# Patient Record
Sex: Female | Born: 1940 | Race: Black or African American | Hispanic: No | Smoking: Never smoker
Health system: Southern US, Community
[De-identification: ages and names within clinical notes are randomized; demographics above are authoritative.]

## PROBLEM LIST (undated history)

## (undated) ENCOUNTER — Ambulatory Visit (HOSPITAL_COMMUNITY): Admission: EM | Payer: Medicare HMO

## (undated) DIAGNOSIS — I4891 Unspecified atrial fibrillation: Secondary | ICD-10-CM

## (undated) DIAGNOSIS — R413 Other amnesia: Secondary | ICD-10-CM

## (undated) DIAGNOSIS — N281 Cyst of kidney, acquired: Secondary | ICD-10-CM

## (undated) DIAGNOSIS — F419 Anxiety disorder, unspecified: Secondary | ICD-10-CM

## (undated) DIAGNOSIS — I10 Essential (primary) hypertension: Secondary | ICD-10-CM

## (undated) DIAGNOSIS — Z0000 Encounter for general adult medical examination without abnormal findings: Secondary | ICD-10-CM

## (undated) DIAGNOSIS — F39 Unspecified mood [affective] disorder: Secondary | ICD-10-CM

## (undated) DIAGNOSIS — E785 Hyperlipidemia, unspecified: Secondary | ICD-10-CM

## (undated) HISTORY — DX: Encounter for general adult medical examination without abnormal findings: Z00.00

## (undated) HISTORY — DX: Essential (primary) hypertension: I10

## (undated) HISTORY — DX: Hyperlipidemia, unspecified: E78.5

## (undated) HISTORY — PX: ABDOMINAL HYSTERECTOMY: SHX81

## (undated) HISTORY — DX: Unspecified atrial fibrillation: I48.91

## (undated) HISTORY — DX: Anxiety disorder, unspecified: F41.9

## (undated) HISTORY — DX: Cyst of kidney, acquired: N28.1

## (undated) HISTORY — DX: Other amnesia: R41.3

## (undated) HISTORY — DX: Unspecified mood (affective) disorder: F39

---

## 1998-09-07 ENCOUNTER — Inpatient Hospital Stay (HOSPITAL_COMMUNITY): Admission: AD | Admit: 1998-09-07 | Discharge: 1998-09-07 | Payer: Self-pay | Admitting: Internal Medicine

## 1998-10-24 ENCOUNTER — Ambulatory Visit (HOSPITAL_COMMUNITY): Admission: RE | Admit: 1998-10-24 | Discharge: 1998-10-24 | Payer: Self-pay | Admitting: Obstetrics and Gynecology

## 1998-10-24 ENCOUNTER — Encounter: Payer: Self-pay | Admitting: Obstetrics and Gynecology

## 1999-07-25 ENCOUNTER — Ambulatory Visit (HOSPITAL_COMMUNITY): Admission: RE | Admit: 1999-07-25 | Discharge: 1999-07-25 | Payer: Self-pay | Admitting: Cardiology

## 1999-07-31 ENCOUNTER — Encounter: Payer: Self-pay | Admitting: Cardiology

## 1999-07-31 ENCOUNTER — Ambulatory Visit (HOSPITAL_COMMUNITY): Admission: RE | Admit: 1999-07-31 | Discharge: 1999-07-31 | Payer: Self-pay | Admitting: Cardiology

## 2000-03-14 ENCOUNTER — Ambulatory Visit (HOSPITAL_COMMUNITY): Admission: RE | Admit: 2000-03-14 | Discharge: 2000-03-14 | Payer: Self-pay | Admitting: Cardiology

## 2000-07-31 ENCOUNTER — Other Ambulatory Visit: Admission: RE | Admit: 2000-07-31 | Discharge: 2000-07-31 | Payer: Self-pay | Admitting: Obstetrics and Gynecology

## 2000-08-06 ENCOUNTER — Encounter: Payer: Self-pay | Admitting: Internal Medicine

## 2000-08-06 ENCOUNTER — Encounter: Admission: RE | Admit: 2000-08-06 | Discharge: 2000-08-06 | Payer: Self-pay | Admitting: Internal Medicine

## 2001-01-28 ENCOUNTER — Encounter: Payer: Self-pay | Admitting: Internal Medicine

## 2001-01-28 ENCOUNTER — Encounter: Admission: RE | Admit: 2001-01-28 | Discharge: 2001-01-28 | Payer: Self-pay | Admitting: Internal Medicine

## 2001-02-28 ENCOUNTER — Inpatient Hospital Stay (HOSPITAL_COMMUNITY): Admission: AD | Admit: 2001-02-28 | Discharge: 2001-03-05 | Payer: Self-pay | Admitting: Internal Medicine

## 2001-03-10 ENCOUNTER — Encounter: Admission: RE | Admit: 2001-03-10 | Discharge: 2001-06-08 | Payer: Self-pay | Admitting: Internal Medicine

## 2001-04-30 ENCOUNTER — Ambulatory Visit (HOSPITAL_COMMUNITY): Admission: RE | Admit: 2001-04-30 | Discharge: 2001-04-30 | Payer: Self-pay | Admitting: Internal Medicine

## 2001-04-30 ENCOUNTER — Encounter: Payer: Self-pay | Admitting: Internal Medicine

## 2001-05-13 ENCOUNTER — Ambulatory Visit (HOSPITAL_COMMUNITY): Admission: RE | Admit: 2001-05-13 | Discharge: 2001-05-13 | Payer: Self-pay | Admitting: Internal Medicine

## 2001-05-13 ENCOUNTER — Encounter: Payer: Self-pay | Admitting: Internal Medicine

## 2001-07-28 ENCOUNTER — Encounter: Admission: RE | Admit: 2001-07-28 | Discharge: 2001-10-26 | Payer: Self-pay | Admitting: Internal Medicine

## 2001-10-09 ENCOUNTER — Other Ambulatory Visit: Admission: RE | Admit: 2001-10-09 | Discharge: 2001-10-09 | Payer: Self-pay | Admitting: Obstetrics and Gynecology

## 2001-12-29 ENCOUNTER — Encounter: Admission: RE | Admit: 2001-12-29 | Discharge: 2002-03-29 | Payer: Self-pay | Admitting: Internal Medicine

## 2002-02-13 ENCOUNTER — Encounter: Payer: Self-pay | Admitting: Emergency Medicine

## 2002-02-13 ENCOUNTER — Emergency Department (HOSPITAL_COMMUNITY): Admission: EM | Admit: 2002-02-13 | Discharge: 2002-02-13 | Payer: Self-pay | Admitting: Emergency Medicine

## 2002-04-20 ENCOUNTER — Encounter: Admission: RE | Admit: 2002-04-20 | Discharge: 2002-07-19 | Payer: Self-pay | Admitting: Internal Medicine

## 2003-01-06 ENCOUNTER — Encounter: Payer: Self-pay | Admitting: Internal Medicine

## 2003-01-06 ENCOUNTER — Encounter: Admission: RE | Admit: 2003-01-06 | Discharge: 2003-01-06 | Payer: Self-pay | Admitting: Internal Medicine

## 2003-03-02 ENCOUNTER — Other Ambulatory Visit: Admission: RE | Admit: 2003-03-02 | Discharge: 2003-03-02 | Payer: Self-pay | Admitting: Obstetrics and Gynecology

## 2003-04-22 ENCOUNTER — Encounter: Admission: RE | Admit: 2003-04-22 | Discharge: 2003-04-22 | Payer: Self-pay | Admitting: Internal Medicine

## 2003-04-22 ENCOUNTER — Encounter: Payer: Self-pay | Admitting: Internal Medicine

## 2003-05-19 ENCOUNTER — Emergency Department (HOSPITAL_COMMUNITY): Admission: EM | Admit: 2003-05-19 | Discharge: 2003-05-19 | Payer: Self-pay | Admitting: Emergency Medicine

## 2003-05-20 ENCOUNTER — Inpatient Hospital Stay (HOSPITAL_COMMUNITY): Admission: AD | Admit: 2003-05-20 | Discharge: 2003-05-25 | Payer: Self-pay | Admitting: Internal Medicine

## 2003-05-20 ENCOUNTER — Encounter: Payer: Self-pay | Admitting: Internal Medicine

## 2003-05-22 ENCOUNTER — Encounter: Payer: Self-pay | Admitting: Internal Medicine

## 2003-05-22 ENCOUNTER — Encounter: Payer: Self-pay | Admitting: Gastroenterology

## 2003-05-23 ENCOUNTER — Encounter: Payer: Self-pay | Admitting: Internal Medicine

## 2003-07-22 ENCOUNTER — Ambulatory Visit (HOSPITAL_COMMUNITY): Admission: RE | Admit: 2003-07-22 | Discharge: 2003-07-22 | Payer: Self-pay | Admitting: Gastroenterology

## 2004-03-07 ENCOUNTER — Encounter: Admission: RE | Admit: 2004-03-07 | Discharge: 2004-03-07 | Payer: Self-pay | Admitting: Internal Medicine

## 2004-03-23 ENCOUNTER — Other Ambulatory Visit: Admission: RE | Admit: 2004-03-23 | Discharge: 2004-03-23 | Payer: Self-pay | Admitting: Obstetrics and Gynecology

## 2005-04-18 ENCOUNTER — Ambulatory Visit (HOSPITAL_COMMUNITY): Admission: RE | Admit: 2005-04-18 | Discharge: 2005-04-18 | Payer: Self-pay | Admitting: Obstetrics and Gynecology

## 2005-06-21 ENCOUNTER — Other Ambulatory Visit: Admission: RE | Admit: 2005-06-21 | Discharge: 2005-06-21 | Payer: Self-pay | Admitting: Obstetrics and Gynecology

## 2005-06-25 ENCOUNTER — Emergency Department (HOSPITAL_COMMUNITY): Admission: EM | Admit: 2005-06-25 | Discharge: 2005-06-25 | Payer: Self-pay | Admitting: Emergency Medicine

## 2005-08-28 ENCOUNTER — Emergency Department (HOSPITAL_COMMUNITY): Admission: EM | Admit: 2005-08-28 | Discharge: 2005-08-28 | Payer: Self-pay | Admitting: Emergency Medicine

## 2005-10-10 ENCOUNTER — Encounter: Admission: RE | Admit: 2005-10-10 | Discharge: 2006-01-08 | Payer: Self-pay | Admitting: Sports Medicine

## 2005-10-13 ENCOUNTER — Ambulatory Visit (HOSPITAL_COMMUNITY): Admission: RE | Admit: 2005-10-13 | Discharge: 2005-10-13 | Payer: Self-pay | Admitting: Sports Medicine

## 2006-02-04 ENCOUNTER — Encounter: Admission: RE | Admit: 2006-02-04 | Discharge: 2006-02-04 | Payer: Self-pay | Admitting: Sports Medicine

## 2006-02-10 ENCOUNTER — Emergency Department (HOSPITAL_COMMUNITY): Admission: EM | Admit: 2006-02-10 | Discharge: 2006-02-10 | Payer: Self-pay | Admitting: Emergency Medicine

## 2006-07-25 ENCOUNTER — Emergency Department (HOSPITAL_COMMUNITY): Admission: EM | Admit: 2006-07-25 | Discharge: 2006-07-25 | Payer: Self-pay | Admitting: Family Medicine

## 2006-08-04 ENCOUNTER — Emergency Department (HOSPITAL_COMMUNITY): Admission: EM | Admit: 2006-08-04 | Discharge: 2006-08-04 | Payer: Self-pay | Admitting: Emergency Medicine

## 2006-10-22 DIAGNOSIS — Z0000 Encounter for general adult medical examination without abnormal findings: Secondary | ICD-10-CM

## 2006-10-22 HISTORY — DX: Encounter for general adult medical examination without abnormal findings: Z00.00

## 2006-11-10 ENCOUNTER — Emergency Department (HOSPITAL_COMMUNITY): Admission: EM | Admit: 2006-11-10 | Discharge: 2006-11-10 | Payer: Self-pay | Admitting: Emergency Medicine

## 2007-02-08 ENCOUNTER — Emergency Department (HOSPITAL_COMMUNITY): Admission: EM | Admit: 2007-02-08 | Discharge: 2007-02-08 | Payer: Self-pay | Admitting: *Deleted

## 2007-04-28 ENCOUNTER — Emergency Department (HOSPITAL_COMMUNITY): Admission: EM | Admit: 2007-04-28 | Discharge: 2007-04-28 | Payer: Self-pay | Admitting: Family Medicine

## 2007-05-01 ENCOUNTER — Emergency Department (HOSPITAL_COMMUNITY): Admission: EM | Admit: 2007-05-01 | Discharge: 2007-05-01 | Payer: Self-pay | Admitting: Emergency Medicine

## 2007-06-17 ENCOUNTER — Emergency Department (HOSPITAL_COMMUNITY): Admission: EM | Admit: 2007-06-17 | Discharge: 2007-06-17 | Payer: Self-pay | Admitting: Family Medicine

## 2007-07-09 ENCOUNTER — Emergency Department (HOSPITAL_COMMUNITY): Admission: EM | Admit: 2007-07-09 | Discharge: 2007-07-09 | Payer: Self-pay | Admitting: Internal Medicine

## 2007-07-30 ENCOUNTER — Emergency Department (HOSPITAL_COMMUNITY): Admission: EM | Admit: 2007-07-30 | Discharge: 2007-07-30 | Payer: Self-pay | Admitting: Family Medicine

## 2007-10-16 ENCOUNTER — Inpatient Hospital Stay (HOSPITAL_BASED_OUTPATIENT_CLINIC_OR_DEPARTMENT_OTHER): Admission: RE | Admit: 2007-10-16 | Discharge: 2007-10-16 | Payer: Self-pay | Admitting: Cardiology

## 2007-11-10 LAB — HM MAMMOGRAPHY

## 2008-01-02 ENCOUNTER — Encounter: Admission: RE | Admit: 2008-01-02 | Discharge: 2008-01-02 | Payer: Self-pay | Admitting: Otolaryngology

## 2008-03-06 ENCOUNTER — Emergency Department (HOSPITAL_COMMUNITY): Admission: EM | Admit: 2008-03-06 | Discharge: 2008-03-06 | Payer: Self-pay | Admitting: Family Medicine

## 2008-03-23 ENCOUNTER — Encounter: Admission: RE | Admit: 2008-03-23 | Discharge: 2008-03-23 | Payer: Self-pay | Admitting: Internal Medicine

## 2008-06-06 ENCOUNTER — Emergency Department (HOSPITAL_COMMUNITY): Admission: EM | Admit: 2008-06-06 | Discharge: 2008-06-06 | Payer: Self-pay | Admitting: Emergency Medicine

## 2008-07-23 ENCOUNTER — Emergency Department (HOSPITAL_COMMUNITY): Admission: EM | Admit: 2008-07-23 | Discharge: 2008-07-23 | Payer: Self-pay | Admitting: Emergency Medicine

## 2008-08-13 ENCOUNTER — Ambulatory Visit (HOSPITAL_COMMUNITY): Admission: RE | Admit: 2008-08-13 | Discharge: 2008-08-13 | Payer: Self-pay | Admitting: Cardiology

## 2008-11-09 ENCOUNTER — Emergency Department (HOSPITAL_COMMUNITY): Admission: EM | Admit: 2008-11-09 | Discharge: 2008-11-09 | Payer: Self-pay | Admitting: Emergency Medicine

## 2009-03-13 ENCOUNTER — Emergency Department (HOSPITAL_COMMUNITY): Admission: EM | Admit: 2009-03-13 | Discharge: 2009-03-13 | Payer: Self-pay | Admitting: Emergency Medicine

## 2010-01-05 ENCOUNTER — Emergency Department (HOSPITAL_COMMUNITY): Admission: EM | Admit: 2010-01-05 | Discharge: 2010-01-05 | Payer: Self-pay | Admitting: Family Medicine

## 2010-01-05 ENCOUNTER — Emergency Department (HOSPITAL_COMMUNITY): Admission: EM | Admit: 2010-01-05 | Discharge: 2010-01-05 | Payer: Self-pay | Admitting: Emergency Medicine

## 2010-02-20 ENCOUNTER — Observation Stay (HOSPITAL_COMMUNITY): Admission: EM | Admit: 2010-02-20 | Discharge: 2010-02-20 | Payer: Self-pay | Admitting: Emergency Medicine

## 2010-05-20 ENCOUNTER — Emergency Department (HOSPITAL_COMMUNITY): Admission: EM | Admit: 2010-05-20 | Discharge: 2010-05-20 | Payer: Self-pay | Admitting: Emergency Medicine

## 2010-07-22 ENCOUNTER — Emergency Department (HOSPITAL_COMMUNITY): Admission: EM | Admit: 2010-07-22 | Discharge: 2010-07-22 | Payer: Self-pay | Admitting: Emergency Medicine

## 2010-11-25 ENCOUNTER — Emergency Department (HOSPITAL_COMMUNITY)
Admission: EM | Admit: 2010-11-25 | Discharge: 2010-11-25 | Payer: Self-pay | Source: Home / Self Care | Admitting: Emergency Medicine

## 2011-02-01 ENCOUNTER — Emergency Department (HOSPITAL_COMMUNITY): Payer: Medicare Other

## 2011-02-01 ENCOUNTER — Emergency Department (HOSPITAL_COMMUNITY)
Admission: EM | Admit: 2011-02-01 | Discharge: 2011-02-01 | Disposition: A | Discharge status: Home / Self Care | Payer: Medicare Other | Attending: Emergency Medicine | Admitting: Emergency Medicine

## 2011-02-01 DIAGNOSIS — M67919 Unspecified disorder of synovium and tendon, unspecified shoulder: Secondary | ICD-10-CM | POA: Insufficient documentation

## 2011-02-01 DIAGNOSIS — M25519 Pain in unspecified shoulder: Secondary | ICD-10-CM | POA: Insufficient documentation

## 2011-02-01 DIAGNOSIS — E119 Type 2 diabetes mellitus without complications: Secondary | ICD-10-CM | POA: Insufficient documentation

## 2011-02-01 DIAGNOSIS — F3289 Other specified depressive episodes: Secondary | ICD-10-CM | POA: Insufficient documentation

## 2011-02-01 DIAGNOSIS — F329 Major depressive disorder, single episode, unspecified: Secondary | ICD-10-CM | POA: Insufficient documentation

## 2011-02-01 DIAGNOSIS — I10 Essential (primary) hypertension: Secondary | ICD-10-CM | POA: Insufficient documentation

## 2011-02-01 DIAGNOSIS — M719 Bursopathy, unspecified: Secondary | ICD-10-CM | POA: Insufficient documentation

## 2011-02-01 DIAGNOSIS — E785 Hyperlipidemia, unspecified: Secondary | ICD-10-CM | POA: Insufficient documentation

## 2011-02-26 LAB — RAPID STREP SCREEN (MED CTR MEBANE ONLY): Streptococcus, Group A Screen (Direct): NEGATIVE

## 2011-02-26 LAB — GLUCOSE, CAPILLARY: Glucose-Capillary: 121 mg/dL — ABNORMAL HIGH (ref 70–99)

## 2011-03-02 LAB — POCT URINALYSIS DIPSTICK
Bilirubin Urine: NEGATIVE
Glucose, UA: NEGATIVE mg/dL
Hgb urine dipstick: NEGATIVE
Nitrite: NEGATIVE
Urobilinogen, UA: 0.2 mg/dL (ref 0.0–1.0)

## 2011-03-02 LAB — URINE CULTURE: Culture  Setup Time: 201108062024

## 2011-03-05 LAB — DIFFERENTIAL
Basophils Relative: 0 % (ref 0–1)
Eosinophils Absolute: 0 10*3/uL (ref 0.0–0.7)
Eosinophils Relative: 0 % (ref 0–5)
Lymphs Abs: 2 10*3/uL (ref 0.7–4.0)
Monocytes Relative: 12 % (ref 3–12)

## 2011-03-05 LAB — BASIC METABOLIC PANEL
BUN: 7 mg/dL (ref 6–23)
CO2: 25 mEq/L (ref 19–32)
Chloride: 105 mEq/L (ref 96–112)
Potassium: 3.6 mEq/L (ref 3.5–5.1)

## 2011-03-05 LAB — GLUCOSE, CAPILLARY: Glucose-Capillary: 100 mg/dL — ABNORMAL HIGH (ref 70–99)

## 2011-03-05 LAB — CBC
HCT: 39.6 % (ref 36.0–46.0)
MCHC: 33.4 g/dL (ref 30.0–36.0)
MCV: 97.4 fL (ref 78.0–100.0)
Platelets: 299 10*3/uL (ref 150–400)
RBC: 4.06 MIL/uL (ref 3.87–5.11)
WBC: 5.6 10*3/uL (ref 4.0–10.5)

## 2011-03-05 LAB — POCT CARDIAC MARKERS: Troponin i, poc: <0.05 ng/mL (ref 0.00–0.09)

## 2011-03-06 ENCOUNTER — Encounter: Payer: Self-pay | Admitting: Internal Medicine

## 2011-03-12 LAB — COMPREHENSIVE METABOLIC PANEL
ALT: 34 U/L (ref 0–35)
AST: 26 U/L (ref 0–37)
CO2: 23 mEq/L (ref 19–32)
Calcium: 9.7 mg/dL (ref 8.4–10.5)
Chloride: 105 mEq/L (ref 96–112)
Creatinine, Ser: 0.81 mg/dL (ref 0.4–1.2)
GFR calc Af Amer: >60 mL/min (ref >60)
GFR calc non Af Amer: >60 mL/min (ref >60)
Glucose, Bld: 113 mg/dL — ABNORMAL HIGH (ref 70–99)
Sodium: 138 mEq/L (ref 135–145)
Total Bilirubin: 0.7 mg/dL (ref 0.3–1.2)

## 2011-03-12 LAB — DIFFERENTIAL
Basophils Absolute: 0 10*3/uL (ref 0.0–0.1)
Eosinophils Absolute: 0 10*3/uL (ref 0.0–0.7)
Eosinophils Relative: 0 % (ref 0–5)
Lymphs Abs: 2 10*3/uL (ref 0.7–4.0)
Neutrophils Relative %: 56 % (ref 43–77)

## 2011-03-12 LAB — CBC
Hemoglobin: 15.3 g/dL — ABNORMAL HIGH (ref 12.0–15.0)
MCHC: 33.8 g/dL (ref 30.0–36.0)
MCV: 97.5 fL (ref 78.0–100.0)
RBC: 4.66 MIL/uL (ref 3.87–5.11)
WBC: 6.3 10*3/uL (ref 4.0–10.5)

## 2011-03-12 LAB — URINALYSIS, ROUTINE W REFLEX MICROSCOPIC
Protein, ur: 30 mg/dL — AB
Urobilinogen, UA: 0.2 mg/dL (ref 0.0–1.0)

## 2011-03-12 LAB — URINE MICROSCOPIC-ADD ON

## 2011-03-12 LAB — URINE CULTURE

## 2011-03-12 LAB — BASIC METABOLIC PANEL
BUN: 9 mg/dL (ref 6–23)
Calcium: 8.2 mg/dL — ABNORMAL LOW (ref 8.4–10.5)
Creatinine, Ser: 0.71 mg/dL (ref 0.4–1.2)
GFR calc Af Amer: >60 mL/min (ref >60)
GFR calc non Af Amer: >60 mL/min (ref >60)

## 2011-03-12 LAB — LIPASE, BLOOD: Lipase: 23 U/L (ref 11–59)

## 2011-03-13 ENCOUNTER — Encounter: Payer: Self-pay | Admitting: Internal Medicine

## 2011-03-28 ENCOUNTER — Encounter: Payer: Self-pay | Admitting: Internal Medicine

## 2011-03-28 DIAGNOSIS — E119 Type 2 diabetes mellitus without complications: Secondary | ICD-10-CM | POA: Insufficient documentation

## 2011-03-28 DIAGNOSIS — I10 Essential (primary) hypertension: Secondary | ICD-10-CM | POA: Insufficient documentation

## 2011-03-28 DIAGNOSIS — F419 Anxiety disorder, unspecified: Secondary | ICD-10-CM | POA: Insufficient documentation

## 2011-03-29 LAB — GLUCOSE, CAPILLARY: Glucose-Capillary: 143 mg/dL — ABNORMAL HIGH (ref 70–99)

## 2011-04-12 ENCOUNTER — Ambulatory Visit (HOSPITAL_COMMUNITY): Payer: Medicare Other

## 2011-05-01 NOTE — Cardiovascular Report (Signed)
NAME:  Jacqueline Ball, Jacqueline Ball NO.:  [id number]   MEDICAL RECORD NO.:  [id number]          PATIENT TYPE:  OIB   LOCATION:  1961                         FACILITY:  [facility]   PHYSICIAN:  Mohan N. Sharyn Lull, M.D. DATE OF BIRTH:  1941-07-04   DATE OF PROCEDURE:  DATE OF DISCHARGE:                            CARDIAC CATHETERIZATION   PROCEDURE:  Left cardiac cath with selective left and right coronary  angiography, LV graft revealed right groin using Judkins technique.   INDICATIONS FOR PROCEDURE:  [name] is a 70 year old black female with  past medical history significant for hypertension, non-insulin-dependent  diabetes mellitus, hypercholesteremia, complains of retrosternal chest  pressure, tight, localized associated with feeling weak and tired.  Denies any nausea, vomiting, diaphoresis.  Denies palpitation,  lightheadedness or syncope.  Denies exertional chest pain, but complains  of exertional dyspnea.  EKG done in the office showed normal sinus  rhythm with T-wave inversion in anterolateral leads which was new as  compared to prior EKG.  The patient denies any relation of chest pain to  food, breathing or movement.   PAST MEDICAL HISTORY:  As above.   PAST SURGICAL HISTORY:  She had hysterectomy in the past.   ALLERGIES:  NO KNOWN DRUG ALLERGIES.   MEDICATIONS:  At home she is on:  1. Lisinopril 10 mg p.o. daily.  2. Simvastatin 80 mg p.o. daily.  3. Avandamet 03/999 p.o. daily.  4. Nexium 40 mg p.o. daily.  5. Lorazepam 1 mg t.i.d.  6. Lexapro 20 mg p.o. daily.   SOCIAL HISTORY:  She is married and has 2 children.  No history of  smoking or alcohol abuse.   FAMILY HISTORY:  Father died of kidney failure.  Mother is alive.  She  is 78 and in good health.  Two brothers and one sister in good health.   PHYSICAL EXAMINATION:  GENERAL:  She is alert and oriented x 3 in no  acute distress.  VITAL SIGNS:  Blood pressure was 130/70, pulse was 66 and regular.  HEENT:  Conjunctivae pink.  NECK:  Supple.  No JVD, no bruit.  LUNGS:  Clear to auscultation without rhonchi or rales.  CARDIOVASCULAR:  S1-S2 was normal.  There is a soft systolic murmur.  There was no S3 or S4 gallop.  ABDOMEN:  Soft.  Bowel sounds were present, nontender.  EXTREMITIES:  There is no clubbing, cyanosis or edema.   IMPRESSION:  Chest pain, abnormal electrocardiogram, rule out coronary  insufficiency, hypertension, diabetes mellitus, hypercholesteremia.  Discussed with the patient regarding noninvasive stress testing versus  left cath, its risks and benefits, i.e. death, MI, stroke, need for  emergency CABG, local vascular complications, etc. and consented for the  procedure.   PROCEDURE:  After obtaining the informed consent, the patient was  brought to the cath lab and was placed on fluoroscopy table.  Right  groin was prepped and draped in usual fashion.  Xylocaine 2% was used  for local anesthesia in the right groin.  With the help of thin-wall  needle, a 4-French arterial sheath was placed.  The sheath  was aspirated  and flushed.  Next, 4-French left Judkins catheter was advanced over the  wire under fluoroscopic guidance up to the ascending aorta.  Wire was  pulled out.  The catheter was aspirated and connected to the manifold.  Catheter was further advanced and engaged into left coronary ostium.  Multiple views of the left system were taken.  Next, the catheter was  disengaged and was pulled out over the wire and was replaced with 4-  [place] 3-D diagnostic right catheter which was advanced over the wire  under fluoroscopic guidance up to the ascending aorta.  Wire was pulled  out.  The catheter was aspirated and connected to the manifold.  Catheter was further advanced and engaged into right coronary ostium.  Multiple views of the right system were taken.  Next, the catheter was  disengaged and was pulled out over the wire and was replaced with 4-  [place]  pigtail catheter which was advanced over the wire under  fluoroscopic guidance up to the ascending aorta.  Wire was pulled out.  The catheter was aspirated and connected to the manifold.  Catheter was  further advanced across the aortic valve into the LV.  LV pressures were  recorded.  Next, LV graft was done in 30 degree RAO position.  Post-  angiographic pressures were recorded from LV and then pullback pressures  were recorded from the aorta.  There was no gradient across aortic  valve.  Next, pigtail catheter was pulled out over the wire.  Sheaths  were aspirated and flushed.   FINDINGS:  LV showed good LV systolic function, LVH EF of 55-60%, left  main was patent.  LAD has 5-10% proximal stenosis and then it diffusely  tapers down at the apex.  Diagonal I and II were very small.  Left  circumflex has 5-10% proximal stenosis.  OM1 was very, very small.  OM2  concerning.  OM1 was small which was patent.  OM2 was very small OM III  was small which was patent.  RCA was patent.  PDA and PLV branches were  also patent.  The patient tolerated the procedure well.  There are no  complications.  The patient was transferred to recovery room in stable  condition.      Eduardo Osier. Sharyn Lull, M.D.  Electronically Signed     MNH/MEDQ  D:  10/16/2007  T:  10/16/2007  Job:  [id number]   cc:   Eduardo Osier. Sharyn Lull, M.D.

## 2011-05-04 NOTE — Discharge Summary (Signed)
NAME:  Jacqueline Ball, Jacqueline Ball NO.:  [id number]   MEDICAL RECORD NO.:  [id number]                   PATIENT TYPE:  INP   LOCATION:  0350                                 FACILITY:  [facility]   PHYSICIAN:  Eric L. August Saucer, M.D.                  DATE OF BIRTH:  Apr 23, 1941   DATE OF ADMISSION:  05/20/2003  DATE OF DISCHARGE:  05/25/2003                                 DISCHARGE SUMMARY   FINAL DIAGNOSES:  1. Abdominal pain, left upper quadrant (789.02).  2. Diabetes mellitus type 2 (250.00).  3. Depressive disorder (311).  4. Diverticulosis of the colon without hemorrhage (562.10).  5. Liver disorder, nonspecific (573.8).  6. Cyst of the kidney, acquired (593.2).  7. Nausea with vomiting (787.01).  8. Neuralgia (729.2).  9. Other malaise and fatigue (780.9).  10.      Lumbosacral spondylosis (721.3).  11.      Status post hysterectomy (B45.77).   OPERATIONS AND PROCEDURES:  None.   HISTORY OF PRESENT ILLNESS:  First recent [facility] admission for  this 70 year old married black female with a history of diabetes mellitus  and depression.  Over the past few months she has had intermittent vague  abdominal symptoms.  They were not associated with eating or activity.  There has been no significant change in her bowel habits.  Over the past  week, however, she noted increasing lower abdominal pain.  She associated  this with a fullness.  She denies difficulty voiding at the time.  For the  several days prior to admission she had noted intermittent gastric to left  upper quadrant pain.  One day prior to admission she developed episodic  nausea and vomiting.  The patient felt extremely weak following the events  and was unable to ambulate.  The patient subsequently went to [facility]  Emergency Room one day prior to admission.  Examination by EDP was  unremarkable.  No abdominal films were obtained.  No amylase was obtained as  well.  This patient was  discharged home feeling better after being given  metoclopramide for nausea.  On the day of admission she noted recurring  agastric, epigastric discomfort.  This occurred after eating baked fish.  Because of progression of symptoms she is subsequently admitted for further  evaluation.   PAST MEDICAL HISTORY:  Per admission H&P.   PHYSICAL EXAMINATION:  Per admission H&P.   HOSPITAL COURSE:  The patient was admitted for further evaluation of  intermittent abdominal pain which had been poorly described, but appeared  more localized in the left upper quadrant.  Thus concern for possible  pancreatitis versus other.  She had history of diabetes mellitus with  __________ control as well.  The patient was admitted for further evaluation  of abdominal pain.  She was initially placed at bowel rest with sips and  clear liquids only.  She was given IV fluids for hydration as well.  A CT  scan of the abdomen was obtained which showed no evidence for discreet  lesions.  The patient was seen in consultation by Fayrene Fearing L. Randa Evens, M.D. of  gastroenterology.  The patient was noted to be an extremely poor historian  with fluctuating histories.  A gallbladder thereafter was ordered which  showed no evidence of stones, normal gallbladder caliber.  X-rays of the  area did demonstrate persistent degenerative joint disease more at L5-S1.  It is felt that this may also be contributing to patient's abdominal pain.  Further evaluation of this was pursued with an MRI scan which showed  degenerative disk disease.  No HNP or spinal stenosis.   Abdominal ultrasound was noted was unremarkable.  Plain films of abdomen  were unremarkable as well.  It was recommended after evaluation by GI the  patient began repeating on a gradual basis.  The patient tolerated her diet  well.  During the course of the stay it was clear that patient has  experienced significant stress as well.  It was GI's opinion that pain may  well be  functional.  Her medications were maximized during hospital stay.  She tolerated her regimen well.  Arrangements were made thereafter to follow  up on as an outpatient.  She will still need a colonoscopy at a later date.   DISCHARGE MEDICATIONS:  1. Lexapro 20 mg p.o. daily.  2. Lorazepam 0.5 mg p.o. q.i.d. p.r.n. anxiety.  3. B complex vitamins daily.  4. Zocor 40 mg daily.  5. Avandamet 2/500 mg one b.i.d.  6. Nexium 40 mg daily.   DISCHARGE INSTRUCTIONS:  She will be maintained on a low fat, carbohydrate  modified diet.  She will follow up in the office in two weeks' time.  Consider followup with Dr. Moses Manners for outpatient colonoscopy as well.                                               Eric L. August Saucer, M.D.    ELD/MEDQ  D:  06/23/2003  T:  06/24/2003  Job:  [id number]
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
## 2011-05-04 NOTE — Cardiovascular Report (Signed)
[facility]  Patient:    Jacqueline Ball, Jacqueline Ball                        MRN: [id number] Proc. Date: 03/14/00 Adm. Date:  [id number] Disc. Date: [id number] Attending:  Robynn Pane CC:         Lind Guest August Saucer, M.D.             Cardiac Catheterization Lab                        Cardiac Catheterization  PROCEDURE:  Left cardiac catheterization, selective left and right coronary angiography, left ventriculography via right groin using Judkins technique.  INDICATIONS FOR PROCEDURE:  Jacqueline Ball is a 70 year old black female with past medical history significant for bronchitis, hypercholesterolemia, complaints of recurrent events of retrosternal chest pressure associated with feeling weak and tired, retrosternal chest pressure while walking in flea market relieved with rest in a few minutes.  Denies any nausea, vomiting, or diaphoresis.  Denies palpation, lightheadedness, or syncope.  EKG showed T wave inversion in the anterolateral leads.  The patient underwent stress Cardiolite in August 2000.  She exercised for 7.21 minutes on Bruce protocol and complained of retrosternal chest pressure at peak of exercise.  Peak heart rate achieved was 158, and peak blood pressure was 180/100.  EKG showed 1 mm ST depression downsloping and horizontal, especially in inferolateral leads in recovery phase, but Cardiolite scan showed no evidence of ischemia.  The patient continued to have recurrent chest pain with minimal exertion.  Due to recurrent chest pain with minimal exertion and positive stress test by EKG criteria and chest pain, the patient was advised for left catheterization and possible angioplasty.  DESCRIPTION OF PROCEDURE:  After obtaining the informed consent, the patient was brought to the catheterization lab and was placed on fluoroscopy table. The right groin was prepped and draped in the usual fashion.  Xylocaine, 2%, was used for local anesthesia in the  right groin.  With the help of a thin-walled needle, a 6-French arterial sheath was placed.  The sheath was aspirated and flushed.  Next, a 6-French left Judkins catheter was advanced over the wire under fluoroscopic guidance up to the ascending aorta.  The wire was pulled out.  The catheter was aspirated and connected to the manifold. The catheter was further advanced and engaged into the left coronary ostium. Multiple views of the left system were taken.  Next, the catheter was disengaged and was pulled out over the wire and was replaced with a 6-French right Judkins catheter which was advanced over the wire under fluoroscopic guidance up to the ascending aorta.  The wire was pulled out.  The catheter was aspirated and connected to the manifold.  The catheter was further advanced and engaged into the right coronary ostium.  Multiple views of the right system were taken.  Next, the catheter was disengaged and was pulled out over the wire and was replaced with a 6-French pigtail catheter which was advanced over the wire under fluoroscopic guidance up to the ascending aorta. The catheter was further advanced across the aortic valve into the LV.  LV pressures were recorded.  Next, a left ventriculography was done in 30-degree RAO position.  Post angiography pressures were recorded from LV, and then pullback pressures were recorded from the aorta.  There was no gradient across the aortic valve.  Next, the pigtail catheter was  pulled out over the wire. The sheaths were aspirated and flushed.  Arteriotomy was closed at the end of the procedure using Perclose without any complications.  The patient tolerated the procedure well.  There were no complications.  FINDINGS: 1. The patient has good LV systolic function. 2. The left main was patent. 3. The LAD has minimal plaquing.  Diagonal #1 is small which is patent. 4. The left circumflex is large and is tortuous proximally but is patent.    OM-1  is small but patent. 5. The right coronary artery is patent.  The patient tolerated the procedure well.  PLAN:  Continue present management.  Patient will be discharged home this afternoon if hemodynamically stable. DD:  03/14/00 TD:  03/15/00 Job: [id number]

## 2011-05-04 NOTE — Discharge Summary (Signed)
[place]  Patient:    Jacqueline Ball, Jacqueline Ball                        MRN: [id number] Adm. Date:  [id number] Disc. Date: [id number] Attending:  Gwenyth Bender                           Discharge Summary  FINAL DIAGNOSES: 1. Diabetes mellitus, new onset (250.92). 2. Hypovolemia (276.5). 3. 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 4. Lumbosacral spondylosis (71.3). 5. Chronic sinusitis (473.9).  OPERATIONS/PROCEDURES:  None.  HISTORY OF PRESENT ILLNESS:  This is the first recent [facility] admission for this 70 year old married black female who presented to the office as a walk-in complaining of increasing weakness and dizziness.  The patient had not been feeling well over the past two weeks.  She had noted increasing fatigue with increasing thirst as well.  Her appetite had increased considerably as well.  She noted that she was eating more fried foods and sweets.  One month prior to this admission, she was noted to have early diabetes and was started on Glucophage 500 mg q.d.  She did not return for further education and had not been checking blood sugars as well.  She notably had not changed her dietary habits.  When seen in the office on the day of admission, blood sugar was 359.  The patient was subsequently admitted for further evaluation, therapy, and education.  PAST MEDICAL HISTORY:  As per Admission H&P.  PHYSICAL EXAMINATION:  As per Admission H&P.  HOSPITAL COURSE:  The patient was admitted for further treatment of uncontrolled diabetes mellitus of relatively new onset.  She was started on IV fluids for rehydration.  She was placed on a sliding scale insulin regimen as well.  The patient was started on diabetic diet with intensive diabetic education from a nursing standpoint as well as a diabetic educator.  As her sugars slowly improved, she was maintained on a regimen of Glucophage and Avandia.  The patient was slow to learn to use a glucometer,  but did gradually improve as the days progressed.  Notably, as her blood sugars stabilized, the patient began to feel better.  Her energy improved, and her mental status improved as well.  It was acknowledged that she had been under a great deal of stress associated with depression from multiple issues.  She was placed on Paxil with the dose being increased to 20 mg p.o. q.d.  Notably at the time of admission, she also was found to have a potassium of 3.4.  This was replaced. her hemoglobin A1C at the time of presentation was 10.6 as well.  With stabilization of her diabetes and institution of Paxil, the patient did do considerably better.  She was gradually made more ambulatory despite her complaints of lower back pain on a chronic basis.  She had some transient abdominal bloating.  This, however, did resolve as well.  By March 20, she was feeling considerably better and was felt to be stable for discharge.  It was recognized that she would need further therapy as an outpatient.  Her only other problem at the time of presentation, was that of a sinus infection.  She was placed on Tequin which she tolerated well.  This was much improved at the time of discharge.  MEDICATIONS AT TIME OF DISCHARGE: 1. Tequin 400 mg p.o. q.d. for an additional five days. 2.  Glucophage XR 1000 mg b.i.d. 3. Avandia 4 mg b.i.d. 4. Zocor 20 mg q.d. 5. Paxil 20 mg q.d. 6. Claritin 10 mg q.d. 7. Premarin 0.625 mg q.d. 8. Xanax 0.25 mg t.i.d. p.r.n. 9. The patient will be maintained on a sliding scale with Humulin R for CBGs    greater than 300 12 units, 251-300 9 units, 201-250 6 units, 151-200 3    units.  DISCHARGE INSTRUCTIONS:  She will be maintained on a 4-gram sodium, 1500-calorie ADA diet.  DISCHARGE FOLLOWUP:  She is to return to the office in two weeks time for follow-up. DD:  04/09/01 TD:  04/11/01 Job: [id number]

## 2011-05-04 NOTE — Consult Note (Signed)
NAME:  Jacqueline Ball, Jacqueline Ball NO.:  [id number]   MEDICAL RECORD NO.:  [id number]                   PATIENT TYPE:  INP   LOCATION:  0350                                 FACILITY:  [facility]   PHYSICIAN:  James L. Malon Kindle., M.D.          DATE OF BIRTH:  01-25-1941   DATE OF CONSULTATION:  05/21/2003  DATE OF DISCHARGE:                                   CONSULTATION   REASON FOR CONSULTATION:  Abdominal pain.   HISTORY:  The patient is a 70 year old, married, black female who does have  a history of diabetes and depression.  She notes that for approximately two  months she has been increasingly fatigued and has had bloating.  She points  to her stomach and shows how large her stomach is and that this is not  really her.  That this is due to some sort of a process.  During this time  she has had diarrhea.  She notes that she previously was constipated over  the past year or so.  She has now developed loose stools, has two loose  bowel movements a day which do not seem to effect her discomfort or pain nor  does it seem to effect the size of her abdomen.  She feels full and bloated.  The pain is either in the right upper quadrant, epigastrium or in the left  lower quadrant.  It seems to move to different places.  It is poorly  localized.  Eating sometimes makes it worse but in spite of that she has  actually gained weight over the past two months not lost weight.  She notes  that she does occasionally have loose stools, occasionally has nocturnal  stools.  Generally she has 2-6 bowel movements a day that are semi-soft.  She stated this pain is so severe it doubles her over.  She has had episodic  nausea and vomiting but her main complaints seem to be her profound  weakness, the pain and discomfort in her abdomen radiating to her right hip  and right leg.  She also has nausea but does not usually vomit.   MEDICATIONS ON ADMISSION:  1. Lorazepam 0.5 mg t.i.d.  p.r.n.  2. Lexapro 30 mg daily.  3. Vitamin B complex.  4. Zocor one daily.  5. Avandimet 2/500 b.i.d.  6. Nexium 40 mg daily.  7. Estrogen patch.   Patient denies any drug allergies.   PAST MEDICAL HISTORY:  1. Abdominal pain worked up two years ago with negative ultrasound and CT.     She is known to have hepatic and renal cyst.  2. Non-insulin-dependent diabetes.  3. History of depression, currently being treated with Lexapro, lorazepam.  4. History of hysterectomy.  Her ovaries were left.  5. No chronic medical problems.  6. She did see a doctor in [facility], apparently had an endoscopy and     possibly a  colonoscopy or sigmoid she is not sure about six years ago.     She is not sure of the results of either of these.   SOCIAL HISTORY:  She is married, has been married for 41 years.  She does  not smoke or drink.   REVIEW OF SYSTEMS:  Remarkable for back pain radiating down the right leg  with a bloated abdomen causing increasing pain in her hip and right leg when  she moves.   PHYSICAL EXAMINATION:  VITAL SIGNS:  Patient is afebrile.  Blood pressure  122/74, pulse 72.  GENERAL:  Obese black female in no acute distress.  She appears well  nourished.  EYES:  Sclerae nonicteric.  Extraocular movements intact.  NECK:  Supple.  No lymphadenopathy.  LUNGS:  Clear.  HEART:  Regular rate and rhythm without murmurs or gallops.  ABDOMEN:  Minimally if any distended.  She indicates it is distended but it  does not appear distended to me.  It is soft and doughy with good bowel  sounds.  I do not appreciate any masses.  EXTREMITIES:  She has some negative straight leg raise particularly on the  right which is where she is complaining of pain.  PSYCHIATRIC:  The patient is oriented to time, person and place.  She has  difficulty focusing and describing her symptoms adequately and really tends  to ramble.   PERTINENT DATA:  Labs were slightly increased amylase at 183.  Normal  liver  tests.  Normal lipase.  Normal CBC.  Urinalysis revealed trace blood and  trace leukocyte esterase, 2-6 white cells.   CT of the abdomen showed sigmoid diverticulosis, no acute inflammatory  process.  She  had hepatic and renal cystic disease which was known from  previous scans.  These were all simple appearing cysts.   ASSESSMENT:  Vague abdominal symptoms that appear to be intermittent.  I am  having a great deal of difficulty pinning down a history on this lady.  She  rambles so much with all of her symptoms that it is really difficult to  obtain any meaningful history.  My suspicion is that this is probably  functional based on lack of specific symptoms and extensive tests including  CT and lab work all negative.   RECOMMENDATIONS:  We will go ahead with another gallbladder ultrasound rule  out gallstones since this could account for intermittent GI symptoms.  I  expect this to be negative.  At this point I will feed her.  We may need to  do a barium enema or colonoscopy at some point in the future.                                                James L. Malon Kindle., M.D.    Waldron Session  D:  05/21/2003  T:  05/21/2003  Job:  [id number]   cc:   Minerva Areola L. August Saucer, M.D.  [address] [address]  [place]  [place] [address]  Fax: [phone]

## 2011-05-04 NOTE — Op Note (Signed)
   NAME:  Jacqueline Ball, Jacqueline Ball NO.:  [id number]   MEDICAL RECORD NO.:  [id number]                   PATIENT TYPE:  AMB   LOCATION:  ENDO                                 FACILITY:  [facility]   PHYSICIAN:  James L. Malon Kindle., M.D.          DATE OF BIRTH:  02/27/1941   DATE OF PROCEDURE:  07/22/2003  DATE OF DISCHARGE:                                 OPERATIVE REPORT   PROCEDURE:  Esophagogastroduodenoscopy.   MEDICATIONS GIVEN:  Cetacaine spray, Versed 6 mg IV.   INDICATIONS FOR PROCEDURE:  Vague epigastric pain, upper GI symptoms, and  bloating.   DESCRIPTION OF PROCEDURE:  The procedure was explained to the patient and  consent obtained.  With the patient in the left lateral decubitus position,  the Olympus scope was inserted and advanced.  The stomach was entered, the  duodenum was seen well and was normal.  There was no ulceration or  inflammation.  No abnormalities of the pyloric channel, it was widely  patent.  The antrum and body were normal.  The fundus and cardia were seen  on the retroflex view and were normal.  There was a 2-3 cm hiatal hernia.  The distal and proximal esophagus were endoscopically normal.   ASSESSMENT:  Epigastric pain and bloating, possibly due to diabetic  gastroparesis, possible gastroesophageal reflux disease.   PLAN:  Will continue on the same medicines for now, continue with  colonoscopy as planned.                                               James L. Malon Kindle., M.D.    Waldron Session  D:  07/22/2003  T:  07/22/2003  Job:  [id number]   cc:   Minerva Areola L. August Saucer, M.D.  [address] [address]  [place]  [place] [address]  Fax: [phone]

## 2011-05-04 NOTE — Op Note (Signed)
   NAME:  Jacqueline Ball, Jacqueline Ball NO.:  [id number]   MEDICAL RECORD NO.:  [id number]                   PATIENT TYPE:  AMB   LOCATION:  ENDO                                 FACILITY:  [facility]   PHYSICIAN:  James L. Malon Kindle., M.D.          DATE OF BIRTH:  Oct 27, 1941   DATE OF PROCEDURE:  07/22/2003  DATE OF DISCHARGE:                                 OPERATIVE REPORT   PROCEDURE:  Colonoscopy.   MEDICATIONS GIVEN:  Fentanyl 60 mcg, Versed 6 mg IV for both procedures.   INDICATIONS FOR PROCEDURE:  Bloating, abdominal pain, no previous history of  colon cancer screening.   DESCRIPTION OF PROCEDURE:  The procedure was explained and patient consent  obtained.  With the patient in the left lateral decubitus position, the  Olympus scope was inserted and advanced.  The prep was excellent.  Using  abdominal pressure and position change, we were able to advance to just  above the ileocecal valve.  The scope was withdrawn in the cecum seen in the  distance, ascending colon, hepatic flexure, transverse colon, splenic  flexure, descending and sigmoid colon were seen well.  A few diverticula  were seen in the sigmoid colon, no other lesions were seen.  The scope was  withdrawn in the rectum and the rectum was free of any polyps or other  abnormalities.  The patient tolerated the procedure well.   ASSESSMENT:  1. No evidence of polyps.  2. Scattered diverticulosis.   PLAN:  Will see the patient back in the office in 4-6 weeks, may consider a  trial of Zelnorm.                                               James L. Malon Kindle., M.D.    Waldron Session  D:  07/22/2003  T:  07/22/2003  Job:  [id number]   cc:   Minerva Areola L. August Saucer, M.D.  [address] [address]  [place]  [place] [address]  Fax: [phone]

## 2011-05-04 NOTE — H&P (Signed)
[place]  Patient:    Jacqueline Ball, Jacqueline Ball                       MRN: [id number] Adm. Date:  02/28/01 Attending:  Minerva Areola L. August Saucer, M.D.                         History and Physical  CHIEF COMPLAINT: Increased blood sugars, progressive weakness.  HISTORY OF PRESENT ILLNESS: This is the first recent [place] admission for this 70 year old married black female, who presented to the office as a walk-in patient complaining of increasing weakness and dizziness.  She states she had not been feeling well over the past two weeks. She had noted increasing fatigue with increasing thirst as well.  Her appetite had increased considerably.  She had noted she was eating more fried foods and sweets.  Approximately one month ago the patient was noted to have early diabetes mellitus and was started on Glucophage 500 mg q.d.  The patient did not return for further education and she had not been checking blood sugars as well.  She notably had not changed her dietary habits.  Today in the office she was noted to have a blood sugar of 359.  The patient was subsequently admitted for further evaluation, therapy, and education.  PAST MEDICAL/SURGICAL HISTORY:  1. Recent anxiety disorder with associated depression.  2. She notably had had problems with low back pain which has been recurrent     over the past year as well.  3. She notably had been under increased stress associated with change in job     situations.  4. Hysterectomy in the past.  5. She has undergone cardiac catheterization, which showed no significant     coronary artery disease.  6. She has had problems with sinus infections in the past, presently not     active.  7. Esophageal dilatation in the past.  REVIEW OF SYSTEMS: As noted above.  She has had intermittent blurred vision and dryness of the mouth.  Transient problems with urinary frequency.  She notably has had recurrent vaginal  infections treated by a gynecologist, type unknown, occurring twice over the past two months.  SOCIAL HISTORY: She denies smoking.  Occasional alcohol intake.  ALLERGIES: No known drug allergies.  CURRENT MEDICATIONS:  1. Zocor 20 mg q.d.  2. Paxil 10 mg q.d.  3. Xanax 0.25 mg t.i.d. p.r.n.  4. Ortho-Est 0.625 mg q.d.  5. Tylenol Extra Strength p.r.n. pain.  PHYSICAL EXAMINATION:  GENERAL: She is a well-developed, well-nourished black female appearing weak, though in no acute distress.  VITAL SIGNS: Height 5 feet 5 inches.  Weight 71.3 kg.  Blood pressure 131/64, pulse 72, respiratory rate 20, temperature 97.7 degrees.  HEENT: Head normocephalic, atraumatic, without bruits.  EOMI.  Fundi grade 1. Mild left maxillary sinus tenderness.  Mild turbinate edema bilaterally.  TMs clear.  Throat shows good dental repair.  Posterior pharynx clear.  NECK: Supple, without enlarged thyroid.  No posterior cervical nodes.  LUNGS: Clear, without wheezes, rales or rhonchi.  CARDIOVASCULAR: Normal S1 and S2.  No S3, no S4.  No murmurs or rubs.  ABDOMEN: Bowel sounds present.  No enlarged liver or spleen.  No masses or tenderness.  MUSCULOSKELETAL: Full range of motion of upper extremities.  Minimal tenderness of lumbosacral spine.  Mild left flank tenderness as well. Crepitus of knees.  Negative Homans.  No edema.  SKIN: Without active lesions.  NEUROLOGIC: Alert and oriented x 3.  Cranial nerves 2-12 intact.  Psychiatric testing shows she is anxious with mild depression.  LABORATORY DATA: Laboratory data is pending.  CBG was 306.  IMPRESSION:  1. Diabetes mellitus, uncontrolled.  2. Mild volume depletion.  3. Rule out occult infection.  4. History of depression with associated anxiety.  5. 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  PLAN: The patient is admitted for further control of diabetes.  She will be placed on IV fluids for mild dehydration and comprehensive  laboratory work will be ordered.  She will be placed on sliding-scale insulin regimen as well. We will increase Glucophage XR and start her on Avandia as well.  We will monitor response closely, assessing need for insulin therapy.  Patient education will be given as well as obtaining a glucometer with instructions. Further therapy pending response to above.  Will also evaluation for possible sinus infection as well. DD:  02/28/01 TD:  03/01/01 Job: [id number]

## 2011-05-14 ENCOUNTER — Emergency Department (HOSPITAL_COMMUNITY)
Admission: EM | Admit: 2011-05-14 | Discharge: 2011-05-14 | Disposition: A | Discharge status: Home / Self Care | Payer: Medicare Other | Attending: Emergency Medicine | Admitting: Emergency Medicine

## 2011-05-14 DIAGNOSIS — R630 Anorexia: Secondary | ICD-10-CM | POA: Insufficient documentation

## 2011-05-14 DIAGNOSIS — R5381 Other malaise: Secondary | ICD-10-CM | POA: Insufficient documentation

## 2011-05-14 DIAGNOSIS — Z79899 Other long term (current) drug therapy: Secondary | ICD-10-CM | POA: Insufficient documentation

## 2011-05-14 DIAGNOSIS — E785 Hyperlipidemia, unspecified: Secondary | ICD-10-CM | POA: Insufficient documentation

## 2011-05-14 DIAGNOSIS — F329 Major depressive disorder, single episode, unspecified: Secondary | ICD-10-CM | POA: Insufficient documentation

## 2011-05-14 DIAGNOSIS — F3289 Other specified depressive episodes: Secondary | ICD-10-CM | POA: Insufficient documentation

## 2011-05-14 DIAGNOSIS — E119 Type 2 diabetes mellitus without complications: Secondary | ICD-10-CM | POA: Insufficient documentation

## 2011-05-14 DIAGNOSIS — I10 Essential (primary) hypertension: Secondary | ICD-10-CM | POA: Insufficient documentation

## 2011-05-14 LAB — CBC
Hemoglobin: 12.4 g/dL (ref 12.0–15.0)
MCH: 32 pg (ref 26.0–34.0)
MCHC: 34.8 g/dL (ref 30.0–36.0)
RDW: 12.7 % (ref 11.5–15.5)

## 2011-05-14 LAB — LIPASE, BLOOD: Lipase: 32 U/L (ref 11–59)

## 2011-05-14 LAB — COMPREHENSIVE METABOLIC PANEL
AST: 19 U/L (ref 0–37)
CO2: 25 mEq/L (ref 19–32)
Calcium: 8.9 mg/dL (ref 8.4–10.5)
Creatinine, Ser: 0.6 mg/dL (ref 0.4–1.2)
GFR calc Af Amer: >60 mL/min (ref >60)
GFR calc non Af Amer: >60 mL/min (ref >60)
Glucose, Bld: 109 mg/dL — ABNORMAL HIGH (ref 70–99)
Total Protein: 7 g/dL (ref 6.0–8.3)

## 2011-05-14 LAB — URINALYSIS, ROUTINE W REFLEX MICROSCOPIC
Bilirubin Urine: NEGATIVE
Hgb urine dipstick: NEGATIVE
Ketones, ur: 15 mg/dL — AB
Nitrite: NEGATIVE
Protein, ur: NEGATIVE mg/dL
Specific Gravity, Urine: 1.026 (ref 1.005–1.030)
Urobilinogen, UA: 0.2 mg/dL (ref 0.0–1.0)

## 2011-05-14 LAB — DIFFERENTIAL
Basophils Relative: 0 % (ref 0–1)
Eosinophils Relative: 1 % (ref 0–5)
Monocytes Absolute: 0.5 10*3/uL (ref 0.1–1.0)
Monocytes Relative: 8 % (ref 3–12)
Neutro Abs: 3.8 10*3/uL (ref 1.7–7.7)

## 2011-05-16 ENCOUNTER — Emergency Department (HOSPITAL_COMMUNITY)
Admission: EM | Admit: 2011-05-16 | Discharge: 2011-05-16 | Disposition: A | Discharge status: Home / Self Care | Payer: Medicare Other | Attending: Emergency Medicine | Admitting: Emergency Medicine

## 2011-05-16 ENCOUNTER — Emergency Department (HOSPITAL_COMMUNITY): Payer: Medicare Other

## 2011-05-16 DIAGNOSIS — F3289 Other specified depressive episodes: Secondary | ICD-10-CM | POA: Insufficient documentation

## 2011-05-16 DIAGNOSIS — E785 Hyperlipidemia, unspecified: Secondary | ICD-10-CM | POA: Insufficient documentation

## 2011-05-16 DIAGNOSIS — I440 Atrioventricular block, first degree: Secondary | ICD-10-CM | POA: Insufficient documentation

## 2011-05-16 DIAGNOSIS — E119 Type 2 diabetes mellitus without complications: Secondary | ICD-10-CM | POA: Insufficient documentation

## 2011-05-16 DIAGNOSIS — F329 Major depressive disorder, single episode, unspecified: Secondary | ICD-10-CM | POA: Insufficient documentation

## 2011-05-16 DIAGNOSIS — N390 Urinary tract infection, site not specified: Secondary | ICD-10-CM | POA: Insufficient documentation

## 2011-05-16 DIAGNOSIS — R109 Unspecified abdominal pain: Secondary | ICD-10-CM | POA: Insufficient documentation

## 2011-05-16 DIAGNOSIS — R5381 Other malaise: Secondary | ICD-10-CM | POA: Insufficient documentation

## 2011-05-16 DIAGNOSIS — I10 Essential (primary) hypertension: Secondary | ICD-10-CM | POA: Insufficient documentation

## 2011-05-16 LAB — COMPREHENSIVE METABOLIC PANEL
AST: 25 U/L (ref 0–37)
Albumin: 3.9 g/dL (ref 3.5–5.2)
BUN: 8 mg/dL (ref 6–23)
Calcium: 9.1 mg/dL (ref 8.4–10.5)
Creatinine, Ser: 0.62 mg/dL (ref 0.4–1.2)
GFR calc Af Amer: >60 mL/min (ref >60)

## 2011-05-16 LAB — URINALYSIS, ROUTINE W REFLEX MICROSCOPIC
Bilirubin Urine: NEGATIVE
Nitrite: NEGATIVE
Specific Gravity, Urine: 1.018 (ref 1.005–1.030)
pH: 6 (ref 5.0–8.0)

## 2011-05-16 LAB — CBC
MCH: 31.2 pg (ref 26.0–34.0)
MCHC: 33.5 g/dL (ref 30.0–36.0)
MCV: 93 fL (ref 78.0–100.0)
Platelets: 338 10*3/uL (ref 150–400)
RDW: 12.8 % (ref 11.5–15.5)

## 2011-05-16 LAB — DIFFERENTIAL
Basophils Relative: 0 % (ref 0–1)
Eosinophils Absolute: 0 10*3/uL (ref 0.0–0.7)
Eosinophils Relative: 0 % (ref 0–5)
Monocytes Absolute: 0.6 10*3/uL (ref 0.1–1.0)
Monocytes Relative: 11 % (ref 3–12)
Neutrophils Relative %: 52 % (ref 43–77)

## 2011-05-16 LAB — URINE MICROSCOPIC-ADD ON

## 2011-05-17 LAB — URINE CULTURE
Culture  Setup Time: 201205300721
Culture: NO GROWTH

## 2011-09-14 LAB — DIFFERENTIAL
Basophils Absolute: 0
Basophils Relative: 0
Lymphocytes Relative: 26
Neutro Abs: 3.9
Neutrophils Relative %: 64

## 2011-09-14 LAB — URINALYSIS, ROUTINE W REFLEX MICROSCOPIC
Glucose, UA: NEGATIVE
Ketones, ur: NEGATIVE
Nitrite: NEGATIVE
Specific Gravity, Urine: 1.022
pH: 5.5

## 2011-09-14 LAB — CBC
HCT: 38.5
Hemoglobin: 12.9
MCV: 96.3
Platelets: 309
RDW: 13.9
WBC: 6.1

## 2011-09-14 LAB — COMPREHENSIVE METABOLIC PANEL
Albumin: 3.9
Alkaline Phosphatase: 72
BUN: 8
Chloride: 105
Creatinine, Ser: 0.79
GFR calc non Af Amer: >60
Glucose, Bld: 144 — ABNORMAL HIGH
Potassium: 3.6
Total Bilirubin: 0.7

## 2011-09-14 LAB — BASIC METABOLIC PANEL
BUN: 8
Chloride: 105
GFR calc Af Amer: >60
GFR calc non Af Amer: >60
Potassium: 3.4 — ABNORMAL LOW
Sodium: 140

## 2011-09-14 LAB — URINE MICROSCOPIC-ADD ON

## 2011-09-14 LAB — GLUCOSE, CAPILLARY

## 2011-09-18 LAB — GLUCOSE, CAPILLARY

## 2011-10-02 LAB — DIFFERENTIAL
Basophils Absolute: 0
Basophils Relative: 0
Eosinophils Absolute: 0
Eosinophils Relative: 1
Lymphocytes Relative: 32
Lymphs Abs: 1.7
Monocytes Absolute: 0.6
Monocytes Relative: 11
Neutro Abs: 2.9
Neutrophils Relative %: 56

## 2011-10-02 LAB — CBC
HCT: 38
Hemoglobin: 12.7
MCHC: 33.5
MCV: 93.8
Platelets: 388
RBC: 4.05
RDW: 14.6 — ABNORMAL HIGH
WBC: 5.2

## 2011-10-02 LAB — POCT URINALYSIS DIP (DEVICE)
Bilirubin Urine: NEGATIVE
Glucose, UA: NEGATIVE
Nitrite: NEGATIVE

## 2011-10-02 LAB — I-STAT 8, (EC8 V) (CONVERTED LAB)
BUN: 9
Chloride: 107
Hemoglobin: 15
Operator id: 116391
Sodium: 142

## 2011-10-06 ENCOUNTER — Emergency Department (HOSPITAL_COMMUNITY)
Admission: EM | Admit: 2011-10-06 | Discharge: 2011-10-06 | Disposition: A | Discharge status: Home / Self Care | Payer: Medicare Other | Attending: Emergency Medicine | Admitting: Emergency Medicine

## 2011-10-06 ENCOUNTER — Emergency Department (HOSPITAL_COMMUNITY): Payer: Medicare Other

## 2011-10-06 DIAGNOSIS — E119 Type 2 diabetes mellitus without complications: Secondary | ICD-10-CM | POA: Insufficient documentation

## 2011-10-06 DIAGNOSIS — IMO0002 Reserved for concepts with insufficient information to code with codable children: Secondary | ICD-10-CM | POA: Insufficient documentation

## 2011-10-06 DIAGNOSIS — W010XXA Fall on same level from slipping, tripping and stumbling without subsequent striking against object, initial encounter: Secondary | ICD-10-CM | POA: Insufficient documentation

## 2011-10-06 DIAGNOSIS — I10 Essential (primary) hypertension: Secondary | ICD-10-CM | POA: Insufficient documentation

## 2011-10-06 DIAGNOSIS — Y92009 Unspecified place in unspecified non-institutional (private) residence as the place of occurrence of the external cause: Secondary | ICD-10-CM | POA: Insufficient documentation

## 2011-10-06 LAB — GLUCOSE, CAPILLARY: Glucose-Capillary: 94 mg/dL (ref 70–99)

## 2011-11-07 ENCOUNTER — Ambulatory Visit: Payer: Medicare Other | Attending: Family Medicine | Admitting: Physical Therapy

## 2011-11-07 DIAGNOSIS — M25619 Stiffness of unspecified shoulder, not elsewhere classified: Secondary | ICD-10-CM | POA: Insufficient documentation

## 2011-11-07 DIAGNOSIS — M25519 Pain in unspecified shoulder: Secondary | ICD-10-CM | POA: Insufficient documentation

## 2011-11-07 DIAGNOSIS — IMO0001 Reserved for inherently not codable concepts without codable children: Secondary | ICD-10-CM | POA: Insufficient documentation

## 2011-11-14 ENCOUNTER — Ambulatory Visit: Payer: Medicare Other | Admitting: Physical Therapy

## 2011-11-19 ENCOUNTER — Ambulatory Visit: Payer: Medicare Other | Attending: Family Medicine | Admitting: Physical Therapy

## 2011-11-19 DIAGNOSIS — M25519 Pain in unspecified shoulder: Secondary | ICD-10-CM | POA: Insufficient documentation

## 2011-11-19 DIAGNOSIS — M25619 Stiffness of unspecified shoulder, not elsewhere classified: Secondary | ICD-10-CM | POA: Insufficient documentation

## 2011-11-19 DIAGNOSIS — IMO0001 Reserved for inherently not codable concepts without codable children: Secondary | ICD-10-CM | POA: Insufficient documentation

## 2011-11-21 ENCOUNTER — Ambulatory Visit: Payer: Medicare Other | Admitting: Physical Therapy

## 2011-11-22 ENCOUNTER — Ambulatory Visit: Payer: Medicare Other | Admitting: Physical Therapy

## 2011-11-26 ENCOUNTER — Ambulatory Visit: Payer: Medicare Other | Admitting: Physical Therapy

## 2011-11-29 ENCOUNTER — Ambulatory Visit: Payer: Medicare Other | Admitting: Physical Therapy

## 2011-12-03 ENCOUNTER — Ambulatory Visit: Payer: Medicare Other | Admitting: Physical Therapy

## 2011-12-05 ENCOUNTER — Ambulatory Visit: Payer: Medicare Other | Admitting: Physical Therapy

## 2011-12-12 ENCOUNTER — Ambulatory Visit: Payer: Medicare Other | Admitting: Physical Therapy

## 2011-12-24 ENCOUNTER — Ambulatory Visit: Payer: Medicare Other | Attending: Family Medicine | Admitting: Physical Therapy

## 2011-12-24 DIAGNOSIS — IMO0001 Reserved for inherently not codable concepts without codable children: Secondary | ICD-10-CM | POA: Insufficient documentation

## 2011-12-24 DIAGNOSIS — M25519 Pain in unspecified shoulder: Secondary | ICD-10-CM | POA: Insufficient documentation

## 2011-12-24 DIAGNOSIS — M25619 Stiffness of unspecified shoulder, not elsewhere classified: Secondary | ICD-10-CM | POA: Insufficient documentation

## 2011-12-26 ENCOUNTER — Ambulatory Visit: Payer: Medicare Other | Admitting: Physical Therapy

## 2011-12-31 ENCOUNTER — Ambulatory Visit: Payer: Medicare Other | Admitting: Physical Therapy

## 2012-01-02 ENCOUNTER — Ambulatory Visit: Payer: Medicare Other | Admitting: Physical Therapy

## 2012-01-02 ENCOUNTER — Ambulatory Visit
Admission: RE | Admit: 2012-01-02 | Discharge: 2012-01-02 | Disposition: A | Discharge status: Home / Self Care | Payer: Medicare Other | Source: Ambulatory Visit | Attending: Internal Medicine | Admitting: Internal Medicine

## 2012-01-02 ENCOUNTER — Other Ambulatory Visit: Payer: Self-pay | Admitting: Internal Medicine

## 2012-01-02 DIAGNOSIS — M545 Low back pain: Secondary | ICD-10-CM

## 2012-01-07 ENCOUNTER — Ambulatory Visit: Payer: Medicare Other | Admitting: Physical Therapy

## 2012-01-09 ENCOUNTER — Ambulatory Visit: Payer: Medicare Other | Admitting: Physical Therapy

## 2012-01-17 ENCOUNTER — Other Ambulatory Visit: Payer: Self-pay | Admitting: Internal Medicine

## 2012-01-17 DIAGNOSIS — R911 Solitary pulmonary nodule: Secondary | ICD-10-CM

## 2012-01-18 ENCOUNTER — Other Ambulatory Visit: Payer: Medicare Other

## 2012-01-21 ENCOUNTER — Other Ambulatory Visit: Payer: Medicare Other

## 2012-01-22 ENCOUNTER — Other Ambulatory Visit: Payer: Medicare Other

## 2012-01-23 ENCOUNTER — Ambulatory Visit
Admission: RE | Admit: 2012-01-23 | Discharge: 2012-01-23 | Disposition: A | Discharge status: Home / Self Care | Payer: Medicare Other | Source: Ambulatory Visit | Attending: Internal Medicine | Admitting: Internal Medicine

## 2012-01-23 ENCOUNTER — Other Ambulatory Visit: Payer: Medicare Other

## 2012-01-23 DIAGNOSIS — R911 Solitary pulmonary nodule: Secondary | ICD-10-CM

## 2012-01-24 ENCOUNTER — Ambulatory Visit: Payer: Medicare Other | Attending: Family Medicine | Admitting: Physical Therapy

## 2012-01-24 DIAGNOSIS — IMO0001 Reserved for inherently not codable concepts without codable children: Secondary | ICD-10-CM | POA: Insufficient documentation

## 2012-01-24 DIAGNOSIS — M25519 Pain in unspecified shoulder: Secondary | ICD-10-CM | POA: Insufficient documentation

## 2012-01-24 DIAGNOSIS — M25619 Stiffness of unspecified shoulder, not elsewhere classified: Secondary | ICD-10-CM | POA: Insufficient documentation

## 2012-01-25 ENCOUNTER — Ambulatory Visit: Payer: Medicare Other | Admitting: Physical Therapy

## 2012-02-01 ENCOUNTER — Other Ambulatory Visit: Payer: Self-pay | Admitting: Internal Medicine

## 2012-02-09 ENCOUNTER — Emergency Department (HOSPITAL_COMMUNITY)
Admission: EM | Admit: 2012-02-09 | Discharge: 2012-02-09 | Disposition: A | Discharge status: Home / Self Care | Payer: Medicare Other | Attending: Emergency Medicine | Admitting: Emergency Medicine

## 2012-02-09 ENCOUNTER — Encounter (HOSPITAL_COMMUNITY): Payer: Self-pay | Admitting: Adult Health

## 2012-02-09 DIAGNOSIS — H9209 Otalgia, unspecified ear: Secondary | ICD-10-CM | POA: Insufficient documentation

## 2012-02-09 DIAGNOSIS — R599 Enlarged lymph nodes, unspecified: Secondary | ICD-10-CM | POA: Insufficient documentation

## 2012-02-09 DIAGNOSIS — R590 Localized enlarged lymph nodes: Secondary | ICD-10-CM

## 2012-02-09 DIAGNOSIS — K089 Disorder of teeth and supporting structures, unspecified: Secondary | ICD-10-CM | POA: Insufficient documentation

## 2012-02-09 DIAGNOSIS — K0889 Other specified disorders of teeth and supporting structures: Secondary | ICD-10-CM

## 2012-02-09 DIAGNOSIS — K029 Dental caries, unspecified: Secondary | ICD-10-CM | POA: Insufficient documentation

## 2012-02-09 DIAGNOSIS — M79609 Pain in unspecified limb: Secondary | ICD-10-CM | POA: Insufficient documentation

## 2012-02-09 MED ORDER — PENICILLIN V POTASSIUM 500 MG PO TABS: 500.0000 mg | ORAL_TABLET | Freq: Three times a day (TID) | ORAL | Status: AC

## 2012-02-09 MED ORDER — DICLOFENAC SODIUM 75 MG PO TBEC: 75.0000 mg | DELAYED_RELEASE_TABLET | Freq: Two times a day (BID) | ORAL | Status: DC

## 2012-02-09 NOTE — ED Notes (Signed)
Pt states, "my tooth busted last week and it is now infected and it hurts like a deep nerve pain. My right arm is also infected its infected all the way down to the thumb and it hurts. I got kids at home and they are getting on my nerves because of this pain. My throat is also infected, I think I have some kind of virus"

## 2012-02-09 NOTE — ED Provider Notes (Signed)
History     CSN: [id number]  Arrival date & time 02/09/12  1255   First MD Initiated Contact with Patient 02/09/12 1444      3:10 PM HPI Patient reports she is here for dental pain. States she saw a Education officer, community in [place]. States he "grinded" her teeth down and now she is having severe pain in her right upper canine. Denies abscess, fever, or difficulty breathing. Reports she thinks it is infected. Also reports right upper arm pain that radiates to her hand. Denies known injury. States she has a skin infection in her upper arm that she is being treated for with doxycycline. Denies mass, erythema or fevers.   Patient is a 71 y.o. female presenting with tooth pain and arm pain. The history is provided by the patient.  Dental PainPrimary symptoms do not include headaches, fever, sore throat, angioedema or cough. The symptoms began more than 1 week ago. The symptoms are worsening. The symptoms are chronic. The symptoms occur constantly.  Additional symptoms include: dental sensitivity to temperature, jaw pain and ear pain. Additional symptoms do not include: gum swelling, gum tenderness, purulent gums, trismus, facial swelling and trouble swallowing.   Arm Pain This is a new problem. Episode onset: 2 weeks ago. The problem occurs constantly. The problem has been gradually worsening. Pertinent negatives include no chills, coughing, fever, headaches, neck pain, numbness, sore throat or weakness. Treatments tried: doxycycline. The treatment provided no relief.    Past Medical History  Diagnosis Date  . Hypertension   . Diabetes mellitus   . Physical exam, annual 10/22/06    Past Surgical History  Procedure Date  . Abdominal hysterectomy     History reviewed. No pertinent family history.  History  Substance Use Topics  . Smoking status: Not on file  . Smokeless tobacco: Not on file  . Alcohol Use:     OB History    Grav Para Term Preterm Abortions TAB SAB Ect Mult Living         Review of Systems  Constitutional: Negative for fever and chills.  HENT: Positive for ear pain. Negative for sore throat, facial swelling, trouble swallowing and neck pain.        Positive for toothache  Respiratory: Negative for cough.   Musculoskeletal:       Arm pain  Neurological: Negative for weakness, numbness and headaches.  All other systems reviewed and are negative.    Allergies  Sulphur  Home Medications   Current Outpatient Rx  Name Route Sig Dispense Refill  . ALPRAZOLAM 0.5 MG PO TABS Oral Take 0.5 mg by mouth at bedtime as needed. For anxiety    . AMLODIPINE BESYLATE 5 MG PO TABS Oral Take 5 mg by mouth daily.      [name] DOXYCYCLINE HYCLATE 100 MG PO CAPS Oral Take 100 mg by mouth 2 (two) times daily. Take for 10 days    . ESCITALOPRAM OXALATE 10 MG PO TABS Oral Take 10 mg by mouth daily.    [name] ESTRADIOL 0.025 MG/24HR TD PTWK Transdermal Place 1 patch onto the skin once a week.    [name] GLIMEPIRIDE 4 MG PO TABS Oral Take 4 mg by mouth daily before breakfast.    . METFORMIN HCL ER (OSM) 1000 MG PO TB24 Oral Take 1,000 mg by mouth daily with breakfast.    . PREDNISOLONE 5 MG PO TABS Oral Take 5 mg by mouth 2 (two) times daily.      BP 101/55  Pulse 86  Temp 98.1 F (36.7 C)  Resp 16  SpO2 99%  Physical Exam  Vitals reviewed. Constitutional: She is oriented to person, place, and time. Vital signs are normal. She appears well-developed and well-nourished. No distress.  HENT:  Head: Normocephalic and atraumatic. No trismus in the jaw.  Right Ear: Tympanic membrane, external ear and ear canal normal.  Left Ear: Tympanic membrane, external ear and ear canal normal.  Nose: Nose normal.  Mouth/Throat: Oropharynx is clear and moist and mucous membranes are normal. She does not have dentures. No oral lesions. Dental caries present. No dental abscesses, uvula swelling or lacerations.  Eyes: Pupils are equal, round, and reactive to light.  Neck: Neck supple.    Pulmonary/Chest: Effort normal.  Musculoskeletal:       Cervical back: She exhibits normal range of motion, no tenderness, no bony tenderness, no swelling, no laceration, no pain and no spasm.       Radicular pain with percussion of right cervical paraspinal region. Negative tinel's and phalen's. Small nontender dry scab on right anterior upper arm approximately 1 cm in diameter. No mass, abscess or cellulitis. Patient states this is where pain began.   Lymphadenopathy:       Head (right side): Tonsillar adenopathy present.       Head (left side): Tonsillar adenopathy present.  Neurological: She is alert and oriented to person, place, and time. No cranial nerve deficit. Coordination and gait normal.  Skin: Skin is warm and dry. No rash noted. No erythema. No pallor.  Psychiatric: She has a normal mood and affect. Her behavior is normal.    ED Course  Procedures   MDM  Pt has multiple dental caries and erosion of lower teeth. No pain caused with pecussion of teeth. No abscess palpated. Will tx with penicillin and diclofenac. Referred to Dr. Lucky Cowboy, DDS.   Pt's arm seems to be radicular pain. No signs of infection. However advised her to continue doxy. Pt agrees with plan and is ready for d/c        Thomasene Lot, PA-C 02/09/12 1620

## 2012-02-09 NOTE — ED Notes (Signed)
Patient has multiple c/o beginning at the top of her head all the way down to her feet.  Patient's main concern is her tooth and her throat.  Denies fever.  Seen at the dental school in [place] last week and she says they did not fix her tooth.  She states she is in need of a dentist and did not realize the ER did not employ dentists to work on her teeth on a Saturday.  States she has not taken anything for pain or discomfort prior to coming to the ER today.

## 2012-02-09 NOTE — Discharge Instructions (Signed)

## 2012-02-10 NOTE — ED Provider Notes (Signed)
Medical screening examination/treatment/procedure(s) were performed by non-physician practitioner and as supervising physician I was immediately available for consultation/collaboration.  Ethelda Chick, MD 02/10/12 [phone]

## 2012-02-15 ENCOUNTER — Emergency Department (HOSPITAL_COMMUNITY)
Admission: EM | Admit: 2012-02-15 | Discharge: 2012-02-15 | Disposition: A | Discharge status: Home / Self Care | Payer: Medicare Other | Attending: Emergency Medicine | Admitting: Emergency Medicine

## 2012-02-15 ENCOUNTER — Other Ambulatory Visit: Payer: Self-pay

## 2012-02-15 ENCOUNTER — Encounter (HOSPITAL_COMMUNITY): Payer: Self-pay | Admitting: Emergency Medicine

## 2012-02-15 DIAGNOSIS — R1310 Dysphagia, unspecified: Secondary | ICD-10-CM | POA: Insufficient documentation

## 2012-02-15 DIAGNOSIS — IMO0001 Reserved for inherently not codable concepts without codable children: Secondary | ICD-10-CM | POA: Insufficient documentation

## 2012-02-15 DIAGNOSIS — K0889 Other specified disorders of teeth and supporting structures: Secondary | ICD-10-CM

## 2012-02-15 DIAGNOSIS — R079 Chest pain, unspecified: Secondary | ICD-10-CM | POA: Insufficient documentation

## 2012-02-15 DIAGNOSIS — K089 Disorder of teeth and supporting structures, unspecified: Secondary | ICD-10-CM | POA: Insufficient documentation

## 2012-02-15 DIAGNOSIS — M79609 Pain in unspecified limb: Secondary | ICD-10-CM | POA: Insufficient documentation

## 2012-02-15 DIAGNOSIS — I10 Essential (primary) hypertension: Secondary | ICD-10-CM | POA: Insufficient documentation

## 2012-02-15 DIAGNOSIS — Z79899 Other long term (current) drug therapy: Secondary | ICD-10-CM | POA: Insufficient documentation

## 2012-02-15 DIAGNOSIS — R070 Pain in throat: Secondary | ICD-10-CM | POA: Insufficient documentation

## 2012-02-15 DIAGNOSIS — M79603 Pain in arm, unspecified: Secondary | ICD-10-CM

## 2012-02-15 DIAGNOSIS — E119 Type 2 diabetes mellitus without complications: Secondary | ICD-10-CM | POA: Insufficient documentation

## 2012-02-15 LAB — BASIC METABOLIC PANEL
BUN: 11 mg/dL (ref 6–23)
Calcium: 9.4 mg/dL (ref 8.4–10.5)
Chloride: 105 mEq/L (ref 96–112)
Creatinine, Ser: 0.65 mg/dL (ref 0.50–1.10)
GFR calc Af Amer: >90 mL/min (ref >90)

## 2012-02-15 LAB — CBC
HCT: 39.7 % (ref 36.0–46.0)
MCHC: 33.2 g/dL (ref 30.0–36.0)
MCV: 93.4 fL (ref 78.0–100.0)
Platelets: 323 10*3/uL (ref 150–400)
RDW: 13.8 % (ref 11.5–15.5)
WBC: 6.8 10*3/uL (ref 4.0–10.5)

## 2012-02-15 MED ORDER — POTASSIUM CHLORIDE CRYS ER 20 MEQ PO TBCR
40.0000 meq | EXTENDED_RELEASE_TABLET | Freq: Once | ORAL | Status: AC
  Administered 2012-02-15: 40 meq via ORAL
  Filled 2012-02-15: qty 2

## 2012-02-15 MED ORDER — ESOMEPRAZOLE MAGNESIUM 40 MG PO CPDR: 40.0000 mg | DELAYED_RELEASE_CAPSULE | Freq: Every day | ORAL | Status: DC

## 2012-02-15 NOTE — ED Notes (Signed)
Pt speaking in complete sentences. Respirations even and unlabored. NAD noted at this time.

## 2012-02-15 NOTE — ED Notes (Signed)
Pt. Having difficulty swallowing, right arm pain, and dental pain on right side.  Seen in ED on Sat. For dental pain.

## 2012-02-15 NOTE — Discharge Instructions (Signed)
Read instructions below for reasons to return to the Emergency Department. It is recommended that your follow up with your Primary Care Doctor in regards to today's visit. If you do not have a doctor, use the resource guide listed below to help you find one. Begin taking over the counter Nexium as directed. Call Dr. Lacretia Leigh office for a dental apt as well.   Chest Pain (Nonspecific)  HOME CARE INSTRUCTIONS  For the next few days, avoid physical activities that bring on chest pain. Continue physical activities as directed.  Do not smoke cigarettes or drink alcohol until your symptoms are gone.  Only take over-the-counter or prescription medicine for pain, discomfort, or fever as directed by your caregiver.  Follow your caregiver's suggestions for further testing if your chest pain does not go away.  Keep any follow-up appointments you made. If you do not go to an appointment, you could develop lasting (chronic) problems with pain. If there is any problem keeping an appointment, you must call to reschedule.  SEEK MEDICAL CARE IF:  You think you are having problems from the medicine you are taking. Read your medicine instructions carefully.  Your chest pain does not go away, even after treatment.  You develop a rash with blisters on your chest.  SEEK IMMEDIATE MEDICAL CARE IF:  You have increased chest pain or pain that spreads to your arm, neck, jaw, back, or belly (abdomen).  You develop shortness of breath, an increasing cough, or you are coughing up blood.  You have severe back or abdominal pain, feel sick to your stomach (nauseous) or throw up (vomit).  You develop severe weakness, fainting, or chills.  You have an oral temperature above 102 F (38.9 C), not controlled by medicine.   THIS IS AN EMERGENCY. Do not wait to see if the pain will go away. Get medical help at once. Call your local emergency services (911 in [place] Do not drive yourself to the hospital.   RESOURCE GUIDE  Dental  Problems  Patients with Medicaid: [facility] [phone]                                           [phone] [address] [employer] Phone:  [phone]                                                  Phone:  [phone]  If unable to pay or uninsured, contact:  [facility] or [facility]. to become qualified for the adult dental clinic.  Chronic Pain Problems Contact [facility]  [phone] Patients need to be referred by their primary care doctor.  Insufficient Money for Medicine Contact [employer] Way:  call "211" or [facility] [phone].  No Primary Care Doctor Call [facility]  [phone] Other agencies that provide inexpensive medical care    [facility]  [phone]    [facility]  [phone]    [facility]  [phone]    [facility]  [phone]    [facility]  [phone]  [facility]  [phone]  Psychological Services [facility] Behavioral Health  [phone] [facility]  [phone] [facility] Mental Health   [phone] (emergency services [phone])  Substance Abuse Resources [facility]  [phone] [facility] [phone] [facility] [phone] Floydene Flock [phone] Residential & Outpatient Substance Abuse Program  [phone]  Abuse/Neglect [facility] Child Abuse Hotline ([phone] [facility] Child Abuse Hotline (475) 837-2560 (After Hours)  Emergency Shelter [facility] [phone]  Maternity Homes [facility] [place] [facility] [phone] [facility] [phone]  MRSA Hotline #:   279 686 2720    [facility] Resources  [facility]     [employer]                          [facility] Dept. [address] [place]                       [address]      [address]    Blondell Reveal Phone:  [phone]                                   Phone:  [phone]                 Phone:  [phone]  [facility] Mental Health Phone:  [phone]  [facility] Child Abuse Hotline [phone] [phone] (After Hours)

## 2012-02-15 NOTE — ED Provider Notes (Signed)
Medical screening examination/treatment/procedure(s) were performed by non-physician practitioner and as supervising physician I was immediately available for consultation/collaboration.   Nasha Diss, MD 02/15/12 2150 

## 2012-02-15 NOTE — ED Provider Notes (Signed)
History     CSN: [id number]  Arrival date & time 02/15/12  1529   First MD Initiated Contact with Patient 02/15/12 1654      Chief Complaint  Patient presents with  . Dysphagia    (Consider location/radiation/quality/duration/timing/severity/associated sxs/prior treatment) HPI Comments: Patient with a history of hypertension and diabetes presents emergency Department with chief complaint of dysphagia, chest pain, right arm pain, and right-sided dental pain.  Patient has been seen and evaluated in this emergency department last week for similar symptoms.  Patient describes chest pain as right-sided in nature and feels that it moves up and down her stomach.  She denies radiation to left side or left-sided jaw pain.  Patient did not have a cardiac history. Patient is currently being followed by Dr. August Saucer her primary care physician.  She was also evaluated by him to weeks ago for chest pain, pt has an appointment with him Monday AM, a CT Chest done on 01/23/2012 that showed no acute findings. Patient has a referral to Dr. Lucky Cowboy for her dental pain and will see him in next week.  Today patient states that she is having chest pain and right-sided arm pain.  Patient also reports that she's not been taking her Nexium for the last 2 months.  In addition patient is taking doxycycline currently for possible skin infection of right upper arm.    The history is provided by the patient.    Past Medical History  Diagnosis Date  . Hypertension   . Diabetes mellitus   . Physical exam, annual 10/22/06    Past Surgical History  Procedure Date  . Abdominal hysterectomy     No family history on file.  History  Substance Use Topics  . Smoking status: Not on file  . Smokeless tobacco: Not on file  . Alcohol Use:     OB History    Grav Para Term Preterm Abortions TAB SAB Ect Mult Living                  Review of Systems  Constitutional: Negative for fever, chills and appetite change.  HENT:  Positive for sore throat, trouble swallowing and dental problem. Negative for congestion, drooling, mouth sores, neck pain, neck stiffness, voice change and ear discharge.   Eyes: Negative for visual disturbance.  Respiratory: Negative for shortness of breath, wheezing and stridor.   Cardiovascular: Positive for chest pain. Negative for palpitations and leg swelling.  Gastrointestinal: Negative for abdominal pain.  Genitourinary: Negative for dysuria, urgency and frequency.  Musculoskeletal: Positive for myalgias. Negative for back pain, joint swelling, arthralgias and gait problem.  Skin: Negative for rash.  Neurological: Negative for dizziness, syncope, weakness, light-headedness, numbness and headaches.  Psychiatric/Behavioral: Negative for confusion.    Allergies  Sulphur  Home Medications   Current Outpatient Rx  Name Route Sig Dispense Refill  . ALPRAZOLAM 0.5 MG PO TABS Oral Take 0.5 mg by mouth at bedtime as needed. For anxiety    . AMLODIPINE BESYLATE 5 MG PO TABS Oral Take 5 mg by mouth daily.      [name] DICLOFENAC SODIUM 75 MG PO TBEC Oral Take 1 tablet (75 mg total) by mouth 2 (two) times daily. 30 tablet 0  . DOXYCYCLINE HYCLATE 100 MG PO CAPS Oral Take 100 mg by mouth 2 (two) times daily. Take for 10 days    . ESCITALOPRAM OXALATE 10 MG PO TABS Oral Take 10 mg by mouth daily.    [name] ESTRADIOL 0.025 MG/24HR  TD PTWK Transdermal Place 1 patch onto the skin once a week.    [name] GLIMEPIRIDE 4 MG PO TABS Oral Take 4 mg by mouth daily before breakfast.    . METFORMIN HCL ER (OSM) 1000 MG PO TB24 Oral Take 1,000 mg by mouth daily with breakfast.    . PENICILLIN V POTASSIUM 500 MG PO TABS Oral Take 1 tablet (500 mg total) by mouth 3 (three) times daily. 30 tablet 0  . PREDNISOLONE 5 MG PO TABS Oral Take 5 mg by mouth 2 (two) times daily.      BP 114/66  Pulse 68  Temp(Src) 98.7 F (37.1 C) (Oral)  Resp 20  Ht 5\' 4"  (1.626 m)  Wt 140 lb (63.504 kg)  BMI 24.03 kg/m2  SpO2  99%  Physical Exam  Nursing note and vitals reviewed. Constitutional: She is oriented to person, place, and time. She appears well-developed and well-nourished. No distress.  HENT:  Head: Normocephalic and atraumatic. No trismus in the jaw.  Mouth/Throat: Uvula is midline, oropharynx is clear and moist and mucous membranes are normal. Abnormal dentition. No dental abscesses or uvula swelling. No oropharyngeal exudate, posterior oropharyngeal edema, posterior oropharyngeal erythema or tonsillar abscesses.       Poor dental hygiene. Pt able to open and close mouth with out difficulty. Airway intact. Uvula midline. No gingival swelling or gum tenderness. No fluctuance or abscess. No swelling or tenderness of submental and submandibular regions.  Eyes: Conjunctivae and EOM are normal. Pupils are equal, round, and reactive to light.  Neck: Normal range of motion and full passive range of motion without pain. Neck supple. Normal carotid pulses and no JVD present. Carotid bruit is not present. No rigidity. Normal range of motion present.       No cervical lymphadenopathy  Cardiovascular: Normal rate, regular rhythm, S1 normal, S2 normal, normal heart sounds, intact distal pulses and normal pulses.  Exam reveals no gallop and no friction rub.   No murmur heard.      No pitting edema bilaterally, RRR, no aberrant sounds on auscultations, distal pulses intact, no carotid bruit or JVD.   Pulmonary/Chest: Effort normal and breath sounds normal. No accessory muscle usage or stridor. No respiratory distress. She has no wheezes. She exhibits no tenderness and no bony tenderness.  Abdominal: Bowel sounds are normal.       Soft non tender. Non pulsatile aorta.   Musculoskeletal: Normal range of motion.       Full range of motion of right extremity.  Muscular tenderness to palpation of the biceps and tricep area.  No extremity swelling.  Distal pulses palpable.  Lymphadenopathy:       Head (right side): No  submental, no submandibular, no tonsillar, no preauricular and no posterior auricular adenopathy present.       Head (left side): No submental, no submandibular, no tonsillar, no preauricular and no posterior auricular adenopathy present.    She has no cervical adenopathy.  Neurological: She is alert and oriented to person, place, and time.       Swallow intact  Skin: Skin is warm, dry and intact. No rash noted. She is not diaphoretic. No cyanosis. Nails show no clubbing.       No evidence of cellulitis or abscess of the right upper arm.  Small 0.5 cm round scab.    ED Course  Procedures (including critical care time)  Labs Reviewed - No data to display No results found.   No diagnosis found.  CT CHEST WITHOUT CONTRAST 01/23/2012 Technique: Multidetector CT imaging of the chest was performed  following the standard protocol without IV contrast.  Comparison: None  Findings: The chest wall is unremarkable. No obvious breast  masses, supraclavicular or axillary lymphadenopathy. The thyroid  gland appears normal. The bony thorax is intact.  The heart is normal in size. No pericardial effusion. No  mediastinal or hilar lymphadenopathy. A small scattered lymph  nodes are noted. The aorta is normal in caliber. The esophagus is  grossly normal.  Examination of the lung parenchyma demonstrates minimal dependent  atelectasis but no infiltrates, edema or effusions. No pulmonary  mass lesions or pulmonary nodules. The tracheobronchial tree is  grossly normal.  The upper abdomen demonstrates a splenules and multiple left renal  cysts.  IMPRESSION:  Unremarkable CT examination of the chest.  Original Report Authenticated By: P. Loralie Champagne, M.D.   MDM  Chest pain is not likely of cardiac or pulmonary etiology d/t presentation, perc negative, VSS, no tracheal deviation, no JVD or new murmur, RRR, breath sounds equal bilaterally, EKG without acute abnormalities, negative troponin. CXR not  indicated bc no SOB, cough and normal CT chest performed last week.  Pt has been advised start a PPI and return to the ED is CP becomes exertional, associated with diaphoresis or nausea, radiates to left jaw/arm, worsens or becomes concerning in any way. Pt appears reliable for follow up and is agreeable to discharge. Pt advised to keep follow up apt with dentist and PCP for next week  Case has been discussed with and seen by Dr. Brooke Dare who agrees with the above plan to discharge.          Jaci Carrel, [place] 02/15/12 1956

## 2012-02-15 NOTE — ED Notes (Signed)
Pt. With numerous physical complaints including right arm pain which she denies any trauma to, difficulty swallowing, and recent cold symptoms.

## 2012-03-07 ENCOUNTER — Encounter: Payer: Self-pay | Admitting: Family Medicine

## 2012-03-07 ENCOUNTER — Ambulatory Visit (INDEPENDENT_AMBULATORY_CARE_PROVIDER_SITE_OTHER): Payer: Medicare Other | Admitting: Family Medicine

## 2012-03-07 VITALS — BP 102/72 | HR 82 | Temp 97.7°F | Ht 65.0 in | Wt 142.0 lb

## 2012-03-07 DIAGNOSIS — M25519 Pain in unspecified shoulder: Secondary | ICD-10-CM

## 2012-03-07 DIAGNOSIS — M25512 Pain in left shoulder: Secondary | ICD-10-CM

## 2012-03-07 NOTE — Patient Instructions (Signed)
You have rotator cuff impingement Try to avoid painful activities (overhead activities, lifting with extended arm) as much as possible. Aleve and/or tylenol as needed for pain. Subacromial injection may be beneficial to help with pain and to decrease inflammation - you were given these into each shoulder. Continue physical therapy with transition to home exercise program. Do home exercise program on days you don't go to physical therapy. Follow up in 1 month. If not improving can consider further imaging, nitro patches.

## 2012-03-09 ENCOUNTER — Encounter: Payer: Self-pay | Admitting: Family Medicine

## 2012-03-09 DIAGNOSIS — M25511 Pain in right shoulder: Secondary | ICD-10-CM | POA: Insufficient documentation

## 2012-03-09 NOTE — Progress Notes (Signed)
Subjective:    Patient ID: Jacqueline Ball, female    DOB: 1941-04-26, 71 y.o.   MRN: [id number]  PCP: Dr. Willey Blade  HPI 71 yo F here for bilateral shoulder pain.  Patient known shoulder injury or trauma. States for several years she has had L > R shoulder pain. Diagnosed previously with tendinitis per her report and has done physical therapy (still doing this). + night pain. Worse with all motions of her shoulders. She denies having had shoulder injections in the past though has had these in other areas. Not currently taking any pain medication.  Past Medical History  Diagnosis Date  . Hypertension   . Diabetes mellitus   . Physical exam, annual 10/22/06  . Anxiety   . Hyperlipidemia     Current Outpatient Prescriptions on File Prior to Visit  Medication Sig Dispense Refill  . ALPRAZolam (XANAX) 0.5 MG tablet Take 0.5 mg by mouth at bedtime as needed. For anxiety      . amLODipine (NORVASC) 5 MG tablet Take 5 mg by mouth daily.        [name] escitalopram (LEXAPRO) 10 MG tablet Take 10 mg by mouth daily.      [name] esomeprazole (NEXIUM) 40 MG capsule Take 1 capsule (40 mg total) by mouth daily.  30 capsule  0  . estradiol (CLIMARA - DOSED IN MG/24 HR) 0.025 mg/24hr Place 1 patch onto the skin once a week.      [name] glimepiride (AMARYL) 4 MG tablet Take 4 mg by mouth daily before breakfast.      . metformin (FORTAMET) 1000 MG (OSM) 24 hr tablet Take 1,000 mg by mouth daily with breakfast.        Past Surgical History  Procedure Date  . Abdominal hysterectomy     Allergies  Allergen Reactions  . Sulphur (Sulfur Sublimed)     History   Social History  . Marital Status: Married    Spouse Name: EDDIE Happel    Number of Children: N/A  . Years of Education: N/A   Occupational History  . Not on file.   Social History Main Topics  . Smoking status: Never Smoker   . Smokeless tobacco: Not on file  . Alcohol Use: Not on file  . Drug Use: Not on file  . Sexually Active: Not on  file   Other Topics Concern  . Not on file   Social History Narrative  . No narrative on file    History reviewed. No pertinent family history.  BP 102/72  Pulse 82  Temp(Src) 97.7 F (36.5 C) (Oral)  Ht 5\' 5"  (1.651 m)  Wt 142 lb (64.411 kg)  BMI 23.63 kg/m2  Review of Systems See HPI above.    Objective:   Physical Exam Gen: NAD  L shoulder: No swelling, ecchymoses.  No gross deformity. Mild lateral TTP.  No AC or biceps TTP. FROM with painful arc. Positive Hawkins, Neers. Negative Speeds, Yergasons. Strength 5/5 with empty can and resisted internal/external rotation - pain with all motions. Negative apprehension. NV intact distally.  R shoulder: No swelling, ecchymoses.  No gross deformity. Mild lateral TTP.  No AC or biceps TTP. FROM with painful arc. Positive Hawkins, Neers. Negative Speeds, Yergasons. Strength 5/5 with empty can and resisted internal/external rotation - pain with all motions. Negative apprehension. NV intact distally.    Assessment & Plan:  1. Bilateral shoulder pain - 2/2 rotator cuff impingement.  Continue PT and home exercise program.  No  injury and excellent strength making rotator cuff tear, labral tear unlikely.  Given subacromial injections today.  Can take aleve and/or tylenol as needed for pain also.  If not improving after 1 month to 6 weeks, consider further imaging and/or nitro patches.   After informed written consent, patient was seated on exam table. Right shoulder was prepped with alcohol swab and utilizing posterior approach, patient's right subacromial space was injected with 3:1 marcaine: depomedrol. Patient tolerated the procedure well without immediate complications.  After informed written consent, patient was seated on exam table. Left shoulder was prepped with alcohol swab and utilizing posterior approach, patient's left subacromial space was injected with 3:1 marcaine: depomedrol. Patient tolerated the procedure well  without immediate complications.

## 2012-03-09 NOTE — Assessment & Plan Note (Signed)
2/2 rotator cuff impingement.  Continue PT and home exercise program.  No injury and excellent strength making rotator cuff tear, labral tear unlikely.  Given subacromial injections today.  Can take aleve and/or tylenol as needed for pain also.  If not improving after 1 month to 6 weeks, consider further imaging and/or nitro patches.   After informed written consent, patient was seated on exam table. Right shoulder was prepped with alcohol swab and utilizing posterior approach, patient's right subacromial space was injected with 3:1 marcaine: depomedrol. Patient tolerated the procedure well without immediate complications.  After informed written consent, patient was seated on exam table. Left shoulder was prepped with alcohol swab and utilizing posterior approach, patient's left subacromial space was injected with 3:1 marcaine: depomedrol. Patient tolerated the procedure well without immediate complications.

## 2012-03-26 ENCOUNTER — Ambulatory Visit: Payer: Medicare Other | Attending: Family Medicine | Admitting: Physical Therapy

## 2012-03-26 DIAGNOSIS — M25619 Stiffness of unspecified shoulder, not elsewhere classified: Secondary | ICD-10-CM | POA: Insufficient documentation

## 2012-03-26 DIAGNOSIS — M25519 Pain in unspecified shoulder: Secondary | ICD-10-CM | POA: Insufficient documentation

## 2012-03-26 DIAGNOSIS — IMO0001 Reserved for inherently not codable concepts without codable children: Secondary | ICD-10-CM | POA: Insufficient documentation

## 2012-03-27 ENCOUNTER — Ambulatory Visit: Payer: Medicare Other | Admitting: Physical Therapy

## 2012-04-01 ENCOUNTER — Ambulatory Visit: Payer: Medicare Other | Admitting: Physical Therapy

## 2012-04-03 ENCOUNTER — Ambulatory Visit: Payer: Medicare Other | Admitting: Physical Therapy

## 2012-04-07 ENCOUNTER — Ambulatory Visit: Payer: Medicare Other | Admitting: Family Medicine

## 2012-04-08 ENCOUNTER — Encounter: Payer: Self-pay | Admitting: Family Medicine

## 2012-04-08 ENCOUNTER — Ambulatory Visit (INDEPENDENT_AMBULATORY_CARE_PROVIDER_SITE_OTHER): Payer: Medicare Other | Admitting: Family Medicine

## 2012-04-08 VITALS — BP 111/72 | HR 80 | Temp 97.9°F | Ht 64.0 in | Wt 142.0 lb

## 2012-04-08 DIAGNOSIS — M25519 Pain in unspecified shoulder: Secondary | ICD-10-CM

## 2012-04-08 DIAGNOSIS — R252 Cramp and spasm: Secondary | ICD-10-CM

## 2012-04-08 DIAGNOSIS — M25511 Pain in right shoulder: Secondary | ICD-10-CM

## 2012-04-08 MED ORDER — NITROGLYCERIN 0.2 MG/HR TD PT24: MEDICATED_PATCH | TRANSDERMAL | Status: DC

## 2012-04-08 NOTE — Patient Instructions (Signed)
For cramps you have to ensure you're taking in plenty of fluids - 6-8 glasses of water a day. The other part of this is salt intake when you're having cramps - pickle juice, mustard, salt tablets, pretzels, crackers are all things that can help with cramps. Low electrolytes may also contribute to this but your doctor is monitoring these (potassium,calcium, magnesium). Continue with physical therapy for your shoulders. I would not repeat injections in your shoulders at this time. Nitro patches are a consideration but you should only try these on one side if you do use them - they need to be cut into 1/4th of a patch, placed over your shoulder and changed every day into a slightly different spot. Follow up with me in 2 months or as needed for your shoulders. MRIs are another consideration but only if surgery is an option down the road.

## 2012-04-09 ENCOUNTER — Encounter: Payer: Self-pay | Admitting: Family Medicine

## 2012-04-09 NOTE — Progress Notes (Signed)
Subjective:    Patient ID: Jacqueline Ball, female    DOB: 1941-08-01, 71 y.o.   MRN: [id number]  PCP: Dr. Willey Blade  HPI  70 yo F here for f/u bilateral shoulder pain.  3/22: Patient known shoulder injury or trauma. States for several years she has had L > R shoulder pain. Diagnosed previously with tendinitis per her report and has done physical therapy (still doing this). + night pain. Worse with all motions of her shoulders. She denies having had shoulder injections in the past though has had these in other areas. Not currently taking any pain medication.  4/23: Patient reported injections helped though not completely and she is now doing PT for shoulders. + night pain still and pain with all motions of shoulders though not as severe. She also had concerns about cramps in muscles throughout body (arms, hands, feet) - electrolytes have been followed by Dr. August Saucer and she actually takes potassium. Also bitten on right upper arm by a bug 1 month ago - no fever, rash, other symptoms other than local bite mark.  Past Medical History  Diagnosis Date  . Hypertension   . Diabetes mellitus   . Physical exam, annual 10/22/06  . Anxiety   . Hyperlipidemia     Current Outpatient Prescriptions on File Prior to Visit  Medication Sig Dispense Refill  . ALPRAZolam (XANAX) 0.5 MG tablet Take 0.5 mg by mouth at bedtime as needed. For anxiety      . amLODipine (NORVASC) 5 MG tablet Take 5 mg by mouth daily.        [name] atorvastatin (LIPITOR) 20 MG tablet       . escitalopram (LEXAPRO) 10 MG tablet Take 10 mg by mouth daily.      [name] esomeprazole (NEXIUM) 40 MG capsule Take 1 capsule (40 mg total) by mouth daily.  30 capsule  0  . estradiol (CLIMARA - DOSED IN MG/24 HR) 0.025 mg/24hr Place 1 patch onto the skin once a week.      [name] glimepiride (AMARYL) 4 MG tablet Take 4 mg by mouth daily before breakfast.      . KLOR-CON M20 20 MEQ tablet       . metformin (FORTAMET) 1000 MG (OSM) 24 hr tablet Take  1,000 mg by mouth daily with breakfast.      . nitroGLYCERIN (NITRODUR - DOSED IN MG/24 HR) 0.2 mg/hr Place 1/4th of a patch over affected area of shoulder - change daily  30 patch  1    Past Surgical History  Procedure Date  . Abdominal hysterectomy     Allergies  Allergen Reactions  . Sulphur (Sulfur Sublimed)     History   Social History  . Marital Status: Married    Spouse Name: EDDIE Derringer    Number of Children: N/A  . Years of Education: N/A   Occupational History  . Not on file.   Social History Main Topics  . Smoking status: Never Smoker   . Smokeless tobacco: Not on file  . Alcohol Use: Not on file  . Drug Use: Not on file  . Sexually Active: Not on file   Other Topics Concern  . Not on file   Social History Narrative  . No narrative on file    History reviewed. No pertinent family history.  BP 111/72  Pulse 80  Temp(Src) 97.9 F (36.6 C) (Oral)  Ht 5\' 4"  (1.626 m)  Wt 142 lb (64.411 kg)  BMI 24.37 kg/m2  Review of Systems  See HPI above.    Objective:   Physical Exam  Gen: NAD  L shoulder: No swelling, ecchymoses.  No gross deformity. Mild lateral TTP.  No AC or biceps TTP. FROM with painful arc. Mild positive Hawkins, Neers. Negative Speeds, Yergasons. Strength 5/5 with empty can and resisted internal/external rotation - pain with all motions. Negative apprehension. NV intact distally.  R shoulder: No swelling, ecchymoses.  No gross deformity. Mild lateral TTP.  No AC or biceps TTP. FROM with painful arc. Mild positive Hawkins, Neers. Negative Speeds, Yergasons. Strength 5/5 with empty can and resisted internal/external rotation - pain with all motions. Negative apprehension. NV intact distally.  R upper arm: Well healed area (that she designated as bite mark) without surrounding erythema, fluctuance, tenderness.    Assessment & Plan:  1. Bilateral shoulder pain - 2/2 rotator cuff impingement.  Continue PT and home exercise  program.  Add nitro patches as well - 1/4th of a patch to only one shoulder at a time - can alternate daily if she would like.  Discussed risks.  Would not repeat cortisone injections today.  Can take aleve and/or tylenol as needed for pain also.  If not improving can consider further imaging.  2. Muscle cramps - not localized.  Has electrolytes checked by Dr. August Saucer - potassium has been low before.  Otherwise we discussed proper hydration, increased salt intake when she does have cramps.

## 2012-04-14 ENCOUNTER — Ambulatory Visit: Payer: Medicare Other | Admitting: Physical Therapy

## 2012-04-17 ENCOUNTER — Ambulatory Visit: Payer: Medicare Other | Attending: Family Medicine | Admitting: Physical Therapy

## 2012-04-17 ENCOUNTER — Encounter: Payer: Self-pay | Admitting: Family Medicine

## 2012-04-17 DIAGNOSIS — R252 Cramp and spasm: Secondary | ICD-10-CM | POA: Insufficient documentation

## 2012-04-17 DIAGNOSIS — IMO0001 Reserved for inherently not codable concepts without codable children: Secondary | ICD-10-CM | POA: Insufficient documentation

## 2012-04-17 DIAGNOSIS — M25519 Pain in unspecified shoulder: Secondary | ICD-10-CM | POA: Insufficient documentation

## 2012-04-17 DIAGNOSIS — M25619 Stiffness of unspecified shoulder, not elsewhere classified: Secondary | ICD-10-CM | POA: Insufficient documentation

## 2012-04-17 NOTE — Assessment & Plan Note (Signed)
not localized.  Has electrolytes checked by Dr. August Saucer - potassium has been low before.  Otherwise we discussed proper hydration, increased salt intake when she does have cramps.

## 2012-04-17 NOTE — Assessment & Plan Note (Signed)
2/2 rotator cuff impingement.  Continue PT and home exercise program.  Add nitro patches as well - 1/4th of a patch to only one shoulder at a time - can alternate daily if she would like.  Discussed risks.  Would not repeat cortisone injections today.  Can take aleve and/or tylenol as needed for pain also.  If not improving can consider further imaging.

## 2012-04-22 ENCOUNTER — Ambulatory Visit: Payer: Medicare Other | Admitting: Physical Therapy

## 2012-04-24 ENCOUNTER — Ambulatory Visit: Payer: Medicare Other | Admitting: Physical Therapy

## 2012-04-28 ENCOUNTER — Ambulatory Visit: Payer: Medicare Other | Admitting: Physical Therapy

## 2012-04-30 ENCOUNTER — Ambulatory Visit: Payer: Medicare Other | Admitting: Rehabilitation

## 2012-05-06 ENCOUNTER — Other Ambulatory Visit: Payer: Self-pay | Admitting: Internal Medicine

## 2012-05-06 DIAGNOSIS — R634 Abnormal weight loss: Secondary | ICD-10-CM

## 2012-05-07 ENCOUNTER — Ambulatory Visit: Payer: Medicare Other | Admitting: Physical Therapy

## 2012-05-08 ENCOUNTER — Ambulatory Visit
Admission: RE | Admit: 2012-05-08 | Discharge: 2012-05-08 | Disposition: A | Discharge status: Home / Self Care | Payer: Medicare Other | Source: Ambulatory Visit | Attending: Internal Medicine | Admitting: Internal Medicine

## 2012-05-08 ENCOUNTER — Other Ambulatory Visit: Payer: Medicare Other

## 2012-05-08 DIAGNOSIS — R634 Abnormal weight loss: Secondary | ICD-10-CM

## 2012-05-08 MED ORDER — IOHEXOL 300 MG/ML  SOLN
100.0000 mL | Freq: Once | INTRAMUSCULAR | Status: AC | PRN
  Administered 2012-05-08: 100 mL via INTRAVENOUS

## 2012-05-09 ENCOUNTER — Ambulatory Visit: Payer: Medicare Other | Admitting: Physical Therapy

## 2012-05-14 ENCOUNTER — Ambulatory Visit: Payer: Medicare Other | Admitting: Physical Therapy

## 2012-05-15 ENCOUNTER — Ambulatory Visit: Payer: Medicare Other | Admitting: Physical Therapy

## 2012-05-16 ENCOUNTER — Ambulatory Visit: Payer: Medicare Other | Admitting: Physical Therapy

## 2012-05-19 ENCOUNTER — Ambulatory Visit: Payer: Medicare Other | Attending: Family Medicine | Admitting: Physical Therapy

## 2012-05-19 DIAGNOSIS — IMO0001 Reserved for inherently not codable concepts without codable children: Secondary | ICD-10-CM | POA: Insufficient documentation

## 2012-05-19 DIAGNOSIS — M25519 Pain in unspecified shoulder: Secondary | ICD-10-CM | POA: Insufficient documentation

## 2012-05-19 DIAGNOSIS — M25619 Stiffness of unspecified shoulder, not elsewhere classified: Secondary | ICD-10-CM | POA: Insufficient documentation

## 2012-05-21 ENCOUNTER — Ambulatory Visit: Payer: Medicare Other | Admitting: Physical Therapy

## 2012-05-27 ENCOUNTER — Ambulatory Visit: Payer: Medicare Other | Admitting: Physical Therapy

## 2012-05-29 ENCOUNTER — Ambulatory Visit: Payer: Medicare Other | Admitting: Physical Therapy

## 2012-06-03 ENCOUNTER — Ambulatory Visit: Payer: Medicare Other | Admitting: Physical Therapy

## 2012-06-06 ENCOUNTER — Ambulatory Visit: Payer: Medicare Other | Admitting: Physical Therapy

## 2012-06-09 ENCOUNTER — Ambulatory Visit: Payer: Medicare Other | Admitting: Physical Therapy

## 2012-06-12 ENCOUNTER — Ambulatory Visit: Payer: Medicare Other | Admitting: Physical Therapy

## 2012-07-01 MED ORDER — NITROGLYCERIN 0.2 MG/HR TD PT24: MEDICATED_PATCH | TRANSDERMAL | Status: DC

## 2012-07-01 NOTE — Addendum Note (Signed)
Addended by: Lenda Kelp on: 07/01/2012 01:21 PM   Modules accepted: Orders

## 2012-08-01 ENCOUNTER — Encounter (HOSPITAL_COMMUNITY): Payer: Self-pay | Admitting: *Deleted

## 2012-08-01 ENCOUNTER — Emergency Department (HOSPITAL_COMMUNITY)
Admission: EM | Admit: 2012-08-01 | Discharge: 2012-08-01 | Disposition: A | Discharge status: Home / Self Care | Payer: Medicare Other | Attending: Emergency Medicine | Admitting: Emergency Medicine

## 2012-08-01 DIAGNOSIS — M2550 Pain in unspecified joint: Secondary | ICD-10-CM | POA: Insufficient documentation

## 2012-08-01 DIAGNOSIS — I10 Essential (primary) hypertension: Secondary | ICD-10-CM | POA: Insufficient documentation

## 2012-08-01 DIAGNOSIS — E119 Type 2 diabetes mellitus without complications: Secondary | ICD-10-CM | POA: Insufficient documentation

## 2012-08-01 DIAGNOSIS — E785 Hyperlipidemia, unspecified: Secondary | ICD-10-CM | POA: Insufficient documentation

## 2012-08-01 DIAGNOSIS — Z79899 Other long term (current) drug therapy: Secondary | ICD-10-CM | POA: Insufficient documentation

## 2012-08-01 DIAGNOSIS — R52 Pain, unspecified: Secondary | ICD-10-CM

## 2012-08-01 DIAGNOSIS — Z7982 Long term (current) use of aspirin: Secondary | ICD-10-CM | POA: Insufficient documentation

## 2012-08-01 MED ORDER — OXYCODONE-ACETAMINOPHEN 5-325 MG PO TABS: 1.0000 | ORAL_TABLET | Freq: Four times a day (QID) | ORAL | Status: AC | PRN

## 2012-08-01 MED ORDER — OXYCODONE-ACETAMINOPHEN 5-325 MG PO TABS
1.0000 | ORAL_TABLET | Freq: Once | ORAL | Status: AC
  Administered 2012-08-01: 1 via ORAL
  Filled 2012-08-01: qty 1

## 2012-08-01 NOTE — ED Notes (Signed)
Pt reports pain in bones x 1 year. Sent to rheumatologist, has not gotten results from bloodwork back yet. Reports aching to bones all night. Took aspirin this am- reports helped pain some.

## 2012-08-01 NOTE — ED Notes (Signed)
Family at bedside. 

## 2012-08-01 NOTE — ED Provider Notes (Signed)
History     CSN: [id number]  Arrival date & time 08/01/12  1051   First MD Initiated Contact with Patient 08/01/12 1059      Chief Complaint  Patient presents with  . Joint Pain    (Consider location/radiation/quality/duration/timing/severity/associated sxs/prior treatment) The history is provided by the patient and the spouse.   patient has had joint pain throughout her body for the last year. She states her primary care Dr. Dr. August Saucer was unable to figure out what was going on and sent her to a rheumatologist. She saw the rheumatologist yesterday and states some blood was drawn. She came here today because she was unable to sleep due to pain despite taking an Ativan last night. No fevers. The joint pain will wax and wane somewhat. She states her stools are loose but improved from previous diarrhea. She has some relief of the pain with aspirin. She states she has had some weight loss over the last few months. No nausea or vomiting. No chest pain. No lightheadedness or dizziness.  Past Medical History  Diagnosis Date  . Hypertension   . Diabetes mellitus   . Physical exam, annual 10/22/06  . Anxiety   . Hyperlipidemia     Past Surgical History  Procedure Date  . Abdominal hysterectomy     No family history on file.  History  Substance Use Topics  . Smoking status: Never Smoker   . Smokeless tobacco: Not on file  . Alcohol Use: No    OB History    Grav Para Term Preterm Abortions TAB SAB Ect Mult Living                  Review of Systems  Constitutional: Positive for unexpected weight change. Negative for fever, chills and appetite change.  Respiratory: Negative for chest tightness.   Cardiovascular: Negative for chest pain.  Gastrointestinal: Negative for abdominal pain.  Genitourinary: Negative for flank pain.  Musculoskeletal: Positive for arthralgias. Negative for myalgias, back pain and joint swelling.  Skin: Negative for pallor.  Neurological: Negative for  headaches.  Hematological: Negative for adenopathy.  Psychiatric/Behavioral: Negative for confusion.    Allergies  Review of patient's allergies indicates no known allergies.  Home Medications   Current Outpatient Rx  Name Route Sig Dispense Refill  . ALPRAZOLAM 0.5 MG PO TABS Oral Take 0.5 mg by mouth at bedtime as needed. For anxiety    . ASPIRIN 325 MG PO TABS Oral Take 325 mg by mouth daily.    . ATORVASTATIN CALCIUM 20 MG PO TABS Oral Take 20 mg by mouth daily.     . CHOLESTYRAMINE 4 GM/DOSE PO POWD Oral Take 4 g by mouth 2 (two) times daily.     . ENABLEX 15 MG PO TB24 Oral Take 15 mg by mouth daily.     [name] ESTRADIOL 0.025 MG/24HR TD PTWK Transdermal Place 1 patch onto the skin once a week.    [name] GLIMEPIRIDE 4 MG PO TABS Oral Take 4 mg by mouth daily before breakfast.    . KLOR-CON M20 20 MEQ PO TBCR Oral Take 20 mEq by mouth daily.     [name] LORAZEPAM 1 MG PO TABS Oral Take 1 mg by mouth daily.     [name] METFORMIN HCL ER (OSM) 1000 MG PO TB24 Oral Take 1,000 mg by mouth daily with breakfast.    . NITROGLYCERIN 0.2 MG/HR TD PT24 Transdermal Place 0.25 patches onto the skin daily. Place 1/4th of a patch over affected  area of shoulder - change daily    . ONETOUCH ULTRA BLUE VI STRP      . ONETOUCH DELICA LANCETS MISC      . OXYCODONE-ACETAMINOPHEN 5-325 MG PO TABS Oral Take 1-2 tablets by mouth every 6 (six) hours as needed for pain. 15 tablet 0    BP 112/70  Pulse 63  Temp 97.6 F (36.4 C) (Oral)  Resp 15  SpO2 100%  Physical Exam  Nursing note and vitals reviewed. Constitutional: She is oriented to person, place, and time. She appears well-developed and well-nourished.  HENT:  Head: Normocephalic and atraumatic.  Eyes: EOM are normal. Pupils are equal, round, and reactive to light.  Neck: Normal range of motion. Neck supple.  Cardiovascular: Normal rate, regular rhythm and normal heart sounds.   No murmur heard. Pulmonary/Chest: Effort normal and breath sounds normal. No  respiratory distress. She has no wheezes. She has no rales.  Abdominal: Soft. Bowel sounds are normal. She exhibits no distension. There is no tenderness. There is no rebound and no guarding.  Musculoskeletal: Normal range of motion.  Neurological: She is alert and oriented to person, place, and time. No cranial nerve deficit.  Skin: Skin is warm and dry.  Psychiatric: She has a normal mood and affect. Her speech is normal.    ED Course  Procedures (including critical care time)  Labs Reviewed - No data to display No results found.   1. Pain       MDM   patient with pain over the last year. Has been seen by rheumatology. I discussed with Dr. Dierdre Forth. He does not believe this is a rheumatologic cause. He thinks it may be a neuropathy. Patient was started on Percocet for pain and will followup with Dr. August Saucer and Dr. Dierdre Forth.        Juliet Rude. Rubin Payor, MD 08/01/12 1235

## 2012-08-01 NOTE — ED Notes (Signed)
MD at bedside. 

## 2012-08-26 ENCOUNTER — Ambulatory Visit: Payer: Medicare Other | Attending: Family Medicine | Admitting: Physical Therapy

## 2012-08-26 DIAGNOSIS — M25619 Stiffness of unspecified shoulder, not elsewhere classified: Secondary | ICD-10-CM | POA: Insufficient documentation

## 2012-08-26 DIAGNOSIS — M25519 Pain in unspecified shoulder: Secondary | ICD-10-CM | POA: Insufficient documentation

## 2012-08-26 DIAGNOSIS — IMO0001 Reserved for inherently not codable concepts without codable children: Secondary | ICD-10-CM | POA: Insufficient documentation

## 2012-08-28 ENCOUNTER — Ambulatory Visit: Payer: Medicare Other | Admitting: Physical Therapy

## 2012-09-02 ENCOUNTER — Ambulatory Visit: Payer: Medicare Other | Admitting: Physical Therapy

## 2012-09-05 ENCOUNTER — Ambulatory Visit: Payer: Medicare Other | Admitting: Physical Therapy

## 2012-09-09 ENCOUNTER — Ambulatory Visit: Payer: Medicare Other | Admitting: Physical Therapy

## 2012-09-11 ENCOUNTER — Ambulatory Visit: Payer: Medicare Other | Admitting: Physical Therapy

## 2012-09-16 ENCOUNTER — Ambulatory Visit: Payer: Medicare Other | Attending: Family Medicine | Admitting: Physical Therapy

## 2012-09-16 DIAGNOSIS — IMO0001 Reserved for inherently not codable concepts without codable children: Secondary | ICD-10-CM | POA: Insufficient documentation

## 2012-09-16 DIAGNOSIS — M25519 Pain in unspecified shoulder: Secondary | ICD-10-CM | POA: Insufficient documentation

## 2012-09-16 DIAGNOSIS — M25619 Stiffness of unspecified shoulder, not elsewhere classified: Secondary | ICD-10-CM | POA: Insufficient documentation

## 2012-09-18 ENCOUNTER — Ambulatory Visit: Payer: Medicare Other | Admitting: Physical Therapy

## 2012-09-23 ENCOUNTER — Ambulatory Visit: Payer: Medicare Other | Admitting: Physical Therapy

## 2012-09-25 ENCOUNTER — Ambulatory Visit: Payer: Medicare Other | Admitting: Physical Therapy

## 2012-09-30 ENCOUNTER — Ambulatory Visit: Payer: Medicare Other | Admitting: Physical Therapy

## 2012-10-07 ENCOUNTER — Ambulatory Visit: Payer: Medicare Other | Admitting: Physical Therapy

## 2012-11-25 ENCOUNTER — Other Ambulatory Visit: Payer: Self-pay | Admitting: Internal Medicine

## 2012-11-25 ENCOUNTER — Ambulatory Visit
Admission: RE | Admit: 2012-11-25 | Discharge: 2012-11-25 | Disposition: A | Discharge status: Home / Self Care | Payer: Medicare Other | Source: Ambulatory Visit | Attending: Internal Medicine | Admitting: Internal Medicine

## 2012-11-25 DIAGNOSIS — W19XXXA Unspecified fall, initial encounter: Secondary | ICD-10-CM

## 2013-02-25 ENCOUNTER — Encounter (HOSPITAL_COMMUNITY): Payer: Self-pay | Admitting: *Deleted

## 2013-02-25 ENCOUNTER — Emergency Department (INDEPENDENT_AMBULATORY_CARE_PROVIDER_SITE_OTHER)
Admission: EM | Admit: 2013-02-25 | Discharge: 2013-02-25 | Disposition: A | Discharge status: Home / Self Care | Payer: Medicare Other | Source: Home / Self Care

## 2013-02-25 DIAGNOSIS — G8929 Other chronic pain: Secondary | ICD-10-CM

## 2013-02-25 NOTE — ED Provider Notes (Signed)
History     CSN: [id number]  Arrival date & time 02/25/13  1554   First MD Initiated Contact with Patient 02/25/13 1641      Chief Complaint  Patient presents with  . Abscess    (Consider location/radiation/quality/duration/timing/severity/associated sxs/prior treatment) HPI Comments: 72 year old female who presents with discomfort in the gluteal cleft for well over a year. She states that she will develop clear type lesions that burst, draining clear fluid and then he will over. This occurred 2-3 days ago. She called her physician recently and advised  that she had this lesion and was told to go to the urgent care. She has generalized discomfort in the gluteal cleft that radiates to the thighs. Denies constitutional symptoms. No other complaints.   Past Medical History  Diagnosis Date  . Hypertension   . Diabetes mellitus   . Physical exam, annual 10/22/06  . Anxiety   . Hyperlipidemia     Past Surgical History  Procedure Laterality Date  . Abdominal hysterectomy      History reviewed. No pertinent family history.  History  Substance Use Topics  . Smoking status: Never Smoker   . Smokeless tobacco: Not on file  . Alcohol Use: No    OB History   Grav Para Term Preterm Abortions TAB SAB Ect Mult Living                  Review of Systems  Constitutional: Negative.   Respiratory: Negative.   Cardiovascular: Negative.   Gastrointestinal: Negative.   Skin:       As per history of present illness    Allergies  Review of patient's allergies indicates no known allergies.  Home Medications   Current Outpatient Rx  Name  Route  Sig  Dispense  Refill  . acetaminophen (TYLENOL) 500 MG tablet   Oral   Take 500 mg by mouth every 6 (six) hours as needed for pain.         [name] ALPRAZolam (XANAX) 0.5 MG tablet   Oral   Take 0.5 mg by mouth at bedtime as needed. For anxiety         . atorvastatin (LIPITOR) 20 MG tablet   Oral   Take 20 mg by mouth daily.           . cholestyramine (QUESTRAN) 4 GM/DOSE powder   Oral   Take 4 g by mouth 2 (two) times daily.          [name] estradiol (CLIMARA - DOSED IN MG/24 HR) 0.025 mg/24hr   Transdermal   Place 1 patch onto the skin once a week.         [name] glimepiride (AMARYL) 4 MG tablet   Oral   Take 4 mg by mouth daily before breakfast.         . LORazepam (ATIVAN) 1 MG tablet   Oral   Take 1 mg by mouth daily.          . Probiotic Product (ALIGN) 4 MG CAPS   Oral   Take by mouth daily.         [name] aspirin 325 MG tablet   Oral   Take 325 mg by mouth daily.         . ENABLEX 15 MG 24 hr tablet   Oral   Take 15 mg by mouth daily.          [name] KLOR-CON M20 20 MEQ tablet   Oral   Take 20 mEq  by mouth daily.          . metformin (FORTAMET) 1000 MG (OSM) 24 hr tablet   Oral   Take 1,000 mg by mouth daily with breakfast.         . nitroGLYCERIN (NITRODUR - DOSED IN MG/24 HR) 0.2 mg/hr   Transdermal   Place 0.25 patches onto the skin daily. Place 1/4th of a patch over affected area of shoulder - change daily         . ONE TOUCH ULTRA TEST test strip               . ONETOUCH DELICA LANCETS MISC                 BP 158/70  Pulse 63  Temp(Src) 97.9 F (36.6 C) (Oral)  Resp 20  SpO2 100%  Physical Exam  Nursing note and vitals reviewed. Constitutional: She appears well-developed and well-nourished. No distress.  Neck: Normal range of motion. Neck supple.  Pulmonary/Chest: Effort normal. No respiratory distress.  Musculoskeletal: She exhibits no edema.  Neurological: She is alert. She exhibits normal muscle tone.  Skin: Skin is warm and dry.  Examination of the area of discomfort in the gluteal cleft reveals no skin changes no palpable nodules or other lesions. There are no areas of induration or discolorations. This is a normal exam.  Psychiatric: She has a normal mood and affect.    ED Course  Procedures (including critical care time)  Labs Reviewed - No  data to display No results found.   1. Chronic buttock pain       MDM  Findings on exam. The skin and gluteal cleft appear quite healthy her no skin changes or lesions observed or palpated. The patient is to call her PCP and followup with him in the office. If she needs pain medicines for this chronic condition he should be the one to prescribe them. If she needs a referral he should do that today.        Hayden Rasmussen, NP 02/25/13 1801

## 2013-02-25 NOTE — ED Notes (Signed)
C/o pain from her waist down the backs of both legs onset 1 month.  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  Has been to seen 3 doctors Dr. Willey Blade 2 weeks ago, Dr. Carman Ching 1 month ago, and Dr. at [facility] ED in Nov or Dec. He gave her an antibiotic ? Cephalexin.  It made it go away and it came back this month.  States its painful today.

## 2013-02-25 NOTE — ED Provider Notes (Signed)
Medical screening examination/treatment/procedure(s) were performed by resident physician or non-physician practitioner and as supervising physician I was immediately available for consultation/collaboration.   Gilad Dugger DOUGLAS MD.   Olivea Sonnen D Eleanna Theilen, MD 02/25/13 2043 

## 2013-03-30 ENCOUNTER — Other Ambulatory Visit: Payer: Self-pay | Admitting: Orthopaedic Surgery

## 2013-03-30 DIAGNOSIS — M25561 Pain in right knee: Secondary | ICD-10-CM

## 2013-04-04 ENCOUNTER — Other Ambulatory Visit: Payer: Self-pay

## 2013-04-04 ENCOUNTER — Ambulatory Visit
Admission: RE | Admit: 2013-04-04 | Discharge: 2013-04-04 | Disposition: A | Discharge status: Home / Self Care | Payer: Medicare Other | Source: Ambulatory Visit | Attending: Orthopaedic Surgery | Admitting: Orthopaedic Surgery

## 2013-04-04 DIAGNOSIS — M25561 Pain in right knee: Secondary | ICD-10-CM

## 2013-05-01 ENCOUNTER — Encounter: Payer: Self-pay | Admitting: Internal Medicine

## 2013-08-11 ENCOUNTER — Encounter (HOSPITAL_COMMUNITY): Payer: Self-pay | Admitting: *Deleted

## 2013-08-11 ENCOUNTER — Emergency Department (INDEPENDENT_AMBULATORY_CARE_PROVIDER_SITE_OTHER)
Admission: EM | Admit: 2013-08-11 | Discharge: 2013-08-11 | Disposition: A | Discharge status: Home / Self Care | Payer: Medicare Other | Source: Home / Self Care

## 2013-08-11 DIAGNOSIS — J029 Acute pharyngitis, unspecified: Secondary | ICD-10-CM

## 2013-08-11 LAB — POCT RAPID STREP A: Streptococcus, Group A Screen (Direct): NEGATIVE

## 2013-08-11 NOTE — ED Provider Notes (Signed)
Jacqueline Ball is a 72 y.o. female who presents to Urgent Care today for sore throat since Sunday. Patient is also notes a runny nose congestion coughing and mild fever. She denies any trouble breathing chest pains or palpitations. She notes that she recently has been treated for both otitis media and a urinary tract infection with antibiotics. She is not sure she has finished her antibiotics for the UTI yet. She's not taking any medications for symptomatic relief of sore throat. She feels well otherwise. No nausea vomiting or diarrhea   PMH reviewed. Diabetes History  Substance Use Topics  . Smoking status: Never Smoker   . Smokeless tobacco: Not on file  . Alcohol Use: No   ROS as above Medications reviewed. No current facility-administered medications for this encounter.   Current Outpatient Prescriptions  Medication Sig Dispense Refill  . acetaminophen (TYLENOL) 500 MG tablet Take 500 mg by mouth every 6 (six) hours as needed for pain.      [name] ALPRAZolam (XANAX) 0.5 MG tablet Take 0.5 mg by mouth at bedtime as needed. For anxiety      . aspirin 325 MG tablet Take 325 mg by mouth daily.      [name] atorvastatin (LIPITOR) 20 MG tablet Take 20 mg by mouth daily.       . cholestyramine (QUESTRAN) 4 GM/DOSE powder Take 4 g by mouth 2 (two) times daily.       . ENABLEX 15 MG 24 hr tablet Take 15 mg by mouth daily.       [name] estradiol (CLIMARA - DOSED IN MG/24 HR) 0.025 mg/24hr Place 1 patch onto the skin once a week.      [name] glimepiride (AMARYL) 4 MG tablet Take 4 mg by mouth daily before breakfast.      . KLOR-CON M20 20 MEQ tablet Take 20 mEq by mouth daily.       [name] LORazepam (ATIVAN) 1 MG tablet Take 1 mg by mouth daily.       . metformin (FORTAMET) 1000 MG (OSM) 24 hr tablet Take 1,000 mg by mouth daily with breakfast.      . nitroGLYCERIN (NITRODUR - DOSED IN MG/24 HR) 0.2 mg/hr Place 0.25 patches onto the skin daily. Place 1/4th of a patch over affected area of shoulder - change daily      . ONE  TOUCH ULTRA TEST test strip       . ONETOUCH DELICA LANCETS MISC       . Probiotic Product (ALIGN) 4 MG CAPS Take by mouth daily.        Exam:  BP 122/59  Pulse 81  Temp(Src) 98.3 F (36.8 C) (Oral)  Resp 20  SpO2 100% Gen: Well NAD HEENT: EOMI,  MMM, cobblestoning of posterior pharynx. Mild erythema. Tympanic membranes are normal appearing bilaterally Lungs: CTABL Nl WOB Heart: RRR no MRG Abd: NABS, NT, ND Exts: Non edematous BL  LE, warm and well perfused.   Results for orders placed during the hospital encounter of 08/11/13 (from the past 24 hour(s))  POCT RAPID STREP A ([facility] ONLY)     Status: None   Collection Time    08/11/13 12:43 PM      Result Value Range   Streptococcus, Group A Screen (Direct) NEGATIVE  NEGATIVE   No results found.  Assessment and Plan: 72 y.o. female with viral pharyngitis.  Alternate diagnoses include esophageal candidiasis as patient has had recent antibiotic exposure as however I see no evidence of  that on exam today.  Plan to treat symptomatically with Tylenol or ibuprofen.  Followup with primary care provider if not improving. \ Discussed warning signs or symptoms. Please see discharge instructions. Patient expresses understanding.       Rodolph Bong, MD 08/11/13 [phone]

## 2013-08-11 NOTE — ED Notes (Signed)
Pt  Reports   Symptoms  Of  sorethroat    X  1  Week    Put  States  Finished   Anti biotics  Still  Having a  sorethroat     Pt      Reports        Also   Seen  Recently  For a  uti  As  Well

## 2013-08-13 LAB — CULTURE, GROUP A STREP

## 2013-11-04 ENCOUNTER — Emergency Department (HOSPITAL_COMMUNITY): Payer: Medicare Other

## 2013-11-04 ENCOUNTER — Encounter (HOSPITAL_COMMUNITY): Payer: Self-pay | Admitting: Emergency Medicine

## 2013-11-04 ENCOUNTER — Emergency Department (HOSPITAL_COMMUNITY)
Admission: EM | Admit: 2013-11-04 | Discharge: 2013-11-04 | Disposition: A | Discharge status: Home / Self Care | Payer: Medicare Other | Attending: Emergency Medicine | Admitting: Emergency Medicine

## 2013-11-04 DIAGNOSIS — R05 Cough: Secondary | ICD-10-CM | POA: Insufficient documentation

## 2013-11-04 DIAGNOSIS — I10 Essential (primary) hypertension: Secondary | ICD-10-CM | POA: Insufficient documentation

## 2013-11-04 DIAGNOSIS — L989 Disorder of the skin and subcutaneous tissue, unspecified: Secondary | ICD-10-CM | POA: Insufficient documentation

## 2013-11-04 DIAGNOSIS — E119 Type 2 diabetes mellitus without complications: Secondary | ICD-10-CM | POA: Insufficient documentation

## 2013-11-04 DIAGNOSIS — R197 Diarrhea, unspecified: Secondary | ICD-10-CM | POA: Insufficient documentation

## 2013-11-04 DIAGNOSIS — R634 Abnormal weight loss: Secondary | ICD-10-CM | POA: Insufficient documentation

## 2013-11-04 DIAGNOSIS — M542 Cervicalgia: Secondary | ICD-10-CM | POA: Insufficient documentation

## 2013-11-04 DIAGNOSIS — F411 Generalized anxiety disorder: Secondary | ICD-10-CM | POA: Insufficient documentation

## 2013-11-04 DIAGNOSIS — Z79899 Other long term (current) drug therapy: Secondary | ICD-10-CM | POA: Insufficient documentation

## 2013-11-04 DIAGNOSIS — M25512 Pain in left shoulder: Secondary | ICD-10-CM

## 2013-11-04 DIAGNOSIS — E785 Hyperlipidemia, unspecified: Secondary | ICD-10-CM | POA: Insufficient documentation

## 2013-11-04 DIAGNOSIS — Z7982 Long term (current) use of aspirin: Secondary | ICD-10-CM | POA: Insufficient documentation

## 2013-11-04 DIAGNOSIS — R059 Cough, unspecified: Secondary | ICD-10-CM | POA: Insufficient documentation

## 2013-11-04 LAB — CBC WITH DIFFERENTIAL/PLATELET
Basophils Absolute: 0 K/uL (ref 0.0–0.1)
Basophils Relative: 0 % (ref 0–1)
Eosinophils Absolute: 0 K/uL (ref 0.0–0.7)
Eosinophils Relative: 0 % (ref 0–5)
HCT: 38.1 % (ref 36.0–46.0)
Hemoglobin: 12.9 g/dL (ref 12.0–15.0)
Lymphocytes Relative: 45 % (ref 12–46)
Lymphs Abs: 2.3 K/uL (ref 0.7–4.0)
MCH: 32.7 pg (ref 26.0–34.0)
MCHC: 33.9 g/dL (ref 30.0–36.0)
MCV: 96.5 fL (ref 78.0–100.0)
Monocytes Absolute: 0.7 K/uL (ref 0.1–1.0)
Monocytes Relative: 13 % — ABNORMAL HIGH (ref 3–12)
Neutro Abs: 2.2 K/uL (ref 1.7–7.7)
Neutrophils Relative %: 42 % — ABNORMAL LOW (ref 43–77)
Platelets: 294 K/uL (ref 150–400)
RBC: 3.95 MIL/uL (ref 3.87–5.11)
RDW: 12.7 % (ref 11.5–15.5)
WBC: 5.2 K/uL (ref 4.0–10.5)

## 2013-11-04 LAB — BASIC METABOLIC PANEL WITH GFR
BUN: 7 mg/dL (ref 6–23)
CO2: 24 meq/L (ref 19–32)
Calcium: 9.1 mg/dL (ref 8.4–10.5)
Chloride: 108 meq/L (ref 96–112)
Creatinine, Ser: 0.91 mg/dL (ref 0.50–1.10)
GFR calc Af Amer: 72 mL/min — ABNORMAL LOW
GFR calc non Af Amer: 62 mL/min — ABNORMAL LOW
Glucose, Bld: 61 mg/dL — ABNORMAL LOW (ref 70–99)
Potassium: 3.6 meq/L (ref 3.5–5.1)
Sodium: 142 meq/L (ref 135–145)

## 2013-11-04 MED ORDER — ACETAMINOPHEN 325 MG PO TABS
650.0000 mg | ORAL_TABLET | Freq: Once | ORAL | Status: AC
  Administered 2013-11-04: 650 mg via ORAL
  Filled 2013-11-04: qty 2

## 2013-11-04 NOTE — ED Notes (Signed)
Neck pain rads down back  rt leg x a couple of weeks and boils in between her buttocks x 2 years

## 2013-11-04 NOTE — ED Provider Notes (Signed)
CSN: [id number]     Arrival date & time 11/04/13  1057 History   First MD Initiated Contact with Patient 11/04/13 1151     Chief Complaint  Patient presents with  . Wound Infection  . Neck Pain   (Consider location/radiation/quality/duration/timing/severity/associated sxs/prior Treatment) HPI Comments: 72 year old female presents with multiple complaints. Her primary complaint is "neck pain". When describing where this pain is she points to her left clavicle. She states has been on constantly for a week. She states the pain seems to radiate from her clavicle up to her head and down her back to her left leg. Denies any weakness or numbness. Denies any fevers or chills. She has had weight loss over the past year. Denies any smoking or alcohol use. Denies any exertional chest pain. She's been taking Tylenol at night which seems off the pain but has not been taking it during the day. Denies any shortness of breath. She is currently on cholestyramine since her last colonoscopy. She states to this she's not been having irregular bowel movements but recently been having diarrhea which is concerning her. Denies any blood. She also is complaining of swelling and "boils" near her buttocks. She states she told this to her gastroenteritis her PCP but they have been unable to diagnose what it is. States they've been uncomfortable.   Past Medical History  Diagnosis Date  . Hypertension   . Diabetes mellitus   . Physical exam, annual 10/22/06  . Anxiety   . Hyperlipidemia    Past Surgical History  Procedure Laterality Date  . Abdominal hysterectomy     No family history on file. History  Substance Use Topics  . Smoking status: Never Smoker   . Smokeless tobacco: Not on file  . Alcohol Use: No   OB History   Grav Para Term Preterm Abortions TAB SAB Ect Mult Living                 Review of Systems  Constitutional: Positive for unexpected weight change. Negative for fever and diaphoresis.    Respiratory: Positive for cough. Negative for shortness of breath.   Cardiovascular: Negative for chest pain.  Gastrointestinal: Negative for nausea, vomiting and abdominal pain.  Musculoskeletal: Positive for neck pain. Negative for joint swelling and neck stiffness.  Neurological: Negative for weakness and numbness.  All other systems reviewed and are negative.    Allergies  Review of patient's allergies indicates no known allergies.  Home Medications   Current Outpatient Rx  Name  Route  Sig  Dispense  Refill  . acetaminophen (TYLENOL) 500 MG tablet   Oral   Take 500 mg by mouth every 6 (six) hours as needed for pain.         [name] ALPRAZolam (XANAX) 0.5 MG tablet   Oral   Take 0.5 mg by mouth at bedtime as needed. For anxiety         . aspirin 325 MG tablet   Oral   Take 325 mg by mouth daily.         [name] atorvastatin (LIPITOR) 20 MG tablet   Oral   Take 20 mg by mouth daily.          . cholestyramine (QUESTRAN) 4 GM/DOSE powder   Oral   Take 4 g by mouth 2 (two) times daily.          . ENABLEX 15 MG 24 hr tablet   Oral   Take 15 mg by mouth daily.          [name]  estradiol (CLIMARA - DOSED IN MG/24 HR) 0.025 mg/24hr   Transdermal   Place 1 patch onto the skin once a week.         [name] glimepiride (AMARYL) 4 MG tablet   Oral   Take 4 mg by mouth daily before breakfast.         . KLOR-CON M20 20 MEQ tablet   Oral   Take 20 mEq by mouth daily.          [name] LORazepam (ATIVAN) 1 MG tablet   Oral   Take 1 mg by mouth daily.          . metformin (FORTAMET) 1000 MG (OSM) 24 hr tablet   Oral   Take 1,000 mg by mouth daily with breakfast.         . nitroGLYCERIN (NITRODUR - DOSED IN MG/24 HR) 0.2 mg/hr   Transdermal   Place 0.25 patches onto the skin daily. Place 1/4th of a patch over affected area of shoulder - change daily         . ONE TOUCH ULTRA TEST test strip               . ONETOUCH DELICA LANCETS MISC               . Probiotic  Product (ALIGN) 4 MG CAPS   Oral   Take by mouth daily.          BP 113/68  Pulse 76  Temp(Src) 97.7 F (36.5 C) (Oral)  Resp 16  Wt 144 lb 9.6 oz (65.59 kg)  SpO2 100% Physical Exam  Nursing note and vitals reviewed. Constitutional: She is oriented to person, place, and time. She appears well-developed and well-nourished. No distress.  HENT:  Head: Normocephalic and atraumatic.  Right Ear: External ear normal.  Left Ear: External ear normal.  Nose: Nose normal.  Mouth/Throat: Oropharynx is clear and moist.  Eyes: EOM are normal. Pupils are equal, round, and reactive to light.  Neck: Neck supple.  Cardiovascular: Normal rate, regular rhythm, normal heart sounds and intact distal pulses.   Pulmonary/Chest: Effort normal and breath sounds normal. She has no wheezes. She has no rales.    Abdominal: Soft. She exhibits no distension. There is no tenderness.  Genitourinary:  Normal external exam near her buttocks  Musculoskeletal: She exhibits no edema.  Lymphadenopathy:    She has no cervical adenopathy.  Neurological: She is alert and oriented to person, place, and time. She has normal strength. No cranial nerve deficit or sensory deficit. She exhibits normal muscle tone. Gait normal. GCS eye subscore is 4. GCS verbal subscore is 5. GCS motor subscore is 6.  Skin: Skin is warm and dry. She is not diaphoretic. No pallor.    ED Course  Procedures (including critical care time) Labs Review Labs Reviewed  CBC WITH DIFFERENTIAL - Abnormal; Notable for the following:    Neutrophils Relative % 42 (*)    Monocytes Relative 13 (*)    All other components within normal limits  BASIC METABOLIC PANEL - Abnormal; Notable for the following:    Glucose, Bld 61 (*)    GFR calc non Af Amer 62 (*)    GFR calc Af Amer 72 (*)    All other components within normal limits  POCT I-STAT TROPONIN I   Imaging Review Dg Chest 2 View  11/04/2013   CLINICAL DATA:  Left clavicular region pain.   EXAM: CHEST  2 VIEW  COMPARISON:  Chest CT 01/23/2012.  Chest x-ray 01/05/2010.  FINDINGS: Mediastinum and hilar structures are normal. Lungs are clear. No evidence of pleural effusion or pneumothorax. Cardiac structures are unremarkable. Degenerative changes thoracic spine. Mild degenerative changes both shoulders. No focal osseous abnormality otherwise noted. Specifically the left clavicle is normal.  IMPRESSION: 1. No acute cardiopulmonary disease. 2. Specifically the left clavicle is normal.   Electronically Signed   By: Maisie Fus  Register   On: 11/04/2013 13:37    EKG Interpretation   None       MDM   1. Clavicle pain, left    Patient's pain seems to be localized at her clavicle. There no bony deformities noted. Would be very atypical for ACS and thus I feel it is very unlikely. The symptoms she's feeling around her anus could be consistent with a pilonidal cyst. However I do not feel any actual cyst, swelling, or masses. 4 for her back to her PCP for further evaluation. Otherwise there are no emergent findings of the workup here. We'll encourage close followup with PCP for outpatient management of her symptoms.    Audree Camel, MD 11/04/13 [phone]

## 2013-11-10 ENCOUNTER — Emergency Department (HOSPITAL_COMMUNITY): Payer: Medicare Other

## 2013-11-10 ENCOUNTER — Emergency Department (HOSPITAL_COMMUNITY)
Admission: EM | Admit: 2013-11-10 | Discharge: 2013-11-10 | Disposition: A | Discharge status: Home / Self Care | Payer: Medicare Other | Attending: Emergency Medicine | Admitting: Emergency Medicine

## 2013-11-10 ENCOUNTER — Encounter (HOSPITAL_COMMUNITY): Payer: Self-pay | Admitting: Emergency Medicine

## 2013-11-10 DIAGNOSIS — E785 Hyperlipidemia, unspecified: Secondary | ICD-10-CM | POA: Insufficient documentation

## 2013-11-10 DIAGNOSIS — S63609A Unspecified sprain of unspecified thumb, initial encounter: Secondary | ICD-10-CM

## 2013-11-10 DIAGNOSIS — Y92410 Unspecified street and highway as the place of occurrence of the external cause: Secondary | ICD-10-CM | POA: Insufficient documentation

## 2013-11-10 DIAGNOSIS — Y9389 Activity, other specified: Secondary | ICD-10-CM | POA: Insufficient documentation

## 2013-11-10 DIAGNOSIS — T148XXA Other injury of unspecified body region, initial encounter: Secondary | ICD-10-CM

## 2013-11-10 DIAGNOSIS — F411 Generalized anxiety disorder: Secondary | ICD-10-CM | POA: Insufficient documentation

## 2013-11-10 DIAGNOSIS — S161XXA Strain of muscle, fascia and tendon at neck level, initial encounter: Secondary | ICD-10-CM

## 2013-11-10 DIAGNOSIS — E119 Type 2 diabetes mellitus without complications: Secondary | ICD-10-CM | POA: Insufficient documentation

## 2013-11-10 DIAGNOSIS — Z7982 Long term (current) use of aspirin: Secondary | ICD-10-CM | POA: Insufficient documentation

## 2013-11-10 DIAGNOSIS — S139XXA Sprain of joints and ligaments of unspecified parts of neck, initial encounter: Secondary | ICD-10-CM | POA: Insufficient documentation

## 2013-11-10 DIAGNOSIS — I10 Essential (primary) hypertension: Secondary | ICD-10-CM | POA: Insufficient documentation

## 2013-11-10 DIAGNOSIS — S63659A Sprain of metacarpophalangeal joint of unspecified finger, initial encounter: Secondary | ICD-10-CM | POA: Insufficient documentation

## 2013-11-10 DIAGNOSIS — Z79899 Other long term (current) drug therapy: Secondary | ICD-10-CM | POA: Insufficient documentation

## 2013-11-10 MED ORDER — TRAMADOL HCL 50 MG PO TABS: 50.0000 mg | ORAL_TABLET | Freq: Four times a day (QID) | ORAL | Status: DC | PRN

## 2013-11-10 NOTE — ED Notes (Signed)
Pt alert and oriented x4. Respirations even and unlabored, bilateral symmetrical rise and fall of chest. Skin warm and dry. In no acute distress. Denies needs.   Pt given sandwhich and drink

## 2013-11-10 NOTE — ED Notes (Signed)
Bed: RU04 Expected date:  Expected time:  Means of arrival:  Comments: [id number] y/o-restrained
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
## 2013-11-27 ENCOUNTER — Ambulatory Visit: Payer: Medicare Other | Attending: Specialist | Admitting: Physical Therapy

## 2013-11-27 DIAGNOSIS — M25519 Pain in unspecified shoulder: Secondary | ICD-10-CM | POA: Insufficient documentation

## 2013-11-27 DIAGNOSIS — IMO0001 Reserved for inherently not codable concepts without codable children: Secondary | ICD-10-CM | POA: Insufficient documentation

## 2013-11-27 DIAGNOSIS — M542 Cervicalgia: Secondary | ICD-10-CM | POA: Insufficient documentation

## 2013-12-01 ENCOUNTER — Ambulatory Visit: Payer: Medicare Other | Admitting: Physical Therapy

## 2013-12-04 ENCOUNTER — Ambulatory Visit: Payer: Medicare Other | Admitting: Physical Therapy

## 2013-12-07 ENCOUNTER — Ambulatory Visit: Payer: Medicare Other | Admitting: Physical Therapy

## 2013-12-08 ENCOUNTER — Ambulatory Visit: Payer: Medicare Other | Admitting: Physical Therapy

## 2013-12-09 ENCOUNTER — Ambulatory Visit: Payer: Medicare Other | Admitting: Physical Therapy

## 2013-12-15 ENCOUNTER — Ambulatory Visit: Payer: Medicare Other | Admitting: Physical Therapy

## 2013-12-21 ENCOUNTER — Ambulatory Visit: Payer: Medicare HMO | Attending: Specialist | Admitting: Physical Therapy

## 2013-12-21 DIAGNOSIS — M25519 Pain in unspecified shoulder: Secondary | ICD-10-CM | POA: Insufficient documentation

## 2013-12-21 DIAGNOSIS — M542 Cervicalgia: Secondary | ICD-10-CM | POA: Insufficient documentation

## 2013-12-21 DIAGNOSIS — IMO0001 Reserved for inherently not codable concepts without codable children: Secondary | ICD-10-CM | POA: Insufficient documentation

## 2013-12-23 ENCOUNTER — Ambulatory Visit: Payer: Medicare HMO | Admitting: Physical Therapy

## 2013-12-28 ENCOUNTER — Ambulatory Visit: Payer: Medicare HMO | Admitting: Physical Therapy

## 2013-12-30 ENCOUNTER — Ambulatory Visit: Payer: Medicare HMO | Admitting: Physical Therapy

## 2014-06-08 ENCOUNTER — Ambulatory Visit: Payer: Medicare HMO | Attending: Specialist | Admitting: Physical Therapy

## 2014-06-08 DIAGNOSIS — IMO0001 Reserved for inherently not codable concepts without codable children: Secondary | ICD-10-CM | POA: Insufficient documentation

## 2014-06-08 DIAGNOSIS — M25519 Pain in unspecified shoulder: Secondary | ICD-10-CM | POA: Insufficient documentation

## 2014-06-08 DIAGNOSIS — M25539 Pain in unspecified wrist: Secondary | ICD-10-CM | POA: Insufficient documentation

## 2014-06-14 ENCOUNTER — Ambulatory Visit: Payer: Medicare HMO | Admitting: Physical Therapy

## 2014-06-15 ENCOUNTER — Ambulatory Visit: Payer: Medicare HMO | Admitting: Physical Therapy

## 2014-06-21 ENCOUNTER — Ambulatory Visit: Payer: Medicare HMO | Attending: Specialist | Admitting: Physical Therapy

## 2014-06-21 DIAGNOSIS — M25519 Pain in unspecified shoulder: Secondary | ICD-10-CM | POA: Diagnosis not present

## 2014-06-21 DIAGNOSIS — IMO0001 Reserved for inherently not codable concepts without codable children: Secondary | ICD-10-CM | POA: Diagnosis present

## 2014-06-21 DIAGNOSIS — M25539 Pain in unspecified wrist: Secondary | ICD-10-CM | POA: Insufficient documentation

## 2014-06-23 ENCOUNTER — Ambulatory Visit: Payer: Medicare HMO | Admitting: Physical Therapy

## 2014-06-23 DIAGNOSIS — IMO0001 Reserved for inherently not codable concepts without codable children: Secondary | ICD-10-CM | POA: Diagnosis not present

## 2014-06-29 ENCOUNTER — Ambulatory Visit: Payer: Medicare HMO | Admitting: Physical Therapy

## 2014-06-29 DIAGNOSIS — IMO0001 Reserved for inherently not codable concepts without codable children: Secondary | ICD-10-CM | POA: Diagnosis not present

## 2014-07-01 ENCOUNTER — Ambulatory Visit: Payer: Medicare HMO | Admitting: Physical Therapy

## 2014-07-01 DIAGNOSIS — IMO0001 Reserved for inherently not codable concepts without codable children: Secondary | ICD-10-CM | POA: Diagnosis not present

## 2014-07-06 ENCOUNTER — Ambulatory Visit: Payer: Medicare HMO | Admitting: Physical Therapy

## 2014-07-06 DIAGNOSIS — IMO0001 Reserved for inherently not codable concepts without codable children: Secondary | ICD-10-CM | POA: Diagnosis not present

## 2014-07-08 ENCOUNTER — Ambulatory Visit: Payer: Medicare HMO | Admitting: Physical Therapy

## 2014-07-08 DIAGNOSIS — IMO0001 Reserved for inherently not codable concepts without codable children: Secondary | ICD-10-CM | POA: Diagnosis not present

## 2014-07-14 ENCOUNTER — Ambulatory Visit: Payer: Medicare HMO | Admitting: Physical Therapy

## 2014-07-14 DIAGNOSIS — IMO0001 Reserved for inherently not codable concepts without codable children: Secondary | ICD-10-CM | POA: Diagnosis not present

## 2014-07-21 ENCOUNTER — Ambulatory Visit: Payer: Medicare HMO | Attending: Specialist | Admitting: Physical Therapy

## 2014-07-21 DIAGNOSIS — M25539 Pain in unspecified wrist: Secondary | ICD-10-CM | POA: Insufficient documentation

## 2014-07-21 DIAGNOSIS — IMO0001 Reserved for inherently not codable concepts without codable children: Secondary | ICD-10-CM | POA: Insufficient documentation

## 2014-07-21 DIAGNOSIS — M25519 Pain in unspecified shoulder: Secondary | ICD-10-CM | POA: Diagnosis not present

## 2014-07-23 ENCOUNTER — Ambulatory Visit: Payer: Medicare HMO | Admitting: Physical Therapy

## 2014-07-29 ENCOUNTER — Ambulatory Visit: Payer: Medicare HMO | Admitting: Physical Therapy

## 2014-08-03 ENCOUNTER — Ambulatory Visit: Payer: Medicare HMO | Admitting: Physical Therapy

## 2014-08-03 DIAGNOSIS — IMO0001 Reserved for inherently not codable concepts without codable children: Secondary | ICD-10-CM | POA: Diagnosis not present

## 2014-08-06 ENCOUNTER — Ambulatory Visit: Payer: Medicare HMO | Admitting: Physical Therapy

## 2014-08-10 ENCOUNTER — Ambulatory Visit: Payer: Medicare HMO | Admitting: Physical Therapy

## 2014-08-10 DIAGNOSIS — IMO0001 Reserved for inherently not codable concepts without codable children: Secondary | ICD-10-CM | POA: Diagnosis not present

## 2014-08-12 ENCOUNTER — Ambulatory Visit: Payer: Medicare HMO | Admitting: Physical Therapy

## 2014-08-13 ENCOUNTER — Encounter: Payer: Medicare HMO | Admitting: Physical Therapy

## 2014-10-27 ENCOUNTER — Encounter (HOSPITAL_COMMUNITY): Payer: Self-pay | Admitting: *Deleted

## 2014-10-27 ENCOUNTER — Emergency Department (INDEPENDENT_AMBULATORY_CARE_PROVIDER_SITE_OTHER)
Admission: EM | Admit: 2014-10-27 | Discharge: 2014-10-27 | Disposition: A | Discharge status: Home / Self Care | Payer: Medicare HMO | Source: Home / Self Care | Attending: Emergency Medicine | Admitting: Emergency Medicine

## 2014-10-27 DIAGNOSIS — S86911A Strain of unspecified muscle(s) and tendon(s) at lower leg level, right leg, initial encounter: Secondary | ICD-10-CM

## 2014-10-27 DIAGNOSIS — S86811A Strain of other muscle(s) and tendon(s) at lower leg level, right leg, initial encounter: Secondary | ICD-10-CM

## 2014-10-27 MED ORDER — HYDROCODONE-ACETAMINOPHEN 5-325 MG PO TABS: 1.0000 | ORAL_TABLET | Freq: Four times a day (QID) | ORAL | Status: DC | PRN

## 2014-10-27 MED ORDER — MELOXICAM 7.5 MG PO TABS: 7.5000 mg | ORAL_TABLET | Freq: Every day | ORAL | Status: DC

## 2014-10-27 NOTE — Discharge Instructions (Signed)
I think you sprained your knee. Rest it as much as you can for the next few days. Apply ice 2-3 times a day. Take meloxicam 1 pill daily for 7 days, then as needed. Use the Norco as needed for severe pain.  If it is not improving by Monday, call your orthopedic doctor for an appointment.

## 2014-10-27 NOTE — ED Notes (Signed)
Pt  Reports  Pain  In  Her  r  Knee     That  Started  Today     - she  denys any  specefic injury      -   She  Reports      Pain  On   Weight  Bearing    She    denys      Any  Other  Symptoms     And  Is   Sitting     Upright on  The  Exam table  Speaking  In     Complete   sentances

## 2014-10-27 NOTE — ED Provider Notes (Signed)
CSN: [id number]     Arrival date & time 10/27/14  1621 History   First MD Initiated Contact with Patient 10/27/14 1639     Chief Complaint  Patient presents with  . Knee Pain   (Consider location/radiation/quality/duration/timing/severity/associated sxs/prior Treatment) HPI  She is a 73 year old woman here for evaluation of right knee pain. She states the pain started today. It is located in the front of the knee and goes all the way down the front of her leg. She states it feels like something is twisted in her knee. She denies any injury or trauma. She does go up and down a flight of stairs multiple times a day. She denies any locking, clicking or popping. She states it does seem a little swollen. Denies any weakness.  Past Medical History  Diagnosis Date  . Hypertension   . Diabetes mellitus   . Physical exam, annual 10/22/06  . Anxiety   . Hyperlipidemia    Past Surgical History  Procedure Laterality Date  . Abdominal hysterectomy     History reviewed. No pertinent family history. History  Substance Use Topics  . Smoking status: Never Smoker   . Smokeless tobacco: Not on file  . Alcohol Use: No   OB History    No data available     Review of Systems  Musculoskeletal:       Right knee pain    Allergies  Review of patient's allergies indicates no known allergies.  Home Medications   Prior to Admission medications   Medication Sig Start Date End Date Taking? Authorizing Provider  acetaminophen (TYLENOL) 500 MG tablet Take 500 mg by mouth every 6 (six) hours as needed for pain.    Historical Provider, MD  ALPRAZolam Prudy Feeler(XANAX) 0.5 MG tablet Take 0.5 mg by mouth at bedtime as needed. For anxiety    Historical Provider, MD  aspirin 325 MG tablet Take 325 mg by mouth daily.    Historical Provider, MD  atorvastatin (LIPITOR) 20 MG tablet Take 20 mg by mouth daily.  03/26/12   Historical Provider, MD  cholestyramine Lanetta Inch(QUESTRAN) 4 GM/DOSE powder Take 4 g by mouth 2 (two) times  daily.  05/28/12   Historical Provider, MD  COLCRYS 0.6 MG tablet Take 0.6 mg by mouth daily. 11/02/13   Historical Provider, MD  ENABLEX 15 MG 24 hr tablet Take 15 mg by mouth daily.  05/30/12   Historical Provider, MD  estradiol (CLIMARA - DOSED IN MG/24 HR) 0.025 mg/24hr Place 1 patch onto the skin once a week.    Historical Provider, MD  glimepiride (AMARYL) 4 MG tablet Take 4 mg by mouth daily before breakfast.    Historical Provider, MD  HYDROcodone-acetaminophen (NORCO) 5-325 MG per tablet Take 1 tablet by mouth every 6 (six) hours as needed for moderate pain. 10/27/14   Charm RingsErin J Areana Kosanke, MD  LORazepam (ATIVAN) 1 MG tablet Take 1 mg by mouth daily.  05/27/12   Historical Provider, MD  meloxicam (MOBIC) 7.5 MG tablet Take 1 tablet (7.5 mg total) by mouth daily. 10/27/14   Charm RingsErin J Deann Mclaine, MD  NEXIUM 40 MG capsule Take 40 mg by mouth daily. 11/02/13   Historical Provider, MD  nitroGLYCERIN (NITRODUR - DOSED IN MG/24 HR) 0.2 mg/hr Place 0.25 patches onto the skin daily. Place 1/4th of a patch over affected area of shoulder - change daily 07/01/12   Lenda KelpShane R Hudnall, MD  Probiotic Product (ALIGN) 4 MG CAPS Take by mouth daily.    Historical Provider, MD  traMADol (ULTRAM) 50 MG tablet Take 1 tablet (50 mg total) by mouth every 6 (six) hours as needed. 11/10/13   Gilda Creasehristopher J. Pollina, MD   BP 121/77 mmHg  Pulse 88  Temp(Src) 98.2 F (36.8 C) (Oral)  SpO2 97% Physical Exam  Constitutional: She is oriented to person, place, and time. She appears well-developed and well-nourished. No distress.  Cardiovascular: Normal rate.   Pulmonary/Chest: Effort normal.  Musculoskeletal:  Right knee: mild swelling on lateral aspect, no erythema.  No joint effusion.  Tender lateral to patellar tendon and along peroneal muscles. McMurrays negative.  No joint laxity.  5/5 quad and hamstring strength.  Neurological: She is alert and oriented to person, place, and time.    ED Course  Procedures (including critical  care time) Labs Review Labs Reviewed - No data to display  Imaging Review No results found.   MDM   1. Knee strain, right, initial encounter    I suspect she strained her knee going up and down the stairs. There is no swelling or calf tenderness to suggest DVT. We'll treat with conservative management. Rest as much as possible, ice 2-3 times a day. Meloxicam 7.5 mg daily for the next week then as needed. Norco provided to use as needed for severe pain. If no improvement on Monday, she is to call her orthopedic doctor for an appointment.    Charm RingsErin J Evangelene Vora, MD 10/27/14 [phone]

## 2014-11-22 ENCOUNTER — Emergency Department (HOSPITAL_COMMUNITY): Payer: Medicare HMO

## 2014-11-22 ENCOUNTER — Inpatient Hospital Stay (HOSPITAL_COMMUNITY)
Admission: EM | Admit: 2014-11-22 | Discharge: 2014-11-24 | DRG: 310 | Disposition: A | Discharge status: Home / Self Care | Payer: Medicare HMO | Attending: Cardiology | Admitting: Cardiology

## 2014-11-22 ENCOUNTER — Encounter (HOSPITAL_COMMUNITY): Payer: Self-pay

## 2014-11-22 DIAGNOSIS — R079 Chest pain, unspecified: Secondary | ICD-10-CM

## 2014-11-22 DIAGNOSIS — Z23 Encounter for immunization: Secondary | ICD-10-CM | POA: Diagnosis not present

## 2014-11-22 DIAGNOSIS — Z79899 Other long term (current) drug therapy: Secondary | ICD-10-CM | POA: Diagnosis not present

## 2014-11-22 DIAGNOSIS — I4892 Unspecified atrial flutter: Secondary | ICD-10-CM | POA: Diagnosis present

## 2014-11-22 DIAGNOSIS — R0600 Dyspnea, unspecified: Secondary | ICD-10-CM

## 2014-11-22 DIAGNOSIS — I10 Essential (primary) hypertension: Secondary | ICD-10-CM | POA: Diagnosis present

## 2014-11-22 DIAGNOSIS — M1000 Idiopathic gout, unspecified site: Secondary | ICD-10-CM | POA: Diagnosis present

## 2014-11-22 DIAGNOSIS — E785 Hyperlipidemia, unspecified: Secondary | ICD-10-CM | POA: Diagnosis present

## 2014-11-22 DIAGNOSIS — E780 Pure hypercholesterolemia: Secondary | ICD-10-CM | POA: Diagnosis present

## 2014-11-22 DIAGNOSIS — F419 Anxiety disorder, unspecified: Secondary | ICD-10-CM | POA: Diagnosis present

## 2014-11-22 DIAGNOSIS — I480 Paroxysmal atrial fibrillation: Secondary | ICD-10-CM

## 2014-11-22 DIAGNOSIS — J40 Bronchitis, not specified as acute or chronic: Secondary | ICD-10-CM | POA: Diagnosis present

## 2014-11-22 DIAGNOSIS — E119 Type 2 diabetes mellitus without complications: Secondary | ICD-10-CM | POA: Diagnosis present

## 2014-11-22 DIAGNOSIS — Z90710 Acquired absence of both cervix and uterus: Secondary | ICD-10-CM | POA: Diagnosis not present

## 2014-11-22 LAB — CBC
HEMATOCRIT: 35 % — AB (ref 36.0–46.0)
Hemoglobin: 11.7 g/dL — ABNORMAL LOW (ref 12.0–15.0)
MCH: 32.1 pg (ref 26.0–34.0)
MCHC: 33.4 g/dL (ref 30.0–36.0)
MCV: 95.9 fL (ref 78.0–100.0)
PLATELETS: 231 10*3/uL (ref 150–400)
RBC: 3.65 MIL/uL — ABNORMAL LOW (ref 3.87–5.11)
RDW: 12.7 % (ref 11.5–15.5)
WBC: 5.6 10*3/uL (ref 4.0–10.5)

## 2014-11-22 LAB — BASIC METABOLIC PANEL
ANION GAP: 14 (ref 5–15)
BUN: 10 mg/dL (ref 6–23)
CHLORIDE: 107 meq/L (ref 96–112)
CO2: 23 mEq/L (ref 19–32)
Calcium: 9.2 mg/dL (ref 8.4–10.5)
Creatinine, Ser: 0.66 mg/dL (ref 0.50–1.10)
GFR, EST NON AFRICAN AMERICAN: 86 mL/min — AB (ref >90)
Glucose, Bld: 71 mg/dL (ref 70–99)
POTASSIUM: 3.4 meq/L — AB (ref 3.7–5.3)
SODIUM: 144 meq/L (ref 137–147)

## 2014-11-22 LAB — I-STAT TROPONIN, ED: TROPONIN I, POC: 0 ng/mL (ref 0.00–0.08)

## 2014-11-22 LAB — PROTIME-INR
INR: 0.95 (ref 0.00–1.49)
Prothrombin Time: 12.7 seconds (ref 11.6–15.2)

## 2014-11-22 LAB — GLUCOSE, CAPILLARY: Glucose-Capillary: 110 mg/dL — ABNORMAL HIGH (ref 70–99)

## 2014-11-22 LAB — TROPONIN I: Troponin I: <0.3 ng/mL (ref <0.30)

## 2014-11-22 LAB — T4, FREE: Free T4: 0.87 ng/dL (ref 0.80–1.80)

## 2014-11-22 LAB — PRO B NATRIURETIC PEPTIDE: PRO B NATRI PEPTIDE: 123.1 pg/mL (ref 0–125)

## 2014-11-22 LAB — TSH: TSH: 1.38 u[IU]/mL (ref 0.350–4.500)

## 2014-11-22 MED ORDER — NITROGLYCERIN 2 % TD OINT: 1.0000 [in_us] | TOPICAL_OINTMENT | Freq: Four times a day (QID) | TRANSDERMAL | Status: DC

## 2014-11-22 MED ORDER — ASPIRIN 81 MG PO CHEW
324.0000 mg | CHEWABLE_TABLET | ORAL | Status: AC
  Administered 2014-11-22: 324 mg via ORAL
  Filled 2014-11-22: qty 4

## 2014-11-22 MED ORDER — INSULIN ASPART 100 UNIT/ML ~~LOC~~ SOLN
0.0000 [IU] | Freq: Three times a day (TID) | SUBCUTANEOUS | Status: DC
  Administered 2014-11-23: 1 [IU] via SUBCUTANEOUS

## 2014-11-22 MED ORDER — POTASSIUM CHLORIDE CRYS ER 20 MEQ PO TBCR: 40.0000 meq | EXTENDED_RELEASE_TABLET | Freq: Once | ORAL | Status: DC

## 2014-11-22 MED ORDER — ASPIRIN EC 81 MG PO TBEC
81.0000 mg | DELAYED_RELEASE_TABLET | Freq: Every day | ORAL | Status: DC
  Administered 2014-11-23 – 2014-11-24 (×2): 81 mg via ORAL
  Filled 2014-11-22 (×2): qty 1

## 2014-11-22 MED ORDER — ONDANSETRON HCL 4 MG/2ML IJ SOLN: 4.0000 mg | Freq: Four times a day (QID) | INTRAMUSCULAR | Status: DC | PRN

## 2014-11-22 MED ORDER — POTASSIUM CHLORIDE CRYS ER 20 MEQ PO TBCR
40.0000 meq | EXTENDED_RELEASE_TABLET | Freq: Once | ORAL | Status: AC
  Administered 2014-11-22: 40 meq via ORAL
  Filled 2014-11-22: qty 2

## 2014-11-22 MED ORDER — NITROGLYCERIN IN D5W 200-5 MCG/ML-% IV SOLN
10.0000 ug/min | Freq: Once | INTRAVENOUS | Status: AC
  Administered 2014-11-22: 10 ug/min via INTRAVENOUS
  Filled 2014-11-22: qty 250

## 2014-11-22 MED ORDER — LORAZEPAM 1 MG PO TABS
1.0000 mg | ORAL_TABLET | Freq: Every day | ORAL | Status: DC
  Administered 2014-11-23 (×2): 1 mg via ORAL
  Filled 2014-11-22 (×3): qty 1

## 2014-11-22 MED ORDER — NITROGLYCERIN 0.4 MG SL SUBL
0.4000 mg | SUBLINGUAL_TABLET | SUBLINGUAL | Status: DC | PRN
  Administered 2014-11-22 (×3): 0.4 mg via SUBLINGUAL
  Filled 2014-11-22: qty 1

## 2014-11-22 MED ORDER — SODIUM CHLORIDE 0.9 % IV SOLN: INTRAVENOUS | Status: DC

## 2014-11-22 MED ORDER — PANTOPRAZOLE SODIUM 40 MG PO TBEC
40.0000 mg | DELAYED_RELEASE_TABLET | Freq: Every day | ORAL | Status: DC
  Administered 2014-11-23 – 2014-11-24 (×2): 40 mg via ORAL
  Filled 2014-11-22 (×2): qty 1

## 2014-11-22 MED ORDER — HEPARIN BOLUS VIA INFUSION
3000.0000 [IU] | Freq: Once | INTRAVENOUS | Status: AC
  Administered 2014-11-22: 3000 [IU] via INTRAVENOUS
  Filled 2014-11-22: qty 3000

## 2014-11-22 MED ORDER — AMIODARONE HCL IN DEXTROSE 360-4.14 MG/200ML-% IV SOLN
60.0000 mg/h | INTRAVENOUS | Status: AC
  Administered 2014-11-22: 60 mg/h via INTRAVENOUS
  Filled 2014-11-22: qty 200

## 2014-11-22 MED ORDER — ASPIRIN 300 MG RE SUPP
300.0000 mg | RECTAL | Status: AC
Start: 2014-11-22 — End: 2014-11-22

## 2014-11-22 MED ORDER — ACETAMINOPHEN 325 MG PO TABS
650.0000 mg | ORAL_TABLET | ORAL | Status: DC | PRN
Start: 2014-11-22 — End: 2014-11-24

## 2014-11-22 MED ORDER — AMIODARONE HCL IN DEXTROSE 360-4.14 MG/200ML-% IV SOLN
30.0000 mg/h | INTRAVENOUS | Status: DC
  Administered 2014-11-23: 30 mg/h via INTRAVENOUS
  Filled 2014-11-22: qty 200

## 2014-11-22 MED ORDER — INFLUENZA VAC SPLIT QUAD 0.5 ML IM SUSY
0.5000 mL | PREFILLED_SYRINGE | INTRAMUSCULAR | Status: AC
  Administered 2014-11-23: 0.5 mL via INTRAMUSCULAR
  Filled 2014-11-22: qty 0.5

## 2014-11-22 MED ORDER — CHOLESTYRAMINE 4 GM/DOSE PO POWD: 4.0000 g | Freq: Two times a day (BID) | ORAL | Status: DC

## 2014-11-22 MED ORDER — AMOXICILLIN-POT CLAVULANATE 875-125 MG PO TABS
1.0000 | ORAL_TABLET | Freq: Two times a day (BID) | ORAL | Status: DC
  Administered 2014-11-22 – 2014-11-24 (×4): 1 via ORAL
  Filled 2014-11-22 (×4): qty 1

## 2014-11-22 MED ORDER — CHOLESTYRAMINE 4 G PO PACK
4.0000 g | PACK | Freq: Two times a day (BID) | ORAL | Status: DC
  Administered 2014-11-23 – 2014-11-24 (×3): 4 g via ORAL
  Filled 2014-11-22 (×5): qty 1

## 2014-11-22 MED ORDER — GLIMEPIRIDE 1 MG PO TABS
2.0000 mg | ORAL_TABLET | Freq: Every day | ORAL | Status: DC
  Administered 2014-11-23 – 2014-11-24 (×2): 2 mg via ORAL
  Filled 2014-11-22 (×2): qty 2

## 2014-11-22 MED ORDER — ATORVASTATIN CALCIUM 20 MG PO TABS
20.0000 mg | ORAL_TABLET | Freq: Every day | ORAL | Status: DC
  Administered 2014-11-22 – 2014-11-24 (×3): 20 mg via ORAL
  Filled 2014-11-22 (×3): qty 1

## 2014-11-22 MED ORDER — AMIODARONE LOAD VIA INFUSION
150.0000 mg | Freq: Once | INTRAVENOUS | Status: AC
  Administered 2014-11-22: 150 mg via INTRAVENOUS
  Filled 2014-11-22: qty 83.34

## 2014-11-22 MED ORDER — HEPARIN (PORCINE) IN NACL 100-0.45 UNIT/ML-% IJ SOLN
1000.0000 [IU]/h | INTRAMUSCULAR | Status: DC
  Administered 2014-11-22: 900 [IU]/h via INTRAVENOUS
  Administered 2014-11-23: 1000 [IU]/h via INTRAVENOUS
  Filled 2014-11-22 (×2): qty 250

## 2014-11-22 MED ORDER — METOPROLOL TARTRATE 12.5 MG HALF TABLET
12.5000 mg | ORAL_TABLET | Freq: Two times a day (BID) | ORAL | Status: DC
  Administered 2014-11-22 – 2014-11-24 (×4): 12.5 mg via ORAL
  Filled 2014-11-22 (×4): qty 1

## 2014-11-22 NOTE — ED Notes (Signed)
Spoke with [facility] Unable to take patient as a Tele patient due to chest pain. Paged Admitting MD.

## 2014-11-22 NOTE — ED Notes (Signed)
Attempted Report x1.   

## 2014-11-22 NOTE — ED Notes (Signed)
Paged Admitting MD. Ardelia MemsUnable to put patient upstairs without step-down order.

## 2014-11-22 NOTE — ED Provider Notes (Signed)
CSN: [id number]     Arrival date & time 11/22/14  1428 History   First MD Initiated Contact with Patient 11/22/14 1725     Chief Complaint  Patient presents with  . Chest Pain  . Shortness of Breath     (Consider location/radiation/quality/duration/timing/severity/associated sxs/prior Treatment) Patient is a 73 y.o. female presenting with chest pain and shortness of breath. The history is provided by the patient.  Chest Pain Associated symptoms: shortness of breath   Shortness of Breath Associated symptoms: chest pain   She has been having episodes of chest heaviness and dyspnea which started yesterday. This somewhat worse when she lays flat but not on a consistent basis. There is no associated nausea, vomiting, diaphoresis. She has had a nonproductive cough. She denies fever chills or sweats. She has not taken anything to help her symptoms. She has noted some intermittent palpitations.  Past Medical History  Diagnosis Date  . Hypertension   . Diabetes mellitus   . Physical exam, annual 10/22/06  . Anxiety   . Hyperlipidemia    Past Surgical History  Procedure Laterality Date  . Abdominal hysterectomy     No family history on file. History  Substance Use Topics  . Smoking status: Never Smoker   . Smokeless tobacco: Not on file  . Alcohol Use: No   OB History    No data available     Review of Systems  Respiratory: Positive for shortness of breath.   Cardiovascular: Positive for chest pain.  All other systems reviewed and are negative.     Allergies  Review of patient's allergies indicates no known allergies.  Home Medications   Prior to Admission medications   Medication Sig Start Date End Date Taking? Authorizing Provider  acetaminophen (TYLENOL) 500 MG tablet Take 500 mg by mouth every 6 (six) hours as needed for pain.    Historical Provider, MD  ALPRAZolam Prudy Feeler(XANAX) 0.5 MG tablet Take 0.5 mg by mouth at bedtime as needed. For anxiety    Historical Provider,  MD  aspirin 325 MG tablet Take 325 mg by mouth daily.    Historical Provider, MD  atorvastatin (LIPITOR) 20 MG tablet Take 20 mg by mouth daily.  03/26/12   Historical Provider, MD  cholestyramine Lanetta Inch(QUESTRAN) 4 GM/DOSE powder Take 4 g by mouth 2 (two) times daily.  05/28/12   Historical Provider, MD  COLCRYS 0.6 MG tablet Take 0.6 mg by mouth daily. 11/02/13   Historical Provider, MD  ENABLEX 15 MG 24 hr tablet Take 15 mg by mouth daily.  05/30/12   Historical Provider, MD  estradiol (CLIMARA - DOSED IN MG/24 HR) 0.025 mg/24hr Place 1 patch onto the skin once a week.    Historical Provider, MD  glimepiride (AMARYL) 4 MG tablet Take 4 mg by mouth daily before breakfast.    Historical Provider, MD  HYDROcodone-acetaminophen (NORCO) 5-325 MG per tablet Take 1 tablet by mouth every 6 (six) hours as needed for moderate pain. 10/27/14   Charm RingsErin J Honig, MD  LORazepam (ATIVAN) 1 MG tablet Take 1 mg by mouth daily.  05/27/12   Historical Provider, MD  meloxicam (MOBIC) 7.5 MG tablet Take 1 tablet (7.5 mg total) by mouth daily. 10/27/14   Charm RingsErin J Honig, MD  NEXIUM 40 MG capsule Take 40 mg by mouth daily. 11/02/13   Historical Provider, MD  nitroGLYCERIN (NITRODUR - DOSED IN MG/24 HR) 0.2 mg/hr Place 0.25 patches onto the skin daily. Place 1/4th of a patch over affected area  of shoulder - change daily 07/01/12   Lenda Kelp, MD  Probiotic Product (ALIGN) 4 MG CAPS Take by mouth daily.    Historical Provider, MD  traMADol (ULTRAM) 50 MG tablet Take 1 tablet (50 mg total) by mouth every 6 (six) hours as needed. 11/10/13   Gilda Crease, MD   BP 150/86 mmHg  Pulse 65  Temp(Src) 98.1 F (36.7 C) (Oral)  Resp 22  Ht 5' 4.5" (1.638 m)  Wt 140 lb (63.504 kg)  BMI 23.67 kg/m2  SpO2 100% Physical Exam  Nursing note and vitals reviewed.  73 year old female, resting comfortably and in no acute distress. Vital signs are significant for hypertension and mild tachypnea. Oxygen saturation is 100%, which is  normal. Head is normocephalic and atraumatic. PERRLA, EOMI. Oropharynx is clear. Neck is nontender and supple without adenopathy or JVD. Back is nontender and there is no CVA tenderness. Lungs are clear without rales, wheezes, or rhonchi. Chest is nontender. Heart has an irregular rhythm without murmur. Abdomen is soft, flat, nontender without masses or hepatosplenomegaly and peristalsis is normoactive. Extremities have no cyanosis or edema, full range of motion is present. Skin is warm and dry without rash. Neurologic: Mental status is normal, cranial nerves are intact, there are no motor or sensory deficits.  ED Course  Procedures (including critical care time) Labs Review Results for orders placed or performed during the hospital encounter of 11/22/14  CBC  Result Value Ref Range   WBC 5.6 4.0 - 10.5 K/uL   RBC 3.65 (L) 3.87 - 5.11 MIL/uL   Hemoglobin 11.7 (L) 12.0 - 15.0 g/dL   HCT 16.1 (L) 09.6 - 04.5 %   MCV 95.9 78.0 - 100.0 fL   MCH 32.1 26.0 - 34.0 pg   MCHC 33.4 30.0 - 36.0 g/dL   RDW 40.9 81.1 - 91.4 %   Platelets 231 150 - 400 K/uL  Basic metabolic panel  Result Value Ref Range   Sodium 144 137 - 147 mEq/L   Potassium 3.4 (L) 3.7 - 5.3 mEq/L   Chloride 107 96 - 112 mEq/L   CO2 23 19 - 32 mEq/L   Glucose, Bld 71 70 - 99 mg/dL   BUN 10 6 - 23 mg/dL   Creatinine, Ser 7.82 0.50 - 1.10 mg/dL   Calcium 9.2 8.4 - 95.6 mg/dL   GFR calc non Af Amer 86 (L) >90 mL/min   GFR calc Af Amer >90 >90 mL/min   Anion gap 14 5 - 15  BNP (order ONLY if patient complains of dyspnea/SOB AND you have documented it for THIS visit)  Result Value Ref Range   Pro B Natriuretic peptide (BNP) 123.1 0 - 125 pg/mL  I-stat troponin, ED (not at [facility])  Result Value Ref Range   Troponin i, poc 0.00 0.00 - 0.08 ng/mL   Comment 3           Imaging Review Dg Chest 2 View  11/22/2014   CLINICAL DATA:  Cough and chest congestion.  Mid chest pain.  EXAM: CHEST  2 VIEW  COMPARISON:  11/04/2013   FINDINGS: There is peribronchial thickening. Heart size and vascularity are normal. No infiltrates or effusions. No osseous abnormality.  IMPRESSION: Bronchitic changes.   Electronically Signed   By: Geanie Cooley M.D.   On: 11/22/2014 16:02     EKG Interpretation   Date/Time:  Monday November 22 2014 14:36:18 EST Ventricular Rate:  67 PR Interval:  174 QRS Duration:  70 QT Interval:  372 QTC Calculation: 393 R Axis:   13 Text Interpretation:  Normal sinus rhythm Nonspecific T wave abnormality  Abnormal ECG When compared with ECG of 02/15/2012, No significant change was  found Confirmed by [facility]  MD, Tarvis Blossom ([id number]) on 11/22/2014 5:19:38 PM        EKG Interpretation   Date/Time:  Monday November 22 2014 17:43:00 EST Ventricular Rate:  103 PR Interval:  174 QRS Duration: 77 QT Interval:  397 QTC Calculation: 520 R Axis:   32 Text Interpretation:  Age not entered, assumed to be  73 years old for  purpose of ECG interpretation Atrial fibrillation Ventricular premature  complex Borderline repolarization abnormality Prolonged QT interval When  compared with ECG of 11/22/2014, Atrial fibrillation has replaced Sinus  rhythm Confirmed by Preston FleetingGLICK  MD, Cleotilde Spadaccini ([id number]) on 11/22/2014 5:50:23 PM       MDM   Final diagnoses:  Paroxysmal atrial fibrillation  Dyspnea    Chest discomfort with dyspnea. Patient came in in sinus rhythm and converted to atrial fibrillation. I suspect that she has been in and out of atrial fibrillation during the last several days. She currently was unaware of an irregular heart rate so I did not feel that she can be relied on to determine when she started in atrial fibrillation. This is a new problem for so she will be admitted for further evaluation. Case has been discussed with Dr. Sharyn LullHarwani who agrees to admit the patient. She started on heparin for anticoagulation. Because of uncertainty of when she had her first episode of atrial fibrillation, she is not a candidate  for flecainide therapy.    Dione Boozeavid Akbar Sacra, MD 11/22/14 [phone]

## 2014-11-22 NOTE — ED Notes (Signed)
Pt. Reports chest pressure starting last night with SOB. Also reports productive cough that started yesterday morning. Pt. Reports hx of bronchitis and states this feels similar.  Alert and oriented in triage, no distress noted.

## 2014-11-22 NOTE — ED Notes (Signed)
Cardiologist at the bedside

## 2014-11-22 NOTE — ED Notes (Signed)
Pt placed into gown and on monitor upon arrival to room. Pt monitored by blood pressure, pulse ox, and 5 lead. pts tech at bedside made aware pt was placed into room.

## 2014-11-22 NOTE — ED Notes (Signed)
Patient's HR sliding between 144-88 bpm due to irregular heartbeat.

## 2014-11-22 NOTE — Progress Notes (Signed)
ANTICOAGULATION CONSULT NOTE - Initial Consult  Pharmacy Consult for heparin Indication: atrial fibrillation  No Known Allergies  Patient Measurements: Height: 5' 4.5" (163.8 cm) Weight: 140 lb (63.504 kg) IBW/kg (Calculated) : 55.85 Heparin Dosing Weight: 63.5 kg  Vital Signs: Temp: 98.1 F (36.7 C) (12/07 1804) Temp Source: Oral (12/07 1440) BP: 148/82 mmHg (12/07 1730) Pulse Rate: 82 (12/07 1730)  Labs:  Recent Labs  11/22/14 1445  HGB 11.7*  HCT 35.0*  PLT 231  CREATININE 0.66    Estimated Creatinine Clearance: 56.1 mL/min (by C-G formula based on Cr of 0.66).   Medical History: Past Medical History  Diagnosis Date  . Hypertension   . Diabetes mellitus   . Physical exam, annual 10/22/06  . Anxiety   . Hyperlipidemia     Medications:   (Not in a hospital admission)  Assessment: 73 yo female with chest pressure, palpitations and SOB. Found to have new onset AFib with stable HR. Pt noted to be hypertensive, hgb 11.7, plts wnl, K 3.4 - this was repleted. No anticoagulants prior to admissionn.  Goal of Therapy:  Heparin level 0.3-0.7 units/ml Monitor platelets by anticoagulation protocol: Yes   Plan:  Heparin bolus 3000 units x1, followed by 900 units/hr Daily HL, CBC Check level in 8 hours Monitor for s/sx bleeding  Agapito GamesAlison Damond Borchers, PharmD, BCPS Clinical Pharmacist Pager: [phone] 11/22/2014 6:18 PM

## 2014-11-22 NOTE — ED Notes (Signed)
Patient converted back to NSR.

## 2014-11-22 NOTE — H&P (Signed)
Jacqueline Ball is an 73 y.o. female.   Chief Complaint: Chest pressure associated with palpitations and shortness of breath HPI: Patient is 73 year old female with past medical history significant for hypertension, non-insulin-dependent diabetes mellitus, hypercholesteremia, anxiety disorder, came to the ER complaining of retrosternal chest pressure associated with shortness of breath and palpitation off and on since last night also complains of cough with chest congestion. Denies any fever. Denies sore throat. Patient initially was in normal sinus rhythm and subsequently was noted on monitor to have paroxysmal A. fib with controlled ventricular response. Patient denies any recent weight loss denies any thyroid problems in the past. Denies history of rheumatic fever in the past.  Past Medical History  Diagnosis Date  . Hypertension   . Diabetes mellitus   . Physical exam, annual 10/22/06  . Anxiety   . Hyperlipidemia     Past Surgical History  Procedure Laterality Date  . Abdominal hysterectomy      No family history on file. Social History:  reports that she has never smoked. She does not have any smokeless tobacco history on file. She reports that she does not drink alcohol or use illicit drugs.  Allergies: No Known Allergies   (Not in a hospital admission)  Results for orders placed or performed during the hospital encounter of 11/22/14 (from the past 48 hour(s))  CBC     Status: Abnormal   Collection Time: 11/22/14  2:45 PM  Result Value Ref Range   WBC 5.6 4.0 - 10.5 K/uL   RBC 3.65 (L) 3.87 - 5.11 MIL/uL   Hemoglobin 11.7 (L) 12.0 - 15.0 g/dL   HCT 35.0 (L) 36.0 - 46.0 %   MCV 95.9 78.0 - 100.0 fL   MCH 32.1 26.0 - 34.0 pg   MCHC 33.4 30.0 - 36.0 g/dL   RDW 12.7 11.5 - 15.5 %   Platelets 231 150 - 400 K/uL  Basic metabolic panel     Status: Abnormal   Collection Time: 11/22/14  2:45 PM  Result Value Ref Range   Sodium 144 137 - 147 mEq/L   Potassium 3.4 (L) 3.7 - 5.3  mEq/L   Chloride 107 96 - 112 mEq/L   CO2 23 19 - 32 mEq/L   Glucose, Bld 71 70 - 99 mg/dL   BUN 10 6 - 23 mg/dL   Creatinine, Ser 0.66 0.50 - 1.10 mg/dL   Calcium 9.2 8.4 - 10.5 mg/dL   GFR calc non Af Amer 86 (L) >90 mL/min   GFR calc Af Amer >90 >90 mL/min    Comment: (NOTE) The eGFR has been calculated using the CKD EPI equation. This calculation has not been validated in all clinical situations. eGFR's persistently <90 mL/min signify possible Chronic Kidney Disease.    Anion gap 14 5 - 15  BNP (order ONLY if patient complains of dyspnea/SOB AND you have documented it for THIS visit)     Status: None   Collection Time: 11/22/14  2:45 PM  Result Value Ref Range   Pro B Natriuretic peptide (BNP) 123.1 0 - 125 pg/mL  I-stat troponin, ED (not at [facility])     Status: None   Collection Time: 11/22/14  3:06 PM  Result Value Ref Range   Troponin i, poc 0.00 0.00 - 0.08 ng/mL   Comment 3            Comment: Due to the release kinetics of cTnI, a negative result within the first hours of the onset of symptoms  does not rule out myocardial infarction with certainty. If myocardial infarction is still suspected, repeat the test at appropriate intervals.    Dg Chest 2 View  11/22/2014   CLINICAL DATA:  Cough and chest congestion.  Mid chest pain.  EXAM: CHEST  2 VIEW  COMPARISON:  11/04/2013  FINDINGS: There is peribronchial thickening. Heart size and vascularity are normal. No infiltrates or effusions. No osseous abnormality.  IMPRESSION: Bronchitic changes.   Electronically Signed   By: Rozetta Nunnery M.D.   On: 11/22/2014 16:02    Review of Systems  Constitutional: Positive for malaise/fatigue. Negative for fever and chills.  HENT: Negative for hearing loss.   Eyes: Negative for double vision and photophobia.  Respiratory: Positive for cough and shortness of breath. Negative for wheezing.   Cardiovascular: Positive for chest pain and palpitations. Negative for claudication and leg  swelling.  Gastrointestinal: Negative for nausea, vomiting, abdominal pain and diarrhea.  Genitourinary: Negative for dysuria.  Neurological: Negative for dizziness and headaches.    Blood pressure 180/100, pulse 32, temperature 98.1 F (36.7 C), temperature source Oral, resp. rate 25, height 5' 4.5" (1.638 m), weight 63.504 kg (140 lb), SpO2 96 %. Physical Exam  Constitutional: She is oriented to person, place, and time.  HENT:  Head: Normocephalic and atraumatic.  Eyes: Conjunctivae are normal. Left eye exhibits no discharge. No scleral icterus.  Neck: Normal range of motion. Neck supple. No JVD present. No tracheal deviation present. No thyromegaly present.  Cardiovascular:  Irregularly irregular S1 and S2 soft there is soft systolic murmur no S3 gallop  Respiratory: Effort normal and breath sounds normal. No respiratory distress. She has no wheezes. She has no rales.  GI: Soft. Bowel sounds are normal. She exhibits no distension. There is no tenderness. There is no rebound.  Musculoskeletal: She exhibits no edema or tenderness.  Neurological: She is alert and oriented to person, place, and time.     Assessment/Plan Chest pain rule out MI New-onset paroxysmal A. fib/flutter with controlled ventricular response Uncontrolled hypertension Non-insulin-dependent diabetes mellitus Hypercholesteremia Anxiety disorder  Bronchitis Plan As per orders Natale Barba N 11/22/2014, 6:38 PM

## 2014-11-23 ENCOUNTER — Inpatient Hospital Stay (HOSPITAL_COMMUNITY): Payer: Medicare HMO

## 2014-11-23 DIAGNOSIS — I480 Paroxysmal atrial fibrillation: Secondary | ICD-10-CM | POA: Diagnosis not present

## 2014-11-23 LAB — CBC
HEMATOCRIT: 35.1 % — AB (ref 36.0–46.0)
Hemoglobin: 11.8 g/dL — ABNORMAL LOW (ref 12.0–15.0)
MCH: 33.1 pg (ref 26.0–34.0)
MCHC: 33.6 g/dL (ref 30.0–36.0)
MCV: 98.6 fL (ref 78.0–100.0)
Platelets: 238 10*3/uL (ref 150–400)
RBC: 3.56 MIL/uL — AB (ref 3.87–5.11)
RDW: 12.7 % (ref 11.5–15.5)
WBC: 5.6 10*3/uL (ref 4.0–10.5)

## 2014-11-23 LAB — BASIC METABOLIC PANEL
Anion gap: 13 (ref 5–15)
BUN: 11 mg/dL (ref 6–23)
CALCIUM: 8.9 mg/dL (ref 8.4–10.5)
CHLORIDE: 108 meq/L (ref 96–112)
CO2: 19 mEq/L (ref 19–32)
CREATININE: 0.69 mg/dL (ref 0.50–1.10)
GFR calc Af Amer: >90 mL/min (ref >90)
GFR calc non Af Amer: 85 mL/min — ABNORMAL LOW (ref >90)
GLUCOSE: 154 mg/dL — AB (ref 70–99)
Potassium: 3.9 mEq/L (ref 3.7–5.3)
Sodium: 140 mEq/L (ref 137–147)

## 2014-11-23 LAB — COMPREHENSIVE METABOLIC PANEL
ALT: 30 U/L (ref 0–35)
ANION GAP: 12 (ref 5–15)
AST: 22 U/L (ref 0–37)
Albumin: 3.6 g/dL (ref 3.5–5.2)
Alkaline Phosphatase: 80 U/L (ref 39–117)
BUN: 9 mg/dL (ref 6–23)
CALCIUM: 9 mg/dL (ref 8.4–10.5)
CO2: 25 mEq/L (ref 19–32)
Chloride: 107 mEq/L (ref 96–112)
Creatinine, Ser: 0.73 mg/dL (ref 0.50–1.10)
GFR calc non Af Amer: 83 mL/min — ABNORMAL LOW (ref >90)
GLUCOSE: 135 mg/dL — AB (ref 70–99)
POTASSIUM: 3.3 meq/L — AB (ref 3.7–5.3)
SODIUM: 144 meq/L (ref 137–147)
Total Bilirubin: 0.3 mg/dL (ref 0.3–1.2)
Total Protein: 6.9 g/dL (ref 6.0–8.3)

## 2014-11-23 LAB — TROPONIN I: Troponin I: <0.3 ng/mL (ref <0.30)

## 2014-11-23 LAB — GLUCOSE, CAPILLARY
Glucose-Capillary: 136 mg/dL — ABNORMAL HIGH (ref 70–99)
Glucose-Capillary: 115 mg/dL — ABNORMAL HIGH (ref 70–99)
Glucose-Capillary: 133 mg/dL — ABNORMAL HIGH (ref 70–99)
Glucose-Capillary: 97 mg/dL (ref 70–99)
Glucose-Capillary: 142 mg/dL — ABNORMAL HIGH (ref 70–99)

## 2014-11-23 LAB — LIPID PANEL
Cholesterol: 113 mg/dL (ref 0–200)
HDL: 44 mg/dL (ref >39)
LDL Cholesterol: 53 mg/dL (ref 0–99)
Total CHOL/HDL Ratio: 2.6 RATIO
Triglycerides: 78 mg/dL (ref <150)
VLDL: 16 mg/dL (ref 0–40)

## 2014-11-23 LAB — HEPARIN LEVEL (UNFRACTIONATED)
Heparin Unfractionated: 0.24 IU/mL — ABNORMAL LOW (ref 0.30–0.70)
Heparin Unfractionated: 0.35 IU/mL (ref 0.30–0.70)

## 2014-11-23 LAB — HEMOGLOBIN A1C
Hgb A1c MFr Bld: 5.7 % — ABNORMAL HIGH (ref <5.7)
Mean Plasma Glucose: 117 mg/dL — ABNORMAL HIGH (ref <117)

## 2014-11-23 LAB — MRSA PCR SCREENING: MRSA by PCR: NEGATIVE

## 2014-11-23 LAB — MAGNESIUM: MAGNESIUM: 1.8 mg/dL (ref 1.5–2.5)

## 2014-11-23 LAB — TSH: TSH: 2.88 u[IU]/mL (ref 0.350–4.500)

## 2014-11-23 MED ORDER — TECHNETIUM TC 99M SESTAMIBI GENERIC - CARDIOLITE
10.0000 | Freq: Once | INTRAVENOUS | Status: AC | PRN
  Administered 2014-11-23: 10 via INTRAVENOUS

## 2014-11-23 MED ORDER — ALUM & MAG HYDROXIDE-SIMETH 200-200-20 MG/5ML PO SUSP
15.0000 mL | ORAL | Status: DC | PRN
  Administered 2014-11-23: 15 mL via ORAL
  Filled 2014-11-23: qty 30

## 2014-11-23 MED ORDER — REGADENOSON 0.4 MG/5ML IV SOLN
INTRAVENOUS | Status: AC
  Filled 2014-11-23: qty 5

## 2014-11-23 MED ORDER — OFF THE BEAT BOOK
Freq: Once | Status: AC
  Administered 2014-11-23: 12:00:00
  Filled 2014-11-23: qty 1

## 2014-11-23 MED ORDER — AMIODARONE HCL 200 MG PO TABS
200.0000 mg | ORAL_TABLET | Freq: Two times a day (BID) | ORAL | Status: DC
  Administered 2014-11-23 – 2014-11-24 (×2): 200 mg via ORAL
  Filled 2014-11-23 (×2): qty 1

## 2014-11-23 MED ORDER — NITROGLYCERIN 2 % TD OINT
0.5000 [in_us] | TOPICAL_OINTMENT | Freq: Three times a day (TID) | TRANSDERMAL | Status: DC
  Administered 2014-11-23: 0.5 [in_us] via TOPICAL
  Filled 2014-11-23: qty 30

## 2014-11-23 MED ORDER — APIXABAN 5 MG PO TABS
5.0000 mg | ORAL_TABLET | Freq: Two times a day (BID) | ORAL | Status: DC
  Administered 2014-11-23 – 2014-11-24 (×2): 5 mg via ORAL
  Filled 2014-11-23 (×2): qty 1

## 2014-11-23 MED ORDER — TECHNETIUM TC 99M SESTAMIBI GENERIC - CARDIOLITE
30.0000 | Freq: Once | INTRAVENOUS | Status: AC | PRN
  Administered 2014-11-23: 30 via INTRAVENOUS

## 2014-11-23 MED ORDER — POTASSIUM CHLORIDE CRYS ER 20 MEQ PO TBCR
40.0000 meq | EXTENDED_RELEASE_TABLET | Freq: Once | ORAL | Status: AC
  Administered 2014-11-23: 40 meq via ORAL
  Filled 2014-11-23: qty 4

## 2014-11-23 MED ORDER — REGADENOSON 0.4 MG/5ML IV SOLN
0.4000 mg | Freq: Once | INTRAVENOUS | Status: AC
  Administered 2014-11-23: 0.4 mg via INTRAVENOUS
  Filled 2014-11-23: qty 5

## 2014-11-23 NOTE — Care Management Note (Unsigned)
    Page 1 of 1   11/23/2014     9:39:07 AM CARE MANAGEMENT NOTE 11/23/2014  Patient:  Jacqueline Ball,Jacqueline Ball   Account Number:  [id number]  Date Initiated:  11/23/2014  Documentation initiated by:  GRAVES-BIGELOW,Merilee Wible  Subjective/Objective Assessment:   Pt admitted for New-onset paroxysmal A. fib/flutter RVR. Initiated on IV amio gtt.     Action/Plan:   CM will continue to monitor for disposition needs.   Anticipated DC Date:  11/25/2014   Anticipated DC Plan:  HOME/SELF CARE      DC Planning Services  CM consult      Choice offered to / List presented to:             Status of service:  In process, will continue to follow Medicare Important Message given?   (If response is "NO", the following Medicare IM given date fields will be blank) Date Medicare IM given:   Medicare IM given by:   Date Additional Medicare IM given:   Additional Medicare IM given by:    Discharge Disposition:    Per UR Regulation:  Reviewed for med. necessity/level of care/duration of stay  If discussed at Long Length of Stay Meetings, dates discussed:    Comments:

## 2014-11-23 NOTE — Progress Notes (Signed)
ANTICOAGULATION CONSULT NOTE - Follow Up Consult  Pharmacy Consult for heparin Indication: atrial fibrillation  Labs:  Recent Labs  11/22/14 1445 11/22/14 1819 11/22/14 2325 11/23/14 0230  HGB 11.7*  --   --   --   HCT 35.0*  --   --   --   PLT 231  --   --   --   LABPROT  --  12.7  --   --   INR  --  0.95  --   --   HEPARINUNFRC  --   --   --  0.24*  CREATININE 0.66  --  0.73  --   TROPONINI  --   --  <0.30  --     Assessment: 73yo female subtherapeutic on heparin with initial dosing for Afib.  Goal of Therapy:  Heparin level 0.3-0.7 units/ml   Plan:  Will increase heparin gtt by 1-2 units/kg/hr to 1000 units/hr and check level in 8hr.  Jacqueline Ball, PharmD, BCPS  11/23/2014,5:55 AM

## 2014-11-23 NOTE — Progress Notes (Signed)
Subjective:  Patient denies any chest pain or shortness of breath converted back to sinus rhythm cardiac enzymes so far has been negative. Scheduled for nuclear stress test today  Objective:  Vital Signs in the last 24 hours: Temp:  [97.5 F (36.4 C)-98.2 F (36.8 C)] 97.5 F (36.4 C) (12/08 0340) Pulse Rate:  [32-109] 61 (12/08 0605) Resp:  [16-36] 22 (12/08 0605) BP: (101-180)/(58-103) 101/58 mmHg (12/08 0605) SpO2:  [95 %-100 %] 99 % (12/08 0605) Weight:  [63.504 kg (140 lb)-64.864 kg (143 lb)] 64.864 kg (143 lb) (12/08 0340)  Intake/Output from previous day: 12/07 0701 - 12/08 0700 In: 240 [P.O.:240] Out: -  Intake/Output from this shift:    Physical Exam: Neck: no adenopathy, no carotid bruit, no JVD and supple, symmetrical, trachea midline Lungs: clear to auscultation bilaterally Heart: regular rate and rhythm, S1, S2 normal, no murmur, click, rub or gallop Abdomen: soft, non-tender; bowel sounds normal; no masses,  no organomegaly Extremities: extremities normal, atraumatic, no cyanosis or edema  Lab Results:  Recent Labs  11/22/14 1445 11/23/14 0533  WBC 5.6 5.6  HGB 11.7* 11.8*  PLT 231 238    Recent Labs  11/22/14 2325 11/23/14 0533  NA 144 140  K 3.3* 3.9  CL 107 108  CO2 25 19  GLUCOSE 135* 154*  BUN 9 11  CREATININE 0.73 0.69    Recent Labs  11/22/14 2325 11/23/14 0533  TROPONINI <0.30 <0.30   Hepatic Function Panel  Recent Labs  11/22/14 2325  PROT 6.9  ALBUMIN 3.6  AST 22  ALT 30  ALKPHOS 80  BILITOT 0.3    Recent Labs  11/23/14 0533  CHOL 113   No results for input(s): PROTIME in the last 72 hours.  Imaging: Imaging results have been reviewed and Dg Chest 2 View  11/22/2014   CLINICAL DATA:  Cough and chest congestion.  Mid chest pain.  EXAM: CHEST  2 VIEW  COMPARISON:  11/04/2013  FINDINGS: There is peribronchial thickening. Heart size and vascularity are normal. No infiltrates or effusions. No osseous abnormality.   IMPRESSION: Bronchitic changes.   Electronically Signed   By: Geanie CooleyJim  Maxwell M.D.   On: 11/22/2014 16:02    Cardiac Studies:  Assessment/Plan:  Status post Chest pain MI ruled out Status post New-onset paroxysmal A. fib/flutter with controlled ventricular response converted to sinus rhythm Uncontrolled hypertension Non-insulin-dependent diabetes mellitus Hypercholesteremia Anxiety disorder Resolving Bronchitis Plan Change IV amiodarone to by mouth as per orders Continue heparin for now if nuclear stress test negative will discharge home on NOAC.  LOS: 1 day    Alta Shober N 11/23/2014, 12:38 PM

## 2014-11-23 NOTE — Progress Notes (Signed)
ANTICOAGULATION CONSULT NOTE - Follow Up Consult  Pharmacy Consult for heparin Indication: atrial fibrillation  No Known Allergies  Patient Measurements: Height: 5\' 4"  (162.6 cm) Weight: 143 lb (64.864 kg) IBW/kg (Calculated) : 54.7 Heparin Dosing Weight: 63.5 kg  Vital Signs: Temp: 98.2 F (36.8 C) (12/08 1436) Temp Source: Oral (12/08 1436) BP: 136/73 mmHg (12/08 1436) Pulse Rate: 66 (12/08 1436)  Labs:  Recent Labs  11/22/14 1445 11/22/14 1819 11/22/14 2325 11/23/14 0230 11/23/14 0533 11/23/14 1445  HGB 11.7*  --   --   --  11.8*  --   HCT 35.0*  --   --   --  35.1*  --   PLT 231  --   --   --  238  --   LABPROT  --  12.7  --   --   --   --   INR  --  0.95  --   --   --   --   HEPARINUNFRC  --   --   --  0.24*  --  0.35  CREATININE 0.66  --  0.73  --  0.69  --   TROPONINI  --   --  <0.30  --  <0.30 <0.30    Estimated Creatinine Clearance: 54.9 mL/min (by C-G formula based on Cr of 0.69).   Medications:  Infusions:  . sodium chloride    . heparin 1,000 Units/hr (11/23/14 0606)    Assessment: 73 y/o female on a heparin drip for Afib. Heparin level is therapeutic at 0.35 on 1000 units/hr. No bleeding noted, CBC is stable.  Goal of Therapy:  Heparin level 0.3-0.7 units/ml Monitor platelets by anticoagulation protocol: Yes   Plan:  - Continue heparin drip at 1000 units/hr - Confirmatory heparin level at 22:00 - Daily heparin level and CBC - Monitor for s/sx of bleeding  [facility] Westbrook Medical CtrJennifer Prattville, EspinoPharm.D., BCPS Clinical Pharmacist Pager: [phone] 11/23/2014 3:44 PM

## 2014-11-23 NOTE — Progress Notes (Signed)
ANTICOAGULATION CONSULT NOTE - Follow Up Consult  Pharmacy Consult for heparin >> apixaban Indication: atrial fibrillation  No Known Allergies  Patient Measurements: Height: 5\' 4"  (162.6 cm) Weight: 143 lb (64.864 kg) IBW/kg (Calculated) : 54.7 Heparin Dosing Weight: 63.5 kg  Vital Signs: Temp: 98.2 F (36.8 C) (12/08 1436) Temp Source: Oral (12/08 1436) BP: 136/73 mmHg (12/08 1436) Pulse Rate: 66 (12/08 1436)  Labs:  Recent Labs  11/22/14 1445 11/22/14 1819 11/22/14 2325 11/23/14 0230 11/23/14 0533 11/23/14 1445  HGB 11.7*  --   --   --  11.8*  --   HCT 35.0*  --   --   --  35.1*  --   PLT 231  --   --   --  238  --   LABPROT  --  12.7  --   --   --   --   INR  --  0.95  --   --   --   --   HEPARINUNFRC  --   --   --  0.24*  --  0.35  CREATININE 0.66  --  0.73  --  0.69  --   TROPONINI  --   --  <0.30  --  <0.30 <0.30    Estimated Creatinine Clearance: 54.9 mL/min (by C-G formula based on Cr of 0.69).   Medications:  Infusions:  . sodium chloride      Assessment: 73 y/o female on a heparin drip for Afib. Plan now to switch to apixaban.  Will d/c heparin and start apixaban dosing tonight.    Goal of Therapy:  Monitor platelets by anticoagulation protocol: Yes   Plan:  D/C heparin and heparin labs Eliquis 5mg  PO BID Eliquis education Monitor for s/sx of bleeding  Toys 'R' UsKimberly Ahmani Prehn, Pharm.D., BCPS Clinical Pharmacist Pager [phone] 11/23/2014 6:10 PM

## 2014-11-23 NOTE — Progress Notes (Signed)
UR Completed Alyssamae Klinck Graves-Bigelow, RN,BSN [phone]  

## 2014-11-24 LAB — CBC
HEMATOCRIT: 31.7 % — AB (ref 36.0–46.0)
Hemoglobin: 10.5 g/dL — ABNORMAL LOW (ref 12.0–15.0)
MCH: 32.1 pg (ref 26.0–34.0)
MCHC: 33.1 g/dL (ref 30.0–36.0)
MCV: 96.9 fL (ref 78.0–100.0)
PLATELETS: 226 10*3/uL (ref 150–400)
RBC: 3.27 MIL/uL — AB (ref 3.87–5.11)
RDW: 13 % (ref 11.5–15.5)
WBC: 4.5 10*3/uL (ref 4.0–10.5)

## 2014-11-24 LAB — GLUCOSE, CAPILLARY
GLUCOSE-CAPILLARY: 84 mg/dL (ref 70–99)
Glucose-Capillary: 93 mg/dL (ref 70–99)
Glucose-Capillary: 110 mg/dL — ABNORMAL HIGH (ref 70–99)

## 2014-11-24 MED ORDER — AMOXICILLIN-POT CLAVULANATE 875-125 MG PO TABS: 1.0000 | ORAL_TABLET | Freq: Two times a day (BID) | ORAL | Status: DC

## 2014-11-24 MED ORDER — AMIODARONE HCL 200 MG PO TABS: 200.0000 mg | ORAL_TABLET | Freq: Two times a day (BID) | ORAL | Status: DC

## 2014-11-24 MED ORDER — APIXABAN 5 MG PO TABS: 5.0000 mg | ORAL_TABLET | Freq: Two times a day (BID) | ORAL | Status: DC

## 2014-11-24 MED ORDER — METOPROLOL TARTRATE 12.5 MG HALF TABLET: 12.5000 mg | ORAL_TABLET | Freq: Two times a day (BID) | ORAL | Status: DC

## 2014-11-24 NOTE — Progress Notes (Signed)
2320 --- Patient was discharged today but called up to [address] questioning which medications she needed to take tonight.  I spoke with her on the phone while looking to see what she had taken this morning and told her what pills to take in accordence with her discharge instructions.

## 2014-11-24 NOTE — Progress Notes (Signed)
  Echocardiogram 2D Echocardiogram has been performed.  Natividad Halls FRANCES 11/24/2014, 9:22 AM

## 2014-11-24 NOTE — Plan of Care (Signed)
Problem: Phase I Progression Outcomes Goal: Anginal pain relieved Outcome: Completed/Met Date Met:  11/24/14 Goal: Other Phase I Outcomes/Goals Outcome: Completed/Met Date Met:  11/24/14  Problem: Phase II Progression Outcomes Goal: Hemodynamically stable Outcome: Completed/Met Date Met:  11/24/14 Goal: Anginal pain relieved Outcome: Completed/Met Date Met:  11/24/14 Goal: Stress Test if indicated Outcome: Completed/Met Date Met:  11/24/14 Goal: Cath/PCI Day Path if indicated Outcome: Not Applicable Date Met:  37/29/02 Goal: CV Risk Factors identified Outcome: Completed/Met Date Met:  11/24/14 Goal: Cardiac Rehab if ordered Outcome: Not Applicable Date Met:  11/01/51 Goal: If positive for MI, change to MI Path Outcome: Not Applicable Date Met:  07/18/22 Goal: Other Phase II Outcomes/Goals Outcome: Completed/Met Date Met:  11/24/14

## 2014-11-24 NOTE — Discharge Summary (Signed)
  Discharge summary dictated on 11/24/2014 dictation number is [phone]

## 2014-11-24 NOTE — Progress Notes (Signed)
[place] [facility] [address] [place], [facility] [address] No prior authorization needed. Pt copay will be $150 dollars, pt given 30 day free card and instructed to let Dr. Sharyn LullHarwani know she would not be able to afford to continue after the 30 days due to copay. Emelda Brothershristy Keturah Yerby RN

## 2014-11-24 NOTE — Discharge Instructions (Signed)
Information on my medicine - ELIQUIS (apixaban)  This medication education was reviewed with me or my healthcare representative as part of my discharge preparation.  The pharmacist that spoke with me during my hospital stay was:  [facility], Maryanna Shape, [facility]  Why was Eliquis prescribed for you? Eliquis was prescribed for you to reduce the risk of a blood clot forming that can cause a stroke if you have a medical condition called atrial fibrillation (a type of irregular heartbeat).  What do You need to know about Eliquis ? Take your Eliquis TWICE DAILY - one tablet in the morning and one tablet in the evening with or without food. If you have difficulty swallowing the tablet whole please discuss with your pharmacist how to take the medication safely.  Take Eliquis exactly as prescribed by your doctor and DO NOT stop taking Eliquis without talking to the doctor who prescribed the medication.  Stopping may increase your risk of developing a stroke.  Refill your prescription before you run out.  After discharge, you should have regular check-up appointments with your healthcare provider that is prescribing your Eliquis.  In the future your dose may need to be changed if your kidney function or weight changes by a significant amount or as you get older.  What do you do if you miss a dose? If you miss a dose, take it as soon as you remember on the same day and resume taking twice daily.  Do not take more than one dose of ELIQUIS at the same time to make up a missed dose.  Important Safety Information A possible side effect of Eliquis is bleeding. You should call your healthcare provider right away if you experience any of the following: ? Bleeding from an injury or your nose that does not stop. ? Unusual colored urine (red or dark brown) or unusual colored stools (red or black). ? Unusual bruising for unknown reasons. ? A serious fall or if you hit your head (even if there is no  bleeding).  Some medicines may interact with Eliquis and might increase your risk of bleeding or clotting while on Eliquis. To help avoid this, consult your healthcare provider or pharmacist prior to using any new prescription or non-prescription medications, including herbals, vitamins, non-steroidal anti-inflammatory drugs (NSAIDs) and supplements.  This website has more information on Eliquis (apixaban): http://www.eliquis.com/eliquis/home Atrial Fibrillation Atrial fibrillation is a type of irregular heart rhythm (arrhythmia). During atrial fibrillation, the upper chambers of the heart (atria) quiver continuously in a chaotic pattern. This causes an irregular and often rapid heart rate.  Atrial fibrillation is the result of the heart becoming overloaded with disorganized signals that tell it to beat. These signals are normally released one at a time by a part of the right atrium called the sinoatrial node. They then travel from the atria to the lower chambers of the heart (ventricles), causing the atria and ventricles to contract and pump blood as they pass. In atrial fibrillation, parts of the atria outside of the sinoatrial node also release these signals. This results in two problems. First, the atria receive so many signals that they do not have time to fully contract. Second, the ventricles, which can only receive one signal at a time, beat irregularly and out of rhythm with the atria.  There are three types of atrial fibrillation:   Paroxysmal. Paroxysmal atrial fibrillation starts suddenly and stops on its own within a week.  Persistent. Persistent atrial fibrillation lasts for more than a week.  It may stop on its own or with treatment.  Permanent. Permanent atrial fibrillation does not go away. Episodes of atrial fibrillation may lead to permanent atrial fibrillation. Atrial fibrillation can prevent your heart from pumping blood normally. It increases your risk of stroke and can lead to  heart failure.  CAUSES   Heart conditions, including a heart attack, heart failure, coronary artery disease, and heart valve conditions.   Inflammation of the sac that surrounds the heart (pericarditis).  Blockage of an artery in the lungs (pulmonary embolism).  Pneumonia or other infections.  Chronic lung disease.  Thyroid problems, especially if the thyroid is overactive (hyperthyroidism).  Caffeine, excessive alcohol use, and use of some illegal drugs.   Use of some medicines, including certain decongestants and diet pills.  Heart surgery.   Birth defects.  Sometimes, no cause can be found. When this happens, the atrial fibrillation is called lone atrial fibrillation. The risk of complications from atrial fibrillation increases if you have lone atrial fibrillation and you are age 72 years or older. RISK FACTORS  Heart failure.  Coronary artery disease.  Diabetes mellitus.   High blood pressure (hypertension).   Obesity.   Other arrhythmias.   Increased age. SIGNS AND SYMPTOMS   A feeling that your heart is beating rapidly or irregularly.   A feeling of discomfort or pain in your chest.   Shortness of breath.   Sudden light-headedness or weakness.   Getting tired easily when exercising.   Urinating more often than normal (mainly when atrial fibrillation first begins).  In paroxysmal atrial fibrillation, symptoms may start and suddenly stop. DIAGNOSIS  Your health care provider may be able to detect atrial fibrillation when taking your pulse. Your health care provider may have you take a test called an ambulatory electrocardiogram (ECG). An ECG records your heartbeat patterns over a 24-hour period. You may also have other tests, such as:  Transthoracic echocardiogram (TTE). During echocardiography, sound waves are used to evaluate how blood flows through your heart.  Transesophageal echocardiogram (TEE).  Stress test. There is more than one  type of stress test. If a stress test is needed, ask your health care provider about which type is best for you.  Chest X-ray exam.  Blood tests.  Computed tomography (CT). TREATMENT  Treatment may include:  Treating any underlying conditions. For example, if you have an overactive thyroid, treating the condition may correct atrial fibrillation.  Taking medicine. Medicines may be given to control a rapid heart rate or to prevent blood clots, heart failure, or a stroke.  Having a procedure to correct the rhythm of the heart:  Electrical cardioversion. During electrical cardioversion, a controlled, low-energy shock is delivered to the heart through your skin. If you have chest pain, very low blood pressure, or sudden heart failure, this procedure may need to be done as an emergency.  Catheter ablation. During this procedure, heart tissues that send the signals that cause atrial fibrillation are destroyed.  Surgical ablation. During this surgery, thin lines of heart tissue that carry the abnormal signals are destroyed. This procedure can either be an open-heart surgery or a minimally invasive surgery. With the minimally invasive surgery, small cuts are made to access the heart instead of a large opening.  Pulmonary venous isolation. During this surgery, tissue around the veins that carry blood from the lungs (pulmonary veins) is destroyed. This tissue is thought to carry the abnormal signals. HOME CARE INSTRUCTIONS   Take medicines only as directed by your  health care provider. Some medicines can make atrial fibrillation worse or recur.  If blood thinners were prescribed by your health care provider, take them exactly as directed. Too much blood-thinning medicine can cause bleeding. If you take too little, you will not have the needed protection against stroke and other problems.  Perform blood tests at home if directed by your health care provider. Perform blood tests exactly as  directed.  Quit smoking if you smoke.  Do not drink alcohol.  Do not drink caffeinated beverages such as coffee, soda, and some teas. You may drink decaffeinated coffee, soda, or tea.   Maintain a healthy weight.Do not use diet pills unless your health care provider approves. They may make heart problems worse.   Follow diet instructions as directed by your health care provider.  Exercise regularly as directed by your health care provider.  Keep all follow-up visits as directed by your health care provider. This is important. PREVENTION  The following substances can cause atrial fibrillation to recur:   Caffeinated beverages.  Alcohol.  Certain medicines, especially those used for breathing problems.  Certain herbs and herbal medicines, such as those containing ephedra or ginseng.  Illegal drugs, such as cocaine and amphetamines. Sometimes medicines are given to prevent atrial fibrillation from recurring. Proper treatment of any underlying condition is also important in helping prevent recurrence.  SEEK MEDICAL CARE IF:  You notice a change in the rate, rhythm, or strength of your heartbeat.  You suddenly begin urinating more frequently.  You tire more easily when exerting yourself or exercising. SEEK IMMEDIATE MEDICAL CARE IF:   You have chest pain, abdominal pain, sweating, or weakness.  You feel nauseous.  You have shortness of breath.  You suddenly have swollen feet and ankles.  You feel dizzy.  Your face or limbs feel numb or weak.  You have a change in your vision or speech. MAKE SURE YOU:   Understand these instructions.  Will watch your condition.  Will get help right away if you are not doing well or get worse. Document Released: 12/03/2005 Document Revised: 04/19/2014 Document Reviewed: 01/13/2013 [facility] Institute[facility] Patient Information 2015 [place], [place]. This information is not intended to replace advice given to you by your health care provider. Make  sure you discuss any questions you have with your health care provider.

## 2014-11-25 NOTE — Discharge Summary (Signed)
NAMVernell Barrier:  Ball, Jacqueline Ball                 ACCOUNT NO.:  [id number]  MEDICAL RECORD NO.:  [id number]  LOCATION:  [id number]                        FACILITY:  [facility]  PHYSICIAN:  Faithe Ariola N. Sharyn LullHarwani, M.D. DATE OF BIRTH:  1941-08-25  DATE OF ADMISSION:  11/22/2014 DATE OF DISCHARGE:  11/24/2014                              DISCHARGE SUMMARY   ADMITTING DIAGNOSES: 1. Chest pain, rule out myocardial infarction. 2. New-onset paroxysmal atrial fibrillation, flutter with controlled     ventricular response. 3. Uncontrolled hypertension. 4. Non-insulin-dependent diabetes mellitus. 5. Hypercholesteremia. 6. Anxiety disorder. 7. Bronchitis.  FINAL DIAGNOSES: 1. Status post chest pain, myocardial infarction ruled out, negative     nuclear stress test. 2. Status post paroxysmal new-onset atrial fibrillation, flutter,     converted back to sinus rhythm. 3. Hypertension. 4. Non-insulin-dependent diabetes mellitus. 5. Hypercholesteremia. 6. Anxiety disorder. 7. Resolving bronchitis.  DISCHARGE HOME MEDICATIONS: 1. Amiodarone 200 mg one tablet twice daily. 2. Augmentin 875 mg one tablet twice daily for 5 more days. 3. Eliquis 5 mg twice daily. 4. Metoprolol tartrate 12.5 mg twice daily. 5. Tylenol 500 mg every 6 hours as needed for pain. 6. Align 4 mg capsule daily as before. 7. Atorvastatin 20 mg one tablet daily as before. 8. Questran 4 g twice daily as before. 9. Amaryl 4 mg tablet, half tablet daily as before. 10.Lorazepam 1 mg daily as before. 11.Nexium 40 mg daily. 12.Detrol LA one capsule daily. 13.Valtrex 500 mg daily.  The patient has been advised to stop     Colcrys, estradiol, hydrocodone, meloxicam and tramadol.  DIET:  Low salt, low cholesterol, 1800 calories ADA diet.  The patient has been advised to monitor blood pressure and blood sugar daily and chart.  We will reduce the dose of amiodarone as outpatient as appropriate.  CONDITION AT DISCHARGE:  Stable.  BRIEF HISTORY AND  HOSPITAL COURSE:  Ms. Jacqueline Ball is a 73 year old female with past medical history significant for hypertension, non- insulin-dependent diabetes mellitus, hypercholesteremia, anxiety disorder, history of gouty arthritis, she came to the ER complaining of retrosternal chest pressure associated with shortness of breath and palpitation off and on since last night, also complains of cough with chest congestion.  Denies any fever.  Denies sore throat.  The patient initially was in normal sinus rhythm and subsequently was noted on monitor to have paroxysmal atrial fibrillation with controlled ventricular response.  The patient denies any recent weight loss. Denies any thyroid problems in the past.  Denies history of rheumatic fever in the past.  PHYSICAL EXAMINATION:  GENERAL:  She was alert, awake, oriented x3. VITAL SIGNS:  Blood pressure was 180/100, pulse was 90-100, irregularly irregular. HEENT:  Conjunctiva was pink. NECK:  Supple.  No JVD.  No bruit. LUNGS:  Clear to auscultation without rhonchi or rales. CARDIOVASCULAR:  Irregularly irregular.  S1, S2 were soft.  There was soft systolic murmur.  No S3, gallop. ABDOMEN:  Soft.  Bowel sounds were present.  Nontender. EXTREMITIES:  There was no clubbing, cyanosis, or edema. NEUROLOGIC:  Grossly intact.  LABORATORY DATA:  Sodium was 144, potassium 3.4, BUN 10, creatinine 0.66.  Two sets of cardiac enzymes were negative.  Hemoglobin  was 11.7, hematocrit 35, white count of 5.6.  Her TSH was normal at 1.38. Hemoglobin A1c was 5.7.  Admission EKG:  First EKG showed normal sinus rhythm, nonspecific T-wave changes.  Repeat EKG done after few hours showed atrial fibrillation with moderate ventricular response, nonspecific T-wave changes.  Repeat EKG done on early this morning showed normal sinus rhythm with nonspecific T-wave changes, normal QTc interval.  A 2D echo showed normal LV systolic function, normal left atrial size, no evidence of  left atrial thrombus in this transthoracic study.  Nuclear stress test showed no evidence of reversible ischemia or infarct with normal EF of 64%.  Her cholesterol was 113, triglycerides 78, LDL 53, HDL 44.  BRIEF HOSPITAL COURSE:  The patient was admitted to telemetry unit.  MI was ruled out by serial enzymes and EKG.  The patient was started on IV heparin and IV amiodarone with subsequent conversion to normal sinus rhythm.  The patient remained in sinus rhythm.  IV amiodarone was switched to p.o. amiodarone, which she is tolerating well.  IV heparin was switched to Eliquis, which she is tolerating well.  After reviewing her nuclear stress test, which was negative for ischemia.  The patient is up and about, walking in the room without any problems.  The patient will be discharged home on above medications and will be followed up in my office in 2 weeks.     Eduardo OsierMohan N. Sharyn LullHarwani, M.D.     [facility]  D:  11/24/2014  T:  11/25/2014  Job:  [id number]

## 2015-03-10 ENCOUNTER — Emergency Department (HOSPITAL_COMMUNITY)
Admission: EM | Admit: 2015-03-10 | Discharge: 2015-03-10 | Disposition: A | Discharge status: Home / Self Care | Payer: Medicare HMO | Attending: Emergency Medicine | Admitting: Emergency Medicine

## 2015-03-10 ENCOUNTER — Encounter (HOSPITAL_COMMUNITY): Payer: Self-pay | Admitting: *Deleted

## 2015-03-10 ENCOUNTER — Emergency Department (HOSPITAL_COMMUNITY): Payer: Medicare HMO

## 2015-03-10 DIAGNOSIS — Z7901 Long term (current) use of anticoagulants: Secondary | ICD-10-CM | POA: Insufficient documentation

## 2015-03-10 DIAGNOSIS — R109 Unspecified abdominal pain: Secondary | ICD-10-CM

## 2015-03-10 DIAGNOSIS — I10 Essential (primary) hypertension: Secondary | ICD-10-CM | POA: Diagnosis not present

## 2015-03-10 DIAGNOSIS — Z792 Long term (current) use of antibiotics: Secondary | ICD-10-CM | POA: Insufficient documentation

## 2015-03-10 DIAGNOSIS — N289 Disorder of kidney and ureter, unspecified: Secondary | ICD-10-CM | POA: Insufficient documentation

## 2015-03-10 DIAGNOSIS — R1032 Left lower quadrant pain: Secondary | ICD-10-CM

## 2015-03-10 DIAGNOSIS — Z79899 Other long term (current) drug therapy: Secondary | ICD-10-CM | POA: Insufficient documentation

## 2015-03-10 DIAGNOSIS — Z8659 Personal history of other mental and behavioral disorders: Secondary | ICD-10-CM | POA: Insufficient documentation

## 2015-03-10 DIAGNOSIS — E119 Type 2 diabetes mellitus without complications: Secondary | ICD-10-CM | POA: Diagnosis not present

## 2015-03-10 DIAGNOSIS — R1084 Generalized abdominal pain: Secondary | ICD-10-CM | POA: Diagnosis present

## 2015-03-10 DIAGNOSIS — E785 Hyperlipidemia, unspecified: Secondary | ICD-10-CM | POA: Diagnosis not present

## 2015-03-10 LAB — COMPREHENSIVE METABOLIC PANEL
ALBUMIN: 4.3 g/dL (ref 3.5–5.2)
ALK PHOS: 78 U/L (ref 39–117)
ALT: 31 U/L (ref 0–35)
AST: 28 U/L (ref 0–37)
Anion gap: 9 (ref 5–15)
BILIRUBIN TOTAL: 0.5 mg/dL (ref 0.3–1.2)
BUN: 11 mg/dL (ref 6–23)
CO2: 26 mmol/L (ref 19–32)
CREATININE: 0.91 mg/dL (ref 0.50–1.10)
Calcium: 8.9 mg/dL (ref 8.4–10.5)
Chloride: 104 mmol/L (ref 96–112)
GFR calc Af Amer: 71 mL/min — ABNORMAL LOW (ref >90)
GFR, EST NON AFRICAN AMERICAN: 61 mL/min — AB (ref >90)
Glucose, Bld: 68 mg/dL — ABNORMAL LOW (ref 70–99)
POTASSIUM: 4 mmol/L (ref 3.5–5.1)
SODIUM: 139 mmol/L (ref 135–145)
Total Protein: 7.7 g/dL (ref 6.0–8.3)

## 2015-03-10 LAB — URINALYSIS, ROUTINE W REFLEX MICROSCOPIC
Bilirubin Urine: NEGATIVE
GLUCOSE, UA: NEGATIVE mg/dL
HGB URINE DIPSTICK: NEGATIVE
KETONES UR: NEGATIVE mg/dL
Leukocytes, UA: NEGATIVE
Nitrite: NEGATIVE
Protein, ur: NEGATIVE mg/dL
Specific Gravity, Urine: 1.019 (ref 1.005–1.030)
Urobilinogen, UA: 0.2 mg/dL (ref 0.0–1.0)
pH: 6 (ref 5.0–8.0)

## 2015-03-10 LAB — LIPASE, BLOOD: LIPASE: 22 U/L (ref 11–59)

## 2015-03-10 LAB — CBC WITH DIFFERENTIAL/PLATELET
Basophils Absolute: 0 10*3/uL (ref 0.0–0.1)
Basophils Relative: 0 % (ref 0–1)
EOS ABS: 0 10*3/uL (ref 0.0–0.7)
Eosinophils Relative: 1 % (ref 0–5)
HCT: 41.7 % (ref 36.0–46.0)
Hemoglobin: 13.9 g/dL (ref 12.0–15.0)
Lymphocytes Relative: 34 % (ref 12–46)
Lymphs Abs: 1.5 10*3/uL (ref 0.7–4.0)
MCH: 33.4 pg (ref 26.0–34.0)
MCHC: 33.3 g/dL (ref 30.0–36.0)
MCV: 100.2 fL — AB (ref 78.0–100.0)
MONOS PCT: 14 % — AB (ref 3–12)
Monocytes Absolute: 0.6 10*3/uL (ref 0.1–1.0)
NEUTROS ABS: 2.3 10*3/uL (ref 1.7–7.7)
NEUTROS PCT: 51 % (ref 43–77)
Platelets: 344 10*3/uL (ref 150–400)
RBC: 4.16 MIL/uL (ref 3.87–5.11)
RDW: 13.1 % (ref 11.5–15.5)
WBC: 4.4 10*3/uL (ref 4.0–10.5)

## 2015-03-10 MED ORDER — IOHEXOL 300 MG/ML  SOLN
100.0000 mL | Freq: Once | INTRAMUSCULAR | Status: AC | PRN
  Administered 2015-03-10: 100 mL via INTRAVENOUS

## 2015-03-10 MED ORDER — SUCRALFATE 1 GM/10ML PO SUSP: 1.0000 g | Freq: Three times a day (TID) | ORAL | Status: DC

## 2015-03-10 MED ORDER — IOHEXOL 300 MG/ML  SOLN
50.0000 mL | Freq: Once | INTRAMUSCULAR | Status: AC | PRN
  Administered 2015-03-10: 50 mL via ORAL

## 2015-03-10 NOTE — Discharge Instructions (Signed)
Abdominal Pain, Women °Abdominal (stomach, pelvic, or belly) pain can be caused by many things. It is important to tell your doctor: °· The location of the pain. °· Does it come and go or is it present all the time? °· Are there things that start the pain (eating certain foods, exercise)? °· Are there other symptoms associated with the pain (fever, nausea, vomiting, diarrhea)? °All of this is helpful to know when trying to find the cause of the pain. °CAUSES  °· Stomach: virus or bacteria infection, or ulcer. °· Intestine: appendicitis (inflamed appendix), regional ileitis (Crohn's disease), ulcerative colitis (inflamed colon), irritable bowel syndrome, diverticulitis (inflamed diverticulum of the colon), or cancer of the stomach or intestine. °· Gallbladder disease or stones in the gallbladder. °· Kidney disease, kidney stones, or infection. °· Pancreas infection or cancer. °· Fibromyalgia (pain disorder). °· Diseases of the female organs: °¨ Uterus: fibroid (non-cancerous) tumors or infection. °¨ Fallopian tubes: infection or tubal pregnancy. °¨ Ovary: cysts or tumors. °¨ Pelvic adhesions (scar tissue). °¨ Endometriosis (uterus lining tissue growing in the pelvis and on the pelvic organs). °¨ Pelvic congestion syndrome (female organs filling up with blood just before the menstrual period). °¨ Pain with the menstrual period. °¨ Pain with ovulation (producing an egg). °¨ Pain with an IUD (intrauterine device, birth control) in the uterus. °¨ Cancer of the female organs. °· Functional pain (pain not caused by a disease, may improve without treatment). °· Psychological pain. °· Depression. °DIAGNOSIS  °Your doctor will decide the seriousness of your pain by doing an examination. °· Blood tests. °· X-rays. °· Ultrasound. °· CT scan (computed tomography, special type of X-ray). °· MRI (magnetic resonance imaging). °· Cultures, for infection. °· Barium enema (dye inserted in the large intestine, to better view it with  X-rays). °· Colonoscopy (looking in intestine with a lighted tube). °· Laparoscopy (minor surgery, looking in abdomen with a lighted tube). °· Major abdominal exploratory surgery (looking in abdomen with a large incision). °TREATMENT  °The treatment will depend on the cause of the pain.  °· Many cases can be observed and treated at home. °· Over-the-counter medicines recommended by your caregiver. °· Prescription medicine. °· Antibiotics, for infection. °· Birth control pills, for painful periods or for ovulation pain. °· Hormone treatment, for endometriosis. °· Nerve blocking injections. °· Physical therapy. °· Antidepressants. °· Counseling with a psychologist or psychiatrist. °· Minor or major surgery. °HOME CARE INSTRUCTIONS  °· Do not take laxatives, unless directed by your caregiver. °· Take over-the-counter pain medicine only if ordered by your caregiver. Do not take aspirin because it can cause an upset stomach or bleeding. °· Try a clear liquid diet (broth or water) as ordered by your caregiver. Slowly move to a bland diet, as tolerated, if the pain is related to the stomach or intestine. °· Have a thermometer and take your temperature several times a day, and record it. °· Bed rest and sleep, if it helps the pain. °· Avoid sexual intercourse, if it causes pain. °· Avoid stressful situations. °· Keep your follow-up appointments and tests, as your caregiver orders. °· If the pain does not go away with medicine or surgery, you may try: °¨ Acupuncture. °¨ Relaxation exercises (yoga, meditation). °¨ Group therapy. °¨ Counseling. °SEEK MEDICAL CARE IF:  °· You notice certain foods cause stomach pain. °· Your home care treatment is not helping your pain. °· You need stronger pain medicine. °· You want your IUD removed. °· You feel faint or   lightheaded. °· You develop nausea and vomiting. °· You develop a rash. °· You are having side effects or an allergy to your medicine. °SEEK IMMEDIATE MEDICAL CARE IF:  °· Your  pain does not go away or gets worse. °· You have a fever. °· Your pain is felt only in portions of the abdomen. The right side could possibly be appendicitis. The left lower portion of the abdomen could be colitis or diverticulitis. °· You are passing blood in your stools (bright red or black tarry stools, with or without vomiting). °· You have blood in your urine. °· You develop chills, with or without a fever. °· You pass out. °MAKE SURE YOU:  °· Understand these instructions. °· Will watch your condition. °· Will get help right away if you are not doing well or get worse. °Document Released: 09/30/2007 Document Revised: 04/19/2014 Document Reviewed: 10/20/2009 °ExitCare® Patient Information ©2015 [employer]. This information is not intended to replace advice given to you by your health care provider. Make sure you discuss any questions you have with your health care provider. ° °

## 2015-03-10 NOTE — ED Provider Notes (Addendum)
CSN: [id number]     Arrival date & time 03/10/15  1914 History   First MD Initiated Contact with Patient 03/10/15 [phone]     Chief Complaint  Patient presents with  . Abdominal Cramping     (Consider location/radiation/quality/duration/timing/severity/associated sxs/prior Treatment) Patient is a 74 y.o. female presenting with cramps. The history is provided by the patient.  Abdominal Cramping This is a new problem. Episode onset: One week ago. The problem occurs constantly. The problem has been gradually worsening. Associated symptoms include abdominal pain. Pertinent negatives include no chest pain, no headaches and no shortness of breath. Associated symptoms comments: Nausea without vomiting. No stool changes. Decreased appetite. Exacerbated by: Initially thought that eating made it better however now she gets worsening pain after eating. She has tried nothing for the symptoms. The treatment provided no relief.    Past Medical History  Diagnosis Date  . Hypertension   . Diabetes mellitus   . Physical exam, annual 10/22/06  . Anxiety   . Hyperlipidemia    Past Surgical History  Procedure Laterality Date  . Abdominal hysterectomy     No family history on file. History  Substance Use Topics  . Smoking status: Never Smoker   . Smokeless tobacco: Not on file  . Alcohol Use: No   OB History    No data available     Review of Systems  Respiratory: Negative for shortness of breath.   Cardiovascular: Negative for chest pain.  Gastrointestinal: Positive for abdominal pain.  Neurological: Negative for headaches.  All other systems reviewed and are negative.     Allergies  Review of patient's allergies indicates no known allergies.  Home Medications   Prior to Admission medications   Medication Sig Start Date End Date Taking? Authorizing Provider  acetaminophen (TYLENOL) 500 MG tablet Take 500 mg by mouth every 6 (six) hours as needed for pain.    Historical Provider, MD   amiodarone (PACERONE) 200 MG tablet Take 1 tablet (200 mg total) by mouth 2 (two) times daily. 11/24/14   Rinaldo Cloud, MD  amoxicillin-clavulanate (AUGMENTIN) 875-125 MG per tablet Take 1 tablet by mouth every 12 (twelve) hours. 11/24/14   Rinaldo Cloud, MD  apixaban (ELIQUIS) 5 MG TABS tablet Take 1 tablet (5 mg total) by mouth 2 (two) times daily. 11/24/14   Rinaldo Cloud, MD  atorvastatin (LIPITOR) 20 MG tablet Take 20 mg by mouth daily.  03/26/12   Historical Provider, MD  cholestyramine Lanetta Inch) 4 GM/DOSE powder Take 4 g by mouth 2 (two) times daily.  05/28/12   Historical Provider, MD  glimepiride (AMARYL) 4 MG tablet Take 2 mg by mouth daily before breakfast.     Historical Provider, MD  LORazepam (ATIVAN) 1 MG tablet Take 1 mg by mouth daily.  05/27/12   Historical Provider, MD  metoprolol tartrate (LOPRESSOR) 12.5 mg TABS tablet Take 0.5 tablets (12.5 mg total) by mouth 2 (two) times daily. 11/24/14   Rinaldo Cloud, MD  NEXIUM 40 MG capsule Take 40 mg by mouth daily. 11/02/13   Historical Provider, MD  Probiotic Product (ALIGN) 4 MG CAPS Take by mouth daily.    Historical Provider, MD  tolterodine (DETROL LA) 4 MG 24 hr capsule Take 1 capsule by mouth daily. 11/07/14   Historical Provider, MD  valACYclovir (VALTREX) 500 MG tablet Take 1 tablet by mouth daily. 11/03/14   Historical Provider, MD   BP 128/75 mmHg  Pulse 58  Temp(Src) 97.6 F (36.4 C) (Oral)  Resp 18  SpO2 97% Physical Exam  Constitutional: She is oriented to person, place, and time. She appears well-developed and well-nourished. No distress.  HENT:  Head: Normocephalic and atraumatic.  Mouth/Throat: Oropharynx is clear and moist.  Eyes: Conjunctivae and EOM are normal. Pupils are equal, round, and reactive to light.  Neck: Normal range of motion. Neck supple.  Cardiovascular: Normal rate, regular rhythm and intact distal pulses.   No murmur heard. Pulmonary/Chest: Effort normal and breath sounds normal. No  respiratory distress. She has no wheezes. She has no rales.  Abdominal: Soft. She exhibits no distension. There is tenderness in the left lower quadrant. There is guarding. There is no rebound and no CVA tenderness.  Mild tenderness in the left upper quadrant  Musculoskeletal: Normal range of motion. She exhibits no edema or tenderness.  Neurological: She is alert and oriented to person, place, and time.  Skin: Skin is warm and dry. No rash noted. No erythema.  Psychiatric: She has a normal mood and affect. Her behavior is normal.  Nursing note and vitals reviewed.   ED Course  Procedures (including critical care time) Labs Review Labs Reviewed  CBC WITH DIFFERENTIAL/PLATELET - Abnormal; Notable for the following:    MCV 100.2 (*)    Monocytes Relative 14 (*)    All other components within normal limits  COMPREHENSIVE METABOLIC PANEL - Abnormal; Notable for the following:    Glucose, Bld 68 (*)    GFR calc non Af Amer 61 (*)    GFR calc Af Amer 71 (*)    All other components within normal limits  URINALYSIS, ROUTINE W REFLEX MICROSCOPIC - Abnormal; Notable for the following:    APPearance CLOUDY (*)    All other components within normal limits  LIPASE, BLOOD    Imaging Review Ct Abdomen Pelvis W Contrast  03/10/2015   CLINICAL DATA:  Abdominal pain and cramping for 2 weeks, left lower quadrant pain  EXAM: CT ABDOMEN AND PELVIS WITH CONTRAST  TECHNIQUE: Multidetector CT imaging of the abdomen and pelvis was performed using the standard protocol following bolus administration of intravenous contrast.  CONTRAST:  100mL OMNIPAQUE IOHEXOL 300 MG/ML SOLN, 50mL OMNIPAQUE IOHEXOL 300 MG/ML SOLN  COMPARISON:  05/08/2012  FINDINGS: Sagittal images of the spine shows mild degenerative changes thoracolumbar spine. Significant disc space flattening with endplate sclerotic changes and vacuum disc phenomenon noted at L5-S1 level. Atherosclerotic calcifications of abdominal aorta and iliac arteries.   Lung bases are unremarkable.  Enhanced liver shows no biliary ductal dilatation. Scattered hepatic cysts are stable in size in appearance from prior exam.  Unenhanced pancreas, spleen and adrenal glands are unremarkable. Enhanced kidneys are symmetrical in size. Multiple left renal cysts are again noted. Multiple clustered exophytic cysts midpole of the left kidney are stable in size in appearance from prior exam. A dominant cyst in lower pole of the left kidney measures 3.5 cm stable from prior exam. Axial image 33 there is stable hyperdense exophytic cyst mid to lower pole of the left kidney measures 1.2 cm. Axial image 38 there is hyperdense exophytic lesion in lower pole anterior aspect of the left kidney measures 7 mm. A solid lesion cannot be excluded. Further correlation with enhanced MRI is recommended.  No aortic aneurysm.  There is no hydronephrosis or hydroureter. Delayed renal images shows bilateral renal symmetrical excretion. Bilateral visualized proximal ureter is unremarkable.  No small bowel obstruction. No ascites or free air. No adenopathy. No pericecal inflammation. Normal appendix clearly visualized axial image 58.  Few diverticula are noted descending colon. Scattered diverticula are noted in proximal sigmoid colon. The sigmoid colon is redundant looping in right lower quadrant. Moderate gas noted in right lower quadrant within sigmoid colon. There is no distal colonic obstruction. Moderate gas is noted within rectum.  Urinary bladder is unremarkable. The patient is status post hysterectomy. There is no evidence of acute diverticulitis.  IMPRESSION: 1. Few diverticula are noted descending colon. Colonic diverticula are noted in proximal sigmoid colon. There is no evidence of acute diverticulitis. 2. There is a redundant sigmoid colon looping in right lower quadrant. Moderate gas is noted within sigmoid colon in right lower quadrant. Moderate gas noted within rectum. No distal colonic  obstruction. 3. No pericecal inflammation.  Normal appendix. 4. Again noted multiple left renal exophytic cysts. Majority of this lesions are stable. There is exophytic lesion in lower pole anterior aspect of the left kidney measures 7 mm. Although this may represent a hyperdense cyst a solid lesion cannot be excluded. Further evaluation with enhanced MRI is recommended. 5. No aortic aneurysm. 6. No small bowel obstruction.   Electronically Signed   By: Natasha Mead M.D.   On: 03/10/2015 12:09     EKG Interpretation None      MDM   Final diagnoses:  LLQ pain  Kidney lesion  Abdominal pain, unspecified abdominal location    Patient with abdominal pain for the last 1 week that's gradually worsening. Localized mostly to the left lower quadrant. Patient denies fever, vomiting, change in bowel movements. However she does have some nausea. Initially she felt like eating improve the pain but it gets worse shortly after eating. She recently was changed from Nexium to another PPI due to cost about 1 week ago but states she was having the pain before this occurred.  Vital signs are within normal limits however on exam patient has significant tenderness in the left lower quadrant with concern for diverticulitis versus pancreatitis versus gastritis. Low suspicion for appendicitis or cholecystitis at this time. Patient has no chest or respiratory complaints with low suspicion for cardiac etiology. CBC, CMP, lipase, UA, CT of the abdomen and pelvis pending.  12:46 PM Labs without significant findings. CT without evidence of acute pathology. Will treat for gastritis and start patient on Carafate. Encouraged her to continue her PPI and follow-up with Dr. Brynda Rim with GI.  Also will have her f/u with her PCP for outpt MRI of the left kidney  Gwyneth Sprout, MD 03/10/15 1247  Gwyneth Sprout, MD 03/10/15 1249

## 2015-03-10 NOTE — ED Notes (Signed)
Patient states that she has been having abdominal cramping all day on and off. Nothing seems to make it better or worse. There is no relation to food. Patient denies constipation and/or diarrhea. She states that her bowel movements have been normal.

## 2015-03-10 NOTE — ED Notes (Signed)
Questions, concerns denied r/t dc. Pt ambulatory and a&ox4 

## 2015-03-16 ENCOUNTER — Other Ambulatory Visit: Payer: Self-pay | Admitting: Internal Medicine

## 2015-03-16 DIAGNOSIS — N2889 Other specified disorders of kidney and ureter: Secondary | ICD-10-CM

## 2015-03-28 ENCOUNTER — Ambulatory Visit
Admission: RE | Admit: 2015-03-28 | Discharge: 2015-03-28 | Disposition: A | Discharge status: Home / Self Care | Payer: Medicare HMO | Source: Ambulatory Visit | Attending: Internal Medicine | Admitting: Internal Medicine

## 2015-03-28 DIAGNOSIS — N2889 Other specified disorders of kidney and ureter: Secondary | ICD-10-CM

## 2015-03-28 MED ORDER — GADOBENATE DIMEGLUMINE 529 MG/ML IV SOLN
13.0000 mL | Freq: Once | INTRAVENOUS | Status: AC | PRN
  Administered 2015-03-28: 13 mL via INTRAVENOUS

## 2015-03-29 ENCOUNTER — Inpatient Hospital Stay: Admission: RE | Admit: 2015-03-29 | Payer: Medicare HMO | Source: Ambulatory Visit

## 2015-10-31 ENCOUNTER — Ambulatory Visit (INDEPENDENT_AMBULATORY_CARE_PROVIDER_SITE_OTHER): Payer: Medicare PPO | Admitting: Neurology

## 2015-10-31 ENCOUNTER — Encounter: Payer: Self-pay | Admitting: Neurology

## 2015-10-31 VITALS — BP 109/66 | HR 55 | Ht 64.0 in | Wt 124.4 lb

## 2015-10-31 DIAGNOSIS — I10 Essential (primary) hypertension: Secondary | ICD-10-CM

## 2015-10-31 DIAGNOSIS — R4189 Other symptoms and signs involving cognitive functions and awareness: Secondary | ICD-10-CM | POA: Diagnosis not present

## 2015-10-31 DIAGNOSIS — E119 Type 2 diabetes mellitus without complications: Secondary | ICD-10-CM | POA: Diagnosis not present

## 2015-10-31 DIAGNOSIS — I482 Chronic atrial fibrillation, unspecified: Secondary | ICD-10-CM

## 2015-10-31 NOTE — Patient Instructions (Signed)
-   will do some blood draw today - will refer you to see neuropsychologist to do the psychology testing - repeat testing next visit and compare - follow up in 3 months.

## 2015-11-01 LAB — FOLATE: Folate: 8.9 ng/mL (ref >3.0)

## 2015-11-01 LAB — RPR: RPR: NONREACTIVE

## 2015-11-01 LAB — VITAMIN B12: VITAMIN B 12: 401 pg/mL (ref 211–946)

## 2015-11-02 DIAGNOSIS — I482 Chronic atrial fibrillation, unspecified: Secondary | ICD-10-CM | POA: Insufficient documentation

## 2015-11-02 DIAGNOSIS — I10 Essential (primary) hypertension: Secondary | ICD-10-CM | POA: Insufficient documentation

## 2015-11-02 DIAGNOSIS — E119 Type 2 diabetes mellitus without complications: Secondary | ICD-10-CM | POA: Insufficient documentation

## 2015-11-02 NOTE — Progress Notes (Signed)
NEUROLOGY CLINIC NEW PATIENT NOTE  NAME: [name] DOB: [birth year] REFERRING PHYSICIAN: Rogers Blocker, MD  I saw Jacqueline Ball as a new consult in the neurovascular clinic today regarding  Chief Complaint  Patient presents with  . Referral    from [facility] having memory issues  .  HPI: Jacqueline Ball is a 74 y.o. female with PMH of HTN, DM and afib on eliquis who presents as a new patient for Cognitive impairment.   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 Complains of HA at that time. She went to ED and had CT head and neck done showed no acute abnormality and no cervical fracture.   She went back to ED again on 08/26/15 for ongoing dizziness and difficulty with concentrating since the fall. Repeat head CT no acute abnormality and was diagnosed with post concussion syndrome. Since then, she had ongoing difficulty with class, decreased memory, although admit that she noted having difficulty with class prior to fall. Eventually, she failed the class recently. She went to see her PCP and was referred here for further evaluation.   I tried to get collateral information from her husband in clinic today but he is a poor historian and can not provide further information. Pt stated that she seems in a reasonable intelligence prior to fall but I can not confirm it at this time.   She has hx of HTN and DM on metoprolol and metformin. She also has afib on amiodarone and eliquis and she has been following with Dr. Terrence Dupont. She is also on lipitor and cholestyramine. She is also on estrogen patch as well as plaquenil for arthritis.   She denies smoking, alcohol or illicit drugs.   Past Medical History  Diagnosis Date  . Hypertension   . Diabetes mellitus   . Physical exam, annual 10/22/06  . Anxiety   . Hyperlipidemia   . Memory loss   . Atrial fibrillation ([place])   . Renal cyst   .  Mood disorder [facility])    Past Surgical History  Procedure Laterality Date  . Abdominal hysterectomy     Family History  Problem Relation Age of Onset  . Diabetes Sister    Current Outpatient Prescriptions  Medication Sig Dispense Refill  . acetaminophen (TYLENOL) 500 MG tablet Take 500 mg by mouth every 6 (six) hours as needed for pain.    [name] amiodarone (PACERONE) 200 MG tablet Take 1 tablet (200 mg total) by mouth 2 (two) times daily. 60 tablet 3  . amoxicillin-clavulanate (AUGMENTIN) 875-125 MG per tablet Take 1 tablet by mouth every 12 (twelve) hours. 10 tablet 0  . apixaban (ELIQUIS) 5 MG TABS tablet Take 1 tablet (5 mg total) by mouth 2 (two) times daily. 60 tablet 3  . atorvastatin (LIPITOR) 20 MG tablet Take 20 mg by mouth daily.     . Blood Glucose Monitoring Suppl (TRUE METRIX AIR GLUCOSE METER) W/DEVICE KIT     . cholestyramine (QUESTRAN) 4 GM/DOSE powder Take 4 g by mouth daily.     . colchicine 0.6 MG tablet Take 0.6 mg by mouth daily.    [name] esomeprazole (NEXIUM) 40 MG capsule Take 40 mg by mouth daily at 12 noon.    [name] estradiol (CLIMARA) 0.06 MG/24HR Place 1 patch onto the skin every Tuesday.    . hydroxychloroquine (PLAQUENIL) 200 MG tablet Take  200 mg by mouth daily as needed (for arthritis).    [name] ibuprofen (ADVIL,MOTRIN) 400 MG tablet Take 400 mg by mouth.    [name] LORazepam (ATIVAN) 1 MG tablet Take 1 mg by mouth daily.     . Mesalamine (ASACOL) 400 MG CPDR DR capsule Take 800 mg by mouth.    . metFORMIN (GLUCOPHAGE) 500 MG tablet     . metoprolol tartrate (LOPRESSOR) 12.5 mg TABS tablet Take 0.5 tablets (12.5 mg total) by mouth 2 (two) times daily. 30 tablet 3  . metoprolol tartrate (LOPRESSOR) 25 MG tablet Take 12.5 mg by mouth 2 (two) times daily.    . potassium chloride SA (K-DUR,KLOR-CON) 20 MEQ tablet Take 20 mEq by mouth 2 (two) times daily.    . Probiotic Product (ALIGN) 4 MG CAPS Take 1 capsule by mouth daily.     . sucralfate (CARAFATE) 1 GM/10ML suspension Take 10  mLs (1 g total) by mouth 4 (four) times daily -  with meals and at bedtime. 420 mL 0  . tolterodine (DETROL LA) 4 MG 24 hr capsule Take 1 capsule by mouth daily.    . TRUE METRIX BLOOD GLUCOSE TEST test strip     . TRUEPLUS LANCETS 28G MISC     . valACYclovir (VALTREX) 500 MG tablet     . vitamin E 400 UNIT capsule Take by mouth.     No current facility-administered medications for this visit.   Allergies  Allergen Reactions  . Metoprolol Nausea And Vomiting   Social History   Social History  . Marital Status: Married    Spouse Name: EDDIE Iannelli  . Number of Children: N/A  . Years of Education: N/A   Occupational History  . Not on file.   Social History Main Topics  . Smoking status: Never Smoker   . Smokeless tobacco: Not on file  . Alcohol Use: No  . Drug Use: No  . Sexual Activity: Not on file   Other Topics Concern  . Not on file   Social History Narrative    Review of Systems Full 14 system review of systems performed and notable only for those listed, all others are neg:  Constitutional:  Weight loss, fatigue Cardiovascular:  Ear/Nose/Throat:   Skin:  Eyes:  Blurry vision Respiratory:   Gastroitestinal:  Incontinence, diarrhea Genitourinary:  Hematology/Lymphatic:   Endocrine: feeling cold Musculoskeletal:   Allergy/Immunology:   Neurological:  Memory loss, dizziness Psychiatric: anxiety, decreased energy, change in appetite Sleep:    Physical Exam  Filed Vitals:   10/31/15 1425  BP: 109/66  Pulse: 55    General - Well nourished, well developed, in no apparent distress.  Ophthalmologic - fundi not visualized due to noncooperation.  Cardiovascular - irregularly irregular heart rate and rhythm.   Neck - supple, no nuchal rigidity .  Mental Status -  Level of arousal and orientation to time, place, and person were intact. Language including expression, naming, repetition, comprehension was assessed and found intact.  mini-mental status  exam  Orientation to time - 5/5 Orientation to place - 4/5 Registration - 3/3 Attention - 0/5 Delayed recall - 1/3 Naming - 2/2 Repetition - 1/1 Comprehension - 3/3 Reading - 1/1 Writing - 1/1 Visuospatial - 1/1  Total 22/30  MOCA  visuospatial - executive 2/5 Naming - 2/3 Memory -  Attention - 2/2, 1/1, 0/3 Language - 2/2, 0/1 Abstraction - 0/2 Delayed recall - 1/5 Orientation - 6/6  Total - 16/30  Cranial Nerves II -  XII - II - Visual field intact OU. III, IV, VI - Extraocular movements intact. V - Facial sensation intact bilaterally. VII - Facial movement intact bilaterally. VIII - Hearing & vestibular intact bilaterally. X - Palate elevates symmetrically. XI - Chin turning & shoulder shrug intact bilaterally. XII - Tongue protrusion intact.  Motor Strength - The patient's strength was normal in all extremities and pronator drift was absent.  Bulk was normal and fasciculations were absent.   Motor Tone - Muscle tone was assessed at the neck and appendages and was normal.  Reflexes - The patient's reflexes were normal in all extremities and she had no pathological reflexes.  Sensory - Light touch, temperature/pinprick were assessed and were normal.    Coordination - The patient had normal movements in the hands and feet with no ataxia or dysmetria.  Tremor was absent.  Gait and Station - The patient's transfers, posture, gait, station, and turns were observed as normal.   Imaging  I have personally reviewed the radiological images below and agree with the radiology interpretations.  CT head 08/26/15 No acute abnormalities  Lab Review Component     Latest Ref Rng 11/22/2014 11/23/2014  Cholesterol     0 - 200 mg/dL  113  Triglycerides     <150 mg/dL  78  HDL Cholesterol     >39 mg/dL  44  Total CHOL/HDL Ratio       2.6  VLDL     0 - 40 mg/dL  16  LDL (calc)     0 - 99 mg/dL  53  Hemoglobin A1C     <5.7 % 5.7 (H)   Mean Plasma Glucose     <117  mg/dL 117 (H)   TSH     0.350 - 4.500 uIU/mL 1.380   Free T4     0.80 - 1.80 ng/dL 0.87      Assessment and Plan:   In summary, Jacqueline Ball is a 74 y.o. female with PMH of HTN, DM, afib on eliquis presents as new pt for evaluation of cognitive impairment. Pt complains of cognitive impairment, difficulty concentration, memory difficulty after a fall in 07/2015. CT head x 2 no acute abnormalities. Diagnosed with post concussion syndrome. Has failed class recently. Denies memory problem before the fall but difficult to confirm at this time. Had MMSE and MOCA test showed moderate impairment, but most prominent at visuospatial, executive and memory domain. Will need further neuropsych eval. Pt does not have depression as my observation. Will finish off dementia work up. Recommend MRI head.    - B12, FA and RPR - neuropsych referral - MRI brian without contrast - follow up in 3 months to repeat cognitive testing  Thank you very much for the opportunity to participate in the care of this patient.  Please do not hesitate to call if any questions or concerns arise.  Orders Placed This Encounter  Procedures  . MR Brain Wo Contrast    Standing Status: Future     Number of Occurrences:      Standing Expiration Date: 01/03/2017    Order Specific Question:  Reason for Exam (SYMPTOM  OR DIAGNOSIS REQUIRED)    Answer:  cognitive impairment    Order Specific Question:  Preferred imaging location?    Answer:  Internal    Order Specific Question:  Does the patient have a pacemaker or implanted devices?    Answer:  No    Order Specific Question:  What is the patient's  sedation requirement?    Answer:  No Sedation  . Folate  . Vitamin B12  . RPR  . Ambulatory referral to Neuropsychology    Referral Priority:  Routine    Referral Type:  Psychiatric    Referral Reason:  Specialty Services Required    Requested Specialty:  Psychology    Number of Visits Requested:  1    Meds ordered this encounter    Medications  . vitamin E 400 UNIT capsule    Sig: Take by mouth.  . Mesalamine (ASACOL) 400 MG CPDR DR capsule    Sig: Take 800 mg by mouth.  [name] ibuprofen (ADVIL,MOTRIN) 400 MG tablet    Sig: Take 400 mg by mouth.  . valACYclovir (VALTREX) 500 MG tablet    Sig:   . Blood Glucose Monitoring Suppl (TRUE METRIX AIR GLUCOSE METER) W/DEVICE KIT    Sig:   . metFORMIN (GLUCOPHAGE) 500 MG tablet    Sig:   . TRUE METRIX BLOOD GLUCOSE TEST test strip    Sig:   . TRUEPLUS LANCETS 28G MISC    Sig:     Patient Instructions  - will do some blood draw today - will refer you to see neuropsychologist to do the psychology testing - repeat testing next visit and compare - follow up in 3 months.    Rosalin Hawking, MD PhD [facility] [address] [place], [place] [address] [phone]

## 2015-11-06 ENCOUNTER — Ambulatory Visit
Admission: RE | Admit: 2015-11-06 | Discharge: 2015-11-06 | Disposition: A | Discharge status: Home / Self Care | Payer: Medicare HMO | Source: Ambulatory Visit | Attending: Neurology | Admitting: Neurology

## 2015-11-06 DIAGNOSIS — R410 Disorientation, unspecified: Secondary | ICD-10-CM | POA: Diagnosis not present

## 2015-11-06 DIAGNOSIS — R4189 Other symptoms and signs involving cognitive functions and awareness: Secondary | ICD-10-CM

## 2015-11-08 ENCOUNTER — Telehealth: Payer: Self-pay

## 2015-11-08 NOTE — Telephone Encounter (Signed)
-----   Message from Jindong Xu, MD sent at 11/07/2015  3:58 PM EST ----- Could you please let the pt know that her MRI done yesterday was essential normal. Continue current treatment plan without change. Thanks.  Jindong Xu, MD PhD Stroke Neurology 11/07/2015 3:58 PM   

## 2015-11-08 NOTE — Telephone Encounter (Signed)
-----   Message from Marvel PlanJindong Xu, MD sent at 11/07/2015  3:58 PM EST ----- Could you please let the pt know that her MRI done yesterday was essential normal. Continue current treatment plan without change. Thanks.  Marvel PlanJindong Xu, MD PhD Stroke Neurology 11/07/2015 3:58 PM

## 2015-11-08 NOTE — Telephone Encounter (Signed)
Lft vm for patient to return cal about MRI results.

## 2015-11-08 NOTE — Telephone Encounter (Signed)
LFt vm for patient to return phone call about MRi results.

## 2015-11-08 NOTE — Telephone Encounter (Deleted)
Rn

## 2015-11-08 NOTE — Telephone Encounter (Signed)
Pt returned Katrina call

## 2015-11-08 NOTE — Telephone Encounter (Addendum)
Rn receive incoming call from patient. Rn notify her that the MRI was normal and to continue treatment plan. Pt verbalized understanding. Rn told patient to be at appt on 02-02-2015 and arrive 15 min early.

## 2015-12-27 NOTE — Progress Notes (Signed)
RN FAX form back to [employer] for diagnose code verification for labs perform for cognitive impairment.

## 2016-02-07 ENCOUNTER — Ambulatory Visit: Payer: Medicare PPO | Admitting: Neurology

## 2016-02-08 ENCOUNTER — Encounter: Payer: Self-pay | Admitting: Neurology

## 2016-03-21 ENCOUNTER — Ambulatory Visit: Payer: Medicare PPO | Admitting: Neurology

## 2016-03-22 ENCOUNTER — Encounter: Payer: Self-pay | Admitting: Neurology

## 2016-04-02 ENCOUNTER — Encounter: Payer: Self-pay | Admitting: Neurology

## 2016-04-02 ENCOUNTER — Ambulatory Visit (INDEPENDENT_AMBULATORY_CARE_PROVIDER_SITE_OTHER): Payer: Medicare PPO | Admitting: Neurology

## 2016-04-02 VITALS — BP 112/71 | HR 61 | Ht 64.0 in | Wt 133.4 lb

## 2016-04-02 DIAGNOSIS — I482 Chronic atrial fibrillation, unspecified: Secondary | ICD-10-CM

## 2016-04-02 DIAGNOSIS — I10 Essential (primary) hypertension: Secondary | ICD-10-CM

## 2016-04-02 DIAGNOSIS — E119 Type 2 diabetes mellitus without complications: Secondary | ICD-10-CM

## 2016-04-02 DIAGNOSIS — R4189 Other symptoms and signs involving cognitive functions and awareness: Secondary | ICD-10-CM | POA: Diagnosis not present

## 2016-04-02 MED ORDER — DONEPEZIL HCL 10 MG PO TABS: 10.0000 mg | ORAL_TABLET | Freq: Every day | ORAL | Status: DC

## 2016-04-02 MED ORDER — DONEPEZIL HCL 5 MG PO TABS: 5.0000 mg | ORAL_TABLET | Freq: Every day | ORAL | Status: DC

## 2016-04-02 NOTE — Progress Notes (Signed)
NEUROLOGY CLINIC NEW PATIENT NOTE  NAME: Jacqueline Ball DOB: [birth year] REFERRING PHYSICIAN: Rogers Blocker, MD  I saw Jacqueline Ball as a new consult in the neurovascular clinic today regarding  Chief Complaint  Patient presents with  . Follow-up    Cognitive impairment   .  HPI: Jacqueline Ball is a 75 y.o. female with PMH of HTN, DM and afib on eliquis who presents as a new patient for Cognitive impairment.   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 Complains of HA at that time. She went to ED and had CT head and neck done showed no acute abnormality and no cervical fracture.   She went back to ED again on 08/26/15 for ongoing dizziness and difficulty with concentrating since the fall. Repeat head CT no acute abnormality and was diagnosed with post concussion syndrome. Since then, she had ongoing difficulty with class, decreased memory, although admit that she noted having difficulty with class prior to fall. Eventually, she failed the class recently. She went to see her PCP and was referred here for further evaluation.   I tried to get collateral information from her husband in clinic today but he is a poor historian and can not provide further information. Pt stated that she seems in a reasonable intelligence prior to fall but I can not confirm it at this time.   She has hx of HTN and DM on metoprolol and metformin. She also has afib on amiodarone and eliquis and she has been following with Dr. Terrence Dupont. She is also on lipitor and cholestyramine. She is also on estrogen patch as well as plaquenil for arthritis.   She denies smoking, alcohol or illicit drugs.   Interval History During the interval time, pt has been doing the same. Has not back to class yet. Will start again this August. She left the class as recommended by her mentor last August. Her mentor would like her to see for evaluation  during the mean time. However, we referred her to see Dr. Valentina Shaggy for neuropsych testing but that was not done. MRI brain negative and B12, FA and RPR were all negative.   Past Medical History  Diagnosis Date  . Hypertension   . Diabetes mellitus   . Physical exam, annual 10/22/06  . Anxiety   . Hyperlipidemia   . Memory loss   . Atrial fibrillation ([place])   . Renal cyst   . Mood disorder [facility])    Past Surgical History  Procedure Laterality Date  . Abdominal hysterectomy     Family History  Problem Relation Age of Onset  . Diabetes Sister    Current Outpatient Prescriptions  Medication Sig Dispense Refill  . acetaminophen (TYLENOL) 500 MG tablet Take 500 mg by mouth every 6 (six) hours as needed for pain.    [name] amiodarone (PACERONE) 200 MG tablet Take 1 tablet (200 mg total) by mouth 2 (two) times daily. 60 tablet 3  . apixaban (ELIQUIS) 5 MG TABS tablet Take 1 tablet (5 mg total) by mouth 2 (two) times daily. 60 tablet 3  . atorvastatin (LIPITOR) 20 MG tablet Take 20 mg by mouth daily.     . Blood Glucose Monitoring Suppl (TRUE METRIX AIR GLUCOSE METER) W/DEVICE KIT     . cholestyramine (QUESTRAN) 4 GM/DOSE powder Take 4 g by mouth daily.     . clobetasol (TEMOVATE)  0.05 % external solution     . colchicine 0.6 MG tablet Take 0.6 mg by mouth daily.    [name] estradiol (CLIMARA) 0.06 MG/24HR Place 1 patch onto the skin every Tuesday.    . hydroxychloroquine (PLAQUENIL) 200 MG tablet Take 200 mg by mouth daily as needed (for arthritis).    . LORazepam (ATIVAN) 1 MG tablet Take 1 mg by mouth daily.     . Mesalamine (ASACOL) 400 MG CPDR DR capsule Take 800 mg by mouth.    . metFORMIN (GLUCOPHAGE) 500 MG tablet     . metoprolol tartrate (LOPRESSOR) 12.5 mg TABS tablet Take 0.5 tablets (12.5 mg total) by mouth 2 (two) times daily. 30 tablet 3  . potassium chloride SA (K-DUR,KLOR-CON) 20 MEQ tablet Take 20 mEq by mouth 2 (two) times daily. Patient takes 1/2 pil herself, she change how she  takes it    . PREMARIN vaginal cream     . Probiotic Product (ALIGN) 4 MG CAPS Take 1 capsule by mouth daily.     . sucralfate (CARAFATE) 1 GM/10ML suspension Take 10 mLs (1 g total) by mouth 4 (four) times daily -  with meals and at bedtime. 420 mL 0  . tolterodine (DETROL LA) 4 MG 24 hr capsule Take 1 capsule by mouth daily.    . TRUE METRIX BLOOD GLUCOSE TEST test strip     . TRUEPLUS LANCETS 28G MISC     . valACYclovir (VALTREX) 500 MG tablet     . VESICARE 10 MG tablet     . vitamin E 400 UNIT capsule Take by mouth.     No current facility-administered medications for this visit.   Allergies  Allergen Reactions  . Metoprolol Nausea And Vomiting   Social History   Social History  . Marital Status: Married    Spouse Name: Jacqueline Ball  . Number of Children: N/A  . Years of Education: N/A   Occupational History  . Not on file.   Social History Main Topics  . Smoking status: Never Smoker   . Smokeless tobacco: Not on file  . Alcohol Use: No  . Drug Use: No  . Sexual Activity: Not on file   Other Topics Concern  . Not on file   Social History Narrative    Review of Systems Full 14 system review of systems performed and notable only for those listed, all others are neg:  Constitutional:  Weight loss, fatigue Cardiovascular:  Ear/Nose/Throat:   Skin:  Eyes:  Blurry vision Respiratory:   Gastroitestinal:  Incontinence, diarrhea Genitourinary:  Hematology/Lymphatic:   Endocrine: feeling cold Musculoskeletal:   Allergy/Immunology:   Neurological:  Memory loss, dizziness Psychiatric: anxiety, decreased energy, change in appetite Sleep:    Physical Exam  Filed Vitals:   04/02/16 1143  BP: 112/71  Pulse: 61    General - Well nourished, well developed, in no apparent distress.  Ophthalmologic - fundi not visualized due to noncooperation.  Cardiovascular - irregularly irregular heart rate and rhythm.   Neck - supple, no nuchal rigidity .  Mental Status  -  Level of arousal and orientation to time, place, and person were intact. Language including expression, naming, repetition, comprehension was assessed and found intact.  [place]  visuospatial - executive 2/5 Naming - 3/3 Memory -  Attention - 2/2, 1/1, 0/3 Language - 2/2, 0/1 Abstraction - 0/2 Delayed recall - 1/5 Orientation - 6/6  Total - 17/30  Cranial Nerves II - XII - II - Visual field  intact OU. III, IV, VI - Extraocular movements intact. V - Facial sensation intact bilaterally. VII - Facial movement intact bilaterally. VIII - Hearing & vestibular intact bilaterally. X - Palate elevates symmetrically. XI - Chin turning & shoulder shrug intact bilaterally. XII - Tongue protrusion intact.  Motor Strength - The patient's strength was normal in all extremities and pronator drift was absent.  Bulk was normal and fasciculations were absent.   Motor Tone - Muscle tone was assessed at the neck and appendages and was normal.  Reflexes - The patient's reflexes were normal in all extremities and she had no pathological reflexes.  Sensory - Light touch, temperature/pinprick were assessed and were normal.    Coordination - The patient had normal movements in the hands and feet with no ataxia or dysmetria.  Tremor was absent.  Gait and Station - The patient's transfers, posture, gait, station, and turns were observed as normal.   Imaging  I have personally reviewed the radiological images below and agree with the radiology interpretations.  CT head 08/26/15 No acute abnormalities  MRI brain 11/07/15 This is a normal age-appropriate non-contrasted MRI of the brain. There is no significant atrophy or ischemic change. There are no acute findings.  Lab Review Component     Latest Ref Rng 11/22/2014 11/23/2014  Cholesterol     0 - 200 mg/dL  113  Triglycerides     <150 mg/dL  78  HDL Cholesterol     >39 mg/dL  44  Total CHOL/HDL Ratio       2.6  VLDL     0 - 40 mg/dL  16   LDL (calc)     0 - 99 mg/dL  53  Hemoglobin A1C     <5.7 % 5.7 (H)   Mean Plasma Glucose     <117 mg/dL 117 (H)   TSH     0.350 - 4.500 uIU/mL 1.380   Free T4     0.80 - 1.80 ng/dL 0.87    Component     Latest Ref Rng 10/31/2015  Folate     >3.0 ng/mL 8.9  Vitamin B12     211 - 946 pg/mL 401  RPR     Non Reactive Non Reactive     Assessment:   In summary, Jacqueline Ball is a 75 y.o. female with PMH of HTN, DM, afib on eliquis presents as new pt for evaluation of cognitive impairment. Pt complains of cognitive impairment, difficulty concentration, memory difficulty after a fall in 07/2015. CT head x 2 no acute abnormalities. Diagnosed with post concussion syndrome. Has failed class last August. Denies memory problem before the fall but difficult to confirm at this time. Had MMSE and MOCA test showed moderate impairment, but most prominent at visuospatial, executive, calculation, abstraction and memory domains. During the 5 month interval, there was essentially no change of MOCA test. Referred to neuropsych but not done yet, need to check the status. Pt does not have depression as my observation. Dementia work up so far negative including MRI, B12, A1C, FA, TSH or RPR. Will put on aricept. Pt likely not able to competent for her class.    Plan: - will check on neuropsych referral again - continue eliquis and lipitor for stroke prevention - will start aricept '5mg'$  for one month and then '10mg'$  after. - continue follow up with your PCP. - follow up in 6 months  I spent more than 25 minutes of face to face time with the patient.  Greater than 50% of time was spent in counseling and coordination of care. We completed MOCA testing, discussed about new medication and neuropsych referral and continued follow up.   No orders of the defined types were placed in this encounter.    Meds ordered this encounter  Medications  . PREMARIN vaginal cream    Sig:   . VESICARE 10 MG tablet    Sig:     . clobetasol (TEMOVATE) 0.05 % external solution    Sig:   . valACYclovir (VALTREX) 500 MG tablet    Sig:     There are no Patient Instructions on file for this visit.  Rosalin Hawking, MD PhD [facility] [address] [place],  [phone]

## 2016-04-02 NOTE — Patient Instructions (Addendum)
-   will check on neuropsych referral again - continue eliquis and lipitor for stroke prevention - will start aricept 5mg  for one month and then 10mg  after. - continue follow up with your PCP. - follow up in 6 months

## 2016-04-26 ENCOUNTER — Telehealth: Payer: Self-pay | Admitting: Neurology

## 2016-04-26 NOTE — Telephone Encounter (Signed)
Pt suppose to take aricept 10mg  daily now. If she can not tolerating 10mg  daily, can go back to 5mg  daily. If still not tolerating, will discontinue. Please let pt know. Thanks.  Marvel PlanJindong Denasia Venn, MD PhD Stroke Neurology 04/26/2016 6:59 PM

## 2016-04-26 NOTE — Telephone Encounter (Addendum)
Pt called requesting to r/s appt she missed on 03/21/16. Pt was advised she saw Dr Roda ShuttersXu on 04/02/16.  Pt said she was not tolerating donepezil (ARICEPT) 10 MG tablet . She is experiencing vomiting, "feels like I'm going to fall out". Pt also has donepezil (ARICEPT) 5 MG tablet . She seems to be confused somewhat on which one she is taking and possibly taking both.  Initial request came from answering service : Jannett CelestineCaller  Jacqueline Ball                 CID  [id number]   Patient  SAME                  Pt's Dr  Roda ShuttersXU            Area Code  336  Phone#  [phone] *  DOB  [birth year]      RE  MISSED HER 03/21/16 APPT/PLS CALL TO RESCHEDULE                                                           Disp:Y/N  Y  If Y = C/B If No Response In 20minutes

## 2016-04-27 NOTE — Telephone Encounter (Signed)
Rn call patient back about the dose of the aricept. Pt states she thinks she was taking 15 mg of aricept. Rn stated per Dr. Roda ShuttersXu med list, she is to take 5mg  of aricept from April 16 to May 16.Also she is to increase to 10mg  of aricept starting may 16. Rn stated if she was taking 15mg  that could cause her vomiting,and about to pass out. Rn stated per Dr. Roda ShuttersXu just take 10mg  of aricept. Pt verbalized understanding.

## 2016-05-30 ENCOUNTER — Ambulatory Visit: Payer: Medicare PPO | Attending: Psychology | Admitting: Psychology

## 2016-05-30 ENCOUNTER — Telehealth: Payer: Self-pay | Admitting: Psychology

## 2016-05-30 NOTE — Telephone Encounter (Signed)
Ms. Jacqueline Ball failed to show for her neuropsychology appointment today. I left her messages on her home phone and cell phone today requesting that she contact me.

## 2016-06-01 NOTE — Telephone Encounter (Signed)
Thank you, Dr. Leonides CaveZelson, for taking care of this pt. I call her today and was able to talk to her over her home phone. She stated that she forgot the appointment. She has a lot of stress lately, and did not have time to check her VMs. She has 49 messages now to be reviewed.   I gave her your office number [phone] for her to call to reschedule. She said she will do so. Thanks.  Marvel PlanJindong Odean Mcelwain, MD PhD Stroke Neurology 06/01/2016 4:31 PM

## 2016-08-01 ENCOUNTER — Other Ambulatory Visit: Payer: Self-pay | Admitting: Neurology

## 2016-08-01 DIAGNOSIS — R4189 Other symptoms and signs involving cognitive functions and awareness: Secondary | ICD-10-CM

## 2016-08-03 ENCOUNTER — Telehealth: Payer: Self-pay | Admitting: Neurology

## 2016-08-09 ENCOUNTER — Telehealth: Payer: Self-pay | Admitting: Neurology

## 2016-08-09 NOTE — Telephone Encounter (Signed)
Called pt and explained pt why she needs a neuropsychology testing. And she is OK with that and she is encouraged to do her best in those tests. She expressed understanding.   Her did not pick up her first number which is [phone] but was available on the home number [phone]. So please let the provider who do the neuropsych testing know to try both numbers to reach her. Thanks.   Do I need to re-order the test since Dr. Leonides CaveZelson is not available anymore? Thanks  Marvel PlanJindong Leylah Tarnow, MD PhD Stroke Neurology 08/09/2016 5:51 PM

## 2016-08-09 NOTE — Telephone Encounter (Signed)
MEssage sent to Dr. Roda ShuttersXu. Pt was a no show with Dr. Leonides CaveZelson at neuropsychologic testing.

## 2016-08-09 NOTE — Telephone Encounter (Signed)
Pt called in and says that she figured out she has a focus problem. She did not understand why she was being referred to neuropsychologist. She just wanted to let the physician know and would like to speak with him as soon as possible. 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  [phone]

## 2016-08-10 ENCOUNTER — Other Ambulatory Visit: Payer: Self-pay | Admitting: Neurology

## 2016-08-10 DIAGNOSIS — R4189 Other symptoms and signs involving cognitive functions and awareness: Secondary | ICD-10-CM

## 2016-08-14 ENCOUNTER — Telehealth: Payer: Self-pay | Admitting: Neurology

## 2016-08-14 NOTE — Telephone Encounter (Signed)
Called patient and left her message relaying we had to send her to Dr. Jacquelyne BalintMcdermott for I could not find a doctor in [place]. If any question's please call me back [phone].

## 2016-08-16 NOTE — Telephone Encounter (Signed)
Patient returned Dana's call, relayed message below.

## 2016-08-16 NOTE — Telephone Encounter (Signed)
Called Patient back left her a message asking her to return call at [phone].

## 2016-10-05 ENCOUNTER — Ambulatory Visit: Payer: Medicare PPO | Admitting: Neurology

## 2016-11-12 ENCOUNTER — Ambulatory Visit (INDEPENDENT_AMBULATORY_CARE_PROVIDER_SITE_OTHER): Payer: Medicare HMO

## 2016-11-12 ENCOUNTER — Ambulatory Visit (INDEPENDENT_AMBULATORY_CARE_PROVIDER_SITE_OTHER): Payer: Medicare HMO | Admitting: Orthopaedic Surgery

## 2016-11-12 ENCOUNTER — Encounter (INDEPENDENT_AMBULATORY_CARE_PROVIDER_SITE_OTHER): Payer: Self-pay | Admitting: Orthopaedic Surgery

## 2016-11-12 VITALS — BP 148/71 | HR 56 | Ht 64.0 in | Wt 140.0 lb

## 2016-11-12 DIAGNOSIS — M542 Cervicalgia: Secondary | ICD-10-CM | POA: Diagnosis not present

## 2016-11-12 DIAGNOSIS — M4726 Other spondylosis with radiculopathy, lumbar region: Secondary | ICD-10-CM | POA: Diagnosis not present

## 2016-11-12 DIAGNOSIS — M47812 Spondylosis without myelopathy or radiculopathy, cervical region: Secondary | ICD-10-CM

## 2016-11-12 DIAGNOSIS — G8929 Other chronic pain: Secondary | ICD-10-CM | POA: Diagnosis not present

## 2016-11-12 DIAGNOSIS — M545 Low back pain: Secondary | ICD-10-CM

## 2016-11-12 NOTE — Progress Notes (Signed)
Office Visit Note   Patient: Jacqueline Ball           Date of Birth: [birth year]           MRN: [id number] Visit Date: 11/12/2016              Requested by: Gwenyth BenderEric L Dean, MD [address] [place], [place] [id number] PCP: Gwenyth BenderEric L Dean, MD   Assessment & Plan: Visit Diagnoses:  1. Neck pain, acute   2. Chronic bilateral low back pain without sciatica   3. Cervicalgia   4. Spondylosis without myelopathy or radiculopathy, cervical region   5. Other spondylosis with radiculopathy, lumbar region     Plan: With patient's ongoing and worsening neck and low back pain we will schedule cervical and lumbar spine MRI scans rule out HNP/stenosis. She'll follow the office with Dr. Ophelia CharterYates after completion to discuss results and further treatment options.  Follow-Up Instructions: Return for Return after cervical and lumbar spine MRI scans to review with Dr. Ophelia CharterYates..   Orders:  Orders Placed This Encounter  Procedures  . XR Cervical Spine 2 or 3 views  . XR Lumbar Spine Complete  . MR Cervical Spine w/o contrast  . MR Lumbar Spine w/o contrast   No orders of the defined types were placed in this encounter.     Procedures: No procedures performed   Clinical Data: No additional findings.   Subjective: No chief complaint on file. Patient states that the pain has been off and on for several years. She's had previous CT service spine several years ago and has known cervical spondylosis. She's also had chronic back pain for several years and most recent lumbar spine x-ray report 2013 read chronic severe L5-S1 degenerative disc disease. Denies any upper or lower extremity radicular symptoms. Not having much by the way of neurogenic claudication. Neck pain radiates to the bilateral trapezius and scapular region. Some pain into her shoulders. Neck and back aggravated with increased activity. Patient also complaining today of abdominal spasms and has discussed this with her primary care physician  Dr. Willey BladeEric Dean today. States that he is recommended a referral to a gastroenterologist.  Pt here for Neck pain X 3 months, states radiates to lower back causes spasms in stomach which causes her to wake in the middle of neck. Has seen PCP today and symptoms were discussed and wanted to send to Delbert HarnessMurphy Wainer pt advised will see orthopedics today to discuss.    Review of Systems  Constitutional: Positive for activity change.  HENT: Negative.   Respiratory: Negative.   Cardiovascular: Negative.   Gastrointestinal:       Abdominal spasms  Musculoskeletal: Positive for back pain and neck pain.  Skin: Negative.   Psychiatric/Behavioral: Negative.       Objective: Vital Signs: BP (!) 148/71 (BP Location: Right Arm, Patient Position: Sitting)   Pulse (!) 56   Ht 5\' 4"  (1.626 m)   Wt 140 lb (63.5 kg)   BMI 24.03 kg/m   Physical Exam  Constitutional: She is oriented to person, place, and time. No distress.  HENT:  Head: Normocephalic.  Eyes: EOM are normal. Pupils are equal, round, and reactive to light.  Neck:  Has good range of motion but with some discomfort.  Pulmonary/Chest: Effort normal. No respiratory distress.  Abdominal: She exhibits no distension.  Musculoskeletal:  Gait is normal. She has some discomfort with lumbar extension. Positive lumbar paraspinal tenderness. Positive moderate to marked bilateral brachial plexus trapezius  and scapular tenderness. Bilateral shoulders good range of motion. Negative impingement test. Bilateral elbows unremarkable. Negative logroll bilateral hips. Negative straight leg raise. No focal motor deficits upper or lower extremity. Neurovascularly intact. Skin warm and dry.  Neurological: She is alert and oriented to person, place, and time.  Skin: Skin is warm and dry.    Ortho Exam  Specialty Comments:  No specialty comments available.  Imaging: Xr Lumbar Spine Complete  Result Date: 11/12/2016 Lumbar spine x-rays today show L4-5 L5-S1  facet changes. L5-S1 disc space collapse. Impression L5-S1 disc space collapse with facet arthropathy.  Xr Cervical Spine 2 Or 3 Views  Result Date: 11/12/2016 X-ray cervical spine show multilevel spondylosis with most in finding at C5-6 and C6-7 with disc space collapse intervertebral spurs. Couple millimeters of C5 retrolisthesis on C6. Impression multilevel cervical spondylosis with most noted in finding C5-6 and C6-7. Couple millimeters of C5 retrolisthesis.    PMFS History: Patient Active Problem List   Diagnosis Date Noted  . Chronic atrial fibrillation (HCC) 11/02/2015  . Essential hypertension 11/02/2015  . Type 2 diabetes mellitus without complication, without long-term current use of insulin (HCC) 11/02/2015  . Cognitive impairment 10/31/2015  . Chest pain 11/22/2014  . Muscle cramps 04/17/2012  . Bilateral shoulder pain 03/09/2012  . Essential hypertension 03/28/2011  . Diabetes mellitus type 2, noninsulin dependent (HCC) 03/28/2011  . 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 Anxiety   . Atrial fibrillation ([facility])   . Diabetes mellitus   . Hyperlipidemia   . Hypertension   . Memory loss   . Mood disorder ([facility])   . Physical exam, annual 10/22/06  . Renal cyst     Family History  Problem Relation Age of Onset  . Diabetes Sister     Past Surgical History:  Procedure Laterality Date  . ABDOMINAL HYSTERECTOMY     Social History   Occupational History  . Not on file.   Social History Main Topics  . Smoking status: Never Smoker  . Smokeless tobacco: Not on file  . Alcohol use No  . Drug use: No  . Sexual activity: Not on file

## 2016-11-27 ENCOUNTER — Ambulatory Visit (HOSPITAL_COMMUNITY): Payer: Medicare HMO | Attending: Surgery

## 2016-11-27 ENCOUNTER — Ambulatory Visit (HOSPITAL_COMMUNITY): Admission: RE | Admit: 2016-11-27 | Payer: Medicare HMO | Source: Ambulatory Visit

## 2016-12-06 ENCOUNTER — Ambulatory Visit (HOSPITAL_COMMUNITY): Admission: RE | Admit: 2016-12-06 | Payer: Medicare HMO | Source: Ambulatory Visit

## 2016-12-06 ENCOUNTER — Encounter (HOSPITAL_COMMUNITY)
Admission: RE | Admit: 2016-12-06 | Discharge: 2016-12-06 | Disposition: A | Discharge status: Home / Self Care | Payer: Medicare HMO | Source: Ambulatory Visit | Attending: Surgery | Admitting: Surgery

## 2016-12-06 DIAGNOSIS — M4726 Other spondylosis with radiculopathy, lumbar region: Secondary | ICD-10-CM | POA: Diagnosis not present

## 2016-12-06 DIAGNOSIS — M47812 Spondylosis without myelopathy or radiculopathy, cervical region: Secondary | ICD-10-CM | POA: Insufficient documentation

## 2017-01-21 ENCOUNTER — Ambulatory Visit (INDEPENDENT_AMBULATORY_CARE_PROVIDER_SITE_OTHER): Payer: Medicare HMO | Admitting: Neurology

## 2017-01-21 ENCOUNTER — Other Ambulatory Visit: Payer: Self-pay | Admitting: Neurology

## 2017-01-21 ENCOUNTER — Encounter: Payer: Self-pay | Admitting: Neurology

## 2017-01-21 VITALS — BP 110/64 | HR 58 | Wt 137.4 lb

## 2017-01-21 DIAGNOSIS — R4189 Other symptoms and signs involving cognitive functions and awareness: Secondary | ICD-10-CM

## 2017-01-21 DIAGNOSIS — I10 Essential (primary) hypertension: Secondary | ICD-10-CM | POA: Diagnosis not present

## 2017-01-21 DIAGNOSIS — F0280 Dementia in other diseases classified elsewhere without behavioral disturbance: Secondary | ICD-10-CM | POA: Diagnosis not present

## 2017-01-21 DIAGNOSIS — I482 Chronic atrial fibrillation, unspecified: Secondary | ICD-10-CM

## 2017-01-21 DIAGNOSIS — G301 Alzheimer's disease with late onset: Secondary | ICD-10-CM | POA: Diagnosis not present

## 2017-01-21 MED ORDER — DONEPEZIL HCL 5 MG PO TABS: 5.0000 mg | ORAL_TABLET | Freq: Every day | ORAL | 0 refills | Status: DC

## 2017-01-21 MED ORDER — DONEPEZIL HCL 10 MG PO TABS: 10.0000 mg | ORAL_TABLET | Freq: Every day | ORAL | 5 refills | Status: DC

## 2017-01-21 NOTE — Progress Notes (Signed)
NEUROLOGY CLINIC NEW PATIENT NOTE  NAME: Jacqueline Ball DOB: [birth year] REFERRING PHYSICIAN: Rogers Blocker, MD  I saw Jacqueline Ball as a new consult in the neurovascular clinic today regarding  Chief Complaint  Patient presents with  . Follow-up    Cognitive Impairement Issues, pt was unsure of what medications she was taking. Pt did not review medication list prior to nurse seeing her.  [name]  HPI: Jacqueline Ball is a 76 y.o. female with PMH of HTN, DM and afib on eliquis who presents as a new patient for Cognitive impairment.   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 Complains of HA at that time. She went to ED and had CT head and neck done showed no acute abnormality and no cervical fracture.   She went back to ED again on 08/26/15 for ongoing dizziness and difficulty with concentrating since the fall. Repeat head CT no acute abnormality and was diagnosed with post concussion syndrome. Since then, she had ongoing difficulty with class, decreased memory, although admit that she noted having difficulty with class prior to fall. Eventually, she failed the class recently. She went to see her PCP and was referred here for further evaluation.   I tried to get collateral information from her husband in clinic today but he is a poor historian and can not provide further information. Pt stated that she seems in a reasonable intelligence prior to fall but I can not confirm it at this time.   She has hx of HTN and DM on metoprolol and metformin. She also has afib on amiodarone and eliquis and she has been following with Dr. Terrence Dupont. She is also on lipitor and cholestyramine. She is also on estrogen patch as well as plaquenil for arthritis.   She denies smoking, alcohol or illicit drugs.   04/02/16 follow up - pt has been doing the same. Has not back to class yet. Will start again this August. She left the class  as recommended by her mentor last August. Her mentor would like her to see for evaluation during the mean time. However, we referred her to see Dr. Valentina Shaggy for neuropsych testing but that was not done. MRI brain negative and B12, FA and RPR were all negative.  Interval History During the interval time,  Pt has been doing the same. 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 She has been working in [employer], [employer] for 20 years. 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 Recommend continue aricept and add namenda in the future. However, she stated that her aricept has been out and not able to get refilled. BP 110/64.   Past Medical History:  Diagnosis Date  . Anxiety   . Atrial fibrillation ([place])   . Diabetes mellitus   . Hyperlipidemia   . Hypertension   . Memory loss   . Mood disorder ([place])   . Physical exam, annual 10/22/06  . Renal cyst    Past Surgical History:  Procedure Laterality Date  . ABDOMINAL HYSTERECTOMY     Family History  Problem Relation Age of Onset  . Diabetes Sister    Current Outpatient Prescriptions  Medication Sig Dispense Refill  . acetaminophen (TYLENOL) 500 MG tablet Take 500 mg by mouth every 6 (six) hours  as needed for pain.    [name] AFLURIA PRESERVATIVE FREE 0.5 ML SUSY inject 0.5 milliliter intramuscularly  0  . amiodarone (PACERONE) 200 MG tablet Take 1 tablet (200 mg total) by mouth 2 (two) times daily. 60 tablet 3  . apixaban (ELIQUIS) 5 MG TABS tablet Take 1 tablet (5 mg total) by mouth 2 (two) times daily. 60 tablet 3  . atorvastatin (LIPITOR) 20 MG tablet Take 20 mg by mouth daily.     . Blood Glucose Monitoring Suppl (TRUE METRIX AIR GLUCOSE METER) W/DEVICE KIT     . cholestyramine (QUESTRAN) 4 GM/DOSE powder Take 4 g by mouth daily.     . clobetasol (TEMOVATE) 0.05 % external solution     . colchicine 0.6 MG tablet Take 0.6 mg by  mouth daily.    [name] estradiol (CLIMARA) 0.06 MG/24HR Place 1 patch onto the skin every Tuesday.    [name] LORazepam (ATIVAN) 1 MG tablet Take 1 mg by mouth daily.     . Mesalamine (ASACOL) 400 MG CPDR DR capsule Take 800 mg by mouth.    . metFORMIN (GLUCOPHAGE) 500 MG tablet     . PREMARIN vaginal cream     . TRUE METRIX BLOOD GLUCOSE TEST test strip     . TRUEPLUS LANCETS 28G MISC     . [START ON 02/18/2017] donepezil (ARICEPT) 10 MG tablet Take 1 tablet (10 mg total) by mouth at bedtime. 30 tablet 5  . donepezil (ARICEPT) 5 MG tablet Take 1 tablet (5 mg total) by mouth at bedtime. 30 tablet 0   No current facility-administered medications for this visit.    Allergies  Allergen Reactions  . Metoprolol Nausea And Vomiting   Social History   Social History  . Marital status: Married    Spouse name: EDDIE Stradley  . Number of children: N/A  . Years of education: N/A   Occupational History  . Not on file.   Social History Main Topics  . Smoking status: Never Smoker  . Smokeless tobacco: Never Used  . Alcohol use No  . Drug use: No  . Sexual activity: Not on file   Other Topics Concern  . Not on file   Social History Narrative  . No narrative on file    Review of Systems Full 14 system review of systems performed and notable only for those listed, all others are neg:  Constitutional:   Cardiovascular: chest tightness Ear/Nose/Throat:   Skin:  Eyes:  Blurry vision Respiratory:   Gastroitestinal:  Black stool Genitourinary:  Hematology/Lymphatic:   Endocrine:  Musculoskeletal:   Allergy/Immunology:   Neurological:  Facial droop Psychiatric:  Sleep:    Physical Exam  Vitals:   01/21/17 1424  BP: 110/64  Pulse: (!) 58    General - Well nourished, well developed, in no apparent distress.  Ophthalmologic - fundi not visualized due to noncooperation.  Cardiovascular - irregularly irregular heart rate and rhythm.   Neck - supple, no nuchal rigidity .  Mental  Status -  Level of arousal and orientation to time, place, and person were intact. Language including expression, naming, repetition, comprehension was assessed and found intact.  Cranial Nerves II - XII - II - Visual field intact OU. III, IV, VI - Extraocular movements intact. V - Facial sensation intact bilaterally. VII - Facial movement intact bilaterally. VIII - Hearing & vestibular intact bilaterally. X - Palate elevates symmetrically. XI - Chin turning & shoulder shrug intact bilaterally. XII - Tongue protrusion intact.  Motor Strength - The patient's strength was normal in all extremities and pronator drift was absent.  Bulk was normal and fasciculations were absent.   Motor Tone - Muscle tone was assessed at the neck and appendages and was normal.  Reflexes - The patient's reflexes were normal in all extremities and she had no pathological reflexes.  Sensory - Light touch, temperature/pinprick were assessed and were normal.    Coordination - The patient had normal movements in the hands and feet with no ataxia or dysmetria.  Tremor was absent.  Gait and Station - The patient's transfers, posture, gait, station, and turns were observed as normal.   Imaging  I have personally reviewed the radiological images below and agree with the radiology interpretations.  CT head 08/26/15 No acute abnormalities  MRI brain 11/07/15 This is a normal age-appropriate non-contrasted MRI of the brain. There is no significant atrophy or ischemic change. There are no acute findings.  Neuropsychology evaluation 09/13/16 - Major Neurocognitive Disorder (i.e., dementia), possible Alzheimer's disease    Lab Review Component     Latest Ref Rng 11/22/2014 11/23/2014  Cholesterol     0 - 200 mg/dL  113  Triglycerides     <150 mg/dL  78  HDL Cholesterol     >39 mg/dL  44  Total CHOL/HDL Ratio       2.6  VLDL     0 - 40 mg/dL  16  LDL (calc)     0 - 99 mg/dL  53  Hemoglobin A1C     <5.7  % 5.7 (H)   Mean Plasma Glucose     <117 mg/dL 117 (H)   TSH     0.350 - 4.500 uIU/mL 1.380   Free T4     0.80 - 1.80 ng/dL 0.87    Component     Latest Ref Rng 10/31/2015  Folate     >3.0 ng/mL 8.9  Vitamin B12     211 - 946 pg/mL 401  RPR     Non Reactive Non Reactive     Assessment:   In summary, Jacqueline Ball is a 76 y.o. female with PMH of HTN, DM, afib on eliquis presents as new pt for evaluation of cognitive impairment. Pt complains of cognitive impairment, difficulty concentration, memory difficulty after a fall in 07/2015. CT head x 2 no acute abnormalities. Diagnosed with post concussion syndrome. Has failed class last August. Denies memory problem before the fall but difficult to confirm at this time. Had MMSE and MOCA test showed moderate impairment, but most prominent at visuospatial, executive, calculation, abstraction and memory domains. During the 5 month interval, there was essentially no change of MOCA test. Pt does not have depression as my observation. Dementia work up so far negative including MRI, B12, A1C, FA, TSH or RPR. Was put on aricept but did not refill. 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 Will again put on aricept and later namenda.     Plan: - continue eliquis and lipitor for stroke prevention. - will start aricept 68m for one month and then 12mafter. - after 4 months, if you tolerating aricept well, we will start another medication called nemanda.  - continue follow up with your PCP. - follow up in 4 months with me.  I spent more than 25 minutes of face to face time with the patient. Greater than 50% of time was spent in counseling and coordination of care. We reviewed neuropsych testing result, medication regimen and continued follow up.  No orders of the defined types were placed in this encounter.   Meds ordered this encounter  Medications  . AFLURIA PRESERVATIVE FREE 0.5 ML SUSY    Sig: inject 0.5 milliliter intramuscularly    Refill:  0    . donepezil (ARICEPT) 5 MG tablet    Sig: Take 1 tablet (5 mg total) by mouth at bedtime.    Dispense:  30 tablet    Refill:  0  . donepezil (ARICEPT) 10 MG tablet    Sig: Take 1 tablet (10 mg total) by mouth at bedtime.    Dispense:  30 tablet    Refill:  5    Patient Instructions  - continue eliquis and lipitor for stroke prevention. - will start aricept 22m for one month and then 154mafter. - after 4 months, if you tolerating aricept well, we will start another medication called nemanda.  - continue follow up with your PCP. - follow up in 4 months with me.   JiRosalin HawkingMD PhD [facility] [id number]SuC[address] HeadrNew [place] [phone]

## 2017-01-21 NOTE — Patient Instructions (Addendum)
-   continue eliquis and lipitor for stroke prevention. - will start aricept 5mg  for one month and then 10mg  after. - after 4 months, if you tolerating aricept well, we will start another medication called nemanda.  - continue follow up with your PCP. - follow up in 4 months with me.

## 2017-01-22 ENCOUNTER — Other Ambulatory Visit: Payer: Self-pay

## 2017-01-22 DIAGNOSIS — R4189 Other symptoms and signs involving cognitive functions and awareness: Secondary | ICD-10-CM

## 2017-01-22 MED ORDER — DONEPEZIL HCL 5 MG PO TABS: 5.0000 mg | ORAL_TABLET | Freq: Every day | ORAL | 2 refills | Status: DC

## 2017-01-25 ENCOUNTER — Emergency Department (HOSPITAL_COMMUNITY): Payer: Medicare HMO

## 2017-01-25 ENCOUNTER — Encounter (HOSPITAL_COMMUNITY): Payer: Self-pay | Admitting: *Deleted

## 2017-01-25 ENCOUNTER — Emergency Department (HOSPITAL_COMMUNITY)
Admission: EM | Admit: 2017-01-25 | Discharge: 2017-01-26 | Disposition: A | Discharge status: Home / Self Care | Payer: Medicare HMO | Attending: Emergency Medicine | Admitting: Emergency Medicine

## 2017-01-25 DIAGNOSIS — G309 Alzheimer's disease, unspecified: Secondary | ICD-10-CM | POA: Insufficient documentation

## 2017-01-25 DIAGNOSIS — Z79899 Other long term (current) drug therapy: Secondary | ICD-10-CM | POA: Insufficient documentation

## 2017-01-25 DIAGNOSIS — I10 Essential (primary) hypertension: Secondary | ICD-10-CM | POA: Insufficient documentation

## 2017-01-25 DIAGNOSIS — K5900 Constipation, unspecified: Secondary | ICD-10-CM

## 2017-01-25 DIAGNOSIS — Z7984 Long term (current) use of oral hypoglycemic drugs: Secondary | ICD-10-CM | POA: Diagnosis not present

## 2017-01-25 DIAGNOSIS — R0789 Other chest pain: Secondary | ICD-10-CM

## 2017-01-25 DIAGNOSIS — E119 Type 2 diabetes mellitus without complications: Secondary | ICD-10-CM | POA: Diagnosis not present

## 2017-01-25 DIAGNOSIS — R079 Chest pain, unspecified: Secondary | ICD-10-CM | POA: Diagnosis present

## 2017-01-25 LAB — URINALYSIS, ROUTINE W REFLEX MICROSCOPIC
Bilirubin Urine: NEGATIVE
GLUCOSE, UA: NEGATIVE mg/dL
HGB URINE DIPSTICK: NEGATIVE
Ketones, ur: NEGATIVE mg/dL
LEUKOCYTES UA: NEGATIVE
Nitrite: NEGATIVE
PH: 5 (ref 5.0–8.0)
Protein, ur: NEGATIVE mg/dL
SPECIFIC GRAVITY, URINE: 1.014 (ref 1.005–1.030)

## 2017-01-25 LAB — COMPREHENSIVE METABOLIC PANEL
ALT: 19 U/L (ref 14–54)
AST: 21 U/L (ref 15–41)
Albumin: 4 g/dL (ref 3.5–5.0)
Alkaline Phosphatase: 76 U/L (ref 38–126)
Anion gap: 8 (ref 5–15)
BUN: 7 mg/dL (ref 6–20)
CHLORIDE: 107 mmol/L (ref 101–111)
CO2: 27 mmol/L (ref 22–32)
CREATININE: 0.96 mg/dL (ref 0.44–1.00)
Calcium: 9.5 mg/dL (ref 8.9–10.3)
GFR calc non Af Amer: 57 mL/min — ABNORMAL LOW (ref >60)
Glucose, Bld: 108 mg/dL — ABNORMAL HIGH (ref 65–99)
POTASSIUM: 4 mmol/L (ref 3.5–5.1)
SODIUM: 142 mmol/L (ref 135–145)
Total Bilirubin: 0.6 mg/dL (ref 0.3–1.2)
Total Protein: 6.8 g/dL (ref 6.5–8.1)

## 2017-01-25 LAB — POC OCCULT BLOOD, ED: FECAL OCCULT BLD: NEGATIVE

## 2017-01-25 LAB — I-STAT TROPONIN, ED: Troponin i, poc: 0 ng/mL (ref 0.00–0.08)

## 2017-01-25 LAB — CBC
HEMATOCRIT: 39.9 % (ref 36.0–46.0)
Hemoglobin: 13.3 g/dL (ref 12.0–15.0)
MCH: 32.8 pg (ref 26.0–34.0)
MCHC: 33.3 g/dL (ref 30.0–36.0)
MCV: 98.5 fL (ref 78.0–100.0)
PLATELETS: 315 10*3/uL (ref 150–400)
RBC: 4.05 MIL/uL (ref 3.87–5.11)
RDW: 12.9 % (ref 11.5–15.5)
WBC: 6.7 10*3/uL (ref 4.0–10.5)

## 2017-01-25 LAB — LIPASE, BLOOD: Lipase: 19 U/L (ref 11–51)

## 2017-01-25 NOTE — ED Triage Notes (Signed)
No answer when called to get vitals

## 2017-01-25 NOTE — ED Triage Notes (Signed)
The pt has had a dark colored or black stool today she reports that she  Had not had a bm for 2 months??? Some abd pain

## 2017-01-25 NOTE — ED Provider Notes (Signed)
[facility] DEPT Provider Note   CSN: [id number] Arrival date & time: 01/25/17  1516     History   Chief Complaint Chief Complaint  Patient presents with  . Abdominal Pain    HPI Jacqueline Ball is a 76 y.o. female. Patient has history as below, on Eliquis for A. fib, who presents with left-sided chest pain. Her pain is poorly characterized. History is limited due to her being a poor historian. She reports that her pain started yesterday and feels like gas pain. It goes from her left chest down the left side of her body including her left leg all the way to her toes. She also has pain in her left neck, left arm, and left jaw. She reports her pain is been waxing and waning. It does not seem to be worse with exertion. She has had no shortness of breath. She does report 2 months of constipation. She states she had a bowel movement earlier today which was black. She has no history of GI bleeding. She denies any abdominal pain, dysuria, vaginal discharge, or abnormal bleeding.  HPI  Past Medical History:  Diagnosis Date  . Anxiety   . Atrial fibrillation ([place])   . Diabetes mellitus   . Hyperlipidemia   . Hypertension   . Memory loss   . Mood disorder ([place])   . Physical exam, annual 10/22/06  . Renal cyst     Patient Active Problem List   Diagnosis Date Noted  . Late onset Alzheimer's disease without behavioral disturbance 01/21/2017  . Chronic atrial fibrillation ([place]) 11/02/2015  . Essential hypertension 11/02/2015  . Type 2 diabetes mellitus without complication, without long-term current use of insulin ([place]) 11/02/2015  . Cognitive impairment 10/31/2015  . Chest pain 11/22/2014  . Muscle cramps 04/17/2012  . Bilateral shoulder pain 03/09/2012  . Essential hypertension 03/28/2011  . Diabetes mellitus type 2, noninsulin dependent ([place]) 03/28/2011  . 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 ABDOMINAL HYSTERECTOMY       OB History    No data available       Home Medications    Prior to Admission medications   Medication Sig Start Date End Date Taking? Authorizing Provider  acetaminophen (TYLENOL) 500 MG tablet Take 500 mg by mouth every 6 (six) hours as needed for pain.   Yes Historical Provider, MD  amiodarone (PACERONE) 200 MG tablet Take 1 tablet (200 mg total) by mouth 2 (two) times daily. Patient taking differently: Take 100 mg by mouth at bedtime.  11/24/14  Yes Charolette Forward, MD  apixaban (ELIQUIS) 5 MG TABS tablet Take 1 tablet (5 mg total) by mouth 2 (two) times daily. 11/24/14  Yes Charolette Forward, MD  atorvastatin (LIPITOR) 40 MG tablet Take 40 mg by mouth at bedtime. 01/18/17  Yes Historical Provider, MD  Calcium Carbonate-Vitamin D (CALTRATE 600+D PO) Take 1 tablet by mouth daily.   Yes Historical Provider, MD  cholecalciferol (VITAMIN D) 1000 units tablet Take 1,000 Units by mouth daily.   Yes Historical Provider, MD  clobetasol (TEMOVATE) 0.05 % external solution Apply 1 application topically daily as needed (itchy scalp).  03/09/16  Yes Historical Provider, MD  conjugated estrogens (PREMARIN) vaginal cream Place 1 Applicatorful vaginally once a week.   Yes Historical Provider, MD  diphenhydramine-acetaminophen (TYLENOL PM) 25-500 MG TABS tablet Take 1 tablet by mouth at bedtime as needed (sleep).   Yes Historical Provider, MD  donepezil (ARICEPT) 5 MG tablet  Take 1 tablet (5 mg total) by mouth at bedtime. 01/22/17  Yes Rosalin Hawking, MD  estradiol (CLIMARA) 0.06 MG/24HR Place 1 patch onto the skin every Thursday.   Yes Historical Provider, MD  LORazepam (ATIVAN) 1 MG tablet Take 1 mg by mouth at bedtime.  05/27/12  Yes Historical Provider, MD  metFORMIN (GLUCOPHAGE) 500 MG tablet Take 500 mg by mouth daily after supper.  08/23/15  Yes Historical Provider, MD  Polyethyl Glycol-Propyl Glycol (SYSTANE OP) Place 1 drop into both eyes daily as needed (dry eyes).   Yes Historical Provider, MD  VITAMIN E PO  Take 1 capsule by mouth daily.   Yes Historical Provider, MD  Blood Glucose Monitoring Suppl (TRUE METRIX AIR GLUCOSE METER) W/DEVICE KIT  08/23/15   Historical Provider, MD  TRUE METRIX BLOOD GLUCOSE TEST test strip  08/23/15   Historical Provider, MD  TRUEPLUS LANCETS 28G [place]  08/23/15   Historical Provider, MD    Family History Family History  Problem Relation Age of Onset  . Diabetes Sister     Social History Social History  Substance Use Topics  . Smoking status: Never Smoker  . Smokeless tobacco: Never Used  . Alcohol use No     Allergies   Metoprolol   Review of Systems Review of Systems  Constitutional: Negative for chills and fever.  HENT: Negative for ear pain and sore throat.   Eyes: Negative for pain and visual disturbance.  Respiratory: Negative for cough and shortness of breath.   Cardiovascular: Positive for chest pain. Negative for palpitations.  Gastrointestinal: Negative for abdominal pain and vomiting.  Genitourinary: Negative for dysuria and hematuria.  Musculoskeletal: Positive for myalgias and neck pain. Negative for arthralgias and back pain.  Skin: Negative for color change and rash.  Neurological: Negative for seizures and syncope.  All other systems reviewed and are negative.    Physical Exam Updated Vital Signs BP 160/80   Pulse 60   Temp 97.9 F (36.6 C) (Oral)   Resp 18   Ht 5' 4.5" (1.638 m)   Wt 60.8 kg   SpO2 100%   BMI 22.65 kg/m   Physical Exam  Constitutional: She is oriented to person, place, and time. She appears well-developed and well-nourished. No distress.  HENT:  Head: Normocephalic and atraumatic.  Eyes: Conjunctivae and EOM are normal. Pupils are equal, round, and reactive to light.  Neck: Neck supple.  Cardiovascular: Normal rate and regular rhythm.   Murmur (II/VI SEJM) heard. Pulmonary/Chest: Effort normal and breath sounds normal. No respiratory distress. She has no wheezes. She has no rales. She exhibits  tenderness (mild tenderness to palpation of L chest wall).  Abdominal: Soft. She exhibits no distension and no mass. There is no tenderness. There is no guarding.  Musculoskeletal: She exhibits no edema.  Neurological: She is alert and oriented to person, place, and time. No cranial nerve deficit. Coordination normal.  Pt has 5/5 strength in all extremities. Sensation to light touch intact throughout. No CN deficit.  Skin: Skin is warm and dry.  Psychiatric: She has a normal mood and affect.  Nursing note and vitals reviewed.    ED Treatments / Results  Labs (all labs ordered are listed, but only abnormal results are displayed) Labs Reviewed  COMPREHENSIVE METABOLIC PANEL - Abnormal; Notable for the following:       Result Value   Glucose, Bld 108 (*)    GFR calc non Af Amer 57 (*)    All other components  within normal limits  CBC  URINALYSIS, ROUTINE W REFLEX MICROSCOPIC  LIPASE, BLOOD  I-STAT TROPOININ, ED  POC OCCULT BLOOD, ED    EKG  EKG Interpretation  Date/Time:  Friday January 25 2017 21:46:01 EST Ventricular Rate:  64 PR Interval:    QRS Duration: 82 QT Interval:  417 QTC Calculation: 431 R Axis:   24 Text Interpretation:  Sinus rhythm Prolonged PR interval Left atrial enlargement Borderline T abnormalities, anterior leads No significant change since last tracing Confirmed by YAO  MD, DAVID ([id number]) on 01/25/2017 10:24:27 PM      She walks she doesn't Radiology Dg Abd Acute W/chest  Result Date: 01/25/2017 CLINICAL DATA:  Left-sided chest pain and left abdominal pain. Left shoulder and left back pain. Pain started a few days ago. First bowel movement in 2 months today. History of hypertension and diabetes. EXAM: DG ABDOMEN ACUTE W/ 1V CHEST COMPARISON:  Chest 04/02/2015.  CT abdomen and pelvis 03/10/2015. FINDINGS: Normal heart size and pulmonary vascularity. No focal airspace disease or consolidation in the lungs. No blunting of costophrenic angles. No pneumothorax.  Mediastinal contours appear intact. Scattered gas and stool throughout the colon. No small or large bowel distention. No free intra- abdominal air. No abnormal air-fluid levels. No radiopaque stones. Degenerative changes in the spine and hips. IMPRESSION: No evidence of active pulmonary disease. Nonobstructive bowel gas pattern. Electronically Signed   By: Lucienne Capers M.D.   On: 01/25/2017 21:47    Procedures Procedures (including critical care time)  Medications Ordered in ED Medications - No data to display   Initial Impression / Assessment and Plan / ED Course  I have reviewed the triage vital signs and the nursing notes.  Pertinent labs & imaging results that were available during my care of the patient were reviewed by me and considered in my medical decision making (see chart for details).    Patient is a 76 year old female with history as above who presents with vague complaints of left-sided chest pain, left leg pain, and left arm pain since yesterday. Her pain is intermittent. See history of present illness for full details. She also reports that she has had constipation for 2 months. She reported a dark bowel movement earlier today.  Here, she does have left-sided chest wall tenderness. Stool Hemoccult is negative. EKG non-concerning for ischemic changes. Her troponin was negative. This was obtained about 6 hours after she was in the ED, so no repeat indicated. Patient's labs including urinalysis are otherwise unremarkable. Chest and abdominal x-ray shows no acute cardiopulmonary disease. No evidence of obstruction or significant stool burden. Patient has undifferentiated left chest wall pain. Her neuro exam is intact. She likely has musculoskeletal chest pain, however I discussed return precautions with her in detail. Plan is to treat pain with ibuprofen. Close PCP follow-up advised. Patient discharged in stable condition.  Final Clinical Impressions(s) / ED Diagnoses   Final  diagnoses:  Intermittent left-sided chest pain  Constipation, unspecified constipation type    New Prescriptions New Prescriptions   No medications on file     Clifton James, MD 01/25/17 2315    Drenda Freeze, MD 01/28/17 [phone]

## 2017-02-01 ENCOUNTER — Other Ambulatory Visit: Payer: Self-pay | Admitting: Cardiology

## 2017-02-01 DIAGNOSIS — R079 Chest pain, unspecified: Secondary | ICD-10-CM

## 2017-02-15 ENCOUNTER — Encounter (HOSPITAL_COMMUNITY): Admission: RE | Admit: 2017-02-15 | Payer: Medicare HMO | Source: Ambulatory Visit

## 2017-03-06 ENCOUNTER — Other Ambulatory Visit: Payer: Self-pay | Admitting: Neurology

## 2017-03-06 ENCOUNTER — Encounter (HOSPITAL_COMMUNITY)
Admission: RE | Admit: 2017-03-06 | Discharge: 2017-03-06 | Disposition: A | Discharge status: Home / Self Care | Payer: Medicare HMO | Source: Ambulatory Visit | Attending: Cardiology | Admitting: Cardiology

## 2017-03-06 ENCOUNTER — Other Ambulatory Visit: Payer: Self-pay

## 2017-03-06 DIAGNOSIS — R079 Chest pain, unspecified: Secondary | ICD-10-CM | POA: Insufficient documentation

## 2017-03-06 DIAGNOSIS — R4189 Other symptoms and signs involving cognitive functions and awareness: Secondary | ICD-10-CM

## 2017-03-06 MED ORDER — TECHNETIUM TC 99M TETROFOSMIN IV KIT
10.0000 | PACK | Freq: Once | INTRAVENOUS | Status: AC | PRN
  Administered 2017-03-06: 10 via INTRAVENOUS

## 2017-03-06 MED ORDER — TECHNETIUM TC 99M TETROFOSMIN IV KIT
30.0000 | PACK | Freq: Once | INTRAVENOUS | Status: AC | PRN
  Administered 2017-03-06: 30 via INTRAVENOUS

## 2017-03-06 MED ORDER — REGADENOSON 0.4 MG/5ML IV SOLN
0.4000 mg | Freq: Once | INTRAVENOUS | Status: AC
  Administered 2017-03-06: 0.4 mg via INTRAVENOUS

## 2017-03-06 MED ORDER — DONEPEZIL HCL 5 MG PO TABS: 5.0000 mg | ORAL_TABLET | Freq: Every day | ORAL | 2 refills | Status: DC

## 2017-03-06 MED ORDER — REGADENOSON 0.4 MG/5ML IV SOLN
INTRAVENOUS | Status: AC
  Filled 2017-03-06: qty 5

## 2017-03-06 NOTE — Telephone Encounter (Signed)
Noted. Thanks, Katrina.  Marvel PlanJindong Tallon Gertz, MD PhD Stroke Neurology 03/06/2017 6:00 PM

## 2017-03-06 NOTE — Telephone Encounter (Addendum)
Rn call patient that her aricept was sent to [facility] her preferred pharmacy. Pt stated the medication is too expensive so she wants it sent to [facility] on [address]. Rn gave pt the directions on how to take the medication. Pt has memory issues, and stated she understood. Dr. Roda ShuttersXu is aware of patients memory issues and noncompliance on the aricept. Rn stated she is almost out of meds. Rn stated the original rx was sent to [facility]. Pt never call when she change pharmacies new one is [facility] on [address]. RN notified Dr. Roda ShuttersXu of this.

## 2017-03-06 NOTE — Telephone Encounter (Signed)
Re: pt request for refill she wants it sent to [facility]

## 2017-03-06 NOTE — Telephone Encounter (Signed)
Pt is out of donepezil (ARICEPT) 5 MG tablet, she states , can be reached on her cell

## 2017-03-07 NOTE — Telephone Encounter (Signed)
See notes

## 2017-06-06 ENCOUNTER — Telehealth: Payer: Self-pay | Admitting: Neurology

## 2017-06-06 MED ORDER — DONEPEZIL HCL 10 MG PO TABS: 10.0000 mg | ORAL_TABLET | Freq: Every day | ORAL | 3 refills | Status: DC

## 2017-06-06 NOTE — Telephone Encounter (Signed)
Patient called office requesting refill for donepezil (ARICEPT) 5 MG tablet.  Per patient she states she is suppose to be taking 10mg  per Dr. Roda ShuttersXu she has only been taking the 5mg  due to prescription being incorrect.  [facility] [address]

## 2017-06-06 NOTE — Telephone Encounter (Signed)
Per ov note, pt. was to increase Aricept to 10mg  daily after one month.  [facility]

## 2017-06-06 NOTE — Addendum Note (Signed)
Addended by: Candis SchatzMISENHEIMER, Marvelyn Bouchillon I on: 06/06/2017 10:18 AM   Modules accepted: Orders

## 2017-06-11 ENCOUNTER — Ambulatory Visit: Payer: Self-pay | Admitting: Neurology

## 2017-06-13 ENCOUNTER — Encounter: Payer: Self-pay | Admitting: Neurology

## 2017-08-18 ENCOUNTER — Emergency Department (HOSPITAL_COMMUNITY): Payer: Medicare HMO

## 2017-08-18 ENCOUNTER — Encounter (HOSPITAL_COMMUNITY): Payer: Self-pay | Admitting: Emergency Medicine

## 2017-08-18 ENCOUNTER — Emergency Department (HOSPITAL_COMMUNITY)
Admission: EM | Admit: 2017-08-18 | Discharge: 2017-08-18 | Disposition: A | Discharge status: Home / Self Care | Payer: Medicare HMO | Attending: Emergency Medicine | Admitting: Emergency Medicine

## 2017-08-18 DIAGNOSIS — R51 Headache: Secondary | ICD-10-CM | POA: Diagnosis not present

## 2017-08-18 DIAGNOSIS — Z7984 Long term (current) use of oral hypoglycemic drugs: Secondary | ICD-10-CM | POA: Diagnosis not present

## 2017-08-18 DIAGNOSIS — I10 Essential (primary) hypertension: Secondary | ICD-10-CM | POA: Insufficient documentation

## 2017-08-18 DIAGNOSIS — Z7982 Long term (current) use of aspirin: Secondary | ICD-10-CM | POA: Insufficient documentation

## 2017-08-18 DIAGNOSIS — K047 Periapical abscess without sinus: Secondary | ICD-10-CM | POA: Diagnosis not present

## 2017-08-18 DIAGNOSIS — E119 Type 2 diabetes mellitus without complications: Secondary | ICD-10-CM | POA: Insufficient documentation

## 2017-08-18 DIAGNOSIS — M6281 Muscle weakness (generalized): Secondary | ICD-10-CM | POA: Diagnosis present

## 2017-08-18 DIAGNOSIS — Z7901 Long term (current) use of anticoagulants: Secondary | ICD-10-CM | POA: Diagnosis not present

## 2017-08-18 DIAGNOSIS — Z79899 Other long term (current) drug therapy: Secondary | ICD-10-CM | POA: Diagnosis not present

## 2017-08-18 LAB — CBC
HCT: 39 % (ref 36.0–46.0)
Hemoglobin: 13 g/dL (ref 12.0–15.0)
MCH: 31.7 pg (ref 26.0–34.0)
MCHC: 33.3 g/dL (ref 30.0–36.0)
MCV: 95.1 fL (ref 78.0–100.0)
Platelets: 313 K/uL (ref 150–400)
RBC: 4.1 MIL/uL (ref 3.87–5.11)
RDW: 12.8 % (ref 11.5–15.5)
WBC: 9.8 K/uL (ref 4.0–10.5)

## 2017-08-18 LAB — CBG MONITORING, ED
GLUCOSE-CAPILLARY: 79 mg/dL (ref 65–99)
Glucose-Capillary: 151 mg/dL — ABNORMAL HIGH (ref 65–99)
Glucose-Capillary: 41 mg/dL — CL (ref 65–99)
Glucose-Capillary: 89 mg/dL (ref 65–99)
Glucose-Capillary: 106 mg/dL — ABNORMAL HIGH (ref 65–99)

## 2017-08-18 LAB — URINALYSIS, ROUTINE W REFLEX MICROSCOPIC
BILIRUBIN URINE: NEGATIVE
Glucose, UA: NEGATIVE mg/dL
HGB URINE DIPSTICK: NEGATIVE
KETONES UR: 5 mg/dL — AB
Leukocytes, UA: NEGATIVE
NITRITE: NEGATIVE
PH: 5 (ref 5.0–8.0)
Protein, ur: NEGATIVE mg/dL
Specific Gravity, Urine: 1.016 (ref 1.005–1.030)

## 2017-08-18 LAB — BASIC METABOLIC PANEL WITH GFR
Anion gap: 11 (ref 5–15)
BUN: 8 mg/dL (ref 6–20)
CO2: 24 mmol/L (ref 22–32)
Calcium: 9.4 mg/dL (ref 8.9–10.3)
Chloride: 103 mmol/L (ref 101–111)
Creatinine, Ser: 0.77 mg/dL (ref 0.44–1.00)
GFR calc Af Amer: >60 mL/min (ref >60)
GFR calc non Af Amer: >60 mL/min (ref >60)
Glucose, Bld: 58 mg/dL — ABNORMAL LOW (ref 65–99)
Potassium: 3.4 mmol/L — ABNORMAL LOW (ref 3.5–5.1)
Sodium: 138 mmol/L (ref 135–145)

## 2017-08-18 LAB — I-STAT TROPONIN, ED: Troponin i, poc: 0 ng/mL (ref 0.00–0.08)

## 2017-08-18 MED ORDER — POTASSIUM CHLORIDE CRYS ER 20 MEQ PO TBCR
40.0000 meq | EXTENDED_RELEASE_TABLET | Freq: Once | ORAL | Status: AC
  Administered 2017-08-18: 40 meq via ORAL
  Filled 2017-08-18: qty 2

## 2017-08-18 MED ORDER — SODIUM CHLORIDE 0.9 % IV SOLN
3.0000 g | Freq: Once | INTRAVENOUS | Status: AC
  Administered 2017-08-18: 3 g via INTRAVENOUS
  Filled 2017-08-18: qty 3

## 2017-08-18 MED ORDER — IOPAMIDOL (ISOVUE-300) INJECTION 61%
INTRAVENOUS | Status: AC
  Administered 2017-08-18: 75 mL
  Filled 2017-08-18: qty 75

## 2017-08-18 MED ORDER — DEXTROSE 50 % IV SOLN
INTRAVENOUS | Status: AC
  Administered 2017-08-18: 50 mL
  Filled 2017-08-18: qty 50

## 2017-08-18 MED ORDER — SODIUM CHLORIDE 0.9 % IV BOLUS (SEPSIS)
1000.0000 mL | Freq: Once | INTRAVENOUS | Status: AC
  Administered 2017-08-18: 1000 mL via INTRAVENOUS

## 2017-08-18 MED ORDER — AMOXICILLIN-POT CLAVULANATE 875-125 MG PO TABS: 1.0000 | ORAL_TABLET | Freq: Two times a day (BID) | ORAL | 0 refills | Status: AC

## 2017-08-18 MED ORDER — HYDROCODONE-ACETAMINOPHEN 5-325 MG PO TABS: 1.0000 | ORAL_TABLET | Freq: Four times a day (QID) | ORAL | 0 refills | Status: DC | PRN

## 2017-08-18 MED ORDER — KETOROLAC TROMETHAMINE 15 MG/ML IJ SOLN
15.0000 mg | Freq: Once | INTRAMUSCULAR | Status: AC
  Administered 2017-08-18: 15 mg via INTRAVENOUS
  Filled 2017-08-18: qty 1

## 2017-08-18 NOTE — ED Notes (Signed)
EMT Mellody DanceKeith made this RN aware that patient's CBG is 41 and he was unable to locate another RN. Pt a/ox4, D50 given, Dr. Erma HeritageIsaacs at bedside to assess patient.

## 2017-08-18 NOTE — ED Notes (Signed)
ED tech collected CBG value of 79. EDP made aware. Orange juice given. Will reassess.

## 2017-08-18 NOTE — ED Notes (Signed)
cbg reads 41 Rn made aware starting iv at bedside

## 2017-08-18 NOTE — ED Triage Notes (Signed)
Pt. Stated, My eyes have been burring for the last 2 days. My face feels like half of it is gone.  Started 2 days ago.  My body just aches.  No weakness.

## 2017-08-18 NOTE — ED Provider Notes (Signed)
[facility]-EMERGENCY DEPT Provider Note   CSN: [id number] Arrival date & time: 08/18/17  1134     History   Chief Complaint Chief Complaint  Patient presents with  . Weakness  . Burning Eyes    HPI Jacqueline Ball is a 76 y.o. female.  HPI  76 yo F with extensive PMHx as below here with left-sided headache and generalized weakness. Pt states that over the past 2-3 weeks, she has had aching, throbbing pain in the top, left three teeth she has remaining. She has known caries here and was supposed to get them removed, but has been unable to find a dentist. Over the last week, she has had aching, throbbing, severe pain that is now intermittently sharp. She has noticed swelling in her left face x 48 hours with sharp,stabbing pains throughout her head. Pain is worse with biting on food and touching her teeth. No fevers. NO drainage. She has noticed her face seems more swollen to the left but no weakness. She states it feels different but on elaboration states this is because it hurts. No numbness. No vision changes. No speech changes. No focal weakness or numbness. NO difficulty swallowing.  Past Medical History:  Diagnosis Date  . Anxiety   . Atrial fibrillation (HCC)   . Diabetes mellitus   . Hyperlipidemia   . Hypertension   . Memory loss   . Mood disorder (HCC)   . Physical exam, annual 10/22/06  . Renal cyst     Patient Active Problem List   Diagnosis Date Noted  . Late onset Alzheimer's disease without behavioral disturbance 01/21/2017  . Chronic atrial fibrillation (HCC) 11/02/2015  . Essential hypertension 11/02/2015  . Type 2 diabetes mellitus without complication, without long-term current use of insulin (HCC) 11/02/2015  . Cognitive impairment 10/31/2015  . Chest pain 11/22/2014  . Muscle cramps 04/17/2012  . Bilateral shoulder pain 03/09/2012  . Essential hypertension 03/28/2011  . Diabetes mellitus type 2, noninsulin dependent (HCC) 03/28/2011  . Atypical anxiety disorder  03/28/2011    Past Surgical History:  Procedure Laterality Date  . ABDOMINAL HYSTERECTOMY      OB History    No data available       Home Medications    Prior to Admission medications   Medication Sig Start Date End Date Taking? Authorizing Provider  acetaminophen (TYLENOL) 500 MG tablet Take 500 mg by mouth every 6 (six) hours as needed for pain.   Yes [provider]  apixaban (ELIQUIS) 5 MG TABS tablet Take 1 tablet (5 mg total) by mouth 2 (two) times daily. 11/24/14  Yes Rinaldo Cloud, MD  aspirin 325 MG tablet Take 325 mg by mouth daily.   Yes [provider]  Cholecalciferol (VITAMIN D-3) 5000 units TABS Take 5,000 Units by mouth daily.   Yes [provider]  donepezil (ARICEPT) 10 MG tablet Take 1 tablet (10 mg total) by mouth at bedtime. Patient taking differently: Take 5 mg by mouth at bedtime.  06/06/17  Yes Marvel Plan, MD  esomeprazole (NEXIUM) 40 MG capsule Take 40 mg by mouth daily at 12 noon.   Yes [provider]  estradiol (CLIMARA) 0.06 MG/24HR Place 0.06 patches onto the skin once a week. 08/12/17  Yes [provider]  Flaxseed, Linseed, (FLAX SEED OIL) 1000 MG CAPS Take 1,000 mg by mouth daily.   Yes [provider]  glimepiride (AMARYL) 4 MG tablet Take 4 mg by mouth daily with breakfast.   Yes [provider]  ibuprofen (ADVIL,MOTRIN) 200 MG tablet Take 200 mg by mouth every 6 (six) hours as needed for moderate pain.   Yes [provider]  LORazepam (ATIVAN) 1 MG tablet Take 1 mg by mouth at bedtime.  05/27/12  Yes [provider]  meloxicam (MOBIC) 7.5 MG tablet Take 7.5 mg by mouth daily.   Yes [provider]  metFORMIN (GLUCOPHAGE) 500 MG tablet Take 500 mg by mouth daily after supper.  08/23/15  Yes [provider]  metoprolol succinate (TOPROL-XL) 25 MG 24 hr tablet Take 12.5 mg by mouth daily.   Yes [provider]  VITAMIN E PO Take 1 capsule by mouth  daily.   Yes [provider]  amiodarone (PACERONE) 200 MG tablet Take 1 tablet (200 mg total) by mouth 2 (two) times daily. Patient not taking: Reported on 08/18/2017 11/24/14   Rinaldo Cloud, MD  amoxicillin-clavulanate (AUGMENTIN) 875-125 MG tablet Take 1 tablet by mouth every 12 (twelve) hours. 08/18/17 08/28/17  Shaune Pollack, MD  HYDROcodone-acetaminophen (NORCO/VICODIN) 5-325 MG tablet Take 1 tablet by mouth every 6 (six) hours as needed for severe pain. 08/18/17   Shaune Pollack, MD    Family History Family History  Problem Relation Age of Onset  . Diabetes Sister     Social History Social History  Substance Use Topics  . Smoking status: Never Smoker  . Smokeless tobacco: Never Used  . Alcohol use No     Allergies   Metoprolol   Review of Systems Review of Systems  Constitutional: Positive for fatigue. Negative for chills and fever.  HENT: Positive for dental problem and facial swelling. Negative for congestion, rhinorrhea and sore throat.   Eyes: Negative for visual disturbance.  Respiratory: Negative for cough, shortness of breath and wheezing.   Cardiovascular: Negative for chest pain and leg swelling.  Gastrointestinal: Negative for abdominal pain, diarrhea, nausea and vomiting.  Genitourinary: Negative for dysuria, flank pain, vaginal bleeding and vaginal discharge.  Musculoskeletal: Negative for neck pain.  Skin: Negative for rash.  Allergic/Immunologic: Negative for immunocompromised state.  Neurological: Positive for weakness (generalized). Negative for syncope and headaches.  Hematological: Does not bruise/bleed easily.  All other systems reviewed and are negative.    Physical Exam Updated Vital Signs BP 120/78   Pulse 83   Temp 98.3 F (36.8 C) (Oral)   Resp (!) 26   Ht 5\' 4"  (1.626 m)   Wt 67.1 kg (148 lb)   SpO2 98%   BMI 25.40 kg/m   Physical Exam  Constitutional: She appears well-developed and well-nourished. No distress.  HENT:    Head: Normocephalic and atraumatic.  Markedly poor dentition. She has three remaining left upper premolars/canine, with marked, severe tenderness upon palpation of the anterior tooth. This reproduces her pain. She has associated moderate maxillary facial edema, without erythema. No gingival abscesses.   Eyes: Conjunctivae are normal.  Neck: Neck supple.  Cardiovascular: Normal rate, regular rhythm and normal heart sounds.  Exam reveals no friction rub.   No murmur heard. Pulmonary/Chest: Effort normal and breath sounds normal. No respiratory distress. She has no wheezes. She has no rales.  Abdominal: She exhibits no distension.  Musculoskeletal: She exhibits no edema.  Neurological: She exhibits normal muscle tone.  Skin: Skin is warm. Capillary refill takes less than 2 seconds.  Psychiatric: She has a normal mood and affect.  Nursing note and vitals reviewed.   Neurological Exam:  Mental Status: Alert and oriented to person, place, and time. Attention and concentration  normal. Speech clear. Recent memory is intact. Cranial Nerves: Visual fields grossly intact. EOMI and PERRLA. No nystagmus noted. Facial sensation intact at forehead, maxillary cheek, and chin/mandible bilaterally. There is mild flattening of left NLF but this is likely from her facial swelling, her underlying facial movement is fully intact Hearing grossly normal. Uvula is midline, and palate elevates symmetrically. Normal SCM and trapezius strength. Tongue midline without fasciculations. Motor: Muscle strength 5/5 in proximal and distal UE and LE bilaterally. No pronator drift. Muscle tone normal. Reflexes: 2+ and symmetrical in all four extremities.  Sensation: Intact to light touch in upper and lower extremities distally bilaterally.  Gait: Normal without ataxia. Coordination: Normal FTN bilaterally.    ED Treatments / Results  Labs (all labs ordered are listed, but only abnormal results are displayed) Labs Reviewed   BASIC METABOLIC PANEL - Abnormal; Notable for the following:       Result Value   Potassium 3.4 (*)    Glucose, Bld 58 (*)    All other components within normal limits  URINALYSIS, ROUTINE W REFLEX MICROSCOPIC - Abnormal; Notable for the following:    APPearance HAZY (*)    Ketones, ur 5 (*)    All other components within normal limits  CBG MONITORING, ED - Abnormal; Notable for the following:    Glucose-Capillary 41 (*)    All other components within normal limits  CBG MONITORING, ED - Abnormal; Notable for the following:    Glucose-Capillary 151 (*)    All other components within normal limits  CBG MONITORING, ED - Abnormal; Notable for the following:    Glucose-Capillary 106 (*)    All other components within normal limits  CBC  I-STAT TROPONIN, ED  CBG MONITORING, ED  CBG MONITORING, ED    EKG  EKG Interpretation  Date/Time:  Sunday August 18 2017 11:40:05 EDT Ventricular Rate:  63 PR Interval:  174 QRS Duration: 70 QT Interval:  424 QTC Calculation: 433 R Axis:   33 Text Interpretation:  Normal sinus rhythm Anterior infarct , age undetermined Abnormal ECG Since last EKG, non-specific St changes in anterior leads, which have been previously noted Confirmed by Shaune Pollack [phone]) on 08/18/2017 1:53:25 PM       Radiology Ct Head Wo Contrast  Result Date: 08/18/2017 CLINICAL DATA:  Left facial pain for the past 2 weeks, worse today. EXAM: CT HEAD WITHOUT CONTRAST CT MAXILLOFACIAL WITH CONTRAST TECHNIQUE: Multidetector CT imaging of the head was performed using the standard protocol without IV contrast. CT imaging of the maxillofacial structures was performed with intravenous contrast. Multiplanar CT image reconstructions were also generated. CONTRAST:  75mL ISOVUE-300 IOPAMIDOL (ISOVUE-300) INJECTION 61% COMPARISON:  Head CT dated 04/15/2016. FINDINGS: Brain: Diffusely enlarged ventricles and subarachnoid spaces. No intracranial hemorrhage, mass lesion or CT evidence  of acute infarction. Vascular: No hyperdense vessel or unexpected calcification. Skull: Normal. Negative for fracture or focal lesion. Sinuses/Orbits: Mild left maxillary sinus mucosal thickening. Unremarkable orbits. Other: None. CT MAXILLOFACIAL Osseous: Multiple missing upper and lower teeth. The more anterior to the upper teeth remaining on the left has some periapical lucency. The more posterior upper left tooth is fractured, most likely held together by non radiopaque material. No other maxillofacial bony abnormalities are seen. Orbits: Negative. No traumatic or inflammatory finding. Sinuses: Mild left maxillary sinus mucosal thickening. Soft tissues: Unremarkable.  No abscess. IMPRESSION: 1. No acute abnormality. 2. Stable mild diffuse cerebral and cerebellar atrophy. 3. Small periapical lucency associated with the more anterior of to  remaining left upper teeth. This could be due to current or previous periapical abscess. 4. The more posterior of the 2 remaining left upper teeth is fractured, most likely held together by non radiopaque material. 5. No maxillofacial soft tissue abscess. Electronically Signed   By: Beckie SaltsSteven  Reid M.D.   On: 08/18/2017 14:35   Ct Maxillofacial W Contrast  Result Date: 08/18/2017 CLINICAL DATA:  Left facial pain for the past 2 weeks, worse today. EXAM: CT HEAD WITHOUT CONTRAST CT MAXILLOFACIAL WITH CONTRAST TECHNIQUE: Multidetector CT imaging of the head was performed using the standard protocol without IV contrast. CT imaging of the maxillofacial structures was performed with intravenous contrast. Multiplanar CT image reconstructions were also generated. CONTRAST:  75mL ISOVUE-300 IOPAMIDOL (ISOVUE-300) INJECTION 61% COMPARISON:  Head CT dated 04/15/2016. FINDINGS: Brain: Diffusely enlarged ventricles and subarachnoid spaces. No intracranial hemorrhage, mass lesion or CT evidence of acute infarction. Vascular: No hyperdense vessel or unexpected calcification. Skull: Normal.  Negative for fracture or focal lesion. Sinuses/Orbits: Mild left maxillary sinus mucosal thickening. Unremarkable orbits. Other: None. CT MAXILLOFACIAL Osseous: Multiple missing upper and lower teeth. The more anterior to the upper teeth remaining on the left has some periapical lucency. The more posterior upper left tooth is fractured, most likely held together by non radiopaque material. No other maxillofacial bony abnormalities are seen. Orbits: Negative. No traumatic or inflammatory finding. Sinuses: Mild left maxillary sinus mucosal thickening. Soft tissues: Unremarkable.  No abscess. IMPRESSION: 1. No acute abnormality. 2. Stable mild diffuse cerebral and cerebellar atrophy. 3. Small periapical lucency associated with the more anterior of to remaining left upper teeth. This could be due to current or previous periapical abscess. 4. The more posterior of the 2 remaining left upper teeth is fractured, most likely held together by non radiopaque material. 5. No maxillofacial soft tissue abscess. Electronically Signed   By: Beckie SaltsSteven  Reid M.D.   On: 08/18/2017 14:35    Procedures Procedures (including critical care time)  Medications Ordered in ED Medications  dextrose 50 % solution (50 mLs  Given 08/18/17 1239)  iopamidol (ISOVUE-300) 61 % injection (75 mLs  Contrast Given 08/18/17 1414)  sodium chloride 0.9 % bolus 1,000 mL (0 mLs Intravenous Stopped 08/18/17 1619)  ketorolac (TORADOL) 15 MG/ML injection 15 mg (15 mg Intravenous Given 08/18/17 1440)  Ampicillin-Sulbactam (UNASYN) 3 g in sodium chloride 0.9 % 100 mL IVPB (0 g Intravenous Stopped 08/18/17 1619)  potassium chloride SA (K-DUR,KLOR-CON) CR tablet 40 mEq (40 mEq Oral Given 08/18/17 1535)     Initial Impression / Assessment and Plan / ED Course  I have reviewed the triage vital signs and the nursing notes.  Pertinent labs & imaging results that were available during my care of the patient were reviewed by me and considered in my medical decision  making (see chart for details).      76 yo F here with lef facial pain, poor PO intake 2/2 this pain, and mild hypoglycemia (diabetic, has not eaten today). There was a question of facial asymmetry by nursing, but on my assessment pt is fully neurologically intact (see images in chart). I suspect she has mild facial asymmetry more so 2/2 dental pain/caries and swelling, rather than neuro deficit. She has NO other signs of neurological deficit. Her lab work is very reassuring. She does appear mildly dehydrated which I suspect is 2/2 poor PO intake in setting of her dental pain. Will give fluids, check CT scan.  CT scan is c/w odontogenic infection. She has no  sublingual swelling or s/s Ludwig's. No evidence of RPA, PTA, or deep neck infection. No signs of CVA on CT. Her pain is improved in ED and she has been given fluids. Will provide with dental referral, outpt abx, and good return precautions. Repeat, serial neuro exams remain completely nora ml.  Final Clinical Impressions(s) / ED Diagnoses   Final diagnoses:  Dental abscess    New Prescriptions Discharge Medication List as of 08/18/2017  3:55 PM    START taking these medications   Details  amoxicillin-clavulanate (AUGMENTIN) 875-125 MG tablet Take 1 tablet by mouth every 12 (twelve) hours., Starting Sun 08/18/2017, Until Wed 08/28/2017, Print    HYDROcodone-acetaminophen (NORCO/VICODIN) 5-325 MG tablet Take 1 tablet by mouth every 6 (six) hours as needed for severe pain., Starting Sun 08/18/2017, Print         Shaune Pollack, MD 08/18/17 [phone]

## 2017-08-18 NOTE — ED Notes (Signed)
Pt repeat CBG 109. Pt aware that CBG has been low during stay. Verbalizes understanding that she can not fast. Pt states she has snacks; pt states her husband will pick her and her grandchildren up from the ED. Pt A&Ox4 at discharge. NAD.

## 2017-08-18 NOTE — ED Notes (Signed)
No neuro deficients, no arm drift. faciial drop to left but folds are equal. Bilateral = grips.

## 2017-08-18 NOTE — ED Notes (Signed)
Patient transported to CT 

## 2017-08-18 NOTE — ED Notes (Signed)
MD at bedside. 

## 2018-01-20 ENCOUNTER — Emergency Department (HOSPITAL_COMMUNITY): Payer: Medicare HMO

## 2018-01-20 ENCOUNTER — Other Ambulatory Visit: Payer: Self-pay

## 2018-01-20 ENCOUNTER — Encounter (HOSPITAL_COMMUNITY): Payer: Self-pay | Admitting: *Deleted

## 2018-01-20 DIAGNOSIS — Z79899 Other long term (current) drug therapy: Secondary | ICD-10-CM | POA: Insufficient documentation

## 2018-01-20 DIAGNOSIS — Z7984 Long term (current) use of oral hypoglycemic drugs: Secondary | ICD-10-CM | POA: Insufficient documentation

## 2018-01-20 DIAGNOSIS — R51 Headache: Secondary | ICD-10-CM | POA: Diagnosis present

## 2018-01-20 DIAGNOSIS — E119 Type 2 diabetes mellitus without complications: Secondary | ICD-10-CM | POA: Insufficient documentation

## 2018-01-20 DIAGNOSIS — F430 Acute stress reaction: Secondary | ICD-10-CM | POA: Insufficient documentation

## 2018-01-20 DIAGNOSIS — I10 Essential (primary) hypertension: Secondary | ICD-10-CM | POA: Diagnosis not present

## 2018-01-20 LAB — CBC WITH DIFFERENTIAL/PLATELET
BASOS PCT: 0 %
Basophils Absolute: 0 10*3/uL (ref 0.0–0.1)
EOS PCT: 0 %
Eosinophils Absolute: 0 10*3/uL (ref 0.0–0.7)
HCT: 40.3 % (ref 36.0–46.0)
HEMOGLOBIN: 13.5 g/dL (ref 12.0–15.0)
Lymphocytes Relative: 21 %
Lymphs Abs: 1.8 10*3/uL (ref 0.7–4.0)
MCH: 32 pg (ref 26.0–34.0)
MCHC: 33.5 g/dL (ref 30.0–36.0)
MCV: 95.5 fL (ref 78.0–100.0)
Monocytes Absolute: 0.6 10*3/uL (ref 0.1–1.0)
Monocytes Relative: 6 %
NEUTROS PCT: 73 %
Neutro Abs: 6.6 10*3/uL (ref 1.7–7.7)
PLATELETS: 380 10*3/uL (ref 150–400)
RBC: 4.22 MIL/uL (ref 3.87–5.11)
RDW: 13.1 % (ref 11.5–15.5)
WBC: 9 10*3/uL (ref 4.0–10.5)

## 2018-01-20 LAB — COMPREHENSIVE METABOLIC PANEL
ALK PHOS: 81 U/L (ref 38–126)
ALT: 23 U/L (ref 14–54)
AST: 28 U/L (ref 15–41)
Albumin: 4.1 g/dL (ref 3.5–5.0)
Anion gap: 12 (ref 5–15)
BILIRUBIN TOTAL: 0.5 mg/dL (ref 0.3–1.2)
BUN: 17 mg/dL (ref 6–20)
CALCIUM: 9.7 mg/dL (ref 8.9–10.3)
CO2: 23 mmol/L (ref 22–32)
CREATININE: 0.88 mg/dL (ref 0.44–1.00)
Chloride: 106 mmol/L (ref 101–111)
Glucose, Bld: 69 mg/dL (ref 65–99)
Potassium: 3.7 mmol/L (ref 3.5–5.1)
Sodium: 141 mmol/L (ref 135–145)
Total Protein: 7.2 g/dL (ref 6.5–8.1)

## 2018-01-20 NOTE — ED Provider Notes (Signed)
Patient placed in Quick Look pathway, seen and evaluated   Chief Complaint: Headache  HPI:   Patient presents to the ED with complaints of head pressure at approximately 12:00 today while driving.  Patient reports that this is been ongoing for the past year and a half.  States that the symptoms are intermittent and happens about every 3-4 months.  States the headaches will self resolve.  Denies any headache at this time.  Denies any red flag symptoms.  ROS: Reports associated vision changes including blurry vision and nausea.  Denies any fevers, chills, neck pain, lightheadedness, dizziness, syncope, cp, sob.  (one)  Physical Exam:   Gen: No distress  Neuro: Awake and Alert  Skin: Warm    Focused Exam: The patient is alert, attentive, and oriented x 3. Speech is clear. Cranial nerve II-VII grossly intact. Negative pronator drift. Sensation intact. Strength 5/5 in all extremities. Reflexes 2+ and symmetric at biceps, triceps, knees, and ankles. Rapid alternating movement and fine finger movements intact. Romberg is absent. Posture and gait normal.  No nuchal rigidity.    Initiation of care has begun. The patient has been counseled on the process, plan, and necessity for staying for the completion/evaluation, and the remainder of the medical screening examination    Wallace KellerLeaphart, Junaid Wurzer T, PA-C 01/20/18 Cristine [id number]    Ray, Danielle, MD 01/21/18 [phone]

## 2018-01-20 NOTE — ED Triage Notes (Signed)
Pt reports episode of headache and "head pressure" today while driving. Had blurred vision and nausea, episode lasted only 5-7 mins. Denies hx of headaches or migraines. Has no neuro deficits at triage. Grips equal, no arm drift, speech clear.

## 2018-01-21 ENCOUNTER — Emergency Department (HOSPITAL_COMMUNITY)
Admission: EM | Admit: 2018-01-21 | Discharge: 2018-01-21 | Disposition: A | Discharge status: Home / Self Care | Payer: Medicare HMO | Attending: Emergency Medicine | Admitting: Emergency Medicine

## 2018-01-21 DIAGNOSIS — F430 Acute stress reaction: Secondary | ICD-10-CM

## 2018-01-21 DIAGNOSIS — R519 Headache, unspecified: Secondary | ICD-10-CM

## 2018-01-21 DIAGNOSIS — R51 Headache: Secondary | ICD-10-CM

## 2018-01-21 NOTE — Discharge Instructions (Signed)
We recommend Tylenol for continued symptoms as needed.  Continue taking your daily medications.  Follow-up with your primary care doctor regarding your visit today.  Your workup in the emergency department was reassuring and did not reveal a concerning cause of your symptoms.  Return to the ED for any other new or concerning symptoms.

## 2018-01-21 NOTE — ED Provider Notes (Signed)
[facility] Provider Note   CSN: [id number] Arrival date & time: 01/20/18  1554     History   Chief Complaint Chief Complaint  Patient presents with  . Headache    HPI Jacqueline Ball is a 77 y.o. female.  [age] year old female with a history of hypertension, diabetes, dyslipidemia, and atrial fibrillation on chronic Eliquis presents to the emergency department for evaluation of headache.  Patient describes her pain as "a rush of blood" up the right side of her temporal and parietal scalp.  Symptoms associated with mild blurry vision and last approximately 5-7 minutes before resolving.  She denies taking any medications for her pain.  She has had mild associated nausea at times.  She has had similar episodes of pain fairly consistently over the past few years.  She notes that episodes come on more frequently when she is increasingly stressed.  No recent head injury or trauma.  She has been compliant with her daily medications.  No associated vision loss, fevers, syncope, vomiting.      Past Medical History:  Diagnosis Date  . Anxiety   . Atrial fibrillation (HCC)   . Diabetes mellitus   . Hyperlipidemia   . Hypertension   . Memory loss   . Mood disorder (HCC)   . Physical exam, annual 10/22/06  . Renal cyst     Patient Active Problem List   Diagnosis Date Noted  . Late onset Alzheimer's disease without behavioral disturbance 01/21/2017  . Chronic atrial fibrillation (HCC) 11/02/2015  . Essential hypertension 11/02/2015  . Type 2 diabetes mellitus without complication, without long-term current use of insulin (HCC) 11/02/2015  . Cognitive impairment 10/31/2015  . Chest pain 11/22/2014  . Muscle cramps 04/17/2012  . Bilateral shoulder pain 03/09/2012  . Essential hypertension 03/28/2011  . Diabetes mellitus type 2, noninsulin dependent (HCC) 03/28/2011  . Atypical anxiety disorder 03/28/2011    Past Surgical History:  Procedure  Laterality Date  . ABDOMINAL HYSTERECTOMY      OB History    No data available       Home Medications    Prior to Admission medications   Medication Sig Start Date End Date Taking? Authorizing Provider  acetaminophen (TYLENOL) 500 MG tablet Take 500 mg by mouth every 6 (six) hours as needed for pain.    [provider]  amiodarone (PACERONE) 200 MG tablet Take 1 tablet (200 mg total) by mouth 2 (two) times daily. Patient not taking: Reported on 08/18/2017 11/24/14   Rinaldo CloudHarwani, Mohan, MD  apixaban (ELIQUIS) 5 MG TABS tablet Take 1 tablet (5 mg total) by mouth 2 (two) times daily. 11/24/14   Rinaldo CloudHarwani, Mohan, MD  aspirin 325 MG tablet Take 325 mg by mouth daily.    [provider]  Cholecalciferol (VITAMIN D-3) 5000 units TABS Take 5,000 Units by mouth daily.    [provider]  donepezil (ARICEPT) 10 MG tablet Take 1 tablet (10 mg total) by mouth at bedtime. Patient taking differently: Take 5 mg by mouth at bedtime.  06/06/17   Marvel PlanXu, Jindong, MD  esomeprazole (NEXIUM) 40 MG capsule Take 40 mg by mouth daily at 12 noon.    [provider]  estradiol (CLIMARA) 0.06 MG/24HR Place 0.06 patches onto the skin once a week. 08/12/17   [provider]  Flaxseed, Linseed, (FLAX SEED OIL) 1000 MG CAPS Take 1,000 mg by mouth daily.    [provider]  glimepiride (AMARYL) 4 MG tablet Take 4 mg  by mouth daily with breakfast.    [provider]  HYDROcodone-acetaminophen (NORCO/VICODIN) 5-325 MG tablet Take 1 tablet by mouth every 6 (six) hours as needed for severe pain. 08/18/17   Shaune Pollack, MD  ibuprofen (ADVIL,MOTRIN) 200 MG tablet Take 200 mg by mouth every 6 (six) hours as needed for moderate pain.    [provider]  LORazepam (ATIVAN) 1 MG tablet Take 1 mg by mouth at bedtime.  05/27/12   [provider]  meloxicam (MOBIC) 7.5 MG tablet Take 7.5 mg by mouth daily.    [provider]  metFORMIN (GLUCOPHAGE) 500 MG  tablet Take 500 mg by mouth daily after supper.  08/23/15   [provider]  metoprolol succinate (TOPROL-XL) 25 MG 24 hr tablet Take 12.5 mg by mouth daily.    [provider]  VITAMIN E PO Take 1 capsule by mouth daily.    [provider]    Family History Family History  Problem Relation Age of Onset  . Diabetes Sister     Social History Social History   Tobacco Use  . Smoking status: Never Smoker  . Smokeless tobacco: Never Used  Substance Use Topics  . Alcohol use: No  . Drug use: No     Allergies   Metoprolol   Review of Systems Review of Systems Ten systems reviewed and are negative for acute change, except as noted in the HPI.    Physical Exam Updated Vital Signs BP 135/80   Pulse 60   Temp 97.8 F (36.6 C) (Oral)   Resp 17   SpO2 99%   Physical Exam  Constitutional: She is oriented to person, place, and time. She appears well-developed and well-nourished. No distress.  Nontoxic and in NAD  HENT:  Head: Normocephalic and atraumatic.  Eyes: Conjunctivae and EOM are normal. No scleral icterus.  Neck: Normal range of motion.  No meningismus  Cardiovascular: Normal rate, regular rhythm and intact distal pulses.  Pulmonary/Chest: Effort normal. No stridor. No respiratory distress.  Respirations even and unlabored  Musculoskeletal: Normal range of motion.  Neurological: She is alert and oriented to person, place, and time. No cranial nerve deficit. She exhibits normal muscle tone. Coordination normal.  GCS 15. Speech is goal oriented. No cranial nerve deficits appreciated; symmetric eyebrow raise, no facial drooping, tongue midline. Patient has equal grip strength bilaterally with 5/5 strength against resistance in all major muscle groups bilaterally. Sensation to light touch intact. Patient moves extremities without ataxia. Patient ambulatory with steady gait.  Skin: Skin is warm and dry. No rash noted. She is not diaphoretic. No  erythema. No pallor.  Psychiatric: She has a normal mood and affect. Her behavior is normal.  Nursing note and vitals reviewed.    ED Treatments / Results  Labs (all labs ordered are listed, but only abnormal results are displayed) Labs Reviewed  COMPREHENSIVE METABOLIC PANEL  CBC WITH DIFFERENTIAL/PLATELET    EKG  EKG Interpretation None       Radiology Ct Head Wo Contrast  Result Date: 01/20/2018 CLINICAL DATA:  Headache, chronic intermittent. Intermittent blurred vision and nausea EXAM: CT HEAD WITHOUT CONTRAST TECHNIQUE: Contiguous axial images were obtained from the base of the skull through the vertex without intravenous contrast. COMPARISON:  August 18, 2017 FINDINGS: Brain: Mild diffuse atrophy is stable. There is no intracranial mass, hemorrhage, extra-axial fluid collection, or midline shift. There is slight small vessel disease in the centra semiovale bilaterally. Elsewhere gray-white compartments appear normal. No evident  acute infarct. Vascular: No hyperdense vessel. There is calcification in each carotid siphon. Skull: The bony calvarium appears intact. Sinuses/Orbits: There is mild mucosal thickening in several ethmoid air cells. Other visualized paranasal sinuses are clear. Visualized orbits appear symmetric bilaterally. Other: Visualized mastoid air cells are clear. IMPRESSION: Stable mild atrophy with slight periventricular small vessel disease. No intracranial mass or hemorrhage. No acute infarct. There is arterial vascular calcification. Mild mucosal thickening noted in several ethmoid air cells. Electronically Signed   By: Bretta Bang III M.D.   On: 01/20/2018 19:06    Procedures Procedures (including critical care time)  Medications Ordered in ED Medications - No data to display   Initial Impression / Assessment and Plan / ED Course  I have reviewed the triage vital signs and the nursing notes.  Pertinent labs & imaging results that were available  during my care of the patient were reviewed by me and considered in my medical decision making (see chart for details).     77 year old female presents to the emergency department for evaluation of chronic, recurrent headaches which have been present for at least 1.5 years.  Symptoms will last a few minutes before spontaneously resolving.  Patient notes increasing frequency when her stress level is elevated.  She denies any specific provoking features from her usual baseline that prompted her to seek evaluation today.  Patient afebrile with reassuring vital signs.  She has a noncontributory workup and normal head CT.  There is no evidence of hemorrhage, hydrocephalus, mass, infarct.  She has a nonfocal neurologic exam.  In light of symptom chronicity, I do not believe further emergent workup is indicated.  I have encouraged the patient to continue follow-up with her primary care doctor.  Return precautions discussed and provided. Patient discharged in stable condition with no unaddressed concerns.   Final Clinical Impressions(s) / ED Diagnoses   Final diagnoses:  Stress reaction  Acute nonintractable headache, unspecified headache type    ED Discharge Orders    None       Antony Madura, PA-C 01/21/18 0310

## 2018-01-21 NOTE — ED Provider Notes (Signed)
Medical screening examination/treatment/procedure(s) were conducted as a shared visit with non-physician practitioner(s) and myself.  I personally evaluated the patient during the encounter.   EKG Interpretation None      Patient is a 77 year old female who presents to the emergency department with intermittent headaches that last several minutes and then resolve spontaneously.  No current focal neurologic deficit or headache.  Denies that it is the worst headache of her life.  Labs unremarkable.  Head CT obtained in triage shows no acute abnormality.  These have been ongoing for over a year and a half.  She has not yet seen her PCP for this.  Recommended further outpatient workup.  Now completely asymptomatic.  Doubt stroke, meningitis, subarachnoid hemorrhage.   Philena Obey, Layla MawKristen N, DO 01/21/18 [phone]

## 2018-03-18 ENCOUNTER — Ambulatory Visit: Payer: Medicare Other | Admitting: Neurology

## 2018-03-18 ENCOUNTER — Encounter: Payer: Self-pay | Admitting: Neurology

## 2018-03-18 VITALS — BP 150/81 | HR 62 | Ht 64.0 in | Wt 153.6 lb

## 2018-03-18 DIAGNOSIS — G301 Alzheimer's disease with late onset: Secondary | ICD-10-CM | POA: Diagnosis not present

## 2018-03-18 DIAGNOSIS — E119 Type 2 diabetes mellitus without complications: Secondary | ICD-10-CM

## 2018-03-18 DIAGNOSIS — R4189 Other symptoms and signs involving cognitive functions and awareness: Secondary | ICD-10-CM | POA: Diagnosis not present

## 2018-03-18 DIAGNOSIS — I482 Chronic atrial fibrillation, unspecified: Secondary | ICD-10-CM

## 2018-03-18 DIAGNOSIS — Z7901 Long term (current) use of anticoagulants: Secondary | ICD-10-CM

## 2018-03-18 DIAGNOSIS — F0280 Dementia in other diseases classified elsewhere without behavioral disturbance: Secondary | ICD-10-CM | POA: Diagnosis not present

## 2018-03-18 DIAGNOSIS — I10 Essential (primary) hypertension: Secondary | ICD-10-CM | POA: Diagnosis not present

## 2018-03-18 MED ORDER — MEMANTINE HCL 10 MG PO TABS: 10.0000 mg | ORAL_TABLET | Freq: Two times a day (BID) | ORAL | 3 refills | Status: DC

## 2018-03-18 MED ORDER — MEMANTINE HCL 28 X 5 MG & 21 X 10 MG PO TABS: ORAL_TABLET | ORAL | 12 refills | Status: DC

## 2018-03-18 NOTE — Patient Instructions (Addendum)
-   continue eliquis and lipitor for stroke prevention. - follow up with Dr. Marni GriffonHawani for afib managment - continue aricept 10mg  daily. - will start nemanda to see if it helps you. Please take the medication as instructed by pharmacist.  - Follow up with your primary care physician for stroke risk factor modification. Recommend maintain blood pressure goal <130/80, diabetes with hemoglobin A1c goal below 7.0% and lipids with LDL cholesterol goal below 70 mg/dL.  - check BP at home and record. - follow up in 4 months with Jacqueline Ball.

## 2018-03-18 NOTE — Progress Notes (Signed)
NEUROLOGY CLINIC NEW PATIENT NOTE  NAME: Jacqueline Ball DOB: April 14, 1941 REFERRING PHYSICIAN: Gwenyth Benderean, Eric L, MD  I saw Jacqueline Ball as a new consult in the neurovascular clinic today regarding  Chief Complaint  Patient presents with  . Follow-up    Cognitive impairment follow up, Pt states her PCP sent her here, and does not know why, Pt states her PCP states he is memory loss  .  HPI: Jacqueline Ball is a 77 y.o. female with PMH of HTN, DM and afib on eliquis who presents as a new patient for Cognitive impairment.   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 Complains of HA at that time. She went to ED and had CT head and neck done showed no acute abnormality and no cervical fracture.   She went back to ED again on 08/26/15 for ongoing dizziness and difficulty with concentrating since the fall. Repeat head CT no acute abnormality and was diagnosed with post concussion syndrome. Since then, she had ongoing difficulty with class, decreased memory, although admit that she noted having difficulty with class prior to fall. Eventually, she failed the class recently. She went to see her PCP and was referred here for further evaluation.   I tried to get collateral information from her husband in clinic today but he is a poor historian and can not provide further information. Pt stated that she seems in a reasonable intelligence prior to fall but I can not confirm it at this time.   She has hx of HTN and DM on metoprolol and metformin. She also has afib on amiodarone and eliquis and she has been following with Dr. Sharyn LullHarwani. She is also on lipitor and cholestyramine. She is also on estrogen patch as well as plaquenil for arthritis.   She denies smoking, alcohol or illicit drugs.   04/02/16 follow up - pt has been doing the same. Has not back to class yet. Will start again this August. She left the class as  recommended by her mentor last August. Her mentor would like her to see for evaluation during the mean time. However, we referred her to see Dr. Leonides CaveZelson for neuropsych testing but that was not done. MRI brain negative and B12, FA and RPR were all negative.  01/21/17 follow up - Pt has been doing the same. 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 She has been working in [employer] for 20 years. 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 Recommend continue aricept and add namenda in the future. However, she stated that her aricept has been out and not able to get refilled. BP 110/64.   Interval History During the interval time, pt has been doing the same. She stated that she compliant with medication including aricept 10mg  daily. She told me that she had recent stress that she and her husband moved out where they lived before due to high crime rate and bad neighbor around the area. 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 I am not sure how accurate of all she told me. Her MMSE 22/30 today. BP 150/81.   Past Medical History:  Diagnosis Date  . Anxiety   . Atrial fibrillation (  HCC)   . Diabetes mellitus   . Hyperlipidemia   . Hypertension   . Memory loss   . Mood disorder (HCC)   . Physical exam, annual 10/22/06  . Renal cyst    Past Surgical History:  Procedure Laterality Date  . ABDOMINAL HYSTERECTOMY     Family History  Problem Relation Age of Onset  . Diabetes Sister    Current Outpatient Medications  Medication Sig Dispense Refill  . acetaminophen (TYLENOL) 500 MG tablet Take 500 mg by mouth every 6 (six) hours as needed for pain.    [name] apixaban (ELIQUIS) 5 MG TABS tablet Take 1 tablet (5 mg total) by mouth 2 (two) times daily. 60 tablet 3  . Cholecalciferol (VITAMIN D-3) 5000 units TABS Take 5,000  Units by mouth daily.    [name] donepezil (ARICEPT) 10 MG tablet Take 1 tablet (10 mg total) by mouth at bedtime. 90 tablet 3  . esomeprazole (NEXIUM) 40 MG capsule Take 40 mg by mouth daily at 12 noon.    [name] estradiol (CLIMARA) 0.06 MG/24HR Place 0.06 patches onto the skin once a week.    [name] glimepiride (AMARYL) 4 MG tablet Take 4 mg by mouth daily with breakfast.    . IRON PO Take by mouth.    . meloxicam (MOBIC) 7.5 MG tablet Take 7.5 mg by mouth daily.    . metFORMIN (GLUCOPHAGE) 500 MG tablet Take 500 mg by mouth daily after supper.     . metoprolol succinate (TOPROL-XL) 25 MG 24 hr tablet Take 12.5 mg by mouth daily.    . VESICARE 10 MG tablet     . memantine (NAMENDA TITRATION PAK) tablet pack 5 mg/day for =1 week; 5 mg twice daily for =1 week; 15 mg/day given in 5 mg and 10 mg separated doses for =1 week; then 10 mg twice daily 49 tablet 12  . [START ON 04/16/2018] memantine (NAMENDA) 10 MG tablet Take 1 tablet (10 mg total) by mouth 2 (two) times daily. After one month of titrating package. 60 tablet 3   No current facility-administered medications for this visit.    Allergies  Allergen Reactions  . Metoprolol Nausea And Vomiting   Social History   Socioeconomic History  . Marital status: Married    Spouse name: EDDIE Reppond  . Number of children: Not on file  . Years of education: Not on file  . Highest education level: Not on file  Occupational History  . Not on file  Social Needs  . Financial resource strain: Not on file  . Food insecurity:    Worry: Not on file    Inability: Not on file  . Transportation needs:    Medical: Not on file    Non-medical: Not on file  Tobacco Use  . Smoking status: Never Smoker  . Smokeless tobacco: Never Used  Substance and Sexual Activity  . Alcohol use: No  . Drug use: No  . Sexual activity: Not on file  Lifestyle  . Physical activity:    Days per week: Not on file    Minutes per session: Not on file  . Stress: Not on file    Relationships  . Social connections:    Talks on phone: Not on file    Gets together: Not on file    Attends religious service: Not on file    Active member of club or organization: Not on file    Attends meetings of clubs or organizations: Not on file  Relationship status: Not on file  . Intimate partner violence:    Fear of current or ex partner: Not on file    Emotionally abused: Not on file    Physically abused: Not on file    Forced sexual activity: Not on file  Other Topics Concern  . Not on file  Social History Narrative  . Not on file    Review of Systems Full 14 system review of systems performed and notable only for those listed, all others are neg:  Constitutional:   Cardiovascular: chest tightness Ear/Nose/Throat:   Skin:  Eyes:  Blurry vision Respiratory:   Gastroitestinal:  Black stool Genitourinary:  Hematology/Lymphatic:   Endocrine:  Musculoskeletal:   Allergy/Immunology:   Neurological:  Facial droop Psychiatric:  Sleep:    Physical Exam  Vitals:   03/18/18 0925  BP: (!) 150/81  Pulse: 62    General - Well nourished, well developed, in no apparent distress.  Ophthalmologic - fundi not visualized due to noncooperation.  Cardiovascular - irregularly irregular heart rate and rhythm.   Neck - supple, no nuchal rigidity .  Mental Status -  Level of arousal and orientation to time, place, and person were intact. Language including expression, naming, repetition, comprehension was assessed and found intact.  mini-mental status exam Orientation to time - 5/5 Orientation to place - 4/5 Registration - 3/3 Attention - 0/5 Delayed recall - 1/3 Naming - 2/2 Repetition - 1/1 Comprehension - 3/3 Reading - 1/1 Writing - 1/1 Visuospatial - 1/1 Total 22/30  Cranial Nerves II - XII - II - Visual field intact OU. III, IV, VI - Extraocular movements intact. V - Facial sensation intact bilaterally. VII - Facial movement intact  bilaterally. VIII - Hearing & vestibular intact bilaterally. X - Palate elevates symmetrically. XI - Chin turning & shoulder shrug intact bilaterally. XII - Tongue protrusion intact.  Motor Strength - The patient's strength was normal in all extremities and pronator drift was absent.  Bulk was normal and fasciculations were absent.   Motor Tone - Muscle tone was assessed at the neck and appendages and was normal.  Reflexes - The patient's reflexes were normal in all extremities and she had no pathological reflexes.  Sensory - Light touch, temperature/pinprick were assessed and were normal.    Coordination - The patient had normal movements in the hands and feet with no ataxia or dysmetria.  Tremor was absent.  Gait and Station - The patient's transfers, posture, gait, station, and turns were observed as normal.   Imaging  I have personally reviewed the radiological images below and agree with the radiology interpretations.  CT head 08/26/15 No acute abnormalities  MRI brain 11/07/15 This is a normal age-appropriate non-contrasted MRI of the brain. There is no significant atrophy or ischemic change. There are no acute findings.  Neuropsychology evaluation 09/13/16 - Major Neurocognitive Disorder (i.e., dementia), possible Alzheimer's disease    Lab Review Component     Latest Ref Rng 11/22/2014 11/23/2014  Cholesterol     0 - 200 mg/dL  161  Triglycerides     <150 mg/dL  78  HDL Cholesterol     >39 mg/dL  44  Total CHOL/HDL Ratio       2.6  VLDL     0 - 40 mg/dL  16  LDL (calc)     0 - 99 mg/dL  53  Hemoglobin W9U     <5.7 % 5.7 (H)   Mean Plasma Glucose     <  117 mg/dL 098 (H)   TSH     1.191 - 4.500 uIU/mL 1.380   Free T4     0.80 - 1.80 ng/dL 4.78    Component     Latest Ref Rng 10/31/2015  Folate     >3.0 ng/mL 8.9  Vitamin B12     211 - 946 pg/mL 401  RPR     Non Reactive Non Reactive     Assessment:   In summary, Velencia Lenart is a 77 y.o. female with  PMH of HTN, DM, afib on eliquis follows up for evaluation of cognitive impairment. Pt complains of cognitive impairment, difficulty concentration, memory difficulty after a fall in 07/2015. CT head x 2 no acute abnormalities. Diagnosed with post concussion syndrome. Has failed class last August. Denies memory problem before the fall but difficult to confirm at this time. Had MMSE and MOCA test showed moderate impairment, but most prominent at visuospatial, executive, calculation, abstraction and memory domains. During the 5 month interval, there was essentially no change of MOCA test. Pt does not have depression as my observation. Dementia work up so far negative including MRI, B12, A1C, FA, TSH or RPR. Was put on aricept but did not refill. 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 Started on aricept, and she stated compliance. Will add namenda this time.    Plan: - continue eliquis and lipitor for stroke prevention. - follow up with Dr. Marni Griffon for afib managment - continue aricept 10mg  daily. - will start nemanda to see if it helps you. Please take the medication as instructed by pharmacist.  - Follow up with your primary care physician for stroke risk factor modification. Recommend maintain blood pressure goal <130/80, diabetes with hemoglobin A1c goal below 7.0% and lipids with LDL cholesterol goal below 70 mg/dL.  - check BP at home and record. - follow up in 4 months with Shanda Bumps.  I spent more than 25 minutes of face to face time with the patient. Greater than 50% of time was spent in counseling and coordination of care. We discussed continue aricept and add namenda and continued follow up.   No orders of the defined types were placed in this encounter.   Meds ordered this encounter  Medications  . memantine (NAMENDA TITRATION PAK) tablet pack    Sig: 5 mg/day for =1 week; 5 mg twice daily for =1 week; 15 mg/day given in 5 mg and 10 mg separated doses for =1 week; then 10 mg twice daily     Dispense:  49 tablet    Refill:  12  . memantine (NAMENDA) 10 MG tablet    Sig: Take 1 tablet (10 mg total) by mouth 2 (two) times daily. After one month of titrating package.    Dispense:  60 tablet    Refill:  3    Patient Instructions  - continue eliquis and lipitor for stroke prevention. - follow up with Dr. Marni Griffon for afib managment - continue aricept 10mg  daily. - will start nemanda to see if it helps you. Please take the medication as instructed by pharmacist.  - Follow up with your primary care physician for stroke risk factor modification. Recommend maintain blood pressure goal <130/80, diabetes with hemoglobin A1c goal below 7.0% and lipids with LDL cholesterol goal below 70 mg/dL.  - check BP at home and record. - follow up in 4 months with Shanda Bumps.    Marvel Plan, MD PhD [facility] [address] [place], [place] [address] [phone]

## 2018-03-20 ENCOUNTER — Other Ambulatory Visit: Payer: Self-pay | Admitting: Cardiology

## 2018-03-20 DIAGNOSIS — R079 Chest pain, unspecified: Secondary | ICD-10-CM

## 2018-03-26 ENCOUNTER — Ambulatory Visit (HOSPITAL_COMMUNITY)
Admission: RE | Admit: 2018-03-26 | Discharge: 2018-03-26 | Disposition: A | Discharge status: Home / Self Care | Payer: Medicare Other | Source: Ambulatory Visit | Attending: Cardiology | Admitting: Cardiology

## 2018-03-26 DIAGNOSIS — R079 Chest pain, unspecified: Secondary | ICD-10-CM | POA: Insufficient documentation

## 2018-03-26 MED ORDER — REGADENOSON 0.4 MG/5ML IV SOLN
INTRAVENOUS | Status: AC
  Administered 2018-03-26: 11:00:00
  Filled 2018-03-26: qty 5

## 2018-03-26 MED ORDER — TECHNETIUM TC 99M TETROFOSMIN IV KIT
10.0000 | PACK | Freq: Once | INTRAVENOUS | Status: AC | PRN
  Administered 2018-03-26: 10 via INTRAVENOUS

## 2018-03-26 MED ORDER — TECHNETIUM TC 99M TETROFOSMIN IV KIT
30.0000 | PACK | Freq: Once | INTRAVENOUS | Status: AC | PRN
  Administered 2018-03-26: 30 via INTRAVENOUS

## 2018-04-10 ENCOUNTER — Encounter: Payer: Self-pay | Admitting: Adult Health

## 2018-06-04 ENCOUNTER — Encounter (HOSPITAL_COMMUNITY): Payer: Self-pay

## 2018-06-04 ENCOUNTER — Emergency Department (HOSPITAL_COMMUNITY)
Admission: EM | Admit: 2018-06-04 | Discharge: 2018-06-04 | Disposition: A | Discharge status: Home / Self Care | Payer: Medicare Other | Attending: Emergency Medicine | Admitting: Emergency Medicine

## 2018-06-04 ENCOUNTER — Emergency Department (HOSPITAL_COMMUNITY): Payer: Medicare Other

## 2018-06-04 DIAGNOSIS — E119 Type 2 diabetes mellitus without complications: Secondary | ICD-10-CM | POA: Diagnosis not present

## 2018-06-04 DIAGNOSIS — Z79899 Other long term (current) drug therapy: Secondary | ICD-10-CM | POA: Insufficient documentation

## 2018-06-04 DIAGNOSIS — Z7984 Long term (current) use of oral hypoglycemic drugs: Secondary | ICD-10-CM | POA: Insufficient documentation

## 2018-06-04 DIAGNOSIS — G301 Alzheimer's disease with late onset: Secondary | ICD-10-CM | POA: Insufficient documentation

## 2018-06-04 DIAGNOSIS — Z7901 Long term (current) use of anticoagulants: Secondary | ICD-10-CM | POA: Diagnosis not present

## 2018-06-04 DIAGNOSIS — F0280 Dementia in other diseases classified elsewhere without behavioral disturbance: Secondary | ICD-10-CM | POA: Insufficient documentation

## 2018-06-04 DIAGNOSIS — R5383 Other fatigue: Secondary | ICD-10-CM | POA: Diagnosis not present

## 2018-06-04 DIAGNOSIS — I10 Essential (primary) hypertension: Secondary | ICD-10-CM | POA: Diagnosis not present

## 2018-06-04 LAB — TSH: TSH: 1.54 u[IU]/mL (ref 0.350–4.500)

## 2018-06-04 LAB — BASIC METABOLIC PANEL
Anion gap: 7 (ref 5–15)
BUN: 11 mg/dL (ref 6–20)
CHLORIDE: 107 mmol/L (ref 101–111)
CO2: 25 mmol/L (ref 22–32)
CREATININE: 0.81 mg/dL (ref 0.44–1.00)
Calcium: 9.2 mg/dL (ref 8.9–10.3)
GFR calc Af Amer: >60 mL/min (ref >60)
GFR calc non Af Amer: >60 mL/min (ref >60)
GLUCOSE: 118 mg/dL — AB (ref 65–99)
Potassium: 3.8 mmol/L (ref 3.5–5.1)
Sodium: 139 mmol/L (ref 135–145)

## 2018-06-04 LAB — URINALYSIS, ROUTINE W REFLEX MICROSCOPIC
BILIRUBIN URINE: NEGATIVE
Glucose, UA: NEGATIVE mg/dL
HGB URINE DIPSTICK: NEGATIVE
Ketones, ur: NEGATIVE mg/dL
Leukocytes, UA: NEGATIVE
Nitrite: NEGATIVE
PH: 7 (ref 5.0–8.0)
Protein, ur: NEGATIVE mg/dL
SPECIFIC GRAVITY, URINE: 1.009 (ref 1.005–1.030)

## 2018-06-04 LAB — CBC
HEMATOCRIT: 40.7 % (ref 36.0–46.0)
Hemoglobin: 13 g/dL (ref 12.0–15.0)
MCH: 31.4 pg (ref 26.0–34.0)
MCHC: 31.9 g/dL (ref 30.0–36.0)
MCV: 98.3 fL (ref 78.0–100.0)
Platelets: 309 10*3/uL (ref 150–400)
RBC: 4.14 MIL/uL (ref 3.87–5.11)
RDW: 13.1 % (ref 11.5–15.5)
WBC: 6 10*3/uL (ref 4.0–10.5)

## 2018-06-04 LAB — T4, FREE: Free T4: 0.72 ng/dL — ABNORMAL LOW (ref 0.82–1.77)

## 2018-06-04 LAB — I-STAT TROPONIN, ED: Troponin i, poc: 0 ng/mL (ref 0.00–0.08)

## 2018-06-04 MED ORDER — ONDANSETRON 4 MG PO TBDP: 4.0000 mg | ORAL_TABLET | Freq: Once | ORAL | Status: DC

## 2018-06-04 NOTE — Discharge Instructions (Addendum)
Please read and follow all provided instructions.  Your diagnoses today include:  1. Fatigue, unspecified type     Tests performed today include: Vital signs. See below for your results today.  CBC - this showed no evidence of anemia and your white cell count was not elevated CMP -your kidney function looked reassuring and there is no evidence of any electrolyte derangements. Urinalysis -this did not show evidence of infection Chest x-ray -this did not show evidence of pneumonia EKG and troponin -this was reassuring Thyroid test -these were reassuring as well.  Your T4 was noted to be lower then normal but your TSH was within normal limits.  You will need to follow-up with your primary care provider in regards to this.  Home care instructions:  Follow any educational materials contained in this packet.  Follow-up instructions: Please follow-up with your primary care provider by the end of the week for further evaluation of symptoms and treatment   Return instructions:  Please return to the Emergency Department if you do not get better, if you get worse, or new symptoms OR  - Fever (temperature greater than 101.62F)  - Bleeding that does not stop with holding pressure to the area    -Severe pain (please note that you may be more sore the day after your accident)  - Chest Pain  - Difficulty breathing  - Severe nausea or vomiting  - Inability to tolerate food and liquids  - Passing out  - Skin becoming red around your wounds  - Change in mental status (confusion or lethargy)  - New numbness or weakness    Please return if you have any other emergent concerns.  Additional Information:  Your vital signs today were: BP (!) 142/75    Pulse 60    Temp 98.3 F (36.8 C) (Oral)    Resp 17    SpO2 100%  If your blood pressure (BP) was elevated above 135/85 this visit, please have this repeated by your doctor within one month.

## 2018-06-04 NOTE — ED Notes (Signed)
Patient transported to X-ray 

## 2018-06-04 NOTE — ED Notes (Signed)
ED Provider at bedside. 

## 2018-06-04 NOTE — ED Provider Notes (Signed)
[facility] EMERGENCY DEPARTMENT Provider Note  CSN: [id number] Arrival date & time: 06/04/18  1134    History   Chief Complaint Chief Complaint  Patient presents with  . Fatigue    HPI Jacqueline Ball is a 77 y.o. female with a medical history of Alzheimer's, a-fib, Type 2 DM, HTN and HLD who presented to the ED for fatigue x3 months. Patient has no other physical complaints. Denies any sick contacts, recent illnesses, travel or new medications or exposures prior to the fatigue. Denies chest pain, SOB, orthopnea, lightheadedness, dizziness, abdominal pain, upper respiratory symptoms or headache. Patient has tried nothing prior to coming to the ED.   She endorses positive sleep, appetite and activity. She states that she lives with her husband and adult daughter and states she is fairly active and independent. Patient reports increased stress and endorses feeling depressed because of this. 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 Based on her neurologist notes, it is unclear how accurate her social history is given her advanced dementia.  Past Medical History:  Diagnosis Date  . Anxiety   . Atrial fibrillation (HCC)   . Diabetes mellitus   . Hyperlipidemia   . Hypertension   . Memory loss   . Mood disorder (HCC)   . Physical exam, annual 10/22/06  . Renal cyst     Patient Active Problem List   Diagnosis Date Noted  . Chronic anticoagulation 03/18/2018  . Late onset Alzheimer's disease without behavioral disturbance 01/21/2017  . Chronic atrial fibrillation (HCC) 11/02/2015  . Essential hypertension 11/02/2015  . Type 2 diabetes mellitus without complication, without long-term current use of insulin (HCC) 11/02/2015  . Cognitive impairment 10/31/2015  . Chest pain 11/22/2014  . Muscle cramps 04/17/2012  . Bilateral shoulder pain 03/09/2012  . Essential hypertension 03/28/2011  . Diabetes mellitus type 2, noninsulin dependent (HCC)  03/28/2011  . 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 ABDOMINAL HYSTERECTOMY       OB History   None      Home Medications    Prior to Admission medications   Medication Sig Start Date End Date Taking? Authorizing Provider  acetaminophen (TYLENOL) 500 MG tablet Take 500 mg by mouth every 6 (six) hours as needed for pain.    [provider]  apixaban (ELIQUIS) 5 MG TABS tablet Take 1 tablet (5 mg total) by mouth 2 (two) times daily. 11/24/14   Rinaldo Cloud, MD  Cholecalciferol (VITAMIN D-3) 5000 units TABS Take 5,000 Units by mouth daily.    [provider]  donepezil (ARICEPT) 10 MG tablet Take 1 tablet (10 mg total) by mouth at bedtime. 06/06/17   Marvel Plan, MD  esomeprazole (NEXIUM) 40 MG capsule Take 40 mg by mouth daily at 12 noon.    [provider]  estradiol (CLIMARA) 0.06 MG/24HR Place 0.06 patches onto the skin once a week. 08/12/17   [provider]  glimepiride (AMARYL) 4 MG tablet Take 4 mg by mouth daily with breakfast.    [provider]  IRON PO Take by mouth.    [provider]  meloxicam (MOBIC) 7.5 MG tablet Take 7.5 mg by mouth daily.    [provider]  memantine (NAMENDA TITRATION PAK) tablet pack 5 mg/day for =1 week; 5 mg twice daily for =1 week; 15 mg/day given in 5 mg and 10 mg separated doses for =1 week; then 10 mg twice daily 03/18/18  Marvel Plan, MD  memantine (NAMENDA) 10 MG tablet Take 1 tablet (10 mg total) by mouth 2 (two) times daily. After one month of titrating package. 04/16/18   Marvel Plan, MD  metFORMIN (GLUCOPHAGE) 500 MG tablet Take 500 mg by mouth daily after supper.  08/23/15   [provider]  metoprolol succinate (TOPROL-XL) 25 MG 24 hr tablet Take 12.5 mg by mouth daily.    [provider]  VESICARE 10 MG tablet  01/17/18   [provider]    Family History Family History  Problem Relation Age of  Onset  . Diabetes Sister     Social History Social History   Tobacco Use  . Smoking status: Never Smoker  . Smokeless tobacco: Never Used  Substance Use Topics  . Alcohol use: No  . Drug use: No     Allergies   Metoprolol   Review of Systems Review of Systems  Constitutional: Positive for fatigue. Negative for activity change, appetite change, chills and fever.  HENT: Negative for congestion, rhinorrhea and sore throat.   Eyes: Negative for pain and visual disturbance.  Respiratory: Negative for cough, chest tightness and shortness of breath.   Cardiovascular: Negative for chest pain, palpitations and leg swelling.  Gastrointestinal: Negative for abdominal pain, blood in stool, constipation, diarrhea, nausea and vomiting.  Endocrine: Negative.   Musculoskeletal: Negative.   Skin: Negative.   Neurological: Negative for dizziness, syncope, facial asymmetry, speech difficulty, weakness, light-headedness, numbness and headaches.  Hematological: Negative.   Psychiatric/Behavioral: Positive for confusion. Negative for behavioral problems and sleep disturbance.     Physical Exam Updated Vital Signs BP (!) 142/75   Pulse 60   Temp 98.3 F (36.8 C) (Oral)   Resp 17   SpO2 100%   Physical Exam  Constitutional: She appears well-developed and well-nourished.  HENT:  Right Ear: External ear normal.  Left Ear: External ear normal.  Mouth/Throat: Oropharynx is clear and moist.  Eyes: Pupils are equal, round, and reactive to light. Conjunctivae and EOM are normal.  Cardiovascular: Normal rate, regular rhythm, normal heart sounds and intact distal pulses.  Pulmonary/Chest: Effort normal and breath sounds normal.  Abdominal: Soft. Bowel sounds are normal. She exhibits no distension. There is no tenderness.  Neurological: She is alert. She has normal strength. No sensory deficit. She exhibits normal muscle tone.  Skin: Skin is warm. Capillary refill takes less than 2 seconds.    Psychiatric: She has a normal mood and affect. Her behavior is normal. Judgment and thought content normal. She is not actively hallucinating. Cognition and memory are impaired. She exhibits abnormal remote memory. She is attentive.  Nursing note and vitals reviewed.    ED Treatments / Results  Labs (all labs ordered are listed, but only abnormal results are displayed) Labs Reviewed  BASIC METABOLIC PANEL - Abnormal; Notable for the following components:      Result Value   Glucose, Bld 118 (*)    All other components within normal limits  CBC  URINALYSIS, ROUTINE W REFLEX MICROSCOPIC  TSH  T4, FREE  I-STAT TROPONIN, ED    EKG EKG Interpretation  Date/Time:  Wednesday June 04 2018 13:46:50 EDT Ventricular Rate:  61 PR Interval:    QRS Duration: 88 QT Interval:  433 QTC Calculation: 437 R Axis:   18 Text Interpretation:  Sinus rhythm Nonspecific T abnrm, anterolateral leads no significant change since september 2018 Confirmed by Marily Memos [phone]) on 06/04/2018 1:52:19 PM   Radiology Dg Chest 2  View  Result Date: 06/04/2018 CLINICAL DATA:  Fatigue, atrial fibrillation, diabetes, hypertension EXAM: CHEST - 2 VIEW COMPARISON:  01/25/2017 FINDINGS: Normal heart size, mediastinal contours, and pulmonary vascularity. Minimal subsegmental atelectasis LEFT base. Lungs otherwise clear. No pleural effusion or pneumothorax. Bones unremarkable. IMPRESSION: Minimal LEFT basilar subsegmental atelectasis. Electronically Signed   By: Ulyses SouthwardMark  Boles M.D.   On: 06/04/2018 14:32    Procedures Procedures (including critical care time)  Medications Ordered in ED Medications  ondansetron (ZOFRAN-ODT) disintegrating tablet 4 mg (4 mg Oral Refused 06/04/18 1517)     Initial Impression / Assessment and Plan / ED Course  Triage vital signs and the nursing notes have been reviewed.  Pertinent labs & imaging results that were available during care of the patient were reviewed and considered in  medical decision making (see chart for details).   Patient presents in no acute distress and is well appearing. Patient endorses fatigue x3 months with no other complaints or associated triggers. Physical exam is normal which is reassuring and helpful in ruling out an acute process. Initial labs to evaluate for infectious process and acute cardiac event are also normal and is further backed up by an unchanged EKG and normal troponin. No focal neuro deficits on physical exam and lack of neuro s/s in history is useful in ruling out a stroke/TIA.  Awaiting results from UA and TSH/free T4 which could be contributing to symptoms. Case discussed and handed off to SPX CorporationMichael Maczis, PA-C at shift change. Patient is well enough to do PO antibiotics if she has a UTI and can be followed as an outpatient for any abnormal thyroid findings.  Final Clinical Impressions(s) / ED Diagnoses  1. Fatigue. Unknown etiology. Advise patient to follow-up with PCP.  Dispo: Home. Case discussed with Leary RocaMichael Maczis, PA-C at sign out.  Final diagnoses:  Fatigue, unspecified type    ED Discharge Orders    None       Reva BoresMortis, Gabrielle I, PA-C 06/04/18 1636    Mesner, Barbara CowerJason, MD 06/05/18 [phone]

## 2018-06-04 NOTE — ED Triage Notes (Signed)
Pt presents for evaluation of ongoing fatigue. States has felt this way for months and just keeps going. States has seen PCP for fatigue as well. Denies cp/sob. Denies n/v/d.

## 2018-06-04 NOTE — ED Provider Notes (Signed)
Care assumed from [facility] Surger CenterGabrielle Mortis, PA-C at shift change with UA and TSH pending. Please see her note for further details.   In brief, this patient is a 77 y.o. female presenting with several months of fatigue.  She has been seen by her PCP for this as well.  She denies any other symptoms at this current time.  Her vital signs are stable.  She is awaiting lab work and UA  PLAN: Follow up on UA and TSH, T4.  If UA shows evidence of UTI plan for Rocephin in the department and discharged home on Keflex.  If TSH is abnormal follow up with results with PCP. Plan for discharge home and follow up with PCP.   MDM:  Labs, EKG, Chest xray and labs so far reviewed and reassuring so far.   UA without evidence of infection. TSH wnl. T4 slightly below normal limits.  She is to follow with PCP regarding today's results.  Discussed results with patient.  She is comfortable going home at this time. I advised the patient to follow-up with PCP this week. Specific return precautions discussed. Time was given for all questions to be answered. The patient verbalized understanding and agreement with plan. The patient appears safe for discharge home.  1. Fatigue, unspecified type       Jacqueline Ball, Jacqueline Ball M, PA-C 06/04/18 1738    Mesner, Barbara CowerJason, MD 06/05/18 [phone]

## 2018-06-04 NOTE — ED Notes (Signed)
Pt aware of need for urine sample. Will attempt to provide one after she gets back from her x-ray.

## 2018-07-18 ENCOUNTER — Ambulatory Visit: Payer: Medicare Other | Admitting: Adult Health

## 2018-07-18 ENCOUNTER — Other Ambulatory Visit: Payer: Self-pay | Admitting: Neurology

## 2018-07-24 ENCOUNTER — Other Ambulatory Visit: Payer: Self-pay | Admitting: Neurology

## 2018-09-15 ENCOUNTER — Ambulatory Visit (HOSPITAL_COMMUNITY)
Admission: EM | Admit: 2018-09-15 | Discharge: 2018-09-15 | Disposition: A | Discharge status: Home / Self Care | Payer: Medicare Other | Attending: Family Medicine | Admitting: Family Medicine

## 2018-09-15 ENCOUNTER — Encounter (HOSPITAL_COMMUNITY): Payer: Self-pay | Admitting: Emergency Medicine

## 2018-09-15 DIAGNOSIS — R51 Headache: Secondary | ICD-10-CM

## 2018-09-15 DIAGNOSIS — R519 Headache, unspecified: Secondary | ICD-10-CM

## 2018-09-15 MED ORDER — FLUTICASONE PROPIONATE 50 MCG/ACT NA SUSP: 1.0000 | Freq: Every day | NASAL | 2 refills | Status: DC

## 2018-09-15 MED ORDER — AMOXICILLIN-POT CLAVULANATE 875-125 MG PO TABS: 1.0000 | ORAL_TABLET | Freq: Two times a day (BID) | ORAL | 0 refills | Status: DC

## 2018-09-15 NOTE — ED Triage Notes (Signed)
Pt staets a few weeks ago she slipped and fell and injured her tailbone, pt states its still been hurting. ambulatory with steady gait. Pt also c/o sinus pain.

## 2018-09-15 NOTE — ED Provider Notes (Signed)
[facility]    CSN: [id number] Arrival date & time: 09/15/18  1101     History   Chief Complaint Chief Complaint  Patient presents with  . Tailbone Pain  . Headache    HPI Jacqueline Ball is a 77 y.o. female.   Patient is a 77 year old female with past medical history of anxiety, A. fib, diabetes, hypertension, memory loss that presents with chronic sinus headache and tailbone pain.  These 2 problems have been ongoing for months.  She was seen on 07/26/2018 for sinusitis.  She was then told to follow-up with ear nose and throat physician.  She reports she has not had any relief of symptoms of sinus congestion and sinus headaches since.  This problem is persistent every day. She has been using nasal spray and allergy medication daily. She reports that she was prescribed an abx previously but unable to find that in her history.  She denies any associated fever, chills, body aches, fatigue, cough, congestion.    The tailbone pain started after a fall that occurred a couple months ago.  She has not had no problem with ambulation or everyday activities.  She has been taking Tylenol for pain with some relief.  The pain is worse with sitting. No weakness in extremities, numbness or tingling. No loss of bowel or bladder function.   ROS per HPI      Past Medical History:  Diagnosis Date  . Anxiety   . Atrial fibrillation (HCC)   . Diabetes mellitus   . Hyperlipidemia   . Hypertension   . Memory loss   . Mood disorder (HCC)   . Physical exam, annual 10/22/06  . Renal cyst     Patient Active Problem List   Diagnosis Date Noted  . Chronic anticoagulation 03/18/2018  . 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 Chronic atrial fibrillation 11/02/2015  . Essential hypertension 11/02/2015  . Type 2 diabetes mellitus without complication, without long-term current use of insulin (HCC) 11/02/2015  . Cognitive impairment 10/31/2015  . Chest  pain 11/22/2014  . Muscle cramps 04/17/2012  . Bilateral shoulder pain 03/09/2012  . Essential hypertension 03/28/2011  . Diabetes mellitus type 2, noninsulin dependent (HCC) 03/28/2011  . 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 ABDOMINAL HYSTERECTOMY      OB History   None      Home Medications    Prior to Admission medications   Medication Sig Start Date End Date Taking? Authorizing Provider  acetaminophen (TYLENOL) 500 MG tablet Take 500 mg by mouth every 6 (six) hours as needed for pain.    [provider]  amoxicillin-clavulanate (AUGMENTIN) 875-125 MG tablet Take 1 tablet by mouth every 12 (twelve) hours. 09/15/18   Dahlia Byes A, NP  apixaban (ELIQUIS) 5 MG TABS tablet Take 1 tablet (5 mg total) by mouth 2 (two) times daily. 11/24/14   Rinaldo Cloud, MD  atorvastatin (LIPITOR) 40 MG tablet Take 40 mg by mouth at bedtime.    [provider]  azelastine (ASTELIN) 0.1 % nasal spray Place 1 spray into both nostrils as needed. 05/24/18   [provider]  donepezil (ARICEPT) 10 MG tablet Take 1 tablet (10 mg total) by mouth at bedtime. 06/06/17   Marvel Plan, MD  esomeprazole (NEXIUM) 40 MG capsule Take 40 mg by mouth daily at 12 noon.    [provider]  estradiol (CLIMARA) 0.06 MG/24HR Place 0.06  patches onto the skin once a week. 08/12/17   [provider]  fluticasone (FLONASE) 50 MCG/ACT nasal spray Place 1 spray into both nostrils daily. 09/15/18   Irving Lubbers, Gloris Manchester A, NP  glimepiride (AMARYL) 4 MG tablet Take 4 mg by mouth daily with breakfast.    [provider]  meloxicam (MOBIC) 7.5 MG tablet Take 7.5 mg by mouth daily.    [provider]  memantine (NAMENDA TITRATION PAK) tablet pack 5 mg/day for =1 week; 5 mg twice daily for =1 week; 15 mg/day given in 5 mg and 10 mg separated doses for =1 week; then 10 mg twice daily Patient not taking: Reported on 06/04/2018 03/18/18    Marvel Plan, MD  memantine (NAMENDA) 10 MG tablet Take 1 tablet (10 mg total) by mouth 2 (two) times daily. After one month of titrating package. Patient taking differently: Take 10 mg by mouth 2 (two) times daily.  04/16/18   Marvel Plan, MD  metFORMIN (GLUCOPHAGE) 500 MG tablet Take 500 mg by mouth 2 (two) times daily with a meal.  08/23/15   [provider]  VESICARE 10 MG tablet  01/17/18   [provider]    Family History Family History  Problem Relation Age of Onset  . Diabetes Sister     Social History Social History   Tobacco Use  . Smoking status: Never Smoker  . Smokeless tobacco: Never Used  Substance Use Topics  . Alcohol use: No  . Drug use: No     Allergies   Metoprolol   Review of Systems Review of Systems   Physical Exam Triage Vital Signs ED Triage Vitals [09/15/18 1214]  Enc Vitals Group     BP 136/87     Pulse Rate 60     Resp 18     Temp (!) 97.4 F (36.3 C)     Temp src      SpO2 100 %     Weight      Height      Head Circumference      Peak Flow      Pain Score      Pain Loc      Pain Edu?      Excl. in GC?    No data found.  Updated Vital Signs BP 136/87   Pulse 60   Temp (!) 97.4 F (36.3 C)   Resp 18   SpO2 100%   Visual Acuity Right Eye Distance:   Left Eye Distance:   Bilateral Distance:    Right Eye Near:   Left Eye Near:    Bilateral Near:     Physical Exam  Constitutional: She appears well-developed and well-nourished.  Very pleasant. Non toxic or ill appearing.      HENT:  Bilateral TMs normal.  External ears normal.  Without posterior oropharyngeal erythema, tonsillar swelling or exudates. No lesions.  Moderate nasal turbinate swelling.  Moderate tenderness to frontal sinuses     Eyes: Pupils are equal, round, and reactive to light. EOM are normal.  Neck: Normal range of motion.  Cardiovascular: Normal rate and normal heart sounds.  Pulmonary/Chest: Effort normal and breath sounds  normal.  Abdominal: Soft.  Musculoskeletal: Normal range of motion.  Mild bony tenderness to coccyx. No bruising, swelling or deformity.   Neurological: She is alert. She has normal strength.  Skin: Skin is warm and dry.  Nursing note and vitals reviewed.    UC Treatments / Results  Labs (all  labs ordered are listed, but only abnormal results are displayed) Labs Reviewed - No data to display  EKG None  Radiology No results found.  Procedures Procedures (including critical care time)  Medications Ordered in UC Medications - No data to display  Initial Impression / Assessment and Plan / UC Course  I have reviewed the triage vital signs and the nursing notes.  Pertinent labs & imaging results that were available during my care of the patient were reviewed by me and considered in my medical decision making (see chart for details).     Sinusitis- will go ahead and treat with abx to see if this helps. Instructed to keep using the nasal spray, recommended Flonase, and allergy medication. Saline rinses.  She needs to follow up with ENT, gave her a contact to call.   tylenol for the coccyx pain, no need for xray.  Follow up with PCP as needed for continued or worsening symptoms.  Final Clinical Impressions(s) / UC Diagnoses   Final diagnoses:  Sinus headache     Discharge Instructions     It was nice meeting you!!  We will go ahead and treat you for a sinus infection Flonase could help. I am not sure if you are using this, I did not see it in your medication list.  Saline rinses could help Tylenol extra strength could help with the tail bone pain.     ED Prescriptions    Medication Sig Dispense Auth. Provider   amoxicillin-clavulanate (AUGMENTIN) 875-125 MG tablet Take 1 tablet by mouth every 12 (twelve) hours. 14 tablet Fatou Dunnigan A, NP   fluticasone (FLONASE) 50 MCG/ACT nasal spray Place 1 spray into both nostrils daily. 16 g Dahlia Byes A, NP     Controlled  Substance Prescriptions [place] Controlled Substance Registry consulted? Not Applicable   Janace Aris, NP 09/16/18 [phone]

## 2018-09-15 NOTE — Discharge Instructions (Addendum)
It was nice meeting you!!  We will go ahead and treat you for a sinus infection Flonase could help. I am not sure if you are using this, I did not see it in your medication list.  Saline rinses could help Tylenol extra strength could help with the tail bone pain.

## 2018-11-04 ENCOUNTER — Encounter (HOSPITAL_COMMUNITY): Payer: Self-pay

## 2018-11-04 ENCOUNTER — Emergency Department (HOSPITAL_COMMUNITY)
Admission: EM | Admit: 2018-11-04 | Discharge: 2018-11-04 | Disposition: A | Discharge status: Home / Self Care | Payer: Medicare PPO | Attending: Emergency Medicine | Admitting: Emergency Medicine

## 2018-11-04 DIAGNOSIS — M25511 Pain in right shoulder: Secondary | ICD-10-CM | POA: Insufficient documentation

## 2018-11-04 DIAGNOSIS — M542 Cervicalgia: Secondary | ICD-10-CM | POA: Diagnosis not present

## 2018-11-04 DIAGNOSIS — I10 Essential (primary) hypertension: Secondary | ICD-10-CM | POA: Insufficient documentation

## 2018-11-04 DIAGNOSIS — Z7984 Long term (current) use of oral hypoglycemic drugs: Secondary | ICD-10-CM | POA: Diagnosis not present

## 2018-11-04 DIAGNOSIS — Z7901 Long term (current) use of anticoagulants: Secondary | ICD-10-CM | POA: Insufficient documentation

## 2018-11-04 DIAGNOSIS — M436 Torticollis: Secondary | ICD-10-CM | POA: Insufficient documentation

## 2018-11-04 DIAGNOSIS — Z79899 Other long term (current) drug therapy: Secondary | ICD-10-CM | POA: Diagnosis not present

## 2018-11-04 DIAGNOSIS — E119 Type 2 diabetes mellitus without complications: Secondary | ICD-10-CM | POA: Insufficient documentation

## 2018-11-04 DIAGNOSIS — G309 Alzheimer's disease, unspecified: Secondary | ICD-10-CM | POA: Diagnosis not present

## 2018-11-04 MED ORDER — METHOCARBAMOL 500 MG PO TABS: 500.0000 mg | ORAL_TABLET | Freq: Two times a day (BID) | ORAL | 0 refills | Status: AC

## 2018-11-04 MED ORDER — METHOCARBAMOL 500 MG PO TABS
500.0000 mg | ORAL_TABLET | Freq: Once | ORAL | Status: AC
  Administered 2018-11-04: 500 mg via ORAL
  Filled 2018-11-04: qty 1

## 2018-11-04 NOTE — ED Notes (Signed)
Pt reports no relief with Robaxin

## 2018-11-04 NOTE — Discharge Instructions (Signed)
I have prescribed muscle relaxers for your pain, please do not drink or drive while taking this medications as they can make you drowsy.    Please follow-up with PCP in 1 week for reevaluation of your symptoms.   

## 2018-11-04 NOTE — ED Notes (Signed)
Pt stable, ambulatory, states understanding of discharge instructions 

## 2018-11-04 NOTE — ED Provider Notes (Signed)
[facility] Provider Note   CSN: [id number] Arrival date & time: 11/04/18  1901     History   Chief Complaint Chief Complaint  Patient presents with  . Neck Pain    HPI Jacqueline Ball is a 77 y.o. female.  77 y/o female with a PMH of Anxiety, DM, Afib presents to the ED with a chief complaint of neck pain x 2 days. Patient reports the pain is located along her right shoulder and lower neck region. She reports the pain is worse with movement and with pressure. Patient has tried taking tylenol but reports no relieve in symptoms. She denies any fever, weakness, chest pain or shortness of breath.      Past Medical History:  Diagnosis Date  . Anxiety   . Atrial fibrillation (HCC)   . Diabetes mellitus   . Hyperlipidemia   . Hypertension   . Memory loss   . Mood disorder (HCC)   . Physical exam, annual 10/22/06  . Renal cyst     Patient Active Problem List   Diagnosis Date Noted  . Chronic anticoagulation 03/18/2018  . 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 Chronic atrial fibrillation 11/02/2015  . Essential hypertension 11/02/2015  . Type 2 diabetes mellitus without complication, without long-term current use of insulin (HCC) 11/02/2015  . Cognitive impairment 10/31/2015  . Chest pain 11/22/2014  . Muscle cramps 04/17/2012  . Bilateral shoulder pain 03/09/2012  . Essential hypertension 03/28/2011  . Diabetes mellitus type 2, noninsulin dependent (HCC) 03/28/2011  . 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 ABDOMINAL HYSTERECTOMY       OB History   None      Home Medications    Prior to Admission medications   Medication Sig Start Date End Date Taking? Authorizing Provider  acetaminophen (TYLENOL) 500 MG tablet Take 500 mg by mouth every 6 (six) hours as needed for pain.    [provider]  amoxicillin-clavulanate  (AUGMENTIN) 875-125 MG tablet Take 1 tablet by mouth every 12 (twelve) hours. 09/15/18   Dahlia ByesBast, Traci A, NP  apixaban (ELIQUIS) 5 MG TABS tablet Take 1 tablet (5 mg total) by mouth 2 (two) times daily. 11/24/14   Rinaldo CloudHarwani, Mohan, MD  atorvastatin (LIPITOR) 40 MG tablet Take 40 mg by mouth at bedtime.    [provider]  azelastine (ASTELIN) 0.1 % nasal spray Place 1 spray into both nostrils as needed. 05/24/18   [provider]  donepezil (ARICEPT) 10 MG tablet Take 1 tablet (10 mg total) by mouth at bedtime. 06/06/17   Marvel PlanXu, Jindong, MD  esomeprazole (NEXIUM) 40 MG capsule Take 40 mg by mouth daily at 12 noon.    [provider]  estradiol (CLIMARA) 0.06 MG/24HR Place 0.06 patches onto the skin once a week. 08/12/17   [provider]  fluticasone (FLONASE) 50 MCG/ACT nasal spray Place 1 spray into both nostrils daily. 09/15/18   Bast, Gloris Manchesterraci A, NP  glimepiride (AMARYL) 4 MG tablet Take 4 mg by mouth daily with breakfast.    [provider]  meloxicam (MOBIC) 7.5 MG tablet Take 7.5 mg by mouth daily.    [provider]  memantine (NAMENDA TITRATION PAK) tablet pack 5 mg/day for =1 week; 5 mg twice daily for =1 week; 15 mg/day given in 5 mg and 10 mg separated doses for =1 week; then 10 mg twice daily Patient not taking:  Reported on 06/04/2018 03/18/18   Marvel Plan, MD  memantine (NAMENDA) 10 MG tablet Take 1 tablet (10 mg total) by mouth 2 (two) times daily. After one month of titrating package. Patient taking differently: Take 10 mg by mouth 2 (two) times daily.  04/16/18   Marvel Plan, MD  metFORMIN (GLUCOPHAGE) 500 MG tablet Take 500 mg by mouth 2 (two) times daily with a meal.  08/23/15   [provider]  VESICARE 10 MG tablet  01/17/18   [provider]    Family History Family History  Problem Relation Age of Onset  . Diabetes Sister     Social History Social History   Tobacco Use  . Smoking status: Never Smoker  . Smokeless  tobacco: Never Used  Substance Use Topics  . Alcohol use: No  . Drug use: No     Allergies   Metoprolol   Review of Systems Review of Systems  Constitutional: Negative for chills and fever.  Musculoskeletal: Positive for neck pain and neck stiffness.  Neurological: Negative for headaches.     Physical Exam Updated Vital Signs BP (!) 176/101 (BP Location: Right Arm)   Pulse 77   Temp 98.5 F (36.9 C) (Oral)   Resp 17   SpO2 100%   Physical Exam  Constitutional: She is oriented to person, place, and time. She appears well-developed and well-nourished. No distress.  HENT:  Head: Normocephalic and atraumatic.  Mouth/Throat: Oropharynx is clear and moist. No oropharyngeal exudate.  Eyes: Pupils are equal, round, and reactive to light.  Neck: Muscular tenderness present. No spinous process tenderness present. Decreased range of motion present.    Cardiovascular: Regular rhythm and normal heart sounds.  Pulmonary/Chest: Effort normal and breath sounds normal. No respiratory distress.  Abdominal: Soft. Bowel sounds are normal. She exhibits no distension. There is no tenderness.  Musculoskeletal: She exhibits no tenderness or deformity.       Right lower leg: She exhibits no edema.       Left lower leg: She exhibits no edema.  Neurological: She is alert and oriented to person, place, and time.  Skin: Skin is warm and dry.  Psychiatric: She has a normal mood and affect.  Nursing note and vitals reviewed.    ED Treatments / Results  Labs (all labs ordered are listed, but only abnormal results are displayed) Labs Reviewed - No data to display  EKG None  Radiology No results found.  Procedures Procedures (including critical care time)  Medications Ordered in ED Medications  methocarbamol (ROBAXIN) tablet 500 mg (500 mg Oral Given 11/04/18 1933)     Initial Impression / Assessment and Plan / ED Course  I have reviewed the triage vital signs and the nursing  notes.  Pertinent labs & imaging results that were available during my care of the patient were reviewed by me and considered in my medical decision making (see chart for details).    Presents with neck pain which began 2 days ago.  She reports decrease range of motion as she is unable to turn her neck towards the right or left.  She has tried Tylenol but states no relieving symptoms.  During evaluation patient is tender to palpation along the right shoulder and right scapula denies any hand tingling or shortness of breath or chest pain.  Robaxin 500 mg given to patient while in the ED.  Patient able to move her neck from right to left without limitation.  Reports the medication seems to be  helping.  This patient for acute torticollis as pain is reproducible with palpation and has been improved by a muscle relaxer.  I have discussed with patient the use of muscle relaxers and will send her home with a prescription, she is instructed to follow-up with her primary care physician as needed.  Return precautions have been explained to patient at length.  At the bedside requesting an x-ray however patient denies any trauma and has no midline tenderness, low suspicion for any acute fracture or dislocation as patient has full range of motion of her right shoulder.  Vitals stable for discharge.  Final Clinical Impressions(s) / ED Diagnoses   Final diagnoses:  Neck pain  Torticollis, acute    ED Discharge Orders    None       Claude Manges, PA-C 11/04/18 2153    Pricilla Loveless, MD 11/12/18 1718

## 2018-11-04 NOTE — ED Triage Notes (Signed)
Neck pain for a couple days denies injury

## 2018-11-21 ENCOUNTER — Encounter (HOSPITAL_COMMUNITY): Payer: Self-pay | Admitting: Emergency Medicine

## 2018-11-21 ENCOUNTER — Emergency Department (HOSPITAL_COMMUNITY)
Admission: EM | Admit: 2018-11-21 | Discharge: 2018-11-21 | Disposition: A | Discharge status: Home / Self Care | Payer: Medicare PPO | Attending: Emergency Medicine | Admitting: Emergency Medicine

## 2018-11-21 DIAGNOSIS — Z7984 Long term (current) use of oral hypoglycemic drugs: Secondary | ICD-10-CM | POA: Diagnosis not present

## 2018-11-21 DIAGNOSIS — Z79899 Other long term (current) drug therapy: Secondary | ICD-10-CM | POA: Diagnosis not present

## 2018-11-21 DIAGNOSIS — G309 Alzheimer's disease, unspecified: Secondary | ICD-10-CM | POA: Diagnosis not present

## 2018-11-21 DIAGNOSIS — Z7901 Long term (current) use of anticoagulants: Secondary | ICD-10-CM | POA: Diagnosis not present

## 2018-11-21 DIAGNOSIS — R102 Pelvic and perineal pain: Secondary | ICD-10-CM | POA: Diagnosis present

## 2018-11-21 DIAGNOSIS — B9689 Other specified bacterial agents as the cause of diseases classified elsewhere: Secondary | ICD-10-CM | POA: Insufficient documentation

## 2018-11-21 DIAGNOSIS — N760 Acute vaginitis: Secondary | ICD-10-CM | POA: Diagnosis not present

## 2018-11-21 DIAGNOSIS — N3001 Acute cystitis with hematuria: Secondary | ICD-10-CM | POA: Diagnosis not present

## 2018-11-21 DIAGNOSIS — E119 Type 2 diabetes mellitus without complications: Secondary | ICD-10-CM | POA: Diagnosis not present

## 2018-11-21 LAB — CBC WITH DIFFERENTIAL/PLATELET
Abs Immature Granulocytes: 0.02 10*3/uL (ref 0.00–0.07)
BASOS ABS: 0 10*3/uL (ref 0.0–0.1)
Basophils Relative: 0 %
EOS ABS: 0 10*3/uL (ref 0.0–0.5)
EOS PCT: 0 %
HEMATOCRIT: 39.6 % (ref 36.0–46.0)
Hemoglobin: 12.5 g/dL (ref 12.0–15.0)
Immature Granulocytes: 0 %
Lymphocytes Relative: 25 %
Lymphs Abs: 2.5 10*3/uL (ref 0.7–4.0)
MCH: 30.9 pg (ref 26.0–34.0)
MCHC: 31.6 g/dL (ref 30.0–36.0)
MCV: 97.8 fL (ref 80.0–100.0)
Monocytes Absolute: 1 10*3/uL (ref 0.1–1.0)
Monocytes Relative: 10 %
NRBC: 0 % (ref 0.0–0.2)
Neutro Abs: 6.5 10*3/uL (ref 1.7–7.7)
Neutrophils Relative %: 65 %
Platelets: 311 10*3/uL (ref 150–400)
RBC: 4.05 MIL/uL (ref 3.87–5.11)
RDW: 12.5 % (ref 11.5–15.5)
WBC: 10.1 10*3/uL (ref 4.0–10.5)

## 2018-11-21 LAB — BASIC METABOLIC PANEL
ANION GAP: 10 (ref 5–15)
BUN: 20 mg/dL (ref 8–23)
CALCIUM: 9.4 mg/dL (ref 8.9–10.3)
CO2: 24 mmol/L (ref 22–32)
Chloride: 102 mmol/L (ref 98–111)
Creatinine, Ser: 1.09 mg/dL — ABNORMAL HIGH (ref 0.44–1.00)
GFR calc Af Amer: 57 mL/min — ABNORMAL LOW (ref >60)
GFR, EST NON AFRICAN AMERICAN: 49 mL/min — AB (ref >60)
GLUCOSE: 224 mg/dL — AB (ref 70–99)
Potassium: 4 mmol/L (ref 3.5–5.1)
Sodium: 136 mmol/L (ref 135–145)

## 2018-11-21 LAB — URINALYSIS, ROUTINE W REFLEX MICROSCOPIC
Bilirubin Urine: NEGATIVE
Glucose, UA: NEGATIVE mg/dL
Ketones, ur: NEGATIVE mg/dL
Nitrite: POSITIVE — AB
Protein, ur: 100 mg/dL — AB
RBC / HPF: >50 RBC/hpf — ABNORMAL HIGH (ref 0–5)
Specific Gravity, Urine: 1.018 (ref 1.005–1.030)
WBC, UA: >50 WBC/hpf — ABNORMAL HIGH (ref 0–5)
pH: 6 (ref 5.0–8.0)

## 2018-11-21 LAB — WET PREP, GENITAL
Sperm: NONE SEEN
Trich, Wet Prep: NONE SEEN
Yeast Wet Prep HPF POC: NONE SEEN

## 2018-11-21 MED ORDER — METRONIDAZOLE 500 MG PO TABS: 500.0000 mg | ORAL_TABLET | Freq: Two times a day (BID) | ORAL | 0 refills | Status: DC

## 2018-11-21 MED ORDER — SODIUM CHLORIDE 0.9 % IV SOLN
1.0000 g | Freq: Once | INTRAVENOUS | Status: AC
  Administered 2018-11-21: 1 g via INTRAVENOUS
  Filled 2018-11-21: qty 10

## 2018-11-21 MED ORDER — MORPHINE SULFATE (PF) 4 MG/ML IV SOLN
4.0000 mg | Freq: Once | INTRAVENOUS | Status: AC
  Administered 2018-11-21: 4 mg via INTRAVENOUS
  Filled 2018-11-21: qty 1

## 2018-11-21 MED ORDER — CEPHALEXIN 500 MG PO CAPS: 500.0000 mg | ORAL_CAPSULE | Freq: Three times a day (TID) | ORAL | 0 refills | Status: AC

## 2018-11-21 NOTE — Discharge Instructions (Addendum)
You were seen in the ER for vaginal pain, burning with urination and vaginal discharge.  You have a urinary tract infection and bacterial vaginosis.  Take antibiotics as prescribed.  Do not consume alcohol while taking the medicine.  You can take Tylenol as needed for pain.  Return to the ER for fever, worsening symptoms, abdominal pain or pain to your back, nausea, vomiting.

## 2018-11-21 NOTE — ED Triage Notes (Signed)
Pt c/o vaginal pain x 3 days, unkn discharge, denies bleeding.  +dysuria, pt feels like she is not emptying bladder.

## 2018-11-21 NOTE — ED Provider Notes (Signed)
[facility] EMERGENCY DEPARTMENT Provider Note   CSN: [id number] Arrival date & time: 11/21/18  1732     History   Chief Complaint Chief Complaint  Patient presents with  . Vaginal Pain    HPI Jacqueline Ball is a 77 y.o. female with history of Alzheimer's on aricept, atrial fibrillation on eloquis, amiodorone, DM on oral agents, HTN, urinary frequency and urge incontinence, recurring UTIs, is here for evaluation of vaginal pain.  Onset 2 days ago.  Pain is described as a "pulling sensation" inside her vagina.  This is worse with urination.  She has burning with urination as well.  Associated with subjective fevers, chills, nausea.  Has also noticed increased white/yellow vaginal discharge.  She denies any vomiting, chest pain, shortness of breath, cough, abdominal pain, vaginal bleeding, changes to bowel movements.  She is not sexually active.  Her symptoms today are similar to symptoms of previous UTIs.  She has no history of kidney stones.  Level 5 caveat applies given history of Alzheimer's however patient clinically appears to be a good historian, intermittently has difficulty with timeframe of symptoms. Daughter reports h/o hysterectomy.   HPI  Past Medical History:  Diagnosis Date  . Anxiety   . Atrial fibrillation (HCC)   . Diabetes mellitus   . Hyperlipidemia   . Hypertension   . Memory loss   . Mood disorder (HCC)   . Physical exam, annual 10/22/06  . Renal cyst     Patient Active Problem List   Diagnosis Date Noted  . Chronic anticoagulation 03/18/2018  . 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 Chronic atrial fibrillation 11/02/2015  . Essential hypertension 11/02/2015  . Type 2 diabetes mellitus without complication, without long-term current use of insulin (HCC) 11/02/2015  . Cognitive impairment 10/31/2015  . Chest pain 11/22/2014  . Muscle cramps 04/17/2012  . Bilateral shoulder pain 03/09/2012  .  Essential hypertension 03/28/2011  . Diabetes mellitus type 2, noninsulin dependent (HCC) 03/28/2011  . 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 ABDOMINAL HYSTERECTOMY       OB History   None      Home Medications    Prior to Admission medications   Medication Sig Start Date End Date Taking? Authorizing Provider  acetaminophen (TYLENOL) 500 MG tablet Take 500 mg by mouth every 6 (six) hours as needed for pain.   Yes [provider]  apixaban (ELIQUIS) 5 MG TABS tablet Take 1 tablet (5 mg total) by mouth 2 (two) times daily. 11/24/14  Yes Rinaldo Cloud, MD  atorvastatin (LIPITOR) 40 MG tablet Take 40 mg by mouth at bedtime.   Yes [provider]  azelastine (ASTELIN) 0.1 % nasal spray Place 1 spray into both nostrils as needed for rhinitis or allergies.  05/24/18  Yes [provider]  dicyclomine (BENTYL) 20 MG tablet Take 20 mg by mouth 3 (three) times daily as needed for spasms. 08/29/18  Yes [provider]  donepezil (ARICEPT) 10 MG tablet Take 1 tablet (10 mg total) by mouth at bedtime. 06/06/17  Yes Marvel Plan, MD  esomeprazole (NEXIUM) 40 MG capsule Take 40 mg by mouth daily at 12 noon.   Yes [provider]  estradiol (CLIMARA) 0.06 MG/24HR Place 0.06 patches onto the skin once a week. 08/12/17  Yes [provider]  fluticasone (FLONASE) 50 MCG/ACT nasal spray Place 1 spray into both nostrils daily. 09/15/18  Yes  Bast, Traci A, NP  glimepiride (AMARYL) 4 MG tablet Take 4 mg by mouth daily with breakfast.   Yes [provider]  meloxicam (MOBIC) 7.5 MG tablet Take 7.5 mg by mouth daily.   Yes [provider]  metFORMIN (GLUCOPHAGE) 500 MG tablet Take 500 mg by mouth 2 (two) times daily with a meal.  08/23/15  Yes [provider]  VESICARE 10 MG tablet Take 10 mg by mouth daily.  01/17/18  Yes [provider]  cephALEXin (KEFLEX) 500 MG capsule  Take 1 capsule (500 mg total) by mouth 3 (three) times daily for 7 days. 11/21/18 11/28/18  Liberty HandyGibbons,  J, PA-C  metroNIDAZOLE (FLAGYL) 500 MG tablet Take 1 tablet (500 mg total) by mouth 2 (two) times daily. 11/21/18   Liberty HandyGibbons,  J, PA-C    Family History Family History  Problem Relation Age of Onset  . Diabetes Sister     Social History Social History   Tobacco Use  . Smoking status: Never Smoker  . Smokeless tobacco: Never Used  Substance Use Topics  . Alcohol use: No  . Drug use: No     Allergies   Metoprolol   Review of Systems Review of Systems  Reason unable to perform ROS: alzheimer's.  Genitourinary: Positive for dysuria and vaginal pain.  All other systems reviewed and are negative.    Physical Exam Updated Vital Signs BP 114/70   Pulse 80   Temp 98.3 F (36.8 C) (Oral)   Resp 14   Ht 5\' 4"  (1.626 m)   Wt 70.8 kg   SpO2 97%   BMI 26.78 kg/m   Physical Exam  Constitutional: She is oriented to person, place, and time. She appears well-developed and well-nourished.  Non toxic  HENT:  Head: Normocephalic and atraumatic.  Nose: Nose normal.  Eyes: Pupils are equal, round, and reactive to light. Conjunctivae and EOM are normal.  Neck: Normal range of motion.  Cardiovascular: Normal rate and regular rhythm.  Pulmonary/Chest: Effort normal and breath sounds normal.  Abdominal: Soft. Bowel sounds are normal. There is no tenderness.  No G/R/R. No suprapubic or CVA tenderness. Negative Murphy's and McBurney's. Active BS to lower quadrants.   Genitourinary: Vaginal discharge found.  Genitourinary Comments:  Exam performed with EMT at bedside for assistance. External genitalia without lesions.  No groin lymphadenopathy.  Vaginal mucosa pink without lesions.  Scant clear/white, thin discharge in vaginal vault. Negative whiff test. No tenderness with bimanual exam.  Cervix not visualized. Nontender adnexa.  Perianal skin normal without lesions.    Musculoskeletal: Normal range of motion.  Neurological: She is alert and oriented to person, place, and time.  Skin: Skin is warm and dry. Capillary refill takes less than 2 seconds.  Psychiatric: She has a normal mood and affect. Her behavior is normal.  Nursing note and vitals reviewed.    ED Treatments / Results  Labs (all labs ordered are listed, but only abnormal results are displayed) Labs Reviewed  WET PREP, GENITAL - Abnormal; Notable for the following components:      Result Value   Clue Cells Wet Prep HPF POC PRESENT (*)    WBC, Wet Prep HPF POC FEW (*)    All other components within normal limits  URINALYSIS, ROUTINE W REFLEX MICROSCOPIC - Abnormal; Notable for the following components:   Color, Urine AMBER (*)    APPearance CLOUDY (*)    Hgb urine dipstick MODERATE (*)    Protein, ur 100 (*)  Nitrite POSITIVE (*)    Leukocytes, UA LARGE (*)    RBC / HPF >50 (*)    WBC, UA >50 (*)    Bacteria, UA MANY (*)    Non Squamous Epithelial 0-5 (*)    All other components within normal limits  BASIC METABOLIC PANEL - Abnormal; Notable for the following components:   Glucose, Bld 224 (*)    Creatinine, Ser 1.09 (*)    GFR calc non Af Amer 49 (*)    GFR calc Af Amer 57 (*)    All other components within normal limits  URINE CULTURE  CBC WITH DIFFERENTIAL/PLATELET  GC/CHLAMYDIA PROBE AMP ([place]) NOT AT [facility]    EKG None  Radiology No results found.  Procedures Procedures (including critical care time)  Medications Ordered in ED Medications  morphine 4 MG/ML injection 4 mg (4 mg Intravenous Given 11/21/18 2128)  cefTRIAXone (ROCEPHIN) 1 g in sodium chloride 0.9 % 100 mL IVPB (0 g Intravenous Stopped 11/21/18 2204)     Initial Impression / Assessment and Plan / ED Course  I have reviewed the triage vital signs and the nursing notes.  Pertinent labs & imaging results that were available during my care of the patient were reviewed by me and considered in  my medical decision making (see chart for details).    High suspicion for UTI.  Doubt pyelonephritis given physical exam.  Low suspicion for torsion or PID or pelvic abscess in this patient given her clinical presentation.  History of hysterectomy.  Urinalysis with positive nitrites which fits clinical presentation.  She has no suprapubic or CVA tenderness, fever, tachycardia.  WBC normal.  Creatinine normal.  Wet prep with clue cells, patient's report of increased vaginal discharge, I will treat for BV.  She was given Rocephin in the ER.  No indication for further emergent lab work or imaging, or admission.  No signs of pyelonephritis.  She does not meet sirs criteria.  We will discharge with Keflex, symptomatic management.  Return precautions given.  Patient is in agreement.  Final Clinical Impressions(s) / ED Diagnoses   Final diagnoses:  Acute cystitis with hematuria  BV (bacterial vaginosis)    ED Discharge Orders         Ordered    cephALEXin (KEFLEX) 500 MG capsule  3 times daily     11/21/18 2145    metroNIDAZOLE (FLAGYL) 500 MG tablet  2 times daily     11/21/18 2145           Liberty Handy, PA-C 11/22/18 0054    Raeford Razor, MD 11/22/18 1555

## 2018-11-23 LAB — URINE CULTURE: Culture: >=100000 — AB

## 2018-11-24 ENCOUNTER — Telehealth: Payer: Self-pay | Admitting: *Deleted

## 2018-11-24 LAB — GC/CHLAMYDIA PROBE AMP (~~LOC~~) NOT AT ARMC
Chlamydia: NEGATIVE
Neisseria Gonorrhea: NEGATIVE

## 2018-11-24 NOTE — Telephone Encounter (Signed)
Post ED Visit - Positive Culture Follow-up  Culture report reviewed by antimicrobial stewardship pharmacist:  []  Enzo BiNathan Batchelder, Pharm.D. []  Celedonio MiyamotoJeremy Frens, Pharm.D., BCPS AQ-ID []  Garvin FilaMike Maccia, Pharm.D., BCPS []  Georgina PillionElizabeth Martin, Pharm.D., BCPS []  BelhavenMinh Pham, [address] Rainbow BoulevardPharm.D., BCPS, AAHIVP []  Estella HuskMichelle Turner, Pharm.D., BCPS, AAHIVP [x]  Lysle Pearlachel Rumbarger, PharmD, BCPS []  Phillips Climeshuy Dang, PharmD, BCPS []  Agapito GamesAlison Masters, PharmD, BCPS []  Verlan FriendsErin Deja, PharmD  Positive urine culture Treated with Cephalexin, organism sensitive to the same and no further patient follow-up is required at this time.  Virl AxeRobertson, Karrie Fluellen [facility] [place] 11/24/2018, 9:42 AM

## 2018-12-25 ENCOUNTER — Ambulatory Visit: Payer: Medicare Other | Admitting: Adult Health

## 2018-12-25 ENCOUNTER — Telehealth: Payer: Self-pay
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
## 2018-12-25 NOTE — Telephone Encounter (Signed)
Patient no show for appt today. Per Misty StanleyLisa phone room pt forgot about the appt will r/s.

## 2018-12-26 ENCOUNTER — Ambulatory Visit: Payer: Medicare Other | Admitting: Adult Health

## 2018-12-26 ENCOUNTER — Encounter: Payer: Self-pay | Admitting: Adult Health

## 2018-12-26 VITALS — BP 140/68 | HR 76 | Ht 64.0 in | Wt 149.2 lb

## 2018-12-26 DIAGNOSIS — G301 Alzheimer's disease with late onset: Secondary | ICD-10-CM | POA: Diagnosis not present

## 2018-12-26 DIAGNOSIS — F0280 Dementia in other diseases classified elsewhere without behavioral disturbance: Secondary | ICD-10-CM

## 2018-12-26 NOTE — Patient Instructions (Addendum)
Your Plan:  Continue Aricept 10mg  daily for memory   Follow up in 6 months or call earlier if needed       Thank you for coming to see [place] at [facility]. I hope we have been able to provide you high quality care today.  You may receive a patient satisfaction survey over the next few weeks. We would appreciate your feedback and comments so that we may continue to improve ourselves and the health of our patients.
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
## 2018-12-29 DIAGNOSIS — Z0289 Encounter for other administrative examinations: Secondary | ICD-10-CM

## 2019-01-02 ENCOUNTER — Telehealth: Payer: Self-pay

## 2019-01-02 NOTE — Progress Notes (Signed)
I agree with the above plan 
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
## 2019-01-06 ENCOUNTER — Telehealth: Payer: Self-pay | Admitting: Adult Health

## 2019-01-06 NOTE — Telephone Encounter (Signed)
Patient's records have been faxed over to [facility] on 01/06/19.

## 2019-01-13 ENCOUNTER — Encounter: Payer: Self-pay | Admitting: Psychology

## 2019-01-14 ENCOUNTER — Telehealth: Payer: Self-pay | Admitting: Adult Health

## 2019-01-14 NOTE — Telephone Encounter (Signed)
Patient calling to get clarification of treatment plan. She wants to know where we are sending her & why. Please call

## 2019-01-14 NOTE — Telephone Encounter (Signed)
I return patients call about the treatment plan. Pt stated [name] referred her to a psychologist. She needed to know why and the reason. I explain when she saw [name] there were concerns of cognitive and memory issues. PT wanted the phone number of Dr. Kieth Brightly. I gave her the phone number of the psychologist> Pt has the appt time and date. Pt had no other concerns at this time.
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
## 2019-01-21 NOTE — Telephone Encounter (Signed)
Pt has called to inform that when she went to the hospital yesterday and was told that a buldging disc was the reason for memory issues and pain in her head.  Pt is asking for a call back from RN Katrina

## 2019-01-27 ENCOUNTER — Telehealth: Payer: Self-pay

## 2019-01-27 NOTE — Telephone Encounter (Signed)
Jacqueline Ball stated that she had received a call from [name] saying that we faxed over the some paperwork to clear her from. I advised Jacqueline Ball that [name] recommended that she doesn't drive due to worsening dementia. She also wants her to follow up with neuropsych. Paperwork has been faxed to the [facility]. Jacqueline Ball was very Adult nurse and stated that Dr.Dean is not going to clear her to drive as well.

## 2019-01-29 ENCOUNTER — Telehealth: Payer: Self-pay | Admitting: *Deleted

## 2019-01-29 NOTE — Telephone Encounter (Signed)
Please call to discuss information sent to the [facility]

## 2019-02-02 NOTE — Telephone Encounter (Signed)
Left vm on patients home number.I called pts cell phone but it ring once and beep.

## 2019-02-09 NOTE — Telephone Encounter (Signed)
Pt stopped by to discuss paperwork that had mentioned her memory loss. Unsure why paperwork stated anything of that nature. FYI

## 2019-02-10 NOTE — Telephone Encounter (Signed)
Pt has called asking to speak with RN Katrina.  Pt states she has received letters from the [facility] and she doesn't know what's wrong.  Pt Iis asking for a call back

## 2019-02-10 NOTE — Telephone Encounter (Signed)
Personally spoke with patient when she stepped into the office on 02/09/2019 explaining to her reasoning behind recommending refraining from driving due to cognitive concerns.  She was advised that appointment with neuropsychology needs to be obtained prior to releasing her to return to driving.  Appointment time with Dr. Kieth Brightly provided along with their office number in order to potentially obtain a sooner appointment time.  She was advised that this office cannot do anything regarding her DMV paperwork or driving until after this appointment.  She verbalized understanding and left the office.

## 2019-02-11 NOTE — Telephone Encounter (Signed)
I called patient about her forms for DMV.i explained that our office filled the portion out for neurology.Pt stated DMV wanted Dr.Dean to complete their portion on the form. I explained she needs to contact Dr. August Saucer office about completing their portion on the form. I ask pt does she recall speaking with [name] at the front desk on Monday. Pt states she remembers the conversation with [name]. Pt stated [name] told her to call the neuro psych for an earlier appt. Pt verbalized understanding of what [name] said on Monday and todays conversation.

## 2019-02-27 ENCOUNTER — Telehealth: Payer: Self-pay | Admitting: Adult Health

## 2019-02-27 NOTE — Telephone Encounter (Signed)
Pt states she still has pills remaining of her Memantine.  Pt is asking for a call from RN re: if she should continue taking this medication written by Dr Roda Shutters

## 2019-02-27 NOTE — Telephone Encounter (Signed)
I called pt that she is to continue taking aricept. I stated the namenda was discontinue 11/2018. I stated per JEssica NP she is to take one tablet of aricpet at night. PT verbalized understanding.

## 2019-04-02 ENCOUNTER — Encounter: Payer: Medicare Other | Admitting: Psychology

## 2019-05-12 DIAGNOSIS — N3281 Overactive bladder: Secondary | ICD-10-CM | POA: Insufficient documentation

## 2019-05-12 DIAGNOSIS — F0280 Dementia in other diseases classified elsewhere without behavioral disturbance: Secondary | ICD-10-CM | POA: Insufficient documentation

## 2019-05-12 DIAGNOSIS — M1990 Unspecified osteoarthritis, unspecified site: Secondary | ICD-10-CM | POA: Insufficient documentation

## 2019-05-12 DIAGNOSIS — F0390 Unspecified dementia without behavioral disturbance: Secondary | ICD-10-CM | POA: Insufficient documentation

## 2019-05-12 DIAGNOSIS — E785 Hyperlipidemia, unspecified: Secondary | ICD-10-CM | POA: Insufficient documentation

## 2019-05-12 DIAGNOSIS — K219 Gastro-esophageal reflux disease without esophagitis: Secondary | ICD-10-CM | POA: Insufficient documentation

## 2019-06-26 ENCOUNTER — Other Ambulatory Visit: Payer: Self-pay

## 2019-06-26 ENCOUNTER — Encounter: Payer: Medicare Other | Attending: Psychology | Admitting: Psychology

## 2019-06-26 DIAGNOSIS — E119 Type 2 diabetes mellitus without complications: Secondary | ICD-10-CM | POA: Diagnosis not present

## 2019-06-26 DIAGNOSIS — I4891 Unspecified atrial fibrillation: Secondary | ICD-10-CM | POA: Insufficient documentation

## 2019-06-26 DIAGNOSIS — I10 Essential (primary) hypertension: Secondary | ICD-10-CM | POA: Insufficient documentation

## 2019-06-26 DIAGNOSIS — G301 Alzheimer's disease with late onset: Secondary | ICD-10-CM | POA: Diagnosis present

## 2019-06-26 DIAGNOSIS — Z833 Family history of diabetes mellitus: Secondary | ICD-10-CM | POA: Diagnosis not present

## 2019-06-26 DIAGNOSIS — E785 Hyperlipidemia, unspecified: Secondary | ICD-10-CM | POA: Insufficient documentation

## 2019-06-26 DIAGNOSIS — F0280 Dementia in other diseases classified elsewhere without behavioral disturbance: Secondary | ICD-10-CM

## 2019-06-26 DIAGNOSIS — F0281 Dementia in other diseases classified elsewhere with behavioral disturbance: Secondary | ICD-10-CM | POA: Diagnosis not present

## 2019-06-26 DIAGNOSIS — F02818 Dementia in other diseases classified elsewhere, unspecified severity, with other behavioral disturbance: Secondary | ICD-10-CM

## 2019-07-03 ENCOUNTER — Ambulatory Visit: Payer: Medicare Other | Admitting: Adult Health

## 2019-07-06 ENCOUNTER — Encounter: Payer: Self-pay | Admitting: Psychology

## 2019-07-06 ENCOUNTER — Other Ambulatory Visit: Payer: Self-pay

## 2019-07-06 ENCOUNTER — Encounter: Payer: Medicare Other | Admitting: Psychology

## 2019-07-06 DIAGNOSIS — F0280 Dementia in other diseases classified elsewhere without behavioral disturbance: Secondary | ICD-10-CM

## 2019-07-06 DIAGNOSIS — G301 Alzheimer's disease with late onset: Secondary | ICD-10-CM | POA: Diagnosis not present

## 2019-07-06 NOTE — Progress Notes (Signed)
The patient arrived on time to her 8:00 testing appointment and was accompanied by her daughter. The evaluation lasted 150 minutes.  Behavioral Observations:   Appearance: Casually and appropriately dressed and groomed.  Gait: Ambulated independently without assistance.  Speech:  Clear, normal rate, tone, & volume. WFD noted.  Thought process: Disorganized, tangential, & concrete (e.g. difficulty with abstract thinking. Grandiose at times with some evidence of perseveration noted. No evidence of delusion/halluciantion or bizarre content. Mood & Affect: Expansive, broad, with some inappropriate laughter noted.  Interpersonal: Pleasant, appropriate. Orientation: Oriented x 4. Insight/Judgement: Poor and limited, respectively.  Recent/Remote Memory: Impaired.   She was cooperative with all assigned tasks and appeared to give good effort. She had some difficulty understanding test instructions and required additional prompting on most measures. She exhibited good frustration tolerance on questions she did not know or tasks that were more difficult. She reported feeling fatigued after completing the WAIS-IV and WMS-IV-OA: Brief Cognitive Screening Test . Next testing session scheduled for 07/13/2019 at 13:00 to complete Wechsler Memory Scale-Fourth Edition, Older Adult Battery (WMS-IV-OA).  Tests Administered:  Wechsler Adult Intelligence Scale-Fourth Edition (WAIS-IV)  WMS-IV-OA: Brief Cognitive Screening   Full results to come as testing is incomplete.

## 2019-07-13 ENCOUNTER — Encounter: Payer: Medicare Other | Admitting: Psychology

## 2019-07-16 ENCOUNTER — Ambulatory Visit (HOSPITAL_COMMUNITY)
Admission: EM | Admit: 2019-07-16 | Discharge: 2019-07-16 | Disposition: A | Discharge status: Home / Self Care | Payer: Medicare Other | Attending: Family Medicine | Admitting: Family Medicine

## 2019-07-16 ENCOUNTER — Other Ambulatory Visit: Payer: Self-pay

## 2019-07-16 ENCOUNTER — Emergency Department (HOSPITAL_COMMUNITY)
Admission: EM | Admit: 2019-07-16 | Discharge: 2019-07-16 | Disposition: A | Discharge status: Home / Self Care | Payer: Medicare Other

## 2019-07-16 ENCOUNTER — Encounter (HOSPITAL_COMMUNITY): Payer: Self-pay

## 2019-07-16 ENCOUNTER — Ambulatory Visit (INDEPENDENT_AMBULATORY_CARE_PROVIDER_SITE_OTHER): Payer: Medicare Other

## 2019-07-16 ENCOUNTER — Encounter: Payer: Self-pay | Admitting: Psychology

## 2019-07-16 DIAGNOSIS — S60222A Contusion of left hand, initial encounter: Secondary | ICD-10-CM

## 2019-07-16 NOTE — Discharge Instructions (Signed)
Use ice for 20 min to reduce pain and swelling May take tylenol for pain May wear wrap as needed for comfort Return as needed

## 2019-07-16 NOTE — ED Provider Notes (Signed)
[facility]    CSN: [id number] Arrival date & time: 07/16/19  1510      History   Chief Complaint Chief Complaint  Patient presents with  . Hand Injury    HPI Jacqueline Ball is a 78 y.o. female.   HPI  Has was accidentally closed in a car door and has pain across th knuckles.  Mild swelling.  Can move fingers well.  Decreased grip.  Normal feeling.  Past Medical History:  Diagnosis Date  . Anxiety   . Atrial fibrillation (HCC)   . Diabetes mellitus   . Hyperlipidemia   . Hypertension   . Memory loss   . Mood disorder (HCC)   . Physical exam, annual 10/22/06  . Renal cyst     Patient Active Problem List   Diagnosis Date Noted  . Chronic anticoagulation 03/18/2018  . 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 Chronic atrial fibrillation 11/02/2015  . Essential hypertension 11/02/2015  . Type 2 diabetes mellitus without complication, without long-term current use of insulin (HCC) 11/02/2015  . Cognitive impairment 10/31/2015  . Chest pain 11/22/2014  . Muscle cramps 04/17/2012  . Bilateral shoulder pain 03/09/2012  . Essential hypertension 03/28/2011  . Diabetes mellitus type 2, noninsulin dependent (HCC) 03/28/2011  . 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 ABDOMINAL HYSTERECTOMY      OB History   No obstetric history on file.      Home Medications    Prior to Admission medications   Medication Sig Start Date End Date Taking? Authorizing Provider  acetaminophen (TYLENOL) 500 MG tablet Take 500 mg by mouth every 6 (six) hours as needed for pain.    [provider]  apixaban (ELIQUIS) 5 MG TABS tablet Take 1 tablet (5 mg total) by mouth 2 (two) times daily. 11/24/14   Rinaldo CloudHarwani, Mohan, MD  atorvastatin (LIPITOR) 40 MG tablet Take 40 mg by mouth at bedtime.    [provider]  azelastine (ASTELIN) 0.1 % nasal spray Place 1 spray into both  nostrils as needed for rhinitis or allergies.  05/24/18   [provider]  dicyclomine (BENTYL) 20 MG tablet Take 20 mg by mouth 3 (three) times daily as needed for spasms. 08/29/18   [provider]  donepezil (ARICEPT) 10 MG tablet Take 1 tablet (10 mg total) by mouth at bedtime. 06/06/17   Marvel PlanXu, Jindong, MD  esomeprazole (NEXIUM) 40 MG capsule Take 40 mg by mouth daily at 12 noon.    [provider]  estradiol (CLIMARA) 0.06 MG/24HR Place 0.06 patches onto the skin once a week. 08/12/17   [provider]  fluticasone (FLONASE) 50 MCG/ACT nasal spray Place 1 spray into both nostrils daily. 09/15/18   Bast, Gloris Manchesterraci A, NP  glimepiride (AMARYL) 4 MG tablet Take 4 mg by mouth daily with breakfast.    [provider]  metFORMIN (GLUCOPHAGE) 500 MG tablet Take 500 mg by mouth 2 (two) times daily with a meal.  08/23/15   [provider]  VESICARE 10 MG tablet Take 10 mg by mouth daily.  01/17/18   [provider]    Family History Family History  Problem Relation Age of Onset  . Diabetes Sister     Social History Social History   Tobacco Use  . Smoking status: Never Smoker  . Smokeless tobacco: Never Used  Substance Use Topics  . Alcohol use: No  .  Drug use: No     Allergies   Metoprolol   Review of Systems Review of Systems  Constitutional: Negative for chills and fever.  HENT: Negative for ear pain and sore throat.   Eyes: Negative for pain and visual disturbance.  Respiratory: Negative for cough and shortness of breath.   Cardiovascular: Negative for chest pain and palpitations.  Gastrointestinal: Negative for abdominal pain and vomiting.  Genitourinary: Negative for dysuria and hematuria.  Musculoskeletal: Positive for arthralgias. Negative for back pain.       Hand pain  Skin: Negative for color change and rash.  Neurological: Negative for seizures and syncope.  All other systems reviewed and are negative.    Physical  Exam Triage Vital Signs ED Triage Vitals [07/16/19 1610]  Enc Vitals Group     BP 128/76     Pulse Rate (!) 51     Resp 16     Temp 98.7 F (37.1 C)     Temp Source Oral     SpO2 96 %     Weight      Height      Head Circumference      Peak Flow      Pain Score 8     Pain Loc      Pain Edu?      Excl. in GC?    No data found.  Updated Vital Signs BP 128/76 (BP Location: Left Arm)   Pulse (!) 51   Temp 98.7 F (37.1 C) (Oral)   Resp 16   SpO2 96%      Physical Exam Constitutional:      General: She is not in acute distress.    Appearance: She is well-developed and normal weight.  HENT:     Head: Normocephalic and atraumatic.  Eyes:     Conjunctiva/sclera: Conjunctivae normal.     Pupils: Pupils are equal, round, and reactive to light.  Neck:     Musculoskeletal: Normal range of motion.  Cardiovascular:     Rate and Rhythm: Normal rate.  Pulmonary:     Effort: Pulmonary effort is normal. No respiratory distress.  Abdominal:     General: There is no distension.     Palpations: Abdomen is soft.  Musculoskeletal: Normal range of motion.       Hands:  Skin:    General: Skin is warm and dry.  Neurological:     General: No focal deficit present.     Mental Status: She is alert.     Sensory: No sensory deficit.     Motor: No weakness.  Psychiatric:        Mood and Affect: Mood normal.        Behavior: Behavior normal.      UC Treatments / Results  Labs (all labs ordered are listed, but only abnormal results are displayed) Labs Reviewed - No data to display  EKG   Radiology Dg Hand Complete Left  Result Date: 07/16/2019 CLINICAL DATA:  Crush injury left hand and a wood door yesterday. Initial encounter. EXAM: LEFT HAND - COMPLETE 3+ VIEW COMPARISON:  None. FINDINGS: There is no evidence of fracture or dislocation. Scattered mild degenerative change is most notable at the [facility]. Soft tissues are unremarkable. IMPRESSION: No acute  abnormality. Electronically Signed   By: Drusilla Kannerhomas  Dalessio M.D.   On: 07/16/2019 16:43    Procedures Procedures (including critical care time)  Medications Ordered in UC Medications - No data to display  Initial Impression / Assessment and Plan / UC Course  I have reviewed the triage vital signs and the nursing notes.  Pertinent labs & imaging results that were available during my care of the patient were reviewed by me and considered in my medical decision making (see chart for details).      Final Clinical Impressions(s) / UC Diagnoses   Final diagnoses:  Contusion of left hand, initial encounter     Discharge Instructions     Use ice for 20 min to reduce pain and swelling May take tylenol for pain May wear wrap as needed for comfort Return as needed   ED Prescriptions    None     Controlled Substance Prescriptions [place] Controlled Substance Registry consulted? Not Applicable   Raylene Everts, MD 07/16/19 1740
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
## 2019-09-18 ENCOUNTER — Other Ambulatory Visit: Payer: Self-pay

## 2019-09-18 ENCOUNTER — Encounter: Payer: Medicare Other | Admitting: Psychology

## 2019-09-24 ENCOUNTER — Encounter: Payer: Medicare Other | Admitting: Psychology

## 2019-09-30 ENCOUNTER — Telehealth: Payer: Self-pay

## 2019-09-30 ENCOUNTER — Ambulatory Visit: Payer: Medicare Other | Admitting: Adult Health

## 2019-09-30 NOTE — Telephone Encounter (Signed)
Unable to get in contact with the patient to r/s her appt. LVM asking to call our office to r/s her appt. Office number was provided.     If patients calls back please r/s her appt with Janett Billow for a 3 mon f/u

## 2019-10-02 ENCOUNTER — Encounter: Payer: Self-pay | Admitting: Psychology

## 2019-10-02 ENCOUNTER — Other Ambulatory Visit: Payer: Self-pay

## 2019-10-02 ENCOUNTER — Encounter: Payer: Medicare Other | Attending: Psychology | Admitting: Psychology

## 2019-10-02 DIAGNOSIS — G301 Alzheimer's disease with late onset: Secondary | ICD-10-CM | POA: Insufficient documentation

## 2019-10-02 DIAGNOSIS — F0281 Dementia in other diseases classified elsewhere with behavioral disturbance: Secondary | ICD-10-CM | POA: Diagnosis not present

## 2019-10-02 DIAGNOSIS — E785 Hyperlipidemia, unspecified: Secondary | ICD-10-CM | POA: Insufficient documentation

## 2019-10-02 DIAGNOSIS — I4891 Unspecified atrial fibrillation: Secondary | ICD-10-CM | POA: Diagnosis not present

## 2019-10-02 DIAGNOSIS — F0280 Dementia in other diseases classified elsewhere without behavioral disturbance: Secondary | ICD-10-CM

## 2019-10-02 DIAGNOSIS — Z833 Family history of diabetes mellitus: Secondary | ICD-10-CM | POA: Diagnosis not present

## 2019-10-02 DIAGNOSIS — I10 Essential (primary) hypertension: Secondary | ICD-10-CM | POA: Diagnosis not present

## 2019-10-02 DIAGNOSIS — E119 Type 2 diabetes mellitus without complications: Secondary | ICD-10-CM | POA: Diagnosis not present

## 2019-10-02 NOTE — Progress Notes (Signed)
The patient arrived around 45 minutes late to her 8:00 testing appointment. The evaluation lasted 120 minutes.   Behavioral Observations:    Appearance: Casually and appropriately dressed and groomed.  Gait: Ambulated independently without assistance.  Speech:  Clear, normal rate, tone, & volume. WFD noted.  Thought process: Disorganized, tangential, & concrete (e.g. difficulty with abstract thinking. Grandiose at times with some evidence of perseveration noted. Mood & Affect: Expansive, broad, with some inappropriate laughter noted.  Interpersonal: Pleasant, appropriate. Orientation: Oriented x 4  Insight/Judgement: Poor and limited, respectively.  Recent/Remote Memory: Impaired   She was cooperative with all assigned tasks and appeared to give good effort. She had difficulty understanding most test instructions and required additional prompting throughout the evaluation. She exhibited good frustration tolerance on questions she did not know or tasks that were more difficult. She reported feeling fatigued after about 60 minutes of testing but was able to complete all measures.   Tests Administered (10/02/2019)  Edrick Oh Executive Function System (D-KEFS): Trail Making Test & Verbal Fluency subtests  Wechsler Memory Scale, 4th Edition, Older Adult Battery  Wide Range Achievement Test, 5th Edition, Word Reading subtest   Results (07/06/2019 & 10/02/2019)  Animal Naming Test  . Total=12, T= 47, 38th %) . Repetitions=1 . Intrusions=2  Clock Drawing Test . Below Expectation (Poor placement of numbers, incorrect placement of hands, incorrect time)  D-KEFS  . Trail Making Test  o Condition 1 (Visual Scanning) - SS=6, 9th % o Condition 2 (Number Sequencing) - SS=12, 75th % o Condition 3 (Letter Sequencing) - SS=6, 9th % o Condition 4 (Letter-Number Switching) - SS=2, <1st % o Condition 5 (Motor Speed) - SS=11, 37th %  . Verbal Fluency Test o Letter Fluency - SS=5, 5th %  o Category Fluency - SS=4, 2nd % o Category Switching - SS=1, <1st % o Switching Accuracy - SS=1, <1st %  WAIS-IV   Composite Score Summary  Scale Sum of Scaled Scores Composite Score Percentile Rank 95% Conf. Interval Qualitative Description  Verbal Comprehension 15 VCI 72 3 67-79 Borderline  Perceptual Reasoning 22 PRI 84 14 79-91 Low Average  Working Memory 11 WMI 74 4 69-82 Borderline  Processing Speed 11 PSI 76 5 70-87 Borderline  Full Scale 59 FSIQ 72 3 68-77 Borderline  General Ability 37 GAI 76 5 72-82 Borderline   Index Level Discrepancy Comparisons  Comparison Score 1 Score 2 Difference Critical Value .05 Significant Difference Y/N Base Rate by Overall Sample  VCI - PRI 72 84 -12 9.29 Y 18.8  VCI - WMI 72 74 -2 8.81 N 45.4  VCI - PSI 72 76 -4 10.18 N 42.1  PRI - WMI 84 74 10 9.74 Y 23.7  PRI - PSI 84 76 8 10.99 N 30.1  WMI - PSI 74 76 -2 10.59 N 46.5  FSIQ - GAI 72 76 -4 3.34 Y 23.8   Differences Between Subtest and Overall Mean of Subtest Scores  Subtest Subtest Scaled Score Mean Scaled Score Difference Critical Value .05 Strength or Weakness  Block Design 7 5.90 1.10 2.85   Similarities 4 5.90 -1.90 2.82   Digit Span 6 5.90 0.10 2.22   Matrix Reasoning 8 5.90 2.10 2.54   Vocabulary 6 5.90 0.10 2.03   Arithmetic 5 5.90 -0.90 2.73   Symbol Search 4 5.90 -1.90 3.42   Visual Puzzles 7 5.90 1.10 2.71   Information 5 5.90 -0.90 2.19   Coding 7 5.90 1.10 2.97    WMS-IV-OA  Index Score Summary  Index Sum of Scaled Scores Index Score Percentile Rank 95% Confidence Interval Qualitative Descriptor  Auditory Memory (AMI) 13 58 0.3 54-66 Extremely Low  Visual Memory (VMI) 5 54 0.1 50-60 Extremely Low  Immediate Memory (IMI) 10 58 0.3 54-66 Extremely Low  Delayed Memory (DMI) 8 54 0.1 50-65 Extremely Low   Primary Subtest Scaled Score Summary  Subtest Domain Raw Score Scaled Score Percentile Rank  Logical Memory I AM 13 4 2   Logical Memory II AM 0 1 0.1   Verbal Paired Associates I AM 8 5 5   Verbal Paired Associates II AM 1 3 1   Visual Reproduction I VM 9 1 0.1  Visual Reproduction II VM 2 4 2   Symbol Span VWM 7 6 9    PROCESS SCORE CONVERSIONS  Auditory Memory Process Score Summary  Process Score Raw Score Scaled Score Percentile Rank Cumulative Percentage (Base Rate)  LM II Recognition 14 - - 10-16%  VPA II Recognition 18 - - 3-9%   Visual Memory Process Score Summary  Process Score Raw Score Scaled Score Percentile Rank Cumulative Percentage (Base Rate)  VR II Recognition 4 - - 26-50%   ABILITY-MEMORY ANALYSIS  Ability Score:  GAI: 76 Date of Testing:  WAIS-IV 2019/07/06; WMS-IV 2019/07/13  Predicted Difference Method   Index Predicted WMS-IV Index Score Actual WMS-IV Index Score Difference Critical Value  Significant Difference Y/N Base Rate  Auditory Memory 87 58 29 9.16 Y 1-2%  Visual Memory 86 54 32 8.46 Y <1%  Immediate Memory 84 58 26 10.38 Y 1-2%  Delayed Memory 86 54 32 12.01 Y <1%  Statistical significance (critical value) at the .01 level.   Contrast Scaled Scores  Score Score 1 Score 2 Contrast Scaled Score  General Ability Index vs. Auditory Memory Index 76 58 3  General Ability Index vs. Visual Memory Index 76 54 2  General Ability Index vs. Immediate Memory Index 76 58 3  General Ability Index vs. Delayed Memory Index 76 54 1  Verbal Comprehension Index vs. Auditory Memory Index 72 58 3  Perceptual Reasoning Index vs. Visual Memory Index 84 54 1  Working Memory Index vs. Auditory Memory Index 74 58 3    WRAT-5 (Word Reading)   Subtest/Composite Raw Score Standard Score 90% Confidence Interval Percentile Rank Descriptive Category Grade Equivalent Growth Scale Value  Word Reading 43 75 69 - 81 5 Very Low 4.4 494

## 2019-10-22 ENCOUNTER — Ambulatory Visit: Payer: Medicare Other | Admitting: Adult Health
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
## 2019-11-29 ENCOUNTER — Ambulatory Visit (HOSPITAL_COMMUNITY)
Admission: EM | Admit: 2019-11-29 | Discharge: 2019-11-29 | Disposition: A | Discharge status: Home / Self Care | Payer: Medicare Other | Attending: Family Medicine | Admitting: Family Medicine

## 2019-11-29 ENCOUNTER — Ambulatory Visit (INDEPENDENT_AMBULATORY_CARE_PROVIDER_SITE_OTHER): Payer: Medicare Other

## 2019-11-29 ENCOUNTER — Encounter (HOSPITAL_COMMUNITY): Payer: Self-pay

## 2019-11-29 ENCOUNTER — Other Ambulatory Visit: Payer: Self-pay

## 2019-11-29 DIAGNOSIS — M545 Low back pain, unspecified: Secondary | ICD-10-CM | POA: Diagnosis present

## 2019-11-29 MED ORDER — HYDROCODONE-ACETAMINOPHEN 5-325 MG PO TABS: 2.0000 | ORAL_TABLET | ORAL | 0 refills | Status: DC | PRN

## 2019-11-29 MED ORDER — CYCLOBENZAPRINE HCL 10 MG PO TABS: 10.0000 mg | ORAL_TABLET | Freq: Two times a day (BID) | ORAL | 0 refills | Status: DC | PRN

## 2019-11-29 NOTE — ED Triage Notes (Signed)
Pt presents with lower back pain X 4 days with no relief.

## 2019-11-29 NOTE — ED Provider Notes (Signed)
[facility]    CSN: [id number] Arrival date & time: 11/29/19  1111      History   Chief Complaint Chief Complaint  Patient presents with  . Back Pain    HPI Jacqueline Ball is a 78 y.o. female.   4-day history of low back pain.  Patient thinks it began in her abdomen.  She was given some omeprazole which seemed to help her abdominal discomfort but now pain localizes in her mid low back.  There is no history of trauma.  There is no history of radiation to hips or legs.  She denies any kidney symptoms.  I do not think she has had bone density testing.  HPI  Past Medical History:  Diagnosis Date  . Anxiety   . Atrial fibrillation (HCC)   . Diabetes mellitus   . Hyperlipidemia   . Hypertension   . Memory loss   . Mood disorder (HCC)   . Physical exam, annual 10/22/06  . Renal cyst     Patient Active Problem List   Diagnosis Date Noted  . Chronic anticoagulation 03/18/2018  . 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 Chronic atrial fibrillation (HCC) 11/02/2015  . Essential hypertension 11/02/2015  . Type 2 diabetes mellitus without complication, without long-term current use of insulin (HCC) 11/02/2015  . Cognitive impairment 10/31/2015  . Chest pain 11/22/2014  . Muscle cramps 04/17/2012  . Bilateral shoulder pain 03/09/2012  . Essential hypertension 03/28/2011  . Diabetes mellitus type 2, noninsulin dependent (HCC) 03/28/2011  . 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 ABDOMINAL HYSTERECTOMY      OB History   No obstetric history on file.      Home Medications    Prior to Admission medications   Medication Sig Start Date End Date Taking? Authorizing Provider  acetaminophen (TYLENOL) 500 MG tablet Take 500 mg by mouth every 6 (six) hours as needed for pain.    [provider]  apixaban (ELIQUIS) 5 MG TABS tablet Take 1 tablet (5 mg total) by mouth  2 (two) times daily. 11/24/14   Rinaldo Cloud, MD  atorvastatin (LIPITOR) 40 MG tablet Take 40 mg by mouth at bedtime.    [provider]  azelastine (ASTELIN) 0.1 % nasal spray Place 1 spray into both nostrils as needed for rhinitis or allergies.  05/24/18   [provider]  dicyclomine (BENTYL) 20 MG tablet Take 20 mg by mouth 3 (three) times daily as needed for spasms. 08/29/18   [provider]  donepezil (ARICEPT) 10 MG tablet Take 1 tablet (10 mg total) by mouth at bedtime. 06/06/17   Marvel Plan, MD  esomeprazole (NEXIUM) 40 MG capsule Take 40 mg by mouth daily at 12 noon.    [provider]  estradiol (CLIMARA) 0.06 MG/24HR Place 0.06 patches onto the skin once a week. 08/12/17   [provider]  fluticasone (FLONASE) 50 MCG/ACT nasal spray Place 1 spray into both nostrils daily. 09/15/18   Bast, Gloris Manchester A, NP  glimepiride (AMARYL) 4 MG tablet Take 4 mg by mouth daily with breakfast.    [provider]  metFORMIN (GLUCOPHAGE) 500 MG tablet Take 500 mg by mouth 2 (two) times daily with a meal.  08/23/15   [provider]  VESICARE 10 MG tablet Take 10 mg by mouth daily.  01/17/18   [provider]    Family History Family History  Problem Relation Age of Onset  . Diabetes Sister     Social History Social History   Tobacco Use  . Smoking status: Never Smoker  . Smokeless tobacco: Never Used  Substance Use Topics  . Alcohol use: No  . Drug use: No     Allergies   Metoprolol   Review of Systems Review of Systems  Constitutional: Negative.   Gastrointestinal: Positive for abdominal pain.  Musculoskeletal: Positive for back pain.  All other systems reviewed and are negative.    Physical Exam Triage Vital Signs ED Triage Vitals  Enc Vitals Group     BP 11/29/19 1219 106/67     Pulse Rate 11/29/19 1219 (!) 59     Resp 11/29/19 1219 17     Temp 11/29/19 1219 98.1 F (36.7 C)     Temp Source 11/29/19 1219  Oral     SpO2 11/29/19 1219 98 %     Weight --      Height --      Head Circumference --      Peak Flow --      Pain Score 11/29/19 1220 9     Pain Loc --      Pain Edu? --      Excl. in [place]? --    No data found.  Updated Vital Signs BP 106/67 (BP Location: Left Arm)   Pulse (!) 59   Temp 98.1 F (36.7 C) (Oral)   Resp 17   SpO2 98%   Visual Acuity Right Eye Distance:   Left Eye Distance:   Bilateral Distance:    Right Eye Near:   Left Eye Near:    Bilateral Near:     Physical Exam Vitals and nursing note reviewed.  Constitutional:      Appearance: Normal appearance.  Cardiovascular:     Rate and Rhythm: Normal rate and regular rhythm.  Pulmonary:     Effort: Pulmonary effort is normal.     Breath sounds: Normal breath sounds.  Abdominal:     General: Abdomen is flat. Bowel sounds are normal.     Palpations: Abdomen is soft.  Musculoskeletal:     Comments: Back: There is pain with minimal flexion as well as lateral bending.  Straight leg raising is negative.  Deep tendon reflexes are symmetric. There is some tenderness with palpation over the lower lumbar spine.  Neurological:     Mental Status: She is alert.   LS spine shows no acute process but some degenerative changes.  I am concerned about dilation of her stomach with air but her abdominal exam is benign and she denies vomiting as 1 might get with a gastric outlet obstruction   UC Treatments / Results  Labs (all labs ordered are listed, but only abnormal results are displayed) Labs Reviewed - No data to display  EKG   Radiology No results found.  Procedures Procedures (including critical care time)  Medications Ordered in UC Medications - No data to display  Initial Impression / Assessment and Plan / UC Course  I have reviewed the triage vital signs and the nursing notes.  Pertinent labs & imaging results that were available during my care of the patient were reviewed by me and considered in  my medical decision making (see chart for details).     Low back pain Final Clinical Impressions(s) / UC Diagnoses   Final diagnoses:  None   Discharge Instructions   None    ED Prescriptions  None     PDMP not reviewed this encounter.   Frederica KusterMiller, Davy Faught M, MD 11/29/19 [phone]
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
## 2019-12-22 ENCOUNTER — Ambulatory Visit: Payer: Medicare Other | Admitting: Adult Health

## 2020-01-11 ENCOUNTER — Ambulatory Visit: Payer: Medicare Other | Attending: Internal Medicine

## 2020-01-11 DIAGNOSIS — Z23 Encounter for immunization: Secondary | ICD-10-CM | POA: Insufficient documentation

## 2020-01-12 ENCOUNTER — Other Ambulatory Visit: Payer: Self-pay

## 2020-01-12 ENCOUNTER — Ambulatory Visit (INDEPENDENT_AMBULATORY_CARE_PROVIDER_SITE_OTHER): Payer: Medicare Other | Admitting: Podiatry

## 2020-01-12 ENCOUNTER — Encounter: Payer: Self-pay | Admitting: Podiatry

## 2020-01-12 DIAGNOSIS — L603 Nail dystrophy: Secondary | ICD-10-CM | POA: Diagnosis not present

## 2020-01-12 NOTE — Progress Notes (Signed)
Subjective:  Patient ID: Jacqueline Ball, female    DOB: 1941/04/19,  MRN: [id number] HPI Chief Complaint  Patient presents with  . Nail Problem    Hallux nail bilateral - thick and dark x months, not sore, no injury, no treatment  . New Patient (Initial Visit)    79 y.o. female presents with the above complaint.   ROS: Denies fever chills nausea vomiting muscle aches pains calf pain back pain chest pain shortness of breath.  Past Medical History:  Diagnosis Date  . Anxiety   . Atrial fibrillation ([facility])   . Diabetes mellitus   . Hyperlipidemia   . Hypertension   . Memory loss   . Mood disorder ([facility])   . Physical exam, annual 10/22/06  . Renal cyst    Past Surgical History:  Procedure Laterality Date  . ABDOMINAL HYSTERECTOMY      Current Outpatient Medications:  .  acetaminophen (TYLENOL) 500 MG tablet, Take 500 mg by mouth every 6 (six) hours as needed for pain., Disp: , Rfl:  .  apixaban (ELIQUIS) 5 MG TABS tablet, Take 1 tablet (5 mg total) by mouth 2 (two) times daily., Disp: 60 tablet, Rfl: 3 .  atenolol (TENORMIN) 50 MG tablet, Take 50 mg by mouth daily., Disp: , Rfl:  .  atorvastatin (LIPITOR) 40 MG tablet, Take 40 mg by mouth at bedtime., Disp: , Rfl:  .  azelastine (ASTELIN) 0.1 % nasal spray, Place 1 spray into both nostrils as needed for rhinitis or allergies. , Disp: , Rfl: 3 .  cyclobenzaprine (FLEXERIL) 10 MG tablet, Take 1 tablet (10 mg total) by mouth 2 (two) times daily as needed for muscle spasms., Disp: 20 tablet, Rfl: 0 .  dicyclomine (BENTYL) 20 MG tablet, Take 20 mg by mouth 3 (three) times daily as needed for spasms., Disp: , Rfl: 0 .  donepezil (ARICEPT) 10 MG tablet, Take 1 tablet (10 mg total) by mouth at bedtime., Disp: 90 tablet, Rfl: 3 .  esomeprazole (NEXIUM) 40 MG capsule, Take 40 mg by mouth daily at 12 noon., Disp: , Rfl:  .  estradiol (CLIMARA) 0.06 MG/24HR, Place 0.06 patches onto the skin once a week., Disp: , Rfl:  .  fluticasone (FLONASE)  50 MCG/ACT nasal spray, Place 1 spray into both nostrils daily., Disp: 16 g, Rfl: 2 .  glimepiride (AMARYL) 4 MG tablet, Take 4 mg by mouth daily with breakfast., Disp: , Rfl:  .  HYDROcodone-acetaminophen (NORCO/VICODIN) 5-325 MG tablet, Take 2 tablets by mouth every 4 (four) hours as needed., Disp: 10 tablet, Rfl: 0 .  metFORMIN (GLUCOPHAGE) 500 MG tablet, Take 500 mg by mouth 2 (two) times daily with a meal. , Disp: , Rfl:  .  MYRBETRIQ 50 MG TB24 tablet, Take 50 mg by mouth daily., Disp: , Rfl:  .  omeprazole (PRILOSEC) 40 MG capsule, Take 40 mg by mouth daily., Disp: , Rfl:  .  rosuvastatin (CRESTOR) 10 MG tablet, Take 10 mg by mouth at bedtime., Disp: , Rfl:  .  traMADol (ULTRAM) 50 MG tablet, Take 50 mg by mouth 2 (two) times daily as needed. for pain, Disp: , Rfl:  .  VESICARE 10 MG tablet, Take 10 mg by mouth daily. , Disp: , Rfl:   Allergies  Allergen Reactions  . Metoprolol Nausea And Vomiting   Review of Systems Objective:  There were no vitals filed for this visit.  General: Well developed, nourished, in no acute distress, alert and oriented x3   Dermatological:  Skin is warm, dry and supple bilateral. Nails x 10 are well maintained; remaining integument appears unremarkable at this time. There are no open sores, no preulcerative lesions, no rash or signs of infection present.  Hallux left appears to be growing out from trauma there is subungual hematoma but the nail plate is not loose.  Melanonychia is present fibular border hallux right otherwise no complications to the nails remainder of the nail plates appear to be healthy and normal.  Vascular: Dorsalis Pedis artery and Posterior Tibial artery pedal pulses are 2/4 bilateral with immedate capillary fill time. Pedal hair growth present. No varicosities and no lower extremity edema present bilateral.   Neruologic: Grossly intact via light touch bilateral. Vibratory intact via tuning fork bilateral. Protective threshold with  Semmes Wienstein monofilament intact to all pedal sites bilateral. Patellar and Achilles deep tendon reflexes 2+ bilateral. No Babinski or clonus noted bilateral.   Musculoskeletal: No gross boney pedal deformities bilateral. No pain, crepitus, or limitation noted with foot and ankle range of motion bilateral. Muscular strength 5/5 in all groups tested bilateral.  Gait: Unassisted, Nonantalgic.    Radiographs:  None taken  Assessment & Plan:   Assessment: Nail dystrophy hallux bilateral diabetes mellitus without complications.  Plan: Follow-up with me in 6 months consider diabetic shoes at that time if necessary.  Otherwise reevaluate outgrowth of the left hallux nail plate.     Miciah Shealy T. [place], [place]

## 2020-01-14 ENCOUNTER — Other Ambulatory Visit: Payer: Self-pay

## 2020-01-14 ENCOUNTER — Encounter: Payer: Medicare Other | Attending: Psychology | Admitting: Psychology

## 2020-01-14 DIAGNOSIS — G301 Alzheimer's disease with late onset: Secondary | ICD-10-CM | POA: Diagnosis not present

## 2020-01-14 DIAGNOSIS — F0281 Dementia in other diseases classified elsewhere with behavioral disturbance: Secondary | ICD-10-CM | POA: Insufficient documentation

## 2020-01-14 DIAGNOSIS — F02818 Dementia in other diseases classified elsewhere, unspecified severity, with other behavioral disturbance: Secondary | ICD-10-CM

## 2020-01-14 NOTE — Progress Notes (Signed)
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 on specific recommendations going forward.  If not done so there needs to be power of attorney assignment considerations as the patient will increasingly have inability to make sound judgments and decisions and family members will be needed to monitor and a sister increasingly over time.  Diagnosis:    Late onset Alzheimer's disease with behavioral disturbance (HCC)   Arley Phenix, Psy.D. Neuropsychologist

## 2020-01-26 ENCOUNTER — Other Ambulatory Visit: Payer: Self-pay

## 2020-01-26 ENCOUNTER — Encounter: Payer: Self-pay | Admitting: Psychology

## 2020-01-26 ENCOUNTER — Encounter: Payer: Medicare HMO | Attending: Psychology | Admitting: Psychology

## 2020-01-26 DIAGNOSIS — F02818 Dementia in other diseases classified elsewhere, unspecified severity, with other behavioral disturbance: Secondary | ICD-10-CM

## 2020-01-26 DIAGNOSIS — F0281 Dementia in other diseases classified elsewhere with behavioral disturbance: Secondary | ICD-10-CM | POA: Diagnosis not present

## 2020-01-26 DIAGNOSIS — G301 Alzheimer's disease with late onset: Secondary | ICD-10-CM

## 2020-01-26 NOTE — Progress Notes (Signed)
Today I provided feedback regarding the results of the recent neuropsychological evaluation.  The visit today was an in person visit with myself and the patient present that lasted for 60 minutes.The patient was 30 minutes late for the start of our appointment due to getting lost coming to our office.   She had been to our office on 3 previous occasions but ended up over near [facility] and needed to call to get directions on how to get to our office.  While the patient denied this happening on any other occasion during our time spent today she did acknowledge in an indirect way that she has had difficulties when people "move their office."  The patient continues to deny any cognitive issues or problems with her memory.  She was minimizing issues associated with Alzheimer's disease or the fact that she had any difficulties at all.  The patient did not come in with any family members on this visit.  I did provide the patient with a copy of my report but strongly advised her to have other family members review it including her husband and daughter and reiterated the fact that I would be more than happy to sit down with her and her family to go over the results.  The patient continued to be somewhat avoidant of the diagnosis and in fact denied any problems at all even though there are significant cognitive deficits and she had been told back in 2018 of her preliminary diagnosis of Alzheimer's dementia when she saw Orie Fisherman, PsyD.  I will remain available for further discussion about the diagnosis and its ramifications.  I talked to the patient about my concerns about her continuing to drive into the future and said that this would likely become an issue going forward and the patient minimized any difficulties with driving and denied having any accidents although I am concerned about her geographic disorientation.  Below you will find a copy of the summary and impressions from the formal evaluation.  The  complete report can be found in the patient's EMR dated 01/14/2020.    Summary of Results:                        Overall, the results of the current neuropsychological evaluation are consistent with further weakening and deterioration in her global cognitive functioning and worsening of the cognitive deficits noted in 2018.  The patient showed reduced but not markedly impaired functions related to learning and memory, visual spatial and perceptual abilities, language and certain executive processes.  The patient currently shows significant deficits for memory and learning that are in the severely impaired range.  While she had mild deficits for auditory and visual encoding abilities her ability to store, organize and have available for later recall of both visual and auditory information is severely impaired and more impaired than 2 years ago.  The patient shows further deterioration in global cognitive and intellectual functioning from 2018 and significant deficits relative to pre-existing/premorbid functioning.  The patient shows worsening in her verbal language skills and verbal fluency skills as well as loss of availability of longstanding knowledge including vocabulary and general fund of information.  There is further deterioration in her visual construction and planning abilities as well as other executive functioning abilities including problem solving and reasoning (both verbal and visual reasoning and problem-solving) as well as cognitive flexibility and ability to shift attention.  The patient only showed mild deficits with regard to visual  spatial abilities and visual reasoning/estimation but significant deficits with regard to visual constructional abilities.  Impression/Diagnosis:                     Overall, the results of the current neuropsychological evaluation, including clinical data related to neurological changes and subjective reports by both the patient as well as her daughter dating  back at least to 2016, brain CT scans and previous neuropsychological evaluation in 2018, are consistent with further loss of function and deterioration in overall neuropsychological and cognitive functioning.  There has been further loss in expressive language abilities and verbal fluency as well as deterioration in basic retrieval of longstanding knowledge including vocabulary and general fund of information.  The patient is showing further deterioration with regard to visual constructional and planning abilities, significant deficits and worsening in executive functioning and attention/concentration.  The patient has shown significant memory deficits for both visual and auditory information that are well beyond those that could be explained by her mild auditory and visual encoding deficits.  The patient shows severe deficits for both auditory and visual learning as well as immediate and delayed learning and memory.  These deficits are not simply related to executive functioning deficits impacting retrieval of information but are indicative of deficits for the actual storage and organization of information leaving them unavailable for later retrieval.  On top of this further deterioration in global cognitive functioning particularly related to memory, visual constructional abilities and executive functioning, the patient is also beginning to have increased confusion and alteration in her ability to perform adequate reality testing and has shown increases in manic types of behaviors and tangential and delusional types of thought processes.  While the patient denies any visual disturbance, there are clearly alterations in reality testing leading to some degree of delusional thinking.  Poor judgment and planning, and poor ability to make adequate assessments of environmental demands are clear.  During the evaluation with Dr. Vikki Ports there was indications of neurocognitive disorder that was felt most likely related  to Alzheimer's disease.  The current pattern cognitive strengths and weaknesses would suggest a further exacerbation not only of temporal and parietal lobe involvement but increasing involvement of frontal lobe functioning.  The patient is engaging in an increasing degree of confabulatory efforts to help explain her world and there is an increase in exaggeration or expansion of her pre-existing personality styles and traits that are increasing left unchecked due to executive functioning deficits allowing for unrealistic and expansive thoughts.  The patient is going to need increasing monitoring and assistance from family members where her progressively decreasing executive functioning could be quite problematic.  The patient is attempting to make sense of her world and continuing to be actively engaged but worsening cognitive functioning make that increasingly difficult and potentially placing her in problematic situations if not assisted by family or other resources. This clearly appears to be a progressive dementia that is further worsening and is likely related to late onset dementia of the Alzheimer's type that is impacting temporal, parietal and frontal brain regions in both right and left hemispheres.  I will provide feedback to the patient and her family regarding the results of the current neuropsychological evaluation and we will work on specific recommendations going forward.  If not done so there needs to be power of attorney assignment considerations as the patient will increasingly have inability to make sound judgments and decisions and family members will be needed to monitor and a sister increasingly  over time.  Diagnosis:                               Late onset Alzheimer's disease with behavioral disturbance (HCC)   Arley Phenix, Psy.D. Neuropsychologist

## 2020-02-01 ENCOUNTER — Ambulatory Visit: Payer: Medicare HMO | Attending: Internal Medicine

## 2020-02-01 DIAGNOSIS — Z23 Encounter for immunization: Secondary | ICD-10-CM | POA: Insufficient documentation

## 2020-02-01 NOTE — Progress Notes (Signed)
   Covid-19 Vaccination Clinic  Name:  Jacqueline Ball    MRN: [id number] DOB: 11/10/1941  02/01/2020  [name] was observed post Covid-19 immunization for 15 minutes without incidence. She was provided with Vaccine Information Sheet and instruction to access the V-Safe system.   [name] was instructed to call 911 with any severe reactions post vaccine: [name] Difficulty breathing  . Swelling of your face and throat  . A fast heartbeat  . A bad rash all over your body  . Dizziness and weakness    Immunizations Administered    Name Date Dose VIS Date Route   Pfizer COVID-19 Vaccine 02/01/2020 12:49 PM 0.3 mL 11/27/2019 Intramuscular   Manufacturer: [employer], [employer]   Lot: [id number]   NDC: [address]-5

## 2020-02-12 ENCOUNTER — Other Ambulatory Visit: Payer: Self-pay | Admitting: Family Medicine

## 2020-02-12 DIAGNOSIS — R109 Unspecified abdominal pain: Secondary | ICD-10-CM

## 2020-02-16 ENCOUNTER — Other Ambulatory Visit: Payer: Self-pay | Admitting: Internal Medicine

## 2020-02-16 DIAGNOSIS — R109 Unspecified abdominal pain: Secondary | ICD-10-CM

## 2020-02-19 ENCOUNTER — Ambulatory Visit
Admission: RE | Admit: 2020-02-19 | Discharge: 2020-02-19 | Disposition: A | Discharge status: Home / Self Care | Payer: Medicare Other | Source: Ambulatory Visit | Attending: Family Medicine | Admitting: Family Medicine

## 2020-02-19 DIAGNOSIS — R109 Unspecified abdominal pain: Secondary | ICD-10-CM

## 2020-02-19 MED ORDER — IOPAMIDOL (ISOVUE-300) INJECTION 61%
100.0000 mL | Freq: Once | INTRAVENOUS | Status: AC | PRN
  Administered 2020-02-19: 13:00:00 100 mL via INTRAVENOUS

## 2020-02-24 ENCOUNTER — Other Ambulatory Visit: Payer: Self-pay

## 2020-02-24 ENCOUNTER — Ambulatory Visit (HOSPITAL_COMMUNITY)
Admission: EM | Admit: 2020-02-24 | Discharge: 2020-02-24 | Disposition: A | Discharge status: Home / Self Care | Payer: Medicare Other | Attending: Emergency Medicine | Admitting: Emergency Medicine

## 2020-02-24 ENCOUNTER — Encounter (HOSPITAL_COMMUNITY): Payer: Self-pay | Admitting: Emergency Medicine

## 2020-02-24 DIAGNOSIS — G8929 Other chronic pain: Secondary | ICD-10-CM

## 2020-02-24 DIAGNOSIS — M79642 Pain in left hand: Secondary | ICD-10-CM

## 2020-02-24 DIAGNOSIS — E119 Type 2 diabetes mellitus without complications: Secondary | ICD-10-CM | POA: Diagnosis not present

## 2020-02-24 DIAGNOSIS — R1013 Epigastric pain: Secondary | ICD-10-CM | POA: Diagnosis not present

## 2020-02-24 LAB — CBG MONITORING, ED: Glucose-Capillary: 100 mg/dL — ABNORMAL HIGH (ref 70–99)

## 2020-02-24 LAB — GLUCOSE, CAPILLARY: Glucose-Capillary: 100 mg/dL — ABNORMAL HIGH (ref 70–99)

## 2020-02-24 MED ORDER — PANTOPRAZOLE SODIUM 20 MG PO TBEC: 20.0000 mg | DELAYED_RELEASE_TABLET | Freq: Every day | ORAL | 0 refills | Status: DC

## 2020-02-24 NOTE — ED Triage Notes (Addendum)
Right hand pain and bilateral foot pain.  This issue has been going on for "24 days or so".  This pain wakes patient at night.    Patient has nausea and "feels [place] after eating" intermittent issues with this  Denies chest pain, but now has intermittent chest pain  Patient is a poor historian

## 2020-02-24 NOTE — Discharge Instructions (Addendum)
Discussed that pt will need to see MD August Saucer for follow up looked at results and has hx of diverticulosis .  Completed ekg and cbg with normal results.  Educated pt that she will need to avoid sugars since she is a diabetic. Avoid spicy foods  Did not see any injury to hand

## 2020-02-24 NOTE — ED Provider Notes (Signed)
[facility]    CSN: [id number] Arrival date & time: 02/24/20  1753      History   Chief Complaint Chief Complaint  Patient presents with  . Hand Problem    HPI Clemmie Buelna is a 79 y.o. female.   Pt is poor historian. States that she has had hand pain with no injury and a burnging sensation to upper abd area for the past 3 months now. Was seen by MD Marlou Sa on 02/19/2020 and a ct scan was completed. States that she has been eating snickers and drinking grape soda lately. Denies any chest pain or change in symptoms for 3 months now. Pt is to return to see MD Marlou Sa soon she is unsure of the date.      Past Medical History:  Diagnosis Date  . Anxiety   . Atrial fibrillation ([place])   . Diabetes mellitus   . Hyperlipidemia   . Hypertension   . Memory loss   . Mood disorder ([place])   . Physical exam, annual 10/22/06  . Renal cyst     Patient Active Problem List   Diagnosis Date Noted  . Acute midline low back pain without sciatica 11/29/2019  . GERD (gastroesophageal reflux disease) 05/12/2019  . Arthritis 05/12/2019  . Dementia ([place]) 05/12/2019  . Hyperlipidemia 05/12/2019  . Overactive bladder 05/12/2019  . Chronic anticoagulation 03/18/2018  . Late onset Alzheimer's disease without behavioral disturbance ([place]) 01/21/2017  . Chronic atrial fibrillation ([place]) 11/02/2015  . Essential hypertension 11/02/2015  . Type 2 diabetes mellitus without complication, without long-term current use of insulin ([place]) 11/02/2015  . Cognitive impairment 10/31/2015  . Chest pain 11/22/2014  . Muscle cramps 04/17/2012  . Bilateral shoulder pain 03/09/2012  . Essential hypertension 03/28/2011  . Diabetes mellitus type 2, noninsulin dependent ([place]) 03/28/2011  . 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 ABDOMINAL HYSTERECTOMY      OB History   No obstetric history on file.      Home Medications    Prior to Admission  medications   Medication Sig Start Date End Date Taking? Authorizing Provider  acetaminophen (TYLENOL) 500 MG tablet Take 500 mg by mouth every 6 (six) hours as needed for pain.    [provider]  apixaban (ELIQUIS) 5 MG TABS tablet Take 1 tablet (5 mg total) by mouth 2 (two) times daily. 11/24/14   Charolette Forward, MD  atenolol (TENORMIN) 50 MG tablet Take 50 mg by mouth daily. 07/29/19   [provider]  atorvastatin (LIPITOR) 40 MG tablet Take 40 mg by mouth at bedtime.    [provider]  azelastine (ASTELIN) 0.1 % nasal spray Place 1 spray into both nostrils as needed for rhinitis or allergies.  05/24/18   [provider]  cyclobenzaprine (FLEXERIL) 10 MG tablet Take 1 tablet (10 mg total) by mouth 2 (two) times daily as needed for muscle spasms. 11/29/19   Wardell Honour, MD  dicyclomine (BENTYL) 20 MG tablet Take 20 mg by mouth 3 (three) times daily as needed for spasms. 08/29/18   [provider]  donepezil (ARICEPT) 10 MG tablet Take 1 tablet (10 mg total) by mouth at bedtime. 06/06/17   Rosalin Hawking, MD  esomeprazole (NEXIUM) 40 MG capsule Take 40 mg by mouth daily at 12 noon.    [provider]  estradiol (CLIMARA) 0.06 MG/24HR Place 0.06 patches onto the skin once a week. 08/12/17   [provider]  fluticasone (FLONASE) 50 MCG/ACT nasal spray Place 1 spray into both nostrils daily. 09/15/18   Bast, Gloris Manchester A, NP  glimepiride (AMARYL) 4 MG tablet Take 4 mg by mouth daily with breakfast.    [provider]  HYDROcodone-acetaminophen (NORCO/VICODIN) 5-325 MG tablet Take 2 tablets by mouth every 4 (four) hours as needed. 11/29/19   Frederica Kuster, MD  metFORMIN (GLUCOPHAGE) 500 MG tablet Take 500 mg by mouth 2 (two) times daily with a meal.  08/23/15   [provider]  MYRBETRIQ 50 MG TB24 tablet Take 50 mg by mouth daily. 07/29/19   [provider]  omeprazole (PRILOSEC) 40 MG capsule Take 40 mg by mouth  daily. 11/27/19   [provider]  rosuvastatin (CRESTOR) 10 MG tablet Take 10 mg by mouth at bedtime. 09/14/19   [provider]  traMADol (ULTRAM) 50 MG tablet Take 50 mg by mouth 2 (two) times daily as needed. for pain 09/24/19   [provider]  VESICARE 10 MG tablet Take 10 mg by mouth daily.  01/17/18   [provider]    Family History Family History  Problem Relation Age of Onset  . Diabetes Sister     Social History Social History   Tobacco Use  . Smoking status: Never Smoker  . Smokeless tobacco: Never Used  Substance Use Topics  . Alcohol use: No  . Drug use: No     Allergies   Metoprolol   Review of Systems Review of Systems  Constitutional: Negative.   HENT: Negative.   Respiratory: Negative.   Cardiovascular: Negative.   Gastrointestinal:       Burning sensation to upper abd area   Genitourinary: Negative.   Neurological:       Tingling to lt hand, no change for 3 months now      Physical Exam Triage Vital Signs ED Triage Vitals  Enc Vitals Group     BP 02/24/20 1856 138/89     Pulse Rate 02/24/20 1856 66     Resp 02/24/20 1856 (!) 22     Temp 02/24/20 1856 98.2 F (36.8 C)     Temp Source 02/24/20 1856 Oral     SpO2 02/24/20 1856 99 %     Weight --      Height --      Head Circumference --      Peak Flow --      Pain Score 02/24/20 1854 7     Pain Loc --      Pain Edu? --      Excl. in GC? --    No data found.  Updated Vital Signs BP 138/89 (BP Location: Right Arm)   Pulse 66   Temp 98.2 F (36.8 C) (Oral)   Resp (!) 22   SpO2 99%   Visual Acuity Right Eye Distance:   Left Eye Distance:   Bilateral Distance:    Right Eye Near:   Left Eye Near:    Bilateral Near:     Physical Exam Constitutional:      Comments: Seems to be confused at times during conversation. Is not able to give exact problems or concerns. Poor historian   Cardiovascular:     Rate and Rhythm: Normal rate.  Pulmonary:      Effort: Pulmonary effort is normal.  Abdominal:     General: Abdomen is flat. Bowel sounds are normal.  Musculoskeletal:        General: Normal  range of motion.     Cervical back: Normal range of motion.  Skin:    General: Skin is warm.     Capillary Refill: Capillary refill takes less than 2 seconds.  Neurological:     Mental Status: She is alert.      UC Treatments / Results  Labs (all labs ordered are listed, but only abnormal results are displayed) Labs Reviewed  GLUCOSE, CAPILLARY - Abnormal; Notable for the following components:      Result Value   Glucose-Capillary 100 (*)    All other components within normal limits  CBG MONITORING, ED - Abnormal; Notable for the following components:   Glucose-Capillary 100 (*)    All other components within normal limits    EKG   Radiology No results found.  Procedures Procedures (including critical care time)  Medications Ordered in UC Medications - No data to display  Initial Impression / Assessment and Plan / UC Course  I have reviewed the triage vital signs and the nursing notes.  Pertinent labs & imaging results that were available during my care of the patient were reviewed by me and considered in my medical decision making (see chart for details).    Discussed that pt will need to see MD August Saucer for follow up looked at results and has hx of diverticulosis .  Completed ekg and cbg with normal results.  Educated pt that she will need to avoid sugars since she is a diabetic. Avoid spicy foods    Final Clinical Impressions(s) / UC Diagnoses   Final diagnoses:  None   Discharge Instructions   None    ED Prescriptions    None     PDMP not reviewed this encounter.   Coralyn Mark, NP 02/24/20 1942

## 2020-03-12 ENCOUNTER — Encounter (HOSPITAL_COMMUNITY): Payer: Self-pay

## 2020-03-12 ENCOUNTER — Ambulatory Visit (HOSPITAL_COMMUNITY)
Admission: EM | Admit: 2020-03-12 | Discharge: 2020-03-12 | Disposition: A | Discharge status: Home / Self Care | Payer: Medicare Other | Attending: Family Medicine | Admitting: Family Medicine

## 2020-03-12 ENCOUNTER — Other Ambulatory Visit: Payer: Self-pay

## 2020-03-12 DIAGNOSIS — M545 Low back pain, unspecified: Secondary | ICD-10-CM

## 2020-03-12 DIAGNOSIS — N3001 Acute cystitis with hematuria: Secondary | ICD-10-CM | POA: Diagnosis not present

## 2020-03-12 DIAGNOSIS — R300 Dysuria: Secondary | ICD-10-CM | POA: Diagnosis present

## 2020-03-12 LAB — POCT URINALYSIS DIP (DEVICE)
Bilirubin Urine: NEGATIVE
Glucose, UA: NEGATIVE mg/dL
Ketones, ur: NEGATIVE mg/dL
Nitrite: POSITIVE — AB
Protein, ur: >=300 mg/dL — AB
Specific Gravity, Urine: >=1.03 (ref 1.005–1.030)
Urobilinogen, UA: 0.2 mg/dL (ref 0.0–1.0)
pH: 5.5 (ref 5.0–8.0)

## 2020-03-12 MED ORDER — CEFTRIAXONE SODIUM 500 MG IJ SOLR
500.0000 mg | Freq: Once | INTRAMUSCULAR | Status: AC
  Administered 2020-03-12: 500 mg via INTRAMUSCULAR

## 2020-03-12 MED ORDER — ACETAMINOPHEN 325 MG PO TABS
ORAL_TABLET | ORAL | Status: AC
  Filled 2020-03-12: qty 2

## 2020-03-12 MED ORDER — CEFTRIAXONE SODIUM 500 MG IJ SOLR
INTRAMUSCULAR | Status: AC
  Filled 2020-03-12: qty 500

## 2020-03-12 MED ORDER — LIDOCAINE HCL (PF) 1 % IJ SOLN
INTRAMUSCULAR | Status: AC
  Filled 2020-03-12: qty 2

## 2020-03-12 MED ORDER — ACETAMINOPHEN 325 MG PO TABS
650.0000 mg | ORAL_TABLET | Freq: Once | ORAL | Status: AC
  Administered 2020-03-12: 650 mg via ORAL

## 2020-03-12 MED ORDER — SULFAMETHOXAZOLE-TRIMETHOPRIM 800-160 MG PO TABS: 1.0000 | ORAL_TABLET | Freq: Two times a day (BID) | ORAL | 0 refills | Status: DC

## 2020-03-12 NOTE — ED Provider Notes (Signed)
[facility]   MRN: [id number] DOB: 06/11/1941  Subjective:   Jacqueline Ball is a 79 y.o. female presenting for 1 week history of acute onset worsening dysuria, urinary frequency now having bilateral low back pain and lower abdominal pain both are worse when she urinates.  Denies fever, nausea, vomiting, hematuria.  Patient is a diabetic.  No current facility-administered medications for this encounter.  Current Outpatient Medications:  .  acetaminophen (TYLENOL) 500 MG tablet, Take 500 mg by mouth every 6 (six) hours as needed for pain., Disp: , Rfl:  .  apixaban (ELIQUIS) 5 MG TABS tablet, Take 1 tablet (5 mg total) by mouth 2 (two) times daily., Disp: 60 tablet, Rfl: 3 .  atenolol (TENORMIN) 50 MG tablet, Take 50 mg by mouth daily., Disp: , Rfl:  .  atorvastatin (LIPITOR) 40 MG tablet, Take 40 mg by mouth at bedtime., Disp: , Rfl:  .  azelastine (ASTELIN) 0.1 % nasal spray, Place 1 spray into both nostrils as needed for rhinitis or allergies. , Disp: , Rfl: 3 .  cyclobenzaprine (FLEXERIL) 10 MG tablet, Take 1 tablet (10 mg total) by mouth 2 (two) times daily as needed for muscle spasms., Disp: 20 tablet, Rfl: 0 .  dicyclomine (BENTYL) 20 MG tablet, Take 20 mg by mouth 3 (three) times daily as needed for spasms., Disp: , Rfl: 0 .  donepezil (ARICEPT) 10 MG tablet, Take 1 tablet (10 mg total) by mouth at bedtime., Disp: 90 tablet, Rfl: 3 .  esomeprazole (NEXIUM) 40 MG capsule, Take 40 mg by mouth daily at 12 noon., Disp: , Rfl:  .  estradiol (CLIMARA) 0.06 MG/24HR, Place 0.06 patches onto the skin once a week., Disp: , Rfl:  .  fluticasone (FLONASE) 50 MCG/ACT nasal spray, Place 1 spray into both nostrils daily., Disp: 16 g, Rfl: 2 .  glimepiride (AMARYL) 4 MG tablet, Take 4 mg by mouth daily with breakfast., Disp: , Rfl:  .  HYDROcodone-acetaminophen (NORCO/VICODIN) 5-325 MG tablet, Take 2 tablets by mouth every 4 (four) hours as needed., Disp: 10 tablet, Rfl: 0 .  metFORMIN  (GLUCOPHAGE) 500 MG tablet, Take 500 mg by mouth 2 (two) times daily with a meal. , Disp: , Rfl:  .  MYRBETRIQ 50 MG TB24 tablet, Take 50 mg by mouth daily., Disp: , Rfl:  .  omeprazole (PRILOSEC) 40 MG capsule, Take 40 mg by mouth daily., Disp: , Rfl:  .  pantoprazole (PROTONIX) 20 MG tablet, Take 1 tablet (20 mg total) by mouth daily., Disp: 30 tablet, Rfl: 0 .  rosuvastatin (CRESTOR) 10 MG tablet, Take 10 mg by mouth at bedtime., Disp: , Rfl:  .  traMADol (ULTRAM) 50 MG tablet, Take 50 mg by mouth 2 (two) times daily as needed. for pain, Disp: , Rfl:  .  VESICARE 10 MG tablet, Take 10 mg by mouth daily. , Disp: , Rfl:    Allergies  Allergen Reactions  . Metoprolol Nausea And Vomiting    Past Medical History:  Diagnosis Date  . Anxiety   . Atrial fibrillation ([place])   . Diabetes mellitus   . Hyperlipidemia   . Hypertension   . Memory loss   . Mood disorder ([place])   . Physical exam, annual 10/22/06  . Renal cyst      Past Surgical History:  Procedure Laterality Date  . ABDOMINAL HYSTERECTOMY      Family History  Problem Relation Age of Onset  . Diabetes Sister     Social History  Tobacco Use  . Smoking status: Never Smoker  . Smokeless tobacco: Never Used  Substance Use Topics  . Alcohol use: No  . Drug use: No    ROS   Objective:   Vitals: BP 120/67 (BP Location: Right Arm)   Pulse 62   Temp 98.2 F (36.8 C) (Oral)   Resp 16   Wt 134 lb (60.8 kg)   SpO2 100%   BMI 23.00 kg/m   Physical Exam Constitutional:      General: She is not in acute distress.    Appearance: Normal appearance. She is well-developed and normal weight. She is ill-appearing. She is not toxic-appearing or diaphoretic.  HENT:     Head: Normocephalic and atraumatic.     Right Ear: External ear normal.     Left Ear: External ear normal.     Nose: Nose normal.     Mouth/Throat:     Mouth: Mucous membranes are moist.     Pharynx: Oropharynx is clear.  Eyes:     General: No  scleral icterus.    Extraocular Movements: Extraocular movements intact.     Pupils: Pupils are equal, round, and reactive to light.  Cardiovascular:     Rate and Rhythm: Normal rate and regular rhythm.     Pulses: Normal pulses.     Heart sounds: Normal heart sounds. No murmur. No friction rub. No gallop.   Pulmonary:     Effort: Pulmonary effort is normal. No respiratory distress.     Breath sounds: Normal breath sounds. No stridor. No wheezing, rhonchi or rales.  Abdominal:     General: Bowel sounds are normal. There is no distension.     Palpations: Abdomen is soft. There is no mass.     Tenderness: There is abdominal tenderness (lower abdomen). There is right CVA tenderness (mild). There is no left CVA tenderness, guarding or rebound.  Skin:    General: Skin is warm and dry.     Coloration: Skin is not pale.     Findings: No rash.  Neurological:     General: No focal deficit present.     Mental Status: She is alert and oriented to person, place, and time.  Psychiatric:        Mood and Affect: Mood normal.        Behavior: Behavior normal.        Thought Content: Thought content normal.        Judgment: Judgment normal.     Results for orders placed or performed during the hospital encounter of 03/12/20 (from the past 24 hour(s))  POCT urinalysis dip (device)     Status: Abnormal   Collection Time: 03/12/20  1:29 PM  Result Value Ref Range   Glucose, UA NEGATIVE NEGATIVE mg/dL   Bilirubin Urine NEGATIVE NEGATIVE   Ketones, ur NEGATIVE NEGATIVE mg/dL   Specific Gravity, Urine >=1.030 1.005 - 1.030   Hgb urine dipstick LARGE (A) NEGATIVE   pH 5.5 5.0 - 8.0   Protein, ur >=300 (A) NEGATIVE mg/dL   Urobilinogen, UA 0.2 0.0 - 1.0 mg/dL   Nitrite POSITIVE (A) NEGATIVE   Leukocytes,Ua LARGE (A) NEGATIVE    Assessment and Plan :   1. Acute hemorrhagic cystitis   2. Dysuria   3. Acute low back pain without sciatica, unspecified back pain laterality     IM ceftriaxone in  clinic, Bactrim as an outpatient.  Urine culture is pending.  Recommended pushing fluids, APAP for pain control. APAP given  in clinic. Counseled patient on potential for adverse effects with medications prescribed/recommended today, ER and return-to-clinic precautions discussed, patient verbalized understanding.    Wallis Bamberg, PA-C 03/12/20 1429

## 2020-03-12 NOTE — ED Triage Notes (Signed)
Pt states she has a lower back and pain when she voiding. X 1 week.

## 2020-03-14 LAB — URINE CULTURE: Culture: >=100000 — AB

## 2020-04-01 ENCOUNTER — Emergency Department (HOSPITAL_COMMUNITY)
Admission: EM | Admit: 2020-04-01 | Discharge: 2020-04-01 | Disposition: A | Discharge status: Home / Self Care | Payer: Medicare HMO | Attending: Emergency Medicine | Admitting: Emergency Medicine

## 2020-04-01 ENCOUNTER — Other Ambulatory Visit: Payer: Self-pay

## 2020-04-01 ENCOUNTER — Encounter (HOSPITAL_COMMUNITY): Payer: Self-pay | Admitting: Emergency Medicine

## 2020-04-01 ENCOUNTER — Emergency Department (HOSPITAL_COMMUNITY): Payer: Medicare HMO

## 2020-04-01 DIAGNOSIS — G301 Alzheimer's disease with late onset: Secondary | ICD-10-CM | POA: Insufficient documentation

## 2020-04-01 DIAGNOSIS — Z7901 Long term (current) use of anticoagulants: Secondary | ICD-10-CM | POA: Diagnosis not present

## 2020-04-01 DIAGNOSIS — E119 Type 2 diabetes mellitus without complications: Secondary | ICD-10-CM | POA: Insufficient documentation

## 2020-04-01 DIAGNOSIS — I10 Essential (primary) hypertension: Secondary | ICD-10-CM | POA: Insufficient documentation

## 2020-04-01 DIAGNOSIS — F0280 Dementia in other diseases classified elsewhere without behavioral disturbance: Secondary | ICD-10-CM | POA: Insufficient documentation

## 2020-04-01 DIAGNOSIS — Z7984 Long term (current) use of oral hypoglycemic drugs: Secondary | ICD-10-CM | POA: Insufficient documentation

## 2020-04-01 DIAGNOSIS — Z79899 Other long term (current) drug therapy: Secondary | ICD-10-CM | POA: Insufficient documentation

## 2020-04-01 DIAGNOSIS — M65331 Trigger finger, right middle finger: Secondary | ICD-10-CM | POA: Diagnosis not present

## 2020-04-01 DIAGNOSIS — M79644 Pain in right finger(s): Secondary | ICD-10-CM | POA: Diagnosis present

## 2020-04-01 NOTE — ED Triage Notes (Addendum)
Per pt, states right arm pain radiating down to fingers-fingers numb and tingling-symptoms for 2 weeks-no injury or truama

## 2020-04-01 NOTE — ED Provider Notes (Signed)
[facility] DEPT Provider Note   CSN: [id number] Arrival date & time: 04/01/20  1649     History Chief Complaint  Patient presents with  . Arm Pain    Jacqueline Ball is a 79 y.o. female.  Patient with complaint 2-week history of problem with her right ring finger when she tries to flex it not working properly.  It seems to get a get stuck and then jumps.  And there is pain associated with that.  Patient does have a history of dementia.  But is alert and will follow commands here.  Patient denies any injury.        Past Medical History:  Diagnosis Date  . Anxiety   . Atrial fibrillation ([place])   . Diabetes mellitus   . Hyperlipidemia   . Hypertension   . Memory loss   . Mood disorder ([place])   . Physical exam, annual 10/22/06  . Renal cyst     Patient Active Problem List   Diagnosis Date Noted  . Acute midline low back pain without sciatica 11/29/2019  . GERD (gastroesophageal reflux disease) 05/12/2019  . Arthritis 05/12/2019  . Dementia ([place]) 05/12/2019  . Hyperlipidemia 05/12/2019  . Overactive bladder 05/12/2019  . Chronic anticoagulation 03/18/2018  . Late onset Alzheimer's disease without behavioral disturbance ([place]) 01/21/2017  . Chronic atrial fibrillation ([place]) 11/02/2015  . Essential hypertension 11/02/2015  . Type 2 diabetes mellitus without complication, without long-term current use of insulin ([place]) 11/02/2015  . Cognitive impairment 10/31/2015  . Chest pain 11/22/2014  . Muscle cramps 04/17/2012  . Bilateral shoulder pain 03/09/2012  . Essential hypertension 03/28/2011  . Diabetes mellitus type 2, noninsulin dependent ([place]) 03/28/2011  . 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 ABDOMINAL HYSTERECTOMY       OB History   No obstetric history on file.     Family History  Problem Relation Age of Onset  . Diabetes Sister     Social History   Tobacco Use  .  Smoking status: Never Smoker  . Smokeless tobacco: Never Used  Substance Use Topics  . Alcohol use: No  . Drug use: No    Home Medications Prior to Admission medications   Medication Sig Start Date End Date Taking? Authorizing Provider  acetaminophen (TYLENOL) 500 MG tablet Take 500 mg by mouth every 6 (six) hours as needed for pain.    [provider]  apixaban (ELIQUIS) 5 MG TABS tablet Take 1 tablet (5 mg total) by mouth 2 (two) times daily. 11/24/14   Charolette Forward, MD  atenolol (TENORMIN) 50 MG tablet Take 50 mg by mouth daily. 07/29/19   [provider]  atorvastatin (LIPITOR) 40 MG tablet Take 40 mg by mouth at bedtime.    [provider]  azelastine (ASTELIN) 0.1 % nasal spray Place 1 spray into both nostrils as needed for rhinitis or allergies.  05/24/18   [provider]  cyclobenzaprine (FLEXERIL) 10 MG tablet Take 1 tablet (10 mg total) by mouth 2 (two) times daily as needed for muscle spasms. 11/29/19   Wardell Honour, MD  dicyclomine (BENTYL) 20 MG tablet Take 20 mg by mouth 3 (three) times daily as needed for spasms. 08/29/18   [provider]  donepezil (ARICEPT) 10 MG tablet Take 1 tablet (10 mg total) by mouth at bedtime. 06/06/17   Rosalin Hawking, MD  esomeprazole (NEXIUM) 40 MG capsule Take 40 mg by mouth  daily at 12 noon.    [provider]  estradiol (CLIMARA) 0.06 MG/24HR Place 0.06 patches onto the skin once a week. 08/12/17   [provider]  fluticasone (FLONASE) 50 MCG/ACT nasal spray Place 1 spray into both nostrils daily. 09/15/18   Bast, Gloris Manchester A, NP  glimepiride (AMARYL) 4 MG tablet Take 4 mg by mouth daily with breakfast.    [provider]  HYDROcodone-acetaminophen (NORCO/VICODIN) 5-325 MG tablet Take 2 tablets by mouth every 4 (four) hours as needed. 11/29/19   Frederica Kuster, MD  metFORMIN (GLUCOPHAGE) 500 MG tablet Take 500 mg by mouth 2 (two) times daily with a meal.  08/23/15   [provider]  MYRBETRIQ 50 MG TB24 tablet Take 50 mg by mouth daily. 07/29/19   [provider]  omeprazole (PRILOSEC) 40 MG capsule Take 40 mg by mouth daily. 11/27/19   [provider]  pantoprazole (PROTONIX) 20 MG tablet Take 1 tablet (20 mg total) by mouth daily. 02/24/20   Coralyn Mark, NP  rosuvastatin (CRESTOR) 10 MG tablet Take 10 mg by mouth at bedtime. 09/14/19   [provider]  sulfamethoxazole-trimethoprim (BACTRIM DS) 800-160 MG tablet Take 1 tablet by mouth 2 (two) times daily. 03/12/20   Wallis Bamberg, PA-C  traMADol (ULTRAM) 50 MG tablet Take 50 mg by mouth 2 (two) times daily as needed. for pain 09/24/19   [provider]  VESICARE 10 MG tablet Take 10 mg by mouth daily.  01/17/18   [provider]    Allergies    Metoprolol  Review of Systems   Review of Systems  Unable to perform ROS: Dementia    Physical Exam Updated Vital Signs BP 124/79 (BP Location: Left Arm)   Pulse 67   Temp 97.9 F (36.6 C) (Oral)   Resp 18   Wt 65.1 kg   SpO2 98%   BMI 24.65 kg/m   Physical Exam Vitals and nursing note reviewed.  Constitutional:      General: She is not in acute distress.    Appearance: She is well-developed.  HENT:     Head: Normocephalic and atraumatic.  Eyes:     Conjunctiva/sclera: Conjunctivae normal.  Cardiovascular:     Rate and Rhythm: Normal rate and regular rhythm.     Heart sounds: No murmur.  Pulmonary:     Effort: Pulmonary effort is normal. No respiratory distress.     Breath sounds: Normal breath sounds.  Abdominal:     Palpations: Abdomen is soft.     Tenderness: There is no abdominal tenderness.  Musculoskeletal:        General: No swelling or tenderness.     Cervical back: Neck supple.     Comments: Patient points to an abnormality in her right ring finger at the PIP joint area.  When she tries to flex she gets some pain in the finger kind of jumps.  Suggestive of a trigger finger.  Sensation  intact good cap refill.  Other fingers are working fine.  No evidence of any tenosynovitis.  No swelling to the finger.  Skin:    General: Skin is warm and dry.     Capillary Refill: Capillary refill takes less than 2 seconds.  Neurological:     General: No focal deficit present.     Mental Status: She is alert. Mental status is at baseline.     Cranial Nerves: No cranial nerve deficit.     Sensory: No sensory deficit.  Motor: No weakness.     ED Results / Procedures / Treatments   Labs (all labs ordered are listed, but only abnormal results are displayed) Labs Reviewed - No data to display  EKG None  Radiology DG Hand Complete Right  Result Date: 04/01/2020 CLINICAL DATA:  Ring finger pain extending into palm for 1 week, no known injury EXAM: RIGHT HAND - COMPLETE 3+ VIEW COMPARISON:  None FINDINGS: No acute bony abnormality. Specifically, no fracture, subluxation, or dislocation. No focal abnormality of the fourth digit. Mild degenerative changes in the hand most notably at the first carpometacarpal and triscaphe joint. Soft tissues are unremarkable. IMPRESSION: No acute bony abnormality. Mild degenerative changes in the hand. No focal abnormality of the fourth digit. Electronically Signed   By: Kreg Shropshire M.D.   On: 04/01/2020 19:19    Procedures Procedures (including critical care time)  Medications Ordered in ED Medications - No data to display  ED Course  I have reviewed the triage vital signs and the nursing notes.  Pertinent labs & imaging results that were available during my care of the patient were reviewed by me and considered in my medical decision making (see chart for details).    MDM Rules/Calculators/A&P                      Exam suggestive of a trigger finger of the right ring finger.  X-rays without any significant abnormalities.  We will have her follow-up with hand surgery.    Final Clinical Impression(s) / ED Diagnoses Final diagnoses:   Trigger middle finger of right hand    Rx / DC Orders ED Discharge Orders    None       Vanetta Mulders, MD 04/01/20 2037

## 2020-04-01 NOTE — Discharge Instructions (Addendum)
Patient is with symptoms to the right ring finger suggestive of trigger finger.  X-rays showed no significant bony abnormalities.  Call hand surgery for follow-up on Monday and call to set up an appointment.

## 2020-04-02 ENCOUNTER — Encounter (HOSPITAL_COMMUNITY): Payer: Self-pay

## 2020-04-02 ENCOUNTER — Other Ambulatory Visit: Payer: Self-pay

## 2020-04-02 ENCOUNTER — Ambulatory Visit (HOSPITAL_COMMUNITY)
Admission: EM | Admit: 2020-04-02 | Discharge: 2020-04-02 | Disposition: A | Discharge status: Home / Self Care | Payer: Medicare HMO | Attending: Emergency Medicine | Admitting: Emergency Medicine

## 2020-04-02 DIAGNOSIS — N760 Acute vaginitis: Secondary | ICD-10-CM | POA: Insufficient documentation

## 2020-04-02 LAB — POCT URINALYSIS DIP (DEVICE)
Bilirubin Urine: NEGATIVE
Glucose, UA: 100 mg/dL — AB
Hgb urine dipstick: NEGATIVE
Ketones, ur: NEGATIVE mg/dL
Nitrite: NEGATIVE
Protein, ur: NEGATIVE mg/dL
Specific Gravity, Urine: >=1.03 (ref 1.005–1.030)
Urobilinogen, UA: 0.2 mg/dL (ref 0.0–1.0)
pH: 5.5 (ref 5.0–8.0)

## 2020-04-02 MED ORDER — NYSTATIN 100000 UNIT/GM EX CREA: TOPICAL_CREAM | CUTANEOUS | 1 refills | Status: DC

## 2020-04-02 NOTE — Discharge Instructions (Signed)
Use the cream as prescribed for possible yeast infection. Do not believe this is a urinary tract infection but will send urine for culture. Follow up as needed for continued or worsening symptoms

## 2020-04-02 NOTE — ED Triage Notes (Signed)
Pt present frequency urination with possible yeast infection. Symptoms started yesterday. Pt states that she is having some burning sensation with itching.

## 2020-04-02 NOTE — ED Provider Notes (Signed)
[facility]    CSN: [id number] Arrival date & time: 04/02/20  1014      History   Chief Complaint Chief Complaint  Patient presents with  . Urinary Tract Infection  . Vaginitis    HPI Jacqueline Ball is a 79 y.o. female.   Patient is a 79 year old female with past medical history of anxiety, A. fib, diabetes, hyperlipidemia, hypertension memory loss, mood disorder, GERD, dementia.  She presents today for approximate 3 days of vaginal itching, irritation and mild discomfort with urination.  Symptoms have been constant.  She has not taken anything for the symptoms.  Denies any urinary frequency or hematuria.  No abdominal pain, back pain, fever, nausea or vomiting.  ROS per HPI      Past Medical History:  Diagnosis Date  . Anxiety   . Atrial fibrillation ([place])   . Diabetes mellitus   . Hyperlipidemia   . Hypertension   . Memory loss   . Mood disorder ([place])   . Physical exam, annual 10/22/06  . Renal cyst     Patient Active Problem List   Diagnosis Date Noted  . Acute midline low back pain without sciatica 11/29/2019  . GERD (gastroesophageal reflux disease) 05/12/2019  . Arthritis 05/12/2019  . Dementia ([place]) 05/12/2019  . Hyperlipidemia 05/12/2019  . Overactive bladder 05/12/2019  . Chronic anticoagulation 03/18/2018  . Late onset Alzheimer's disease without behavioral disturbance ([place]) 01/21/2017  . Chronic atrial fibrillation ([place]) 11/02/2015  . Essential hypertension 11/02/2015  . Type 2 diabetes mellitus without complication, without long-term current use of insulin ([place]) 11/02/2015  . Cognitive impairment 10/31/2015  . Chest pain 11/22/2014  . Muscle cramps 04/17/2012  . Bilateral shoulder pain 03/09/2012  . Essential hypertension 03/28/2011  . Diabetes mellitus type 2, noninsulin dependent ([place]) 03/28/2011  . 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 ABDOMINAL HYSTERECTOMY      OB  History   No obstetric history on file.      Home Medications    Prior to Admission medications   Medication Sig Start Date End Date Taking? Authorizing Provider  acetaminophen (TYLENOL) 500 MG tablet Take 500 mg by mouth every 6 (six) hours as needed for pain.    [provider]  apixaban (ELIQUIS) 5 MG TABS tablet Take 1 tablet (5 mg total) by mouth 2 (two) times daily. 11/24/14   Charolette Forward, MD  atenolol (TENORMIN) 50 MG tablet Take 50 mg by mouth daily. 07/29/19   [provider]  atorvastatin (LIPITOR) 40 MG tablet Take 40 mg by mouth at bedtime.    [provider]  azelastine (ASTELIN) 0.1 % nasal spray Place 1 spray into both nostrils as needed for rhinitis or allergies.  05/24/18   [provider]  cyclobenzaprine (FLEXERIL) 10 MG tablet Take 1 tablet (10 mg total) by mouth 2 (two) times daily as needed for muscle spasms. 11/29/19   Wardell Honour, MD  dicyclomine (BENTYL) 20 MG tablet Take 20 mg by mouth 3 (three) times daily as needed for spasms. 08/29/18   [provider]  donepezil (ARICEPT) 10 MG tablet Take 1 tablet (10 mg total) by mouth at bedtime. 06/06/17   Rosalin Hawking, MD  esomeprazole (NEXIUM) 40 MG capsule Take 40 mg by mouth daily at 12 noon.    [provider]  estradiol (CLIMARA) 0.06 MG/24HR Place 0.06 patches onto the skin once a week. 08/12/17   [provider]  fluticasone (FLONASE) 50 MCG/ACT nasal spray Place 1 spray into both nostrils daily. 09/15/18   Dejaun Vidrio, Gloris Manchester A, NP  glimepiride (AMARYL) 4 MG tablet Take 4 mg by mouth daily with breakfast.    [provider]  HYDROcodone-acetaminophen (NORCO/VICODIN) 5-325 MG tablet Take 2 tablets by mouth every 4 (four) hours as needed. 11/29/19   Frederica Kuster, MD  metFORMIN (GLUCOPHAGE) 500 MG tablet Take 500 mg by mouth 2 (two) times daily with a meal.  08/23/15   [provider]  MYRBETRIQ 50 MG TB24 tablet Take 50 mg by mouth daily.  07/29/19   [provider]  nystatin cream (MYCOSTATIN) Apply to affected area 2 times daily 04/02/20   Dahlia Byes A, NP  omeprazole (PRILOSEC) 40 MG capsule Take 40 mg by mouth daily. 11/27/19   [provider]  pantoprazole (PROTONIX) 20 MG tablet Take 1 tablet (20 mg total) by mouth daily. 02/24/20   Coralyn Mark, NP  rosuvastatin (CRESTOR) 10 MG tablet Take 10 mg by mouth at bedtime. 09/14/19   [provider]  sulfamethoxazole-trimethoprim (BACTRIM DS) 800-160 MG tablet Take 1 tablet by mouth 2 (two) times daily. 03/12/20   Wallis Bamberg, PA-C  traMADol (ULTRAM) 50 MG tablet Take 50 mg by mouth 2 (two) times daily as needed. for pain 09/24/19   [provider]  VESICARE 10 MG tablet Take 10 mg by mouth daily.  01/17/18   [provider]    Family History Family History  Problem Relation Age of Onset  . Diabetes Sister     Social History Social History   Tobacco Use  . Smoking status: Never Smoker  . Smokeless tobacco: Never Used  Substance Use Topics  . Alcohol use: No  . Drug use: No     Allergies   Metoprolol   Review of Systems Review of Systems   Physical Exam Triage Vital Signs ED Triage Vitals  Enc Vitals Group     BP 04/02/20 1039 109/61     Pulse Rate 04/02/20 1039 71     Resp 04/02/20 1039 16     Temp 04/02/20 1039 (!) 97.4 F (36.3 C)     Temp Source 04/02/20 1039 Oral     SpO2 04/02/20 1039 100 %     Weight --      Height --      Head Circumference --      Peak Flow --      Pain Score 04/02/20 1038 0     Pain Loc --      Pain Edu? --      Excl. in GC? --    No data found.  Updated Vital Signs BP 109/61 (BP Location: Right Arm)   Pulse 71   Temp (!) 97.4 F (36.3 C) (Oral)   Resp 16   SpO2 100%   Visual Acuity Right Eye Distance:   Left Eye Distance:   Bilateral Distance:    Right Eye Near:   Left Eye Near:    Bilateral Near:     Physical Exam Vitals and nursing note reviewed.   Constitutional:      General: She is not in acute distress.    Appearance: Normal appearance. She is not ill-appearing, toxic-appearing or diaphoretic.  HENT:     Head: Normocephalic.     Nose: Nose normal.  Eyes:     Conjunctiva/sclera: Conjunctivae normal.  Pulmonary:     Effort: Pulmonary effort is normal.  Genitourinary:  Comments: Deferred  Musculoskeletal:        General: Normal range of motion.     Cervical back: Normal range of motion.  Skin:    General: Skin is warm and dry.     Findings: No rash.  Neurological:     Mental Status: She is alert.  Psychiatric:        Mood and Affect: Mood normal.      UC Treatments / Results  Labs (all labs ordered are listed, but only abnormal results are displayed) Labs Reviewed  POCT URINALYSIS DIP (DEVICE) - Abnormal; Notable for the following components:      Result Value   Glucose, UA 100 (*)    Leukocytes,Ua TRACE (*)    All other components within normal limits  URINE CULTURE    EKG   Radiology DG Hand Complete Right  Result Date: 04/01/2020 CLINICAL DATA:  Ring finger pain extending into palm for 1 week, no known injury EXAM: RIGHT HAND - COMPLETE 3+ VIEW COMPARISON:  None FINDINGS: No acute bony abnormality. Specifically, no fracture, subluxation, or dislocation. No focal abnormality of the fourth digit. Mild degenerative changes in the hand most notably at the first carpometacarpal and triscaphe joint. Soft tissues are unremarkable. IMPRESSION: No acute bony abnormality. Mild degenerative changes in the hand. No focal abnormality of the fourth digit. Electronically Signed   By: Kreg Shropshire M.D.   On: 04/01/2020 19:19    Procedures Procedures (including critical care time)  Medications Ordered in UC Medications - No data to display  Initial Impression / Assessment and Plan / UC Course  I have reviewed the triage vital signs and the nursing notes.  Pertinent labs & imaging results that were available  during my care of the patient were reviewed by me and considered in my medical decision making (see chart for details).     Vaginitis Urine with trace leuks.  Sending for culture.  Not convinced of UTI at this time.  More likely vaginitis from yeast infection.  Will treat with nystatin Follow up as needed for continued or worsening symptoms  Final Clinical Impressions(s) / UC Diagnoses   Final diagnoses:  Vaginitis and vulvovaginitis     Discharge Instructions     Use the cream as prescribed for possible yeast infection. Do not believe this is a urinary tract infection but will send urine for culture. Follow up as needed for continued or worsening symptoms     ED Prescriptions    Medication Sig Dispense Auth. Provider   nystatin cream (MYCOSTATIN) Apply to affected area 2 times daily 30 g Sheldon Amara A, NP     PDMP not reviewed this encounter.   Janace Aris, NP 04/02/20 1058

## 2020-04-04 LAB — URINE CULTURE: Culture: 30000 — AB

## 2020-06-06 ENCOUNTER — Ambulatory Visit (HOSPITAL_COMMUNITY)
Admission: EM | Admit: 2020-06-06 | Discharge: 2020-06-06 | Disposition: A | Discharge status: Home / Self Care | Payer: Medicare HMO | Attending: Family Medicine | Admitting: Family Medicine

## 2020-06-06 ENCOUNTER — Other Ambulatory Visit: Payer: Self-pay

## 2020-06-06 DIAGNOSIS — R252 Cramp and spasm: Secondary | ICD-10-CM | POA: Diagnosis not present

## 2020-06-06 LAB — BASIC METABOLIC PANEL
Anion gap: 10 (ref 5–15)
BUN: 12 mg/dL (ref 8–23)
CO2: 25 mmol/L (ref 22–32)
Calcium: 9.1 mg/dL (ref 8.9–10.3)
Chloride: 106 mmol/L (ref 98–111)
Creatinine, Ser: 0.91 mg/dL (ref 0.44–1.00)
GFR calc Af Amer: >60 mL/min (ref >60)
GFR calc non Af Amer: >60 mL/min (ref >60)
Glucose, Bld: 102 mg/dL — ABNORMAL HIGH (ref 70–99)
Potassium: 3.9 mmol/L (ref 3.5–5.1)
Sodium: 141 mmol/L (ref 135–145)

## 2020-06-06 LAB — MAGNESIUM: Magnesium: 2 mg/dL (ref 1.7–2.4)

## 2020-06-06 MED ORDER — CYCLOBENZAPRINE HCL 5 MG PO TABS: 5.0000 mg | ORAL_TABLET | Freq: Every day | ORAL | 0 refills | Status: DC

## 2020-06-06 NOTE — ED Triage Notes (Signed)
Patient reports leg cramps in the bilateral feet and calves.

## 2020-06-06 NOTE — Discharge Instructions (Signed)
Please try using flexeril before bedtime for cramping Try placing soap under fitted sheet at base of bed I will call if blood work abnormal Follow up with primary care or sports medicine if continuing

## 2020-06-07 NOTE — ED Provider Notes (Signed)
[facility]    CSN: [id number] Arrival date & time: 06/06/20  1755      History   Chief Complaint Chief Complaint  Patient presents with  . leg cramps    HPI Jacqueline Ball is a 79 y.o. female history of A. fib on Eliquis, DM type II, hypertension, hyperlipidemia, presenting today for evaluation of bilateral leg and feet cramping.  Patient reports that over the past few months she has had increased pain and cramping in her legs and feet.  Will start in her arch and shoot upward.  More notable at nighttime.  Has been using Tylenol and ibuprofen without relief.  She reports her blood sugars have been around 90 of recently.  Denies any injury or trauma.  Denies any swelling.  Denies paresthesias.  HPI  Past Medical History:  Diagnosis Date  . Anxiety   . Atrial fibrillation (HCC)   . Diabetes mellitus   . Hyperlipidemia   . Hypertension   . Memory loss   . Mood disorder (HCC)   . Physical exam, annual 10/22/06  . Renal cyst     Patient Active Problem List   Diagnosis Date Noted  . Acute midline low back pain without sciatica 11/29/2019  . GERD (gastroesophageal reflux disease) 05/12/2019  . Arthritis 05/12/2019  . Dementia (HCC) 05/12/2019  . Hyperlipidemia 05/12/2019  . Overactive bladder 05/12/2019  . Chronic anticoagulation 03/18/2018  . 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 Chronic atrial fibrillation (HCC) 11/02/2015  . Essential hypertension 11/02/2015  . Type 2 diabetes mellitus without complication, without long-term current use of insulin (HCC) 11/02/2015  . Cognitive impairment 10/31/2015  . Chest pain 11/22/2014  . Muscle cramps 04/17/2012  . Bilateral shoulder pain 03/09/2012  . Essential hypertension 03/28/2011  . Diabetes mellitus type 2, noninsulin dependent (HCC) 03/28/2011  . 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 ABDOMINAL HYSTERECTOMY       OB History   No obstetric history on file.      Home Medications    Prior to Admission medications   Medication Sig Start Date End Date Taking? Authorizing Provider  acetaminophen (TYLENOL) 500 MG tablet Take 500 mg by mouth every 6 (six) hours as needed for pain.    [provider]  apixaban (ELIQUIS) 5 MG TABS tablet Take 1 tablet (5 mg total) by mouth 2 (two) times daily. 11/24/14   Rinaldo Cloud, MD  atenolol (TENORMIN) 50 MG tablet Take 50 mg by mouth daily. 07/29/19   [provider]  atorvastatin (LIPITOR) 40 MG tablet Take 40 mg by mouth at bedtime.    [provider]  azelastine (ASTELIN) 0.1 % nasal spray Place 1 spray into both nostrils as needed for rhinitis or allergies.  05/24/18   [provider]  cyclobenzaprine (FLEXERIL) 5 MG tablet Take 1-2 tablets (5-10 mg total) by mouth at bedtime. 06/06/20   Kirkland Figg C, PA-C  dicyclomine (BENTYL) 20 MG tablet Take 20 mg by mouth 3 (three) times daily as needed for spasms. 08/29/18   [provider]  donepezil (ARICEPT) 10 MG tablet Take 1 tablet (10 mg total) by mouth at bedtime. 06/06/17   Marvel Plan, MD  esomeprazole (NEXIUM) 40 MG capsule Take 40 mg by mouth daily at 12 noon.    [provider]  estradiol (CLIMARA) 0.06 MG/24HR Place 0.06 patches onto the skin once a week. 08/12/17   [provider]  fluticasone (FLONASE) 50 MCG/ACT nasal spray Place 1 spray into both nostrils daily. 09/15/18   Bast, Tressia Miners A, NP  glimepiride (AMARYL) 4 MG tablet Take 4 mg by mouth daily with breakfast.    [provider]  HYDROcodone-acetaminophen (NORCO/VICODIN) 5-325 MG tablet Take 2 tablets by mouth every 4 (four) hours as needed. 11/29/19   Wardell Honour, MD  metFORMIN (GLUCOPHAGE) 500 MG tablet Take 500 mg by mouth 2 (two) times daily with a meal.  08/23/15   [provider]  MYRBETRIQ 50 MG TB24 tablet Take 50 mg by mouth daily. 07/29/19   [provider]  nystatin cream (MYCOSTATIN) Apply to affected area 2 times daily 04/02/20   Loura Halt A, NP  omeprazole (PRILOSEC) 40 MG capsule Take 40 mg by mouth daily. 11/27/19   [provider]  pantoprazole (PROTONIX) 20 MG tablet Take 1 tablet (20 mg total) by mouth daily. 02/24/20   Marney Setting, NP  rosuvastatin (CRESTOR) 10 MG tablet Take 10 mg by mouth at bedtime. 09/14/19   [provider]  sulfamethoxazole-trimethoprim (BACTRIM DS) 800-160 MG tablet Take 1 tablet by mouth 2 (two) times daily. 03/12/20   Jaynee Eagles, PA-C  traMADol (ULTRAM) 50 MG tablet Take 50 mg by mouth 2 (two) times daily as needed. for pain 09/24/19   [provider]  VESICARE 10 MG tablet Take 10 mg by mouth daily.  01/17/18   [provider]    Family History Family History  Problem Relation Age of Onset  . Diabetes Sister     Social History Social History   Tobacco Use  . Smoking status: Never Smoker  . Smokeless tobacco: Never Used  Substance Use Topics  . Alcohol use: No  . Drug use: No     Allergies   Metoprolol   Review of Systems Review of Systems  Constitutional: Negative for fatigue and fever.  Eyes: Negative for visual disturbance.  Respiratory: Negative for shortness of breath.   Cardiovascular: Negative for chest pain.  Gastrointestinal: Negative for abdominal pain, nausea and vomiting.  Musculoskeletal: Positive for myalgias. Negative for arthralgias and joint swelling.  Skin: Negative for color change, rash and wound.  Neurological: Negative for dizziness, weakness, light-headedness and headaches.     Physical Exam Triage Vital Signs ED Triage Vitals  Enc Vitals Group     BP 06/06/20 1946 (!) 115/91     Pulse Rate 06/06/20 1946 (!) 50     Resp 06/06/20 1946 16     Temp 06/06/20 1946 98.3 F (36.8 C)     Temp Source 06/06/20 1946 Oral     SpO2 06/06/20 1946 100 %     Weight --      Height --      Head Circumference --       Peak Flow --      Pain Score 06/06/20 1945 7     Pain Loc --      Pain Edu? --      Excl. in [place]? --    No data found.  Updated Vital Signs BP (!) 115/91 (BP Location: Right Arm)   Pulse (!) 50   Temp 98.3 F (36.8 C) (Oral)   Resp 16   SpO2 100%   Visual Acuity Right Eye Distance:   Left Eye Distance:   Bilateral Distance:    Right Eye Near:   Left Eye Near:    Bilateral Near:     Physical Exam  Vitals and nursing note reviewed.  Constitutional:      Appearance: She is well-developed.     Comments: No acute distress  HENT:     Head: Normocephalic and atraumatic.     Nose: Nose normal.  Eyes:     Conjunctiva/sclera: Conjunctivae normal.  Cardiovascular:     Rate and Rhythm: Normal rate.  Pulmonary:     Effort: Pulmonary effort is normal. No respiratory distress.  Abdominal:     General: There is no distension.  Musculoskeletal:        General: Normal range of motion.     Cervical back: Neck supple.     Comments: Bilateral lower extremities without swelling or erythema, minimal tenderness to palpation throughout dorsum of foot, mild tenderness palpation in arch of foot bilaterally, nontender along Achilles, calf or around knee  Full active range of motion of bilateral knees and ankle Dorsalis pedis 2+, cap refill brisk  Skin:    General: Skin is warm and dry.  Neurological:     Mental Status: She is alert and oriented to person, place, and time.      UC Treatments / Results  Labs (all labs ordered are listed, but only abnormal results are displayed) Labs Reviewed  BASIC METABOLIC PANEL - Abnormal; Notable for the following components:      Result Value   Glucose, Bld 102 (*)    All other components within normal limits  MAGNESIUM    EKG   Radiology No results found.  Procedures Procedures (including critical care time)  Medications Ordered in UC Medications - No data to display  Initial Impression / Assessment and Plan / UC Course  I have  reviewed the triage vital signs and the nursing notes.  Pertinent labs & imaging results that were available during my care of the patient were reviewed by me and considered in my medical decision making (see chart for details).     Nighttime cramping, will check BMP and magnesium to check electrolytes as well as glucose.  Blood work reassuring.  Will provide Flexeril to try to help with cramping.  Be sure to drink plenty of fluids/hydration.  Follow-up with PCP if persisting.  Discussed strict return precautions. Patient verbalized understanding and is agreeable with plan.  Final Clinical Impressions(s) / UC Diagnoses   Final diagnoses:  Leg cramps     Discharge Instructions     Please try using flexeril before bedtime for cramping Try placing soap under fitted sheet at base of bed I will call if blood work abnormal Follow up with primary care or sports medicine if continuing   ED Prescriptions    Medication Sig Dispense Auth. Provider   cyclobenzaprine (FLEXERIL) 5 MG tablet  (Status: Discontinued) Take 1-2 tablets (5-10 mg total) by mouth at bedtime. 24 tablet Vaniah Chambers C, PA-C   cyclobenzaprine (FLEXERIL) 5 MG tablet Take 1-2 tablets (5-10 mg total) by mouth at bedtime. 24 tablet Rosalyn Archambault, [place] C, PA-C     PDMP not reviewed this encounter.   Lew Dawes, PA-C 06/07/20 1316

## 2020-07-16 ENCOUNTER — Encounter (HOSPITAL_COMMUNITY): Payer: Self-pay

## 2020-07-16 ENCOUNTER — Ambulatory Visit (HOSPITAL_COMMUNITY)
Admission: EM | Admit: 2020-07-16 | Discharge: 2020-07-16 | Disposition: A | Discharge status: Home / Self Care | Payer: Medicare Other | Attending: Family Medicine | Admitting: Family Medicine

## 2020-07-16 ENCOUNTER — Other Ambulatory Visit: Payer: Self-pay

## 2020-07-16 DIAGNOSIS — M79671 Pain in right foot: Secondary | ICD-10-CM | POA: Diagnosis not present

## 2020-07-16 DIAGNOSIS — M79672 Pain in left foot: Secondary | ICD-10-CM

## 2020-07-16 MED ORDER — MELOXICAM 7.5 MG PO TABS: 7.5000 mg | ORAL_TABLET | Freq: Every day | ORAL | 0 refills | Status: DC

## 2020-07-16 NOTE — ED Triage Notes (Signed)
Pt c/o bilateral foot pain for several weeks. States she has chronic "dry skin" and feet are cracking. Pt has diabetes and has been applying vaseline to feet. Sore noted to left lateral heel area.   Pt states she has been taking 10 "motrin" at a time for a total of 2000mg  at once. Pt states she was evaluated by another doctor yesterday for stomach pain.

## 2020-07-18 NOTE — ED Provider Notes (Signed)
[facility]   [id number] 07/16/20 Arrival Time: 1539  ASSESSMENT & PLAN:  1. Heel pain, bilateral     No indication for plain imaging of feet at this time. Suspect plantar fasciitis. Discussed.  Begin: Meds ordered this encounter  Medications  . meloxicam (MOBIC) 7.5 MG tablet    Sig: Take 1 tablet (7.5 mg total) by mouth daily.    Dispense:  7 tablet    Refill:  0   Recommend:  Follow-up Information    [facility].   Why: If worsening or failing to improve as anticipated. Contact information: [address] [place] [address] [phone]                Reviewed expectations re: course of current medical issues. Questions answered. Outlined signs and symptoms indicating need for more acute intervention. Patient verbalized understanding. After Visit Summary given.  SUBJECTIVE: History from: patient. Jacqueline Ball is a 79 y.o. female who reports bilateral heel pain; several weeks; off/of; worse in morning; able to bear weight but with discomfort; dry skin over heels; no trauma; Vaseline to feet without relief. No extremity sensation changes or weakness.    Past Surgical History:  Procedure Laterality Date  . ABDOMINAL HYSTERECTOMY        OBJECTIVE:  Vitals:   07/16/20 1652  BP: 125/70  Pulse: 80  Resp: 18  Temp: 97.8 F (36.6 C)  TempSrc: Oral  SpO2: 100%    General appearance: alert; no distress HEENT: [place]; AT Neck: supple with FROM Resp: unlabored respirations Extremities: . Bilateral feet: plantar TTP over fascia insertion; otherwise normal CV: brisk extremity capillary refill of bilateral UE; 2+ DP pulse of bilateral UE. Skin: warm and dry; no visible rashes Neurologic: gait normal; normal sensation and strength of bilateral LE Psychological: alert and cooperative; normal mood and affect    Allergies  Allergen Reactions  . Metoprolol Nausea And Vomiting    Past Medical  History:  Diagnosis Date  . Anxiety   . Atrial fibrillation (HCC)   . Diabetes mellitus   . Hyperlipidemia   . Hypertension   . Memory loss   . Mood disorder (HCC)   . Physical exam, annual 10/22/06  . Renal cyst    Social History   Socioeconomic History  . Marital status: Married    Spouse name: Jacqueline Ball  . Number of children: Not on file  . Years of education: Not on file  . Highest education level: Not on file  Occupational History  . Not on file  Tobacco Use  . Smoking status: Never Smoker  . Smokeless tobacco: Never Used  Substance and Sexual Activity  . Alcohol use: No  . Drug use: No  . Sexual activity: Not on file  Other Topics Concern  . Not on file  Social History Narrative  . Not on file   Social Determinants of Health   Financial Resource Strain:   . Difficulty of Paying Living Expenses:   Food Insecurity:   . Worried About Programme researcher, broadcasting/film/video in the Last Year:   . Barista in the Last Year:   Transportation Needs:   . Freight forwarder (Medical):   [name] Lack of Transportation (Non-Medical):   Physical Activity:   . Days of Exercise per Week:   . Minutes of Exercise per Session:   Stress:   . Feeling of Stress :   Social Connections:   . Frequency  of Communication with Friends and Family:   . Frequency of Social Gatherings with Friends and Family:   . Attends Religious Services:   . Active Member of Clubs or Organizations:   . Attends Banker Meetings:   [name] Marital Status:    Family History  Problem Relation Age of Onset  . Diabetes Sister    Past Surgical History:  Procedure Laterality Date  . ABDOMINAL HYSTERECTOMY        Jacqueline Layman, MD 07/18/20 [phone]

## 2020-10-07 ENCOUNTER — Ambulatory Visit: Payer: Medicare Other

## 2021-01-04 ENCOUNTER — Emergency Department (HOSPITAL_COMMUNITY)
Admission: EM | Admit: 2021-01-04 | Discharge: 2021-01-04 | Disposition: A | Discharge status: Home / Self Care | Payer: Medicare HMO | Attending: Emergency Medicine | Admitting: Emergency Medicine

## 2021-01-04 ENCOUNTER — Encounter (HOSPITAL_COMMUNITY): Payer: Self-pay | Admitting: Emergency Medicine

## 2021-01-04 ENCOUNTER — Other Ambulatory Visit: Payer: Self-pay

## 2021-01-04 DIAGNOSIS — R6884 Jaw pain: Secondary | ICD-10-CM | POA: Insufficient documentation

## 2021-01-04 DIAGNOSIS — Z5321 Procedure and treatment not carried out due to patient leaving prior to being seen by health care provider: Secondary | ICD-10-CM | POA: Diagnosis not present

## 2021-01-04 NOTE — ED Triage Notes (Signed)
Pt here from home with c/o jaw pain , states that she will be getting dental implants , pt also states that she thinks that she may have a yeast infection and she has not been sleeping well at night

## 2021-01-04 NOTE — ED Notes (Signed)
Pt left due to not being seen quick enough 

## 2021-01-12 ENCOUNTER — Emergency Department (HOSPITAL_COMMUNITY)
Admission: EM | Admit: 2021-01-12 | Discharge: 2021-01-12 | Disposition: A | Discharge status: Home / Self Care | Payer: Medicare HMO | Attending: Emergency Medicine | Admitting: Emergency Medicine

## 2021-01-12 ENCOUNTER — Other Ambulatory Visit: Payer: Self-pay

## 2021-01-12 ENCOUNTER — Encounter (HOSPITAL_COMMUNITY): Payer: Self-pay

## 2021-01-12 DIAGNOSIS — Z79899 Other long term (current) drug therapy: Secondary | ICD-10-CM | POA: Insufficient documentation

## 2021-01-12 DIAGNOSIS — R070 Pain in throat: Secondary | ICD-10-CM | POA: Insufficient documentation

## 2021-01-12 DIAGNOSIS — R5383 Other fatigue: Secondary | ICD-10-CM

## 2021-01-12 DIAGNOSIS — M791 Myalgia, unspecified site: Secondary | ICD-10-CM | POA: Insufficient documentation

## 2021-01-12 DIAGNOSIS — F0390 Unspecified dementia without behavioral disturbance: Secondary | ICD-10-CM | POA: Diagnosis not present

## 2021-01-12 DIAGNOSIS — Z20822 Contact with and (suspected) exposure to covid-19: Secondary | ICD-10-CM | POA: Insufficient documentation

## 2021-01-12 DIAGNOSIS — I10 Essential (primary) hypertension: Secondary | ICD-10-CM | POA: Diagnosis not present

## 2021-01-12 DIAGNOSIS — R739 Hyperglycemia, unspecified: Secondary | ICD-10-CM

## 2021-01-12 DIAGNOSIS — Z7984 Long term (current) use of oral hypoglycemic drugs: Secondary | ICD-10-CM | POA: Diagnosis not present

## 2021-01-12 DIAGNOSIS — Z7901 Long term (current) use of anticoagulants: Secondary | ICD-10-CM | POA: Insufficient documentation

## 2021-01-12 DIAGNOSIS — E1165 Type 2 diabetes mellitus with hyperglycemia: Secondary | ICD-10-CM | POA: Insufficient documentation

## 2021-01-12 LAB — CBC WITH DIFFERENTIAL/PLATELET
Abs Immature Granulocytes: 0.01 10*3/uL (ref 0.00–0.07)
Basophils Absolute: 0 10*3/uL (ref 0.0–0.1)
Basophils Relative: 0 %
Eosinophils Absolute: 0 10*3/uL (ref 0.0–0.5)
Eosinophils Relative: 0 %
HCT: 41.8 % (ref 36.0–46.0)
Hemoglobin: 13.9 g/dL (ref 12.0–15.0)
Immature Granulocytes: 0 %
Lymphocytes Relative: 30 %
Lymphs Abs: 2.1 10*3/uL (ref 0.7–4.0)
MCH: 32.3 pg (ref 26.0–34.0)
MCHC: 33.3 g/dL (ref 30.0–36.0)
MCV: 97 fL (ref 80.0–100.0)
Monocytes Absolute: 0.6 10*3/uL (ref 0.1–1.0)
Monocytes Relative: 9 %
Neutro Abs: 4.2 10*3/uL (ref 1.7–7.7)
Neutrophils Relative %: 61 %
Platelets: 325 10*3/uL (ref 150–400)
RBC: 4.31 MIL/uL (ref 3.87–5.11)
RDW: 12.1 % (ref 11.5–15.5)
WBC: 6.9 10*3/uL (ref 4.0–10.5)
nRBC: 0 % (ref 0.0–0.2)

## 2021-01-12 LAB — CK: Total CK: 83 U/L (ref 38–234)

## 2021-01-12 LAB — BASIC METABOLIC PANEL
Anion gap: 8 (ref 5–15)
BUN: 14 mg/dL (ref 8–23)
CO2: 23 mmol/L (ref 22–32)
Calcium: 9.3 mg/dL (ref 8.9–10.3)
Chloride: 101 mmol/L (ref 98–111)
Creatinine, Ser: 0.97 mg/dL (ref 0.44–1.00)
GFR, Estimated: 60 mL/min — ABNORMAL LOW (ref >60)
Glucose, Bld: 407 mg/dL — ABNORMAL HIGH (ref 70–99)
Potassium: 4.5 mmol/L (ref 3.5–5.1)
Sodium: 132 mmol/L — ABNORMAL LOW (ref 135–145)

## 2021-01-12 LAB — GROUP A STREP BY PCR: Group A Strep by PCR: NOT DETECTED

## 2021-01-12 NOTE — ED Provider Notes (Signed)
[facility]ENC[facility]PT Provider Note   CSN: [id number] Arrival date & time: 01/12/21  1513     History Chief Complaint  Patient presents with  . Fatigue  . Sore Throat    Jacqueline Ball is a 80 y.o. female.  Patient claims to have fatigue body aches poor p.o. intake and weight loss over the last 2 weeks.  Worse last week improving.  She used at home pain patches for her musculoskeletal pain aches and they helped significantly.  However she still fatigued has burning with eating in her mouth and throat.  Has been losing weight due to poor appetite.  No sick contacts, no fevers chills no chest pain no difficulty breathing.        Past Medical History:  Diagnosis Date  . Anxiety   . Atrial fibrillation (HCC)   . Diabetes mellitus   . Hyperlipidemia   . Hypertension   . Memory loss   . Mood disorder (HCC)   . Physical exam, annual 10/22/06  . Renal cyst     Patient Active Problem List   Diagnosis Date Noted  . Acute midline low back pain without sciatica 11/29/2019  . GERD (gastroesophageal reflux disease) 05/12/2019  . Arthritis 05/12/2019  . Dementia (HCC) 05/12/2019  . Hyperlipidemia 05/12/2019  . Overactive bladder 05/12/2019  . Chronic anticoagulation 03/18/2018  . 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 Chronic atrial fibrillation (HCC) 11/02/2015  . Essential hypertension 11/02/2015  . Type 2 diabetes mellitus without complication, without long-term current use of insulin (HCC) 11/02/2015  . Cognitive impairment 10/31/2015  . Chest pain 11/22/2014  . Muscle cramps 04/17/2012  . Bilateral shoulder pain 03/09/2012  . Essential hypertension 03/28/2011  . Diabetes mellitus type 2, noninsulin dependent (HCC) 03/28/2011  . 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 ABDOMINAL HYSTERECTOMY       OB History   No obstetric history on file.      Family History  Problem Relation Age of Onset  . Diabetes Sister     Social History   Tobacco Use  . Smoking status: Never Smoker  . Smokeless tobacco: Never Used  Vaping Use  . Vaping Use: Never used  Substance Use Topics  . Alcohol use: No  . Drug use: No    Home Medications Prior to Admission medications   Medication Sig Start Date End Date Taking? Authorizing Provider  acetaminophen (TYLENOL) 500 MG tablet Take 500 mg by mouth every 6 (six) hours as needed for pain.    [provider]  apixaban (ELIQUIS) 5 MG TABS tablet Take 1 tablet (5 mg total) by mouth 2 (two) times daily. 11/24/14   Rinaldo Cloud, MD  atenolol (TENORMIN) 50 MG tablet Take 50 mg by mouth daily. 07/29/19   [provider]  atorvastatin (LIPITOR) 40 MG tablet Take 40 mg by mouth at bedtime.    [provider]  azelastine (ASTELIN) 0.1 % nasal spray Place 1 spray into both nostrils as needed for rhinitis or allergies.  05/24/18   [provider]  cyclobenzaprine (FLEXERIL) 5 MG tablet Take 1-2 tablets (5-10 mg total) by mouth at bedtime. 06/06/20   Wieters, Hallie C, PA-C  dicyclomine (BENTYL) 20 MG tablet Take 20 mg by mouth 3 (three) times daily as needed for spasms. 08/29/18   [provider]  donepezil (ARICEPT) 10 MG tablet Take 1 tablet (10 mg total) by mouth at  bedtime. 06/06/17   Marvel Plan, MD  esomeprazole (NEXIUM) 40 MG capsule Take 40 mg by mouth daily at 12 noon.    [provider]  estradiol (CLIMARA) 0.06 MG/24HR Place 0.06 patches onto the skin once a week. 08/12/17   [provider]  fluticasone (FLONASE) 50 MCG/ACT nasal spray Place 1 spray into both nostrils daily. 09/15/18   Bast, Gloris [place] A, NP  glimepiride (AMARYL) 4 MG tablet Take 4 mg by mouth daily with breakfast.    [provider]  HYDROcodone-acetaminophen (NORCO/VICODIN) 5-325 MG tablet Take 2 tablets by mouth every 4 (four) hours as needed. 11/29/19   Frederica Kuster, MD  losartan (COZAAR) 25 MG tablet Take 25 mg by mouth daily. 05/03/20   [provider]  meloxicam (MOBIC) 7.5 MG tablet Take 1 tablet (7.5 mg total) by mouth daily. 07/16/20   Mardella Layman, MD  metFORMIN (GLUCOPHAGE) 500 MG tablet Take 500 mg by mouth 2 (two) times daily with a meal.  08/23/15   [provider]  MYRBETRIQ 50 MG TB24 tablet Take 50 mg by mouth daily. 07/29/19   [provider]  nystatin cream (MYCOSTATIN) Apply to affected area 2 times daily 04/02/20   Dahlia Byes A, NP  omeprazole (PRILOSEC) 40 MG capsule Take 40 mg by mouth daily. 11/27/19   [provider]  pantoprazole (PROTONIX) 20 MG tablet Take 1 tablet (20 mg total) by mouth daily. 02/24/20   Coralyn Mark, NP  rosuvastatin (CRESTOR) 10 MG tablet Take 10 mg by mouth at bedtime. 09/14/19   [provider]  sulfamethoxazole-trimethoprim (BACTRIM DS) 800-160 MG tablet Take 1 tablet by mouth 2 (two) times daily. 03/12/20   Wallis Bamberg, PA-C  traMADol (ULTRAM) 50 MG tablet Take 50 mg by mouth 2 (two) times daily as needed. for pain 09/24/19   [provider]  VESICARE 10 MG tablet Take 10 mg by mouth daily.  01/17/18   [provider]    Allergies    Metoprolol  Review of Systems   Review of Systems  Constitutional: Positive for fatigue and unexpected weight change. Negative for chills and fever.  HENT: Negative for congestion and rhinorrhea.   Respiratory: Negative for cough and shortness of breath.   Cardiovascular: Negative for chest pain and palpitations.  Gastrointestinal: Negative for diarrhea, nausea and vomiting.  Genitourinary: Negative for difficulty urinating and dysuria.  Musculoskeletal: Positive for arthralgias and myalgias. Negative for back pain.  Skin: Negative for rash and wound.  Neurological: Negative for light-headedness and headaches.    Physical Exam Updated Vital Signs BP 133/64 (BP Location: Left Arm)   Pulse (!) 58   Temp 97.7  F (36.5 C) (Oral)   Resp 16   Ht 5' 4.5" (1.638 m)   Wt 63.5 kg   SpO2 99%   BMI 23.66 kg/m   Physical Exam Vitals and nursing note reviewed. Exam conducted with a chaperone present.  Constitutional:      General: She is not in acute distress.    Appearance: Normal appearance.  HENT:     Head: Normocephalic and atraumatic.     Right Ear: Tympanic membrane normal. No drainage or swelling.     Left Ear: Tympanic membrane normal. No drainage or swelling.     Nose: No rhinorrhea.     Mouth/Throat:     Mouth: Mucous membranes are moist. No oral lesions.     Pharynx: No pharyngeal swelling, oropharyngeal exudate, posterior oropharyngeal erythema or uvula swelling.  Eyes:     General:        Right eye: No discharge.        Left eye: No discharge.     Conjunctiva/sclera: Conjunctivae normal.  Cardiovascular:     Rate and Rhythm: Normal rate and regular rhythm.  Pulmonary:     Effort: Pulmonary effort is normal. No respiratory distress.     Breath sounds: No stridor. No wheezing, rhonchi or rales.  Abdominal:     General: Abdomen is flat. There is no distension.     Palpations: Abdomen is soft.     Tenderness: There is no abdominal tenderness. There is no rebound.  Musculoskeletal:        General: No tenderness or signs of injury.  Skin:    General: Skin is warm and dry.  Neurological:     General: No focal deficit present.     Mental Status: She is alert. Mental status is at baseline.     Motor: No weakness.     Comments: Equal strength in upper and lower extremities normal ambulation no facial droop no slurred speech.  Alert and oriented x3.  Psychiatric:        Mood and Affect: Mood normal.        Behavior: Behavior normal.     ED Results / Procedures / Treatments   Labs (all labs ordered are listed, but only abnormal results are displayed) Labs Reviewed  BASIC METABOLIC PANEL - Abnormal; Notable for the following components:      Result Value   Sodium 132 (*)     Glucose, Bld 407 (*)    GFR, Estimated 60 (*)    All other components within normal limits  GROUP A STREP BY PCR  SARS CORONAVIRUS 2 (TAT 6-24 HRS)  CBC WITH DIFFERENTIAL/PLATELET  CK    EKG None  Radiology No results found.  Procedures Procedures   Medications Ordered in ED Medications - No data to display  ED Course  I have reviewed the triage vital signs and the nursing notes.  Pertinent labs & imaging results that were available during my care of the patient were reviewed by me and considered in my medical decision making (see chart for details).    MDM Rules/Calculators/A&P                          80 year old female has vague symptoms of body aches fatigue poor appetite sore throat.  No sick contacts overall well-appearing with normal vital signs.  Has difficulty accounting for the timeframe of her illness stating she never had this before, but then she comments on having had multiple episodes similar to this in the past.  Vaccinated for Covid.  Will offer Covid testing will screen with CBC chemistry CK looking for myositis or electrolyte kidney derangement secondary to poor appetite.  Will also check for anemia and strep.  Otherwise patient is well-appearing and safe for outpatient follow-up unless significant findings arise.  Patient's laboratory studies are fairly unremarkable other than hyperglycemia.  Covid test pending at time of discharge.  Patient overall well-appearing, tolerated p.o. multiple times here, she says she is noncompliant with checking her glucose at home.  However she does take her medications and believes she has everything she needs.  She is told to follow-up with her primary care provider and manage her hyperglycemia as the recommendations dictate.  Strict return precautions discussed.  Final Clinical Impression(s) / ED Diagnoses Final diagnoses:  Throat burning  Other fatigue  Hyperglycemia    Rx / DC Orders ED Discharge Orders    None        Sabino Donovan, MD 01/12/21 2251

## 2021-01-12 NOTE — Discharge Instructions (Addendum)
The Covid test is pending at time of discharge.  Instructions on how to follow this up on my chart are on your discharge paperwork, you can also call the department if you are having trouble finding these results.  If he/she is Covid positive he/she will need to be quarantine for total 5 days since the onset of symptoms +24 hours of no fever and resolving symptoms, additionally he/she needs to wear a mask near all others for 5 more days. If he/she is not Covid positive he/she is able to go back to normal day-to-day routine as long as he/she is not having fevers and it has been 24 hours since his/her last fever.  Follow-up with your doctors to review your medications and make sure you have everything you are prescribed.

## 2021-01-12 NOTE — ED Triage Notes (Addendum)
Patient c/o fatigue and a sore throat x 2 weeks.

## 2021-01-13 LAB — SARS CORONAVIRUS 2 (TAT 6-24 HRS): SARS Coronavirus 2: NEGATIVE

## 2021-01-17 ENCOUNTER — Ambulatory Visit: Payer: Medicare Other | Admitting: Podiatry

## 2021-02-02 ENCOUNTER — Ambulatory Visit: Payer: Medicare (Managed Care) | Admitting: Podiatry

## 2021-03-15 ENCOUNTER — Encounter (HOSPITAL_COMMUNITY): Payer: Self-pay | Admitting: Emergency Medicine

## 2021-03-15 ENCOUNTER — Emergency Department (HOSPITAL_COMMUNITY): Payer: Medicare (Managed Care)

## 2021-03-15 ENCOUNTER — Emergency Department (HOSPITAL_COMMUNITY)
Admission: EM | Admit: 2021-03-15 | Discharge: 2021-03-15 | Disposition: A | Discharge status: Home / Self Care | Payer: Medicare (Managed Care) | Attending: Emergency Medicine | Admitting: Emergency Medicine

## 2021-03-15 DIAGNOSIS — R531 Weakness: Secondary | ICD-10-CM | POA: Insufficient documentation

## 2021-03-15 DIAGNOSIS — F0390 Unspecified dementia without behavioral disturbance: Secondary | ICD-10-CM | POA: Insufficient documentation

## 2021-03-15 DIAGNOSIS — I10 Essential (primary) hypertension: Secondary | ICD-10-CM | POA: Insufficient documentation

## 2021-03-15 DIAGNOSIS — Z7984 Long term (current) use of oral hypoglycemic drugs: Secondary | ICD-10-CM | POA: Insufficient documentation

## 2021-03-15 DIAGNOSIS — E119 Type 2 diabetes mellitus without complications: Secondary | ICD-10-CM | POA: Insufficient documentation

## 2021-03-15 DIAGNOSIS — R519 Headache, unspecified: Secondary | ICD-10-CM | POA: Diagnosis present

## 2021-03-15 DIAGNOSIS — Z79899 Other long term (current) drug therapy: Secondary | ICD-10-CM | POA: Insufficient documentation

## 2021-03-15 DIAGNOSIS — Z7901 Long term (current) use of anticoagulants: Secondary | ICD-10-CM | POA: Insufficient documentation

## 2021-03-15 LAB — COMPREHENSIVE METABOLIC PANEL
ALT: 17 U/L (ref 0–44)
AST: 17 U/L (ref 15–41)
Albumin: 3.9 g/dL (ref 3.5–5.0)
Alkaline Phosphatase: 103 U/L (ref 38–126)
Anion gap: 8 (ref 5–15)
BUN: 7 mg/dL — ABNORMAL LOW (ref 8–23)
CO2: 26 mmol/L (ref 22–32)
Calcium: 9.3 mg/dL (ref 8.9–10.3)
Chloride: 106 mmol/L (ref 98–111)
Creatinine, Ser: 0.94 mg/dL (ref 0.44–1.00)
GFR, Estimated: >60 mL/min (ref >60)
Glucose, Bld: 292 mg/dL — ABNORMAL HIGH (ref 70–99)
Potassium: 3.7 mmol/L (ref 3.5–5.1)
Sodium: 140 mmol/L (ref 135–145)
Total Bilirubin: 0.8 mg/dL (ref 0.3–1.2)
Total Protein: 7.1 g/dL (ref 6.5–8.1)

## 2021-03-15 LAB — CBC WITH DIFFERENTIAL/PLATELET
Abs Immature Granulocytes: 0.02 10*3/uL (ref 0.00–0.07)
Basophils Absolute: 0 10*3/uL (ref 0.0–0.1)
Basophils Relative: 0 %
Eosinophils Absolute: 0 10*3/uL (ref 0.0–0.5)
Eosinophils Relative: 0 %
HCT: 39.8 % (ref 36.0–46.0)
Hemoglobin: 13.2 g/dL (ref 12.0–15.0)
Immature Granulocytes: 0 %
Lymphocytes Relative: 30 %
Lymphs Abs: 1.9 10*3/uL (ref 0.7–4.0)
MCH: 32.3 pg (ref 26.0–34.0)
MCHC: 33.2 g/dL (ref 30.0–36.0)
MCV: 97.3 fL (ref 80.0–100.0)
Monocytes Absolute: 0.7 10*3/uL (ref 0.1–1.0)
Monocytes Relative: 10 %
Neutro Abs: 3.8 10*3/uL (ref 1.7–7.7)
Neutrophils Relative %: 60 %
Platelets: 296 10*3/uL (ref 150–400)
RBC: 4.09 MIL/uL (ref 3.87–5.11)
RDW: 12.1 % (ref 11.5–15.5)
WBC: 6.5 10*3/uL (ref 4.0–10.5)
nRBC: 0 % (ref 0.0–0.2)

## 2021-03-15 MED ORDER — SODIUM CHLORIDE 0.9 % IV BOLUS
500.0000 mL | Freq: Once | INTRAVENOUS | Status: AC
  Administered 2021-03-15: 500 mL via INTRAVENOUS

## 2021-03-15 MED ORDER — PROCHLORPERAZINE EDISYLATE 10 MG/2ML IJ SOLN
5.0000 mg | Freq: Once | INTRAMUSCULAR | Status: AC
  Administered 2021-03-15: 5 mg via INTRAVENOUS
  Filled 2021-03-15: qty 2

## 2021-03-15 MED ORDER — DEXAMETHASONE SODIUM PHOSPHATE 4 MG/ML IJ SOLN
4.0000 mg | Freq: Once | INTRAMUSCULAR | Status: AC
  Administered 2021-03-15: 4 mg via INTRAVENOUS
  Filled 2021-03-15: qty 1

## 2021-03-15 NOTE — Discharge Instructions (Addendum)
As discussed, your evaluation today has been largely reassuring.  But, it is important that you monitor your condition carefully, and do not hesitate to return to the ED if you develop new, or concerning changes in your condition. ? ?Otherwise, please follow-up with your physician for appropriate ongoing care. ? ?

## 2021-03-15 NOTE — ED Notes (Signed)
Pt states that all of her symptoms resolved and she is feeling better.

## 2021-03-15 NOTE — ED Triage Notes (Signed)
Per pt, states "her brain is tingling" and having "something like a headache"-symptoms for 3 weeks

## 2021-03-15 NOTE — ED Provider Notes (Signed)
[facility] DEPT Provider Note   CSN: [id number] Arrival date & time: 03/15/21  1616     History Chief Complaint  Patient presents with  . Headache    Jacqueline Ball is a 80 y.o. female.  HPI Patient presents with headache.  She notes a pressure-like tight sensation throughout her entire head.  No clear precipitant, but since onset about 3 weeks ago she has had persistent pain.  No new vision changes, there is some occasional weakness, though inconsistent, and seemingly not in any extremity, more global. No speech changes, no nausea, vomiting, falling, syncope. It is unclear if she is taking any medication for her headache.  She has not seen her physician during this 3-week illness.    Past Medical History:  Diagnosis Date  . Anxiety   . Atrial fibrillation ([place])   . Diabetes mellitus   . Hyperlipidemia   . Hypertension   . Memory loss   . Mood disorder ([place])   . Physical exam, annual 10/22/06  . Renal cyst     Patient Active Problem List   Diagnosis Date Noted  . Acute midline low back pain without sciatica 11/29/2019  . GERD (gastroesophageal reflux disease) 05/12/2019  . Arthritis 05/12/2019  . Dementia ([place]) 05/12/2019  . Hyperlipidemia 05/12/2019  . Overactive bladder 05/12/2019  . Chronic anticoagulation 03/18/2018  . Late onset Alzheimer's disease without behavioral disturbance ([place]) 01/21/2017  . Chronic atrial fibrillation ([place]) 11/02/2015  . Essential hypertension 11/02/2015  . Type 2 diabetes mellitus without complication, without long-term current use of insulin ([place]) 11/02/2015  . Cognitive impairment 10/31/2015  . Chest pain 11/22/2014  . Muscle cramps 04/17/2012  . Bilateral shoulder pain 03/09/2012  . Essential hypertension 03/28/2011  . Diabetes mellitus type 2, noninsulin dependent ([place]) 03/28/2011  . 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 ABDOMINAL  HYSTERECTOMY       OB History   No obstetric history on file.     Family History  Problem Relation Age of Onset  . Diabetes Sister     Social History   Tobacco Use  . Smoking status: Never Smoker  . Smokeless tobacco: Never Used  Vaping Use  . Vaping Use: Never used  Substance Use Topics  . Alcohol use: No  . Drug use: No    Home Medications Prior to Admission medications   Medication Sig Start Date End Date Taking? Authorizing Provider  ACCU-CHEK GUIDE test strip USE AS DIRECTED UP TO THREE TIMES DAILY 01/16/21   [provider]  acetaminophen (TYLENOL) 500 MG tablet Take 500 mg by mouth every 6 (six) hours as needed for pain.    [provider]  amoxicillin-clavulanate (AUGMENTIN) 500-125 MG tablet Take 1 tablet by mouth 2 (two) times daily. 12/20/20   [provider]  apixaban (ELIQUIS) 5 MG TABS tablet Take 1 tablet (5 mg total) by mouth 2 (two) times daily. 11/24/14   Charolette Forward, MD  atenolol (TENORMIN) 50 MG tablet Take 50 mg by mouth daily. 07/29/19   [provider]  atorvastatin (LIPITOR) 40 MG tablet Take 40 mg by mouth at bedtime.    [provider]  azelastine (ASTELIN) 0.1 % nasal spray Place 1 spray into both nostrils as needed for rhinitis or allergies.  05/24/18   [provider]  Blood Glucose Monitoring Suppl (ACCU-CHEK GUIDE) w/Device KIT USE AS DIRECTED UP TO THREE TIMES DAILY 01/16/21   [provider]  cyclobenzaprine (FLEXERIL) 5 MG tablet Take 1-2 tablets (5-10 mg total) by mouth at bedtime. 06/06/20   Wieters, Hallie C, PA-C  dicyclomine (BENTYL) 20 MG tablet Take 20 mg by mouth 3 (three) times daily as needed for spasms. 08/29/18   [provider]  donepezil (ARICEPT) 10 MG tablet Take 1 tablet (10 mg total) by mouth at bedtime. 06/06/17   Rosalin Hawking, MD  esomeprazole (NEXIUM) 40 MG capsule Take 40 mg by mouth daily at 12 noon.    [provider]  estradiol (CLIMARA) 0.06  MG/24HR Place 0.06 patches onto the skin once a week. 08/12/17   [provider]  fluconazole (DIFLUCAN) 150 MG tablet fluconazole 150 mg tablet    [provider]  fluticasone (FLONASE) 50 MCG/ACT nasal spray Place 1 spray into both nostrils daily. 09/15/18   Bast, Tressia Miners A, NP  glimepiride (AMARYL) 4 MG tablet Take 4 mg by mouth daily with breakfast.    [provider]  HYDROcodone-acetaminophen (NORCO/VICODIN) 5-325 MG tablet Take 2 tablets by mouth every 4 (four) hours as needed. 11/29/19   Wardell Honour, MD  losartan (COZAAR) 25 MG tablet Take 25 mg by mouth daily. 05/03/20   [provider]  meloxicam (MOBIC) 7.5 MG tablet Take 1 tablet (7.5 mg total) by mouth daily. 07/16/20   Vanessa Kick, MD  metFORMIN (GLUCOPHAGE) 500 MG tablet Take 500 mg by mouth 2 (two) times daily with a meal.  08/23/15   [provider]  MYRBETRIQ 50 MG TB24 tablet Take 50 mg by mouth daily. 07/29/19   [provider]  nystatin cream (MYCOSTATIN) Apply to affected area 2 times daily 04/02/20   Loura Halt A, NP  omeprazole (PRILOSEC) 40 MG capsule Take 40 mg by mouth daily. 11/27/19   [provider]  pantoprazole (PROTONIX) 20 MG tablet Take 1 tablet (20 mg total) by mouth daily. 02/24/20   Marney Setting, NP  rosuvastatin (CRESTOR) 10 MG tablet Take 10 mg by mouth at bedtime. 09/14/19   [provider]  sulfamethoxazole-trimethoprim (BACTRIM DS) 800-160 MG tablet Take 1 tablet by mouth 2 (two) times daily. 03/12/20   Jaynee Eagles, PA-C  traMADol (ULTRAM) 50 MG tablet Take 50 mg by mouth 2 (two) times daily as needed. for pain 09/24/19   [provider]  VESICARE 10 MG tablet Take 10 mg by mouth daily.  01/17/18   [provider]    Allergies    Metoprolol  Review of Systems   Review of Systems  Constitutional:       Per HPI, otherwise negative  HENT:       Per HPI, otherwise negative  Eyes: Negative for visual disturbance.   Respiratory:       Per HPI, otherwise negative  Cardiovascular:       Per HPI, otherwise negative  Gastrointestinal: Negative for vomiting.  Endocrine:       Negative aside from HPI  Genitourinary:       Neg aside from HPI   Musculoskeletal:       Per HPI, otherwise negative  Skin: Negative.   Neurological: Positive for weakness and headaches. Negative for syncope.    Physical Exam Updated Vital Signs BP (!) 148/80   Pulse 74   Temp 98.8 F (37.1 C) (Oral)   Resp 18   SpO2 100%   Physical Exam Vitals and nursing note reviewed.  Constitutional:      General: She is not in acute distress.  Appearance: She is well-developed.  HENT:     Head: Normocephalic and atraumatic.  Eyes:     Conjunctiva/sclera: Conjunctivae normal.  Cardiovascular:     Rate and Rhythm: Normal rate and regular rhythm.  Pulmonary:     Effort: Pulmonary effort is normal. No respiratory distress.     Breath sounds: Normal breath sounds. No stridor.  Abdominal:     General: There is no distension.  Skin:    General: Skin is warm and dry.  Neurological:     Mental Status: She is alert and oriented to person, place, and time.     Cranial Nerves: No cranial nerve deficit, dysarthria or facial asymmetry.     Motor: No weakness.     Coordination: Coordination normal.  Psychiatric:        Mood and Affect: Mood normal.     ED Results / Procedures / Treatments   Labs (all labs ordered are listed, but only abnormal results are displayed) Labs Reviewed  COMPREHENSIVE METABOLIC PANEL - Abnormal; Notable for the following components:      Result Value   Glucose, Bld 292 (*)    BUN 7 (*)    All other components within normal limits  CBC WITH DIFFERENTIAL/PLATELET  URINALYSIS, ROUTINE W REFLEX MICROSCOPIC    EKG None  Radiology CT Head Wo Contrast  Result Date: 03/15/2021 CLINICAL DATA:  Mental status change, unknown cause new HA w occasional weakness EXAM: CT HEAD WITHOUT CONTRAST  TECHNIQUE: Contiguous axial images were obtained from the base of the skull through the vertex without intravenous contrast. COMPARISON:  Most recent head CT 01/20/2018 FINDINGS: Brain: Stable the atrophy and chronic small vessel ischemia. No intracranial hemorrhage, mass effect, or midline shift. No hydrocephalus. The basilar cisterns are patent. No evidence of territorial infarct or acute ischemia. No extra-axial or intracranial fluid collection. Vascular: Atherosclerosis of skullbase vasculature without hyperdense vessel or abnormal calcification. Skull: Normal. Negative for fracture or focal lesion. Sinuses/Orbits: Paranasal sinuses and mastoid air cells are clear. The visualized orbits are unremarkable. Other: None. IMPRESSION: 1. No acute intracranial abnormality. 2. Stable atrophy and chronic small vessel ischemia from 2019. Electronically Signed   By: Keith Rake M.D.   On: 03/15/2021 18:58    Procedures Procedures   Medications Ordered in ED Medications  sodium chloride 0.9 % bolus 500 mL (500 mLs Intravenous New Bag/Given 03/15/21 1833)  prochlorperazine (COMPAZINE) injection 5 mg (5 mg Intravenous Given 03/15/21 1827)  dexamethasone (DECADRON) injection 4 mg (4 mg Intravenous Given 03/15/21 1832)    ED Course  I have reviewed the triage vital signs and the nursing notes.  Pertinent labs & imaging results that were available during my care of the patient were reviewed by me and considered in my medical decision making (see chart for details).  8:08 PM Patient feeling markedly better.  Head CT reviewed, no notable findings. Labs unremarkable aside from mild hyperglycemia. No evidence for anion gap, and the patient has no nausea, vomiting. Low suspicion for intracranial hemorrhage, no evidence for CNS abnormalities.  No evidence for encephalopathy/meningitis. Given her improvement here, patient appropriate for discharge with outpatient follow-up.  Final Clinical Impression(s) / ED  Diagnoses Final diagnoses:  Bad headache   MDM Number of Diagnoses or Management Options Bad headache: new, needed workup   Amount and/or Complexity of Data Reviewed Clinical lab tests: reviewed and ordered Tests in the radiology section of CPT: reviewed and ordered Tests in the medicine section of CPT: reviewed and ordered Decide  to obtain previous medical records or to obtain history from someone other than the patient: yes Review and summarize past medical records: yes Independent visualization of images, tracings, or specimens: yes  Risk of Complications, Morbidity, and/or Mortality Presenting problems: high Diagnostic procedures: high Management options: high  Critical Care Total time providing critical care: < 30 minutes  Patient Progress Patient progress: stable    Carmin Muskrat, MD 03/15/21 2011

## 2021-07-01 ENCOUNTER — Emergency Department (HOSPITAL_COMMUNITY)
Admission: EM | Admit: 2021-07-01 | Discharge: 2021-07-01 | Disposition: A | Discharge status: Home / Self Care | Payer: Medicare HMO | Attending: Emergency Medicine | Admitting: Emergency Medicine

## 2021-07-01 ENCOUNTER — Other Ambulatory Visit: Payer: Self-pay

## 2021-07-01 ENCOUNTER — Encounter (HOSPITAL_COMMUNITY): Payer: Self-pay | Admitting: Emergency Medicine

## 2021-07-01 DIAGNOSIS — Z79899 Other long term (current) drug therapy: Secondary | ICD-10-CM | POA: Insufficient documentation

## 2021-07-01 DIAGNOSIS — R252 Cramp and spasm: Secondary | ICD-10-CM | POA: Diagnosis present

## 2021-07-01 DIAGNOSIS — Z7901 Long term (current) use of anticoagulants: Secondary | ICD-10-CM | POA: Insufficient documentation

## 2021-07-01 DIAGNOSIS — Z8679 Personal history of other diseases of the circulatory system: Secondary | ICD-10-CM | POA: Insufficient documentation

## 2021-07-01 DIAGNOSIS — F0390 Unspecified dementia without behavioral disturbance: Secondary | ICD-10-CM | POA: Insufficient documentation

## 2021-07-01 DIAGNOSIS — E1169 Type 2 diabetes mellitus with other specified complication: Secondary | ICD-10-CM | POA: Diagnosis not present

## 2021-07-01 DIAGNOSIS — E785 Hyperlipidemia, unspecified: Secondary | ICD-10-CM | POA: Insufficient documentation

## 2021-07-01 LAB — CBC WITH DIFFERENTIAL/PLATELET
Abs Immature Granulocytes: 0.01 10*3/uL (ref 0.00–0.07)
Basophils Absolute: 0 10*3/uL (ref 0.0–0.1)
Basophils Relative: 1 %
Eosinophils Absolute: 0 10*3/uL (ref 0.0–0.5)
Eosinophils Relative: 0 %
HCT: 39 % (ref 36.0–46.0)
Hemoglobin: 12.9 g/dL (ref 12.0–15.0)
Immature Granulocytes: 0 %
Lymphocytes Relative: 28 %
Lymphs Abs: 1.7 10*3/uL (ref 0.7–4.0)
MCH: 31.7 pg (ref 26.0–34.0)
MCHC: 33.1 g/dL (ref 30.0–36.0)
MCV: 95.8 fL (ref 80.0–100.0)
Monocytes Absolute: 0.5 10*3/uL (ref 0.1–1.0)
Monocytes Relative: 8 %
Neutro Abs: 3.8 10*3/uL (ref 1.7–7.7)
Neutrophils Relative %: 63 %
Platelets: 354 10*3/uL (ref 150–400)
RBC: 4.07 MIL/uL (ref 3.87–5.11)
RDW: 12 % (ref 11.5–15.5)
WBC: 6.1 10*3/uL (ref 4.0–10.5)
nRBC: 0 % (ref 0.0–0.2)

## 2021-07-01 LAB — COMPREHENSIVE METABOLIC PANEL
ALT: 16 U/L (ref 0–44)
AST: 18 U/L (ref 15–41)
Albumin: 3.6 g/dL (ref 3.5–5.0)
Alkaline Phosphatase: 96 U/L (ref 38–126)
Anion gap: 8 (ref 5–15)
BUN: 8 mg/dL (ref 8–23)
CO2: 24 mmol/L (ref 22–32)
Calcium: 9.2 mg/dL (ref 8.9–10.3)
Chloride: 102 mmol/L (ref 98–111)
Creatinine, Ser: 0.8 mg/dL (ref 0.44–1.00)
GFR, Estimated: >60 mL/min (ref >60)
Glucose, Bld: 339 mg/dL — ABNORMAL HIGH (ref 70–99)
Potassium: 3.9 mmol/L (ref 3.5–5.1)
Sodium: 134 mmol/L — ABNORMAL LOW (ref 135–145)
Total Bilirubin: 0.6 mg/dL (ref 0.3–1.2)
Total Protein: 6.7 g/dL (ref 6.5–8.1)

## 2021-07-01 NOTE — ED Triage Notes (Signed)
Pt reports being treated for a "sinus headache" but "I lost my antibiotic."  She also states she can't sleep, has leg cramps and a yeast infection.

## 2021-07-01 NOTE — ED Provider Notes (Signed)
[facility] EMERGENCY DEPARTMENT Provider Note  CSN: [id number] Arrival date & time: 07/01/21 2007    History Chief Complaint  Patient presents with   Leg Cramps    Jacqueline Ball is a 81 y.o. female with history of dementia, lives with her husband and daughter who is at bedside. Patient asked daughter to leave the room during history. Patient is very vague with her reason for being here. She reports she saw her doctor, Dr. Marlou Sa, recently and was diagnosed with a sinusitis and a yeast infection. She does not know what day that visit was. She states she was given Rx for antibiotics which her husband picked up from the pharmacy but then she lost the pill bottles. She does not know what medications she was prescribed. She also reports she is having cramping pain in her legs. She cannot further elaborate or describe how long that has been ongoing.    Past Medical History:  Diagnosis Date   Anxiety    Atrial fibrillation ([place])    Diabetes mellitus    Hyperlipidemia    Hypertension    Memory loss    Mood disorder [facility])    Physical exam, annual 10/22/06   Renal cyst     Past Surgical History:  Procedure Laterality Date   ABDOMINAL HYSTERECTOMY      Family History  Problem Relation Age of Onset   Diabetes Sister     Social History   Tobacco Use   Smoking status: Never   Smokeless tobacco: Never  Vaping Use   Vaping Use: Never used  Substance Use Topics   Alcohol use: No   Drug use: No     Home Medications Prior to Admission medications   Medication Sig Start Date End Date Taking? Authorizing Provider  ACCU-CHEK GUIDE test strip USE AS DIRECTED UP TO THREE TIMES DAILY 01/16/21   [provider]  acetaminophen (TYLENOL) 500 MG tablet Take 500 mg by mouth every 6 (six) hours as needed for pain.    [provider]  amoxicillin-clavulanate (AUGMENTIN) 500-125 MG tablet Take 1 tablet by mouth 2 (two) times daily. 12/20/20   [provider]  apixaban  (ELIQUIS) 5 MG TABS tablet Take 1 tablet (5 mg total) by mouth 2 (two) times daily. 11/24/14   Charolette Forward, MD  atenolol (TENORMIN) 50 MG tablet Take 50 mg by mouth daily. 07/29/19   [provider]  atorvastatin (LIPITOR) 40 MG tablet Take 40 mg by mouth at bedtime.    [provider]  azelastine (ASTELIN) 0.1 % nasal spray Place 1 spray into both nostrils as needed for rhinitis or allergies.  05/24/18   [provider]  Blood Glucose Monitoring Suppl (ACCU-CHEK GUIDE) w/Device KIT USE AS DIRECTED UP TO THREE TIMES DAILY 01/16/21   [provider]  cyclobenzaprine (FLEXERIL) 5 MG tablet Take 1-2 tablets (5-10 mg total) by mouth at bedtime. 06/06/20   Wieters, Hallie C, PA-C  dicyclomine (BENTYL) 20 MG tablet Take 20 mg by mouth 3 (three) times daily as needed for spasms. 08/29/18   [provider]  donepezil (ARICEPT) 10 MG tablet Take 1 tablet (10 mg total) by mouth at bedtime. 06/06/17   Rosalin Hawking, MD  esomeprazole (NEXIUM) 40 MG capsule Take 40 mg by mouth daily at 12 noon.    [provider]  estradiol (CLIMARA) 0.06 MG/24HR Place 0.06 patches onto the skin once a week. 08/12/17   [provider]  fluconazole (DIFLUCAN) 150 MG tablet fluconazole  150 mg tablet    [provider]  fluticasone (FLONASE) 50 MCG/ACT nasal spray Place 1 spray into both nostrils daily. 09/15/18   Bast, Tressia Miners A, NP  glimepiride (AMARYL) 4 MG tablet Take 4 mg by mouth daily with breakfast.    [provider]  HYDROcodone-acetaminophen (NORCO/VICODIN) 5-325 MG tablet Take 2 tablets by mouth every 4 (four) hours as needed. 11/29/19   Wardell Honour, MD  losartan (COZAAR) 25 MG tablet Take 25 mg by mouth daily. 05/03/20   [provider]  meloxicam (MOBIC) 7.5 MG tablet Take 1 tablet (7.5 mg total) by mouth daily. 07/16/20   Vanessa Kick, MD  metFORMIN (GLUCOPHAGE) 500 MG tablet Take 500 mg by mouth 2 (two) times daily with a meal.   08/23/15   [provider]  MYRBETRIQ 50 MG TB24 tablet Take 50 mg by mouth daily. 07/29/19   [provider]  nystatin cream (MYCOSTATIN) Apply to affected area 2 times daily 04/02/20   Loura Halt A, NP  omeprazole (PRILOSEC) 40 MG capsule Take 40 mg by mouth daily. 11/27/19   [provider]  pantoprazole (PROTONIX) 20 MG tablet Take 1 tablet (20 mg total) by mouth daily. 02/24/20   Marney Setting, NP  rosuvastatin (CRESTOR) 10 MG tablet Take 10 mg by mouth at bedtime. 09/14/19   [provider]  sulfamethoxazole-trimethoprim (BACTRIM DS) 800-160 MG tablet Take 1 tablet by mouth 2 (two) times daily. 03/12/20   Jaynee Eagles, PA-C  traMADol (ULTRAM) 50 MG tablet Take 50 mg by mouth 2 (two) times daily as needed. for pain 09/24/19   [provider]  VESICARE 10 MG tablet Take 10 mg by mouth daily.  01/17/18   [provider]     Allergies    Metoprolol   Review of Systems   Review of Systems A comprehensive review of systems was completed and negative except as noted in HPI.    Physical Exam BP (!) 143/83   Pulse 73   Temp 98 F (36.7 C) (Oral)   Resp 16   SpO2 97%   Physical Exam Vitals and nursing note reviewed.  Constitutional:      Appearance: Normal appearance.  HENT:     Head: Normocephalic and atraumatic.     Comments: No tenderness to percussion of sinuses    Nose: Nose normal. No congestion or rhinorrhea.     Mouth/Throat:     Mouth: Mucous membranes are moist.  Eyes:     Extraocular Movements: Extraocular movements intact.     Conjunctiva/sclera: Conjunctivae normal.  Cardiovascular:     Rate and Rhythm: Normal rate.  Pulmonary:     Effort: Pulmonary effort is normal.     Breath sounds: Normal breath sounds.  Abdominal:     General: Abdomen is flat.     Palpations: Abdomen is soft.     Tenderness: There is no abdominal tenderness.  Musculoskeletal:        General: No swelling, tenderness or signs of injury.  Normal range of motion.     Cervical back: Neck supple.     Right lower leg: No edema.     Left lower leg: No edema.  Skin:    General: Skin is warm and dry.  Neurological:     General: No focal deficit present.     Mental Status: She is alert. Mental status is at baseline.     Cranial Nerves: No cranial nerve deficit.     Sensory:  No sensory deficit.     Motor: No weakness.  Psychiatric:        Mood and Affect: Mood normal.     ED Results / Procedures / Treatments   Labs (all labs ordered are listed, but only abnormal results are displayed) Labs Reviewed  COMPREHENSIVE METABOLIC PANEL - Abnormal; Notable for the following components:      Result Value   Sodium 134 (*)    Glucose, Bld 339 (*)    All other components within normal limits  CBC WITH DIFFERENTIAL/PLATELET    EKG None  Radiology No results found.  Procedures Procedures  Medications Ordered in the ED Medications - No data to display   MDM Rules/Calculators/A&P MDM Patient does not have signs of sinusitis on exam, she cannot elaborate on her symptoms. She was advised to call her doctor's office to discuss a new Rx for the Abx. I offered to evaluate her leg cramps as this seems to be a more acute complaint.   ED Course  I have reviewed the triage vital signs and the nursing notes.  Pertinent labs & imaging results that were available during my care of the patient were reviewed by me and considered in my medical decision making (see chart for details).  Clinical Course as of 07/01/21 2240  Sat Jul 01, 2021  2127 CBC is normal.  [CS]  2151 CMP with mild hyperglycemia. Advised to take her medications as prescribed.  [CS]  2240 Discussed results with patient. Recommend she call her PCP on Monday to sort out her prescriptions.  [CS]    Clinical Course User Index [CS] Truddie Hidden, MD    Final Clinical Impression(s) / ED Diagnoses Final diagnoses:  Muscle cramps    Rx / DC Orders ED  Discharge Orders     None        Truddie Hidden, MD 07/01/21 2241

## 2021-07-07 ENCOUNTER — Emergency Department (HOSPITAL_COMMUNITY)
Admission: EM | Admit: 2021-07-07 | Discharge: 2021-07-08 | Disposition: A | Discharge status: Home / Self Care | Payer: Medicare HMO | Attending: Emergency Medicine | Admitting: Emergency Medicine

## 2021-07-07 ENCOUNTER — Encounter (HOSPITAL_COMMUNITY): Payer: Self-pay | Admitting: Emergency Medicine

## 2021-07-07 ENCOUNTER — Emergency Department (HOSPITAL_COMMUNITY): Payer: Medicare HMO

## 2021-07-07 ENCOUNTER — Other Ambulatory Visit: Payer: Self-pay

## 2021-07-07 DIAGNOSIS — E119 Type 2 diabetes mellitus without complications: Secondary | ICD-10-CM | POA: Diagnosis not present

## 2021-07-07 DIAGNOSIS — M25521 Pain in right elbow: Secondary | ICD-10-CM | POA: Diagnosis present

## 2021-07-07 DIAGNOSIS — G301 Alzheimer's disease with late onset: Secondary | ICD-10-CM | POA: Diagnosis not present

## 2021-07-07 DIAGNOSIS — M25531 Pain in right wrist: Secondary | ICD-10-CM | POA: Insufficient documentation

## 2021-07-07 DIAGNOSIS — I10 Essential (primary) hypertension: Secondary | ICD-10-CM | POA: Insufficient documentation

## 2021-07-07 NOTE — ED Notes (Signed)
X-ray at bedside

## 2021-07-07 NOTE — ED Provider Notes (Addendum)
Emergency Medicine Provider Triage Evaluation Note  Jacqueline Ball , a 80 y.o. female  was evaluated in triage.  Pt complains of R arm pain that began four days ago. Denies any trauma that she is aware of.   Review of Systems  Positive: Arm pain  Negative: fevers  Physical Exam  BP 133/89   Pulse (!) 56   Temp 98.4 F (36.9 C) (Oral)   Resp 16   Ht 5\' 5"  (1.651 m)   Wt 63.5 kg   SpO2 99%   BMI 23.30 kg/m  Gen:   Awake, no distress   Resp:  Normal effort  MSK:   Moves extremities without difficulty, R arm TTP, no erythema, some mild edema noted. Radial pulse 2+   Medical Decision Making  Medically screening exam initiated at 10:30 PM.  Appropriate orders placed.  Kazandra Forstrom was informed that the remainder of the evaluation will be completed by another provider, this initial triage assessment does not replace that evaluation, and the importance of remaining in the ED until their evaluation is complete.       [name], PA-C 07/07/21 2231    07/09/21, MD 07/08/21 0005

## 2021-07-07 NOTE — ED Triage Notes (Addendum)
Pt c/o right arm pain that radiates to hand x 3 -4 days. Denies injury and fevers. Pt reports self-medicated with 6 tylenol "the other day" with no relief.

## 2021-07-08 MED ORDER — DICLOFENAC SODIUM 1 % EX GEL: 2.0000 g | Freq: Four times a day (QID) | CUTANEOUS | 0 refills | Status: DC

## 2021-07-08 MED ORDER — TRAMADOL HCL 50 MG PO TABS
50.0000 mg | ORAL_TABLET | Freq: Once | ORAL | Status: AC
  Administered 2021-07-08: 50 mg via ORAL
  Filled 2021-07-08: qty 1

## 2021-07-08 NOTE — ED Provider Notes (Signed)
Emergency Department Provider Note   I have reviewed the triage vital signs and the nursing notes.   HISTORY  Chief Complaint Arm Pain   HPI Jacqueline Ball is a 80 y.o. female with past medical history reviewed below presents to the emergency department with right lateral elbow pain.  Symptoms have been present for the past 24 hours.  She does not recall an injury.  She is not had joint redness or swelling.  She feels worsening pain with touching over the area or moving the elbow.  She does note some radiation of pain to the wrist on the right.  No shoulder or neck pain.  No numbness or tingling.  No chest pain or shortness of breath. No history on pain in this joint.   Past Medical History:  Diagnosis Date   Anxiety    Atrial fibrillation (HCC)    Diabetes mellitus    Hyperlipidemia    Hypertension    Memory loss    Mood disorder [facility])    Physical exam, annual 10/22/06   Renal cyst     Patient Active Problem List   Diagnosis Date Noted   Acute midline low back pain without sciatica 11/29/2019   GERD (gastroesophageal reflux disease) 05/12/2019   Arthritis 05/12/2019   Dementia (HCC) 05/12/2019   Hyperlipidemia 05/12/2019   Overactive bladder 05/12/2019   Chronic anticoagulation 03/18/2018   Late onset Alzheimer's disease without behavioral disturbance (HCC) 01/21/2017   Chronic atrial fibrillation (HCC) 11/02/2015   Essential hypertension 11/02/2015   Type 2 diabetes mellitus without complication, without [place]line Dragon-term current use of insulin (HCC) 11/02/2015   Cognitive impairment 10/31/2015   Chest pain 11/22/2014   Muscle cramps 04/17/2012   Bilateral shoulder pain 03/09/2012   Essential hypertension 03/28/2011   Diabetes mellitus type 2, noninsulin dependent (HCC) 03/28/2011   Atypical anxiety disorder 03/28/2011    Past Surgical History:  Procedure Laterality Date   ABDOMINAL HYSTERECTOMY      Allergies Metoprolol  Family History  Problem Relation Age of  Onset   Diabetes Sister     Social History Social History   Tobacco Use   Smoking status: Never   Smokeless tobacco: Never  Vaping Use   Vaping Use: Never used  Substance Use Topics   Alcohol use: No   Drug use: No    Review of Systems  Constitutional: No fever/chills Eyes: No visual changes. ENT: No sore throat. Cardiovascular: Denies chest pain. Respiratory: Denies shortness of breath. Gastrointestinal: No abdominal pain.  No nausea, no vomiting.  No diarrhea.  No constipation. Genitourinary: Negative for dysuria. Musculoskeletal: Negative for back pain. Positive right arm/elbow pain.  Skin: Negative for rash. Neurological: Negative for headaches, focal weakness or numbness.  10-point ROS otherwise negative.  ____________________________________________   PHYSICAL EXAM:  VITAL SIGNS: ED Triage Vitals  Enc Vitals Group     BP 07/07/21 2217 133/89     Pulse Rate 07/07/21 2217 (!) 56     Resp 07/07/21 2217 16     Temp 07/07/21 2217 98.4 F (36.9 C)     Temp Source 07/07/21 2217 Oral     SpO2 07/07/21 2217 99 %     Weight 07/07/21 2217 140 lb (63.5 kg)     Height 07/07/21 2217 5\' 5"  (1.651 m)   Constitutional: Alert and oriented. Well appearing and in no acute distress. Eyes: Conjunctivae are normal.  Head: Atraumatic. Nose: No congestion/rhinnorhea. Mouth/Throat: Mucous membranes are moist.   Neck: No stridor. No cervical spine  tenderness to palpation. Cardiovascular: Normal rate, regular rhythm. Good peripheral circulation. Grossly normal heart sounds.   Respiratory: Normal respiratory effort.  No retractions. Lungs CTAB. Gastrointestinal: No distention.  Musculoskeletal: Normal range of motion of the right shoulder, elbow, wrist.  There is some focal tenderness over the right lateral elbow but no erythema, cellulitis.  No joint swelling or redness. No warmth to touch. Pain is reproducible.  Neurologic:  Normal speech and language. No gross focal neurologic  deficits are appreciated.  Skin:  Skin is warm, dry and intact. No rash noted.   ____________________________________________  RADIOLOGY  DG Elbow Complete Right  Result Date: 07/07/2021 CLINICAL DATA:  Pain.  Ongoing for 4 days. EXAM: RIGHT ELBOW - COMPLETE 3+ VIEW COMPARISON:  None. FINDINGS: There is no evidence of fracture, dislocation, or joint effusion. There is no evidence of arthropathy or other focal bone abnormality. Soft tissues are unremarkable. IMPRESSION: Negative. Electronically Signed   By: Tish Frederickson M.D.   On: 07/07/2021 22:59    ____________________________________________   PROCEDURES  Procedure(s) performed:   Procedures  None  ____________________________________________   INITIAL IMPRESSION / ASSESSMENT AND PLAN / ED COURSE  Pertinent labs & imaging results that were available during my care of the patient were reviewed by me and considered in my medical decision making (see chart for details).   Patient presents to the emergency department with right elbow pain.  Pain is reproducible to palpation.  Exam is not consistent with septic joint or cellulitis.  Possible radicular pain but no discomfort into the shoulder or neck.  No numbness or weakness.  With reproducible pain I have exceedingly low suspicion for atypical ACS presentation.  Imaging here is unremarkable.  Plan for Voltaren gel, Tylenol, sports medicine follow-up which was provided at the time of discharge.   ____________________________________________  FINAL CLINICAL IMPRESSION(S) / ED DIAGNOSES  Final diagnoses:  Right elbow pain    NEW OUTPATIENT MEDICATIONS STARTED DURING THIS VISIT:  New Prescriptions   DICLOFENAC SODIUM (VOLTAREN) 1 % GEL    Apply 2 g topically 4 (four) times daily.    Note:  This document was prepared using Dragon voice recognition software and may include unintentional dictation errors.  Alona Bene, MD, [facility] Emergency Medicine    Dresden Ament, Arlyss Repress,  MD 07/08/21 [phone]

## 2021-07-08 NOTE — Discharge Instructions (Signed)
Use the Voltaren gel and Tylenol as needed for joint pain. Return to the ED with any fever or joint redness/swelling.

## 2021-08-03 DIAGNOSIS — N390 Urinary tract infection, site not specified: Secondary | ICD-10-CM | POA: Diagnosis not present

## 2021-08-03 DIAGNOSIS — R8290 Unspecified abnormal findings in urine: Secondary | ICD-10-CM | POA: Diagnosis not present

## 2021-08-15 ENCOUNTER — Ambulatory Visit: Payer: Medicare (Managed Care) | Admitting: Podiatry

## 2021-08-17 ENCOUNTER — Ambulatory Visit: Payer: Medicare HMO | Admitting: Podiatry

## 2021-11-12 ENCOUNTER — Other Ambulatory Visit: Payer: Self-pay

## 2021-11-12 ENCOUNTER — Encounter (HOSPITAL_COMMUNITY): Payer: Self-pay

## 2021-11-12 ENCOUNTER — Emergency Department (HOSPITAL_COMMUNITY)
Admission: EM | Admit: 2021-11-12 | Discharge: 2021-11-12 | Disposition: A | Discharge status: Home / Self Care | Payer: Medicare HMO | Attending: Emergency Medicine | Admitting: Emergency Medicine

## 2021-11-12 DIAGNOSIS — Z20822 Contact with and (suspected) exposure to covid-19: Secondary | ICD-10-CM | POA: Diagnosis not present

## 2021-11-12 DIAGNOSIS — E119 Type 2 diabetes mellitus without complications: Secondary | ICD-10-CM | POA: Insufficient documentation

## 2021-11-12 DIAGNOSIS — F0390 Unspecified dementia without behavioral disturbance: Secondary | ICD-10-CM | POA: Diagnosis not present

## 2021-11-12 DIAGNOSIS — Z7984 Long term (current) use of oral hypoglycemic drugs: Secondary | ICD-10-CM | POA: Insufficient documentation

## 2021-11-12 DIAGNOSIS — I10 Essential (primary) hypertension: Secondary | ICD-10-CM | POA: Diagnosis not present

## 2021-11-12 DIAGNOSIS — Z7901 Long term (current) use of anticoagulants: Secondary | ICD-10-CM | POA: Insufficient documentation

## 2021-11-12 DIAGNOSIS — M791 Myalgia, unspecified site: Secondary | ICD-10-CM | POA: Insufficient documentation

## 2021-11-12 DIAGNOSIS — Z79899 Other long term (current) drug therapy: Secondary | ICD-10-CM | POA: Insufficient documentation

## 2021-11-12 DIAGNOSIS — I4891 Unspecified atrial fibrillation: Secondary | ICD-10-CM | POA: Insufficient documentation

## 2021-11-12 DIAGNOSIS — M7918 Myalgia, other site: Secondary | ICD-10-CM

## 2021-11-12 LAB — CBC WITH DIFFERENTIAL/PLATELET
Abs Immature Granulocytes: 0.02 10*3/uL (ref 0.00–0.07)
Basophils Absolute: 0 10*3/uL (ref 0.0–0.1)
Basophils Relative: 0 %
Eosinophils Absolute: 0 10*3/uL (ref 0.0–0.5)
Eosinophils Relative: 0 %
HCT: 41.6 % (ref 36.0–46.0)
Hemoglobin: 13.7 g/dL (ref 12.0–15.0)
Immature Granulocytes: 0 %
Lymphocytes Relative: 22 %
Lymphs Abs: 1.5 10*3/uL (ref 0.7–4.0)
MCH: 32.3 pg (ref 26.0–34.0)
MCHC: 32.9 g/dL (ref 30.0–36.0)
MCV: 98.1 fL (ref 80.0–100.0)
Monocytes Absolute: 0.7 10*3/uL (ref 0.1–1.0)
Monocytes Relative: 10 %
Neutro Abs: 4.7 10*3/uL (ref 1.7–7.7)
Neutrophils Relative %: 68 %
Platelets: 322 10*3/uL (ref 150–400)
RBC: 4.24 MIL/uL (ref 3.87–5.11)
RDW: 12.3 % (ref 11.5–15.5)
WBC: 6.9 10*3/uL (ref 4.0–10.5)
nRBC: 0 % (ref 0.0–0.2)

## 2021-11-12 LAB — COMPREHENSIVE METABOLIC PANEL
ALT: 19 U/L (ref 0–44)
AST: 20 U/L (ref 15–41)
Albumin: 3.9 g/dL (ref 3.5–5.0)
Alkaline Phosphatase: 76 U/L (ref 38–126)
Anion gap: 9 (ref 5–15)
BUN: 11 mg/dL (ref 8–23)
CO2: 24 mmol/L (ref 22–32)
Calcium: 9.3 mg/dL (ref 8.9–10.3)
Chloride: 104 mmol/L (ref 98–111)
Creatinine, Ser: 0.78 mg/dL (ref 0.44–1.00)
GFR, Estimated: >60 mL/min (ref >60)
Glucose, Bld: 131 mg/dL — ABNORMAL HIGH (ref 70–99)
Potassium: 3.7 mmol/L (ref 3.5–5.1)
Sodium: 137 mmol/L (ref 135–145)
Total Bilirubin: 0.8 mg/dL (ref 0.3–1.2)
Total Protein: 6.9 g/dL (ref 6.5–8.1)

## 2021-11-12 LAB — RESP PANEL BY RT-PCR (FLU A&B, COVID) ARPGX2
Influenza A by PCR: NEGATIVE
Influenza B by PCR: NEGATIVE
SARS Coronavirus 2 by RT PCR: NEGATIVE

## 2021-11-12 LAB — CK: Total CK: 159 U/L (ref 38–234)

## 2021-11-12 NOTE — ED Provider Notes (Signed)
Emergency Medicine Provider Triage Evaluation Note  Virdia Ziesmer , a 80 y.o. female  was evaluated in triage.  Pt complains of arm pain.  Review of Systems  Positive: Joint pain, muscle pain Negative: Fever, cough, congestion, sob, abd pain  Physical Exam  BP 139/89   Pulse 66   Temp 98.4 F (36.9 C) (Oral)   Resp 18   SpO2 100%  Gen:   Awake, no distress   Resp:  Normal effort  MSK:   Moves extremities without difficulty  Other:    Medical Decision Making  Medically screening exam initiated at 11:51 AM.  Appropriate orders placed.  Kortlyn Koltz was informed that the remainder of the evaluation will be completed by another provider, this initial triage assessment does not replace that evaluation, and the importance of remaining in the ED until their evaluation is complete.  Pt endorse polyarthralgia (wrists, elbows, shoulders, hips, knees) and now muscle pain ongoing x 1 month.  Is on multiple medications that could cause muscle aches.  No cold sxs.  Overall well appearing.    Fayrene Helper, PA-C 11/12/21 1155    Terald Sleeper, MD 11/12/21 1254

## 2021-11-12 NOTE — ED Provider Notes (Signed)
[facility] EMERGENCY DEPARTMENT Provider Note   CSN: [id number] Arrival date & time: 11/12/21  1031     History No chief complaint on file.   Nisha Dhami is a 80 y.o. female.  HPI  80 year old female with medical history below presenting to the emergency department with a chief complaint of muscle aches and soreness.  The patient states that her symptoms have been ongoing for the past month.  She endorses stiffness and soreness in her bilateral legs and arms.  She denies any focal weakness or numbness.  She denies any lightheadedness or near syncope.  She denies any fever or chills, cough, shortness of breath or other infectious symptoms.  Past Medical History:  Diagnosis Date   Anxiety    Atrial fibrillation ([place])    Diabetes mellitus    Hyperlipidemia    Hypertension    Memory loss    Mood disorder [facility])    Physical exam, annual 10/22/06   Renal cyst     Patient Active Problem List   Diagnosis Date Noted   Acute midline low back pain without sciatica 11/29/2019   GERD (gastroesophageal reflux disease) 05/12/2019   Arthritis 05/12/2019   Dementia ([place]) 05/12/2019   Hyperlipidemia 05/12/2019   Overactive bladder 05/12/2019   Chronic anticoagulation 03/18/2018   Late onset Alzheimer's disease without behavioral disturbance ([place]) 01/21/2017   Chronic atrial fibrillation ([place]) 11/02/2015   Essential hypertension 11/02/2015   Type 2 diabetes mellitus without complication, without long-term current use of insulin ([place]) 11/02/2015   Cognitive impairment 10/31/2015   Chest pain 11/22/2014   Muscle cramps 04/17/2012   Bilateral shoulder pain 03/09/2012   Essential hypertension 03/28/2011   Diabetes mellitus type 2, noninsulin dependent ([place]) 03/28/2011   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     Family History   Problem Relation Age of Onset   Diabetes Sister     Social History   Tobacco Use   Smoking status: Never   Smokeless tobacco: Never  Vaping Use   Vaping Use: Never used  Substance Use Topics   Alcohol use: No   Drug use: No    Home Medications Prior to Admission medications   Medication Sig Start Date End Date Taking? Authorizing Provider  ACCU-CHEK GUIDE test strip USE AS DIRECTED UP TO THREE TIMES DAILY 01/16/21   [provider]  acetaminophen (TYLENOL) 500 MG tablet Take 500 mg by mouth every 6 (six) hours as needed for pain.    [provider]  amoxicillin-clavulanate (AUGMENTIN) 500-125 MG tablet Take 1 tablet by mouth 2 (two) times daily. 12/20/20   [provider]  apixaban (ELIQUIS) 5 MG TABS tablet Take 1 tablet (5 mg total) by mouth 2 (two) times daily. 11/24/14   Charolette Forward, MD  atenolol (TENORMIN) 50 MG tablet Take 50 mg by mouth daily. 07/29/19   [provider]  atorvastatin (LIPITOR) 40 MG tablet Take 40 mg by mouth at bedtime.    [provider]  azelastine (ASTELIN) 0.1 % nasal spray Place 1 spray into both nostrils as needed for rhinitis or allergies.  05/24/18   [provider]  Blood Glucose Monitoring Suppl (ACCU-CHEK GUIDE) w/Device KIT USE AS DIRECTED UP TO THREE TIMES DAILY 01/16/21   [provider]  cyclobenzaprine (FLEXERIL) 5 MG tablet Take 1-2 tablets (5-10 mg total)  by mouth at bedtime. 06/06/20   Wieters, Hallie C, PA-C  diclofenac Sodium (VOLTAREN) 1 % GEL Apply 2 g topically 4 (four) times daily. 07/08/21   Long, Wonda Olds, MD  dicyclomine (BENTYL) 20 MG tablet Take 20 mg by mouth 3 (three) times daily as needed for spasms. 08/29/18   [provider]  donepezil (ARICEPT) 10 MG tablet Take 1 tablet (10 mg total) by mouth at bedtime. 06/06/17   Rosalin Hawking, MD  esomeprazole (NEXIUM) 40 MG capsule Take 40 mg by mouth daily at 12 noon.    [provider]  estradiol (CLIMARA) 0.06  MG/24HR Place 0.06 patches onto the skin once a week. 08/12/17   [provider]  fluconazole (DIFLUCAN) 150 MG tablet fluconazole 150 mg tablet    [provider]  fluticasone (FLONASE) 50 MCG/ACT nasal spray Place 1 spray into both nostrils daily. 09/15/18   Bast, Tressia Miners A, NP  glimepiride (AMARYL) 4 MG tablet Take 4 mg by mouth daily with breakfast.    [provider]  HYDROcodone-acetaminophen (NORCO/VICODIN) 5-325 MG tablet Take 2 tablets by mouth every 4 (four) hours as needed. 11/29/19   Wardell Honour, MD  losartan (COZAAR) 25 MG tablet Take 25 mg by mouth daily. 05/03/20   [provider]  meloxicam (MOBIC) 7.5 MG tablet Take 1 tablet (7.5 mg total) by mouth daily. 07/16/20   Vanessa Kick, MD  metFORMIN (GLUCOPHAGE) 500 MG tablet Take 500 mg by mouth 2 (two) times daily with a meal.  08/23/15   [provider]  MYRBETRIQ 50 MG TB24 tablet Take 50 mg by mouth daily. 07/29/19   [provider]  nystatin cream (MYCOSTATIN) Apply to affected area 2 times daily 04/02/20   Loura Halt A, NP  omeprazole (PRILOSEC) 40 MG capsule Take 40 mg by mouth daily. 11/27/19   [provider]  pantoprazole (PROTONIX) 20 MG tablet Take 1 tablet (20 mg total) by mouth daily. 02/24/20   Marney Setting, NP  rosuvastatin (CRESTOR) 10 MG tablet Take 10 mg by mouth at bedtime. 09/14/19   [provider]  sulfamethoxazole-trimethoprim (BACTRIM DS) 800-160 MG tablet Take 1 tablet by mouth 2 (two) times daily. 03/12/20   Jaynee Eagles, PA-C  traMADol (ULTRAM) 50 MG tablet Take 50 mg by mouth 2 (two) times daily as needed. for pain 09/24/19   [provider]  VESICARE 10 MG tablet Take 10 mg by mouth daily.  01/17/18   [provider]    Allergies    Metoprolol  Review of Systems   Review of Systems  Constitutional:  Negative for chills and fever.  HENT:  Negative for ear pain and sore throat.   Eyes:  Negative for pain and visual  disturbance.  Respiratory:  Negative for cough and shortness of breath.   Cardiovascular:  Negative for chest pain and palpitations.  Gastrointestinal:  Negative for abdominal pain and vomiting.  Genitourinary:  Negative for dysuria and hematuria.  Musculoskeletal:  Positive for myalgias. Negative for arthralgias and back pain.  Skin:  Negative for color change and rash.  Neurological:  Negative for seizures and syncope.  All other systems reviewed and are negative.  Physical Exam Updated Vital Signs BP 127/85   Pulse 68   Temp 98.4 F (36.9 C) (Oral)   Resp 16   SpO2 100%   Physical Exam Vitals and nursing note reviewed.  Constitutional:      General: She is not in acute distress.  Appearance: She is well-developed.  HENT:     Head: Normocephalic and atraumatic.  Eyes:     Conjunctiva/sclera: Conjunctivae normal.  Cardiovascular:     Rate and Rhythm: Normal rate and regular rhythm.     Pulses: Normal pulses.     Heart sounds: No murmur heard. Pulmonary:     Effort: Pulmonary effort is normal. No respiratory distress.     Breath sounds: Normal breath sounds.  Abdominal:     Palpations: Abdomen is soft.     Tenderness: There is no abdominal tenderness.  Musculoskeletal:        General: No swelling.     Cervical back: Neck supple.     Comments: No lower extremity swelling or posterior calf tenderness bilaterally  Skin:    General: Skin is warm and dry.     Capillary Refill: Capillary refill takes less than 2 seconds.  Neurological:     Mental Status: She is alert.     Comments: MENTAL STATUS EXAM:    Orientation: Alert and oriented to person, place and time.  Memory: Cooperative, follows commands well.  Language: Speech is clear and language is normal.   CRANIAL NERVES:    CN 2 (Optic): Visual fields intact to confrontation.  CN 3,4,6 (EOM): Pupils equal and reactive to light. Full extraocular eye movement without nystagmus.  CN 5 (Trigeminal): Facial sensation  is normal, no weakness of masticatory muscles.  CN 7 (Facial): No facial weakness or asymmetry.  CN 8 (Auditory): Auditory acuity grossly normal.  CN 9,10 (Glossophar): The uvula is midline, the palate elevates symmetrically.  CN 11 (spinal access): Normal sternocleidomastoid and trapezius strength.  CN 12 (Hypoglossal): The tongue is midline. No atrophy or fasciculations.[name]   MOTOR:  Muscle Strength: 5/5RUE, 5/5LUE, 5/5RLE, 5/5LLE.   COORDINATION:   Intact finger-to-nose, no tremor.   SENSATION:   Intact to light touch all four extremities.  GAIT: Gait normal without ataxia   Psychiatric:        Mood and Affect: Mood normal.    ED Results / Procedures / Treatments   Labs (all labs ordered are listed, but only abnormal results are displayed) Labs Reviewed  COMPREHENSIVE METABOLIC PANEL - Abnormal; Notable for the following components:      Result Value   Glucose, Bld 131 (*)    All other components within normal limits  RESP PANEL BY RT-PCR (FLU A&B, COVID) ARPGX2  CBC WITH DIFFERENTIAL/PLATELET  CK    EKG None  Radiology No results found.  Procedures Procedures   Medications Ordered in ED Medications - No data to display  ED Course  I have reviewed the triage vital signs and the nursing notes.  Pertinent labs & imaging results that were available during my care of the patient were reviewed by me and considered in my medical decision making (see chart for details).    MDM Rules/Calculators/A&P                            80 year old female with medical history below presenting to the emergency department with a chief complaint of muscle aches and soreness.  The patient states that her symptoms have been ongoing for the past month.  She endorses stiffness and soreness in her bilateral legs and arms.  She denies any focal weakness or numbness.  She denies any lightheadedness or near syncope.  She denies any fever or chills, cough, shortness of breath or other  infectious symptoms.  On arrival, the patient was afebrile, hemodynamically stable, saturating well on room air.  Physical exam without acute findings.  Patient presenting with roughly 1 month of symptoms of weakness and myalgias.  His considered musculoskeletal injury as the patient states that she had recently been lifting heavy objects at her house and had been doing a lot of housework.  Additionally considered rhabdomyolysis.  The patient is taking a statin.  Additionally considered flulike illness although symptoms have been going on for too long.  Considered electrolyte abnormality as well.  Screening labs obtained to include CBC, CK, CMP, COVID-19 and influenza PCR.  COVID-19 and influenza PCR resulted negative, CMP without acute electrolyte abnormality, normal renal and liver function, CK resulted normal at 159, CBC without anemia, leukocytosis or platelet abnormality.    The patient has been appropriately medically screened and/or stabilized in the ED. I have low suspicion for any other emergent medical condition which would require further screening, evaluation or treatment in the ED or require inpatient management.  Patient is stable for discharge with plan for outpatient management. Final Clinical Impression(s) / ED Diagnoses Final diagnoses:  Myalgia  Musculoskeletal pain    Rx / DC Orders ED Discharge Orders     None        Regan Lemming, MD 11/12/21 1921

## 2021-11-12 NOTE — Progress Notes (Signed)
Orthopedic Tech Progress Note Patient Details:  Jacqueline Ball 15-Mar-1941 [id number]  Ortho Devices Type of Ortho Device: Crutches Ortho Device/Splint Interventions: Ordered, Application, Adjustment   Post Interventions Patient Tolerated: Well, Ambulated well Instructions Provided: Poper ambulation with device RN called and requested crutches for patient. Crutches and crutch training provided to patient. Patient said her arms hurt more than her legs and ambulated fine without crutches. Darleen Crocker 11/12/2021, 4:02 PM

## 2021-11-12 NOTE — ED Triage Notes (Signed)
Patient complains of bilateral arm and shoulder pain x 1 month. Denies injury, denies any illness. Now the past week has had bilateral leg pain. Ambulatory and in no distress

## 2021-11-12 NOTE — Discharge Instructions (Addendum)
You were evaluated in the Emergency Department and after careful evaluation, we did not find any emergent condition requiring admission or further testing in the hospital.  Your exam/testing today was overall reassuring. Your laboratory workup was reassuring with no acute findings. Recommend you follow-up with your PCP in one week to discuss your symptoms and medication regimen.  Please return to the Emergency Department if you experience any worsening of your condition.  Thank you for allowing [place] to be a part of your care.

## 2021-11-23 ENCOUNTER — Emergency Department (HOSPITAL_COMMUNITY): Payer: Medicare HMO

## 2021-11-23 ENCOUNTER — Emergency Department (HOSPITAL_COMMUNITY)
Admission: EM | Admit: 2021-11-23 | Discharge: 2021-11-23 | Disposition: A | Discharge status: Home / Self Care | Payer: Medicare HMO | Attending: Emergency Medicine | Admitting: Emergency Medicine

## 2021-11-23 ENCOUNTER — Encounter (HOSPITAL_COMMUNITY): Payer: Self-pay

## 2021-11-23 ENCOUNTER — Other Ambulatory Visit: Payer: Self-pay

## 2021-11-23 DIAGNOSIS — F0280 Dementia in other diseases classified elsewhere without behavioral disturbance: Secondary | ICD-10-CM | POA: Insufficient documentation

## 2021-11-23 DIAGNOSIS — M79622 Pain in left upper arm: Secondary | ICD-10-CM | POA: Insufficient documentation

## 2021-11-23 DIAGNOSIS — G301 Alzheimer's disease with late onset: Secondary | ICD-10-CM | POA: Diagnosis not present

## 2021-11-23 DIAGNOSIS — I10 Essential (primary) hypertension: Secondary | ICD-10-CM | POA: Insufficient documentation

## 2021-11-23 DIAGNOSIS — M79621 Pain in right upper arm: Secondary | ICD-10-CM | POA: Diagnosis present

## 2021-11-23 DIAGNOSIS — G8929 Other chronic pain: Secondary | ICD-10-CM | POA: Insufficient documentation

## 2021-11-23 DIAGNOSIS — E119 Type 2 diabetes mellitus without complications: Secondary | ICD-10-CM | POA: Insufficient documentation

## 2021-11-23 DIAGNOSIS — Z7984 Long term (current) use of oral hypoglycemic drugs: Secondary | ICD-10-CM | POA: Insufficient documentation

## 2021-11-23 DIAGNOSIS — Z79899 Other long term (current) drug therapy: Secondary | ICD-10-CM | POA: Diagnosis not present

## 2021-11-23 DIAGNOSIS — M7918 Myalgia, other site: Secondary | ICD-10-CM

## 2021-11-23 DIAGNOSIS — Z7901 Long term (current) use of anticoagulants: Secondary | ICD-10-CM | POA: Insufficient documentation

## 2021-11-23 DIAGNOSIS — M79601 Pain in right arm: Secondary | ICD-10-CM

## 2021-11-23 MED ORDER — NAPROXEN 250 MG PO TABS
500.0000 mg | ORAL_TABLET | Freq: Once | ORAL | Status: AC
  Administered 2021-11-23: 500 mg via ORAL
  Filled 2021-11-23: qty 2

## 2021-11-23 MED ORDER — TRAMADOL HCL 50 MG PO TABS: 50.0000 mg | ORAL_TABLET | Freq: Two times a day (BID) | ORAL | 0 refills | Status: DC | PRN

## 2021-11-23 NOTE — Discharge Instructions (Addendum)
We recommend close follow-up with your primary care doctor for further evaluation of your ongoing discomfort.  Your evaluation in the ED has been reassuring.  You have been prescribed a short course of tramadol that you can use for pain.  Do not drive or drink alcohol after taking this medication as it may make you drowsy and impair your judgment.  Return to the ED for any new or concerning symptoms.

## 2021-11-23 NOTE — ED Notes (Signed)
Pt verbalized understanding of d/c instructions, meds, and followup care. Denies questions. VSS, no distress noted. Steady gait to exit with all belongings. Aox4 at DC

## 2021-11-23 NOTE — ED Triage Notes (Signed)
Pt here for severe bilateral arm pain. Pt state arm pain has been ongoing for a couple weeks. Patient states there is minor numbness, but sensation is still present. Patient has had no falls and states this is the first time this chronic pain has occurred.

## 2021-11-23 NOTE — ED Provider Notes (Signed)
[facility] EMERGENCY DEPARTMENT Provider Note   CSN: [id number] Arrival date & time: 11/23/21  1533     History Chief Complaint  Patient presents with   Extremity Pain    Jacqueline Ball is a 80 y.o. female.  80 year old female presents to the emergency department for evaluation of bilateral arm pain.  She states that the pain has been present for the past month.  It has been bothering her more over the past 2 days prompting evaluation.  Patient describes the pain as sharp and shooting, sometimes burning.  She was seen for these complaints 1.5 weeks ago.  Denies any specific change to her symptoms since this visit.  Reports that she has been using over-the-counter analgesics without symptomatic improvement.  Her pain is aggravated when lying on her side in bed as this puts pressure on her arms.  No extremity weakness or numbness, neck pain, fevers, CP, SOB, vision changes, abdominal pain, back pain.  The history is provided by the patient. No language interpreter was used.  Extremity Pain      Past Medical History:  Diagnosis Date   Anxiety    Atrial fibrillation ([place])    Diabetes mellitus    Hyperlipidemia    Hypertension    Memory loss    Mood disorder [facility])    Physical exam, annual 10/22/06   Renal cyst     Patient Active Problem List   Diagnosis Date Noted   Acute midline low back pain without sciatica 11/29/2019   GERD (gastroesophageal reflux disease) 05/12/2019   Arthritis 05/12/2019   Dementia ([place]) 05/12/2019   Hyperlipidemia 05/12/2019   Overactive bladder 05/12/2019   Chronic anticoagulation 03/18/2018   Late onset Alzheimer's disease without behavioral disturbance ([place]) 01/21/2017   Chronic atrial fibrillation ([place]) 11/02/2015   Essential hypertension 11/02/2015   Type 2 diabetes mellitus without complication, without long-term current use of insulin ([place]) 11/02/2015   Cognitive impairment 10/31/2015   Chest pain 11/22/2014   Muscle  cramps 04/17/2012   Bilateral shoulder pain 03/09/2012   Essential hypertension 03/28/2011   Diabetes mellitus type 2, noninsulin dependent ([place]) 03/28/2011   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     Family History  Problem Relation Age of Onset   Diabetes Sister     Social History   Tobacco Use   Smoking status: Never   Smokeless tobacco: Never  Vaping Use   Vaping Use: Never used  Substance Use Topics   Alcohol use: No   Drug use: No    Home Medications Prior to Admission medications   Medication Sig Start Date End Date Taking? Authorizing Provider  ACCU-CHEK GUIDE test strip USE AS DIRECTED UP TO THREE TIMES DAILY 01/16/21   [provider]  acetaminophen (TYLENOL) 500 MG tablet Take 500 mg by mouth every 6 (six) hours as needed for pain.    [provider]  amoxicillin-clavulanate (AUGMENTIN) 500-125 MG tablet Take 1 tablet by mouth 2 (two) times daily. 12/20/20   [provider]  apixaban (ELIQUIS) 5 MG TABS tablet Take 1 tablet (5 mg total) by mouth 2 (two) times daily. 11/24/14   Charolette Forward, MD  atenolol (TENORMIN) 50 MG tablet Take 50 mg by mouth daily. 07/29/19   [provider]  atorvastatin (LIPITOR) 40 MG tablet Take 40 mg by mouth at bedtime.  [provider]  azelastine (ASTELIN) 0.1 % nasal spray Place 1 spray into both nostrils as needed for rhinitis or allergies.  05/24/18   [provider]  Blood Glucose Monitoring Suppl (ACCU-CHEK GUIDE) w/Device KIT USE AS DIRECTED UP TO THREE TIMES DAILY 01/16/21   [provider]  cyclobenzaprine (FLEXERIL) 5 MG tablet Take 1-2 tablets (5-10 mg total) by mouth at bedtime. 06/06/20   Wieters, Hallie C, PA-C  dicyclomine (BENTYL) 20 MG tablet Take 20 mg by mouth 3 (three) times daily as needed for spasms. 08/29/18   [provider]  donepezil (ARICEPT) 10 MG tablet Take 1 tablet (10 mg total) by mouth at bedtime. 06/06/17   Rosalin Hawking, MD  esomeprazole (NEXIUM) 40 MG capsule Take 40 mg by mouth daily at 12 noon.    [provider]  estradiol (CLIMARA) 0.06 MG/24HR Place 0.06 patches onto the skin once a week. 08/12/17   [provider]  fluconazole (DIFLUCAN) 150 MG tablet fluconazole 150 mg tablet    [provider]  fluticasone (FLONASE) 50 MCG/ACT nasal spray Place 1 spray into both nostrils daily. 09/15/18   Bast, Tressia Miners A, NP  glimepiride (AMARYL) 4 MG tablet Take 4 mg by mouth daily with breakfast.    [provider]  losartan (COZAAR) 25 MG tablet Take 25 mg by mouth daily. 05/03/20   [provider]  metFORMIN (GLUCOPHAGE) 500 MG tablet Take 500 mg by mouth 2 (two) times daily with a meal.  08/23/15   [provider]  MYRBETRIQ 50 MG TB24 tablet Take 50 mg by mouth daily. 07/29/19   [provider]  nystatin cream (MYCOSTATIN) Apply to affected area 2 times daily 04/02/20   Loura Halt A, NP  omeprazole (PRILOSEC) 40 MG capsule Take 40 mg by mouth daily. 11/27/19   [provider]  pantoprazole (PROTONIX) 20 MG tablet Take 1 tablet (20 mg total) by mouth daily. 02/24/20   Marney Setting, NP  rosuvastatin (CRESTOR) 10 MG tablet Take 10 mg by mouth at bedtime. 09/14/19   [provider]  sulfamethoxazole-trimethoprim (BACTRIM DS) 800-160 MG tablet Take 1 tablet by mouth 2 (two) times daily. 03/12/20   Jaynee Eagles, PA-C  traMADol (ULTRAM) 50 MG tablet Take 1 tablet (50 mg total) by mouth 2 (two) times daily as needed for severe pain. for pain 11/23/21   Antonietta Breach, PA-C  VESICARE 10 MG tablet Take 10 mg by mouth daily.  01/17/18   [provider]    Allergies    Metoprolol  Review of Systems   Review of Systems Ten systems reviewed and are negative for acute change, except as noted in the HPI.    Physical  Exam Updated Vital Signs BP (!) 156/106   Pulse 89   Temp 98.4 F (36.9 C) (Temporal)   Resp 18   SpO2 98%   Physical Exam Vitals and nursing note reviewed.  Constitutional:      General: She is not in acute distress.    Appearance: She is well-developed. She is not diaphoretic.     Comments: Nontoxic-appearing and in no distress  HENT:     Head: Normocephalic and atraumatic.  Eyes:     General: No scleral icterus.    Conjunctiva/sclera: Conjunctivae normal.  Neck:     Comments: No meningismus Cardiovascular:     Rate and Rhythm: Normal rate and regular rhythm.     Pulses: Normal pulses.     Comments: Distal radial  pulse 2+ bilaterally Pulmonary:     Effort: Pulmonary effort is normal. No respiratory distress.     Breath sounds: No stridor. No wheezing.     Comments: Respirations even and unlabored Musculoskeletal:        General: Normal range of motion.     Cervical back: Normal range of motion.     Comments: Preserved active and passive range of motion of bilateral upper extremities, though movement does aggravate discomfort.  Noted to be tender to palpation along the proximal bilateral upper extremities without crepitus, deformity.  Compartments soft.  Skin:    General: Skin is warm and dry.     Coloration: Skin is not pale.     Findings: No erythema or rash.  Neurological:     Mental Status: She is alert and oriented to person, place, and time.     Coordination: Coordination normal.     Comments: Grip strength 5/5 bilaterally.  Patient with preserved strength against resistance in all major muscle groups of the bilateral upper extremities.  She is ambulatory in the department.  Sensation to light touch intact and equal bilaterally  Psychiatric:        Behavior: Behavior normal.    ED Results / Procedures / Treatments   Labs (all labs ordered are listed, but only abnormal results are displayed) Labs Reviewed - No data to display  EKG None  Radiology CT  Cervical Spine Wo Contrast  Result Date: 11/23/2021 CLINICAL DATA:  Cervical radiculopathy. EXAM: CT CERVICAL SPINE WITHOUT CONTRAST TECHNIQUE: Multidetector CT imaging of the cervical spine was performed without intravenous contrast. Multiplanar CT image reconstructions were also generated. COMPARISON:  MRI cervical spine 12/07/2016. cervical spine CT 01/20/2019. FINDINGS: Alignment: There is stable 2 mm of anterolisthesis at C4-C5 and stable 2 mm of retrolisthesis at C5-C6. This is likely degenerative. Alignment is otherwise anatomic. Skull base and vertebrae: No acute fracture. No focal osseous lesion. Soft tissues and spinal canal: No prevertebral fluid or swelling. No visible canal hematoma. Disc levels: Disc space narrowing and endplate osteophyte formation throughout the cervical spine has mildly progressed at C3-C4. There is no significant central canal stenosis at any level. Mild neural foraminal stenosis on the right at C6-C7 and on the left at C5-C6 appear stable. Upper chest: Negative. Other: None. IMPRESSION: 1.  No acute fracture or dislocation. 2. Degenerative changes throughout the cervical spine have minimally progressed at C3-C4. No severe central canal or neural foraminal stenosis identified. Electronically Signed   By: Ronney Asters M.D.   On: 11/23/2021 18:34    Procedures Procedures   Medications Ordered in ED Medications  naproxen (NAPROSYN) tablet 500 mg (500 mg Oral Given 11/23/21 1956)    ED Course  I have reviewed the triage vital signs and the nursing notes.  Pertinent labs & imaging results that were available during my care of the patient were reviewed by me and considered in my medical decision making (see chart for details).    MDM Rules/Calculators/A&P                           80 year old female presents to the emergency department for ongoing chronic pain in her bilateral arms.  She is tender mostly with palpation of the triceps and biceps bilaterally.   Compartments of the upper extremities are soft, compressible.  No crepitus, deformity.  She has preserved range of motion.  Neurovascularly intact with preserved strength.  Seen for these complaints 1.5  weeks ago with reassuring laboratory evaluation, negative CK.  CT of the neck was ordered in triage which is fairly reassuring given the patient's age.  Degenerative changes at C3/C4 with minimal progression since 2017.  Symptoms consistent with musculoskeletal etiology.  We will continue with outpatient supportive management.  She is on atorvastatin and did discuss with the patient that it may be worthwhile reviewing her medications with her primary doctor.  Upon further probing at the end of assessment, she states that her discomfort began after she moved her king bed down a flight of stairs on her own.  It is likely that this precipitated onset of her discomfort.  Do not feel further emergent assessment is presently indicated.  Return precautions discussed and provided. Patient discharged in stable condition with no unaddressed concerns.   Final Clinical Impression(s) / ED Diagnoses Final diagnoses:  Pain in both upper extremities  Musculoskeletal pain    Rx / DC Orders ED Discharge Orders          Ordered    traMADol (ULTRAM) 50 MG tablet  2 times daily PRN        11/23/21 1950             Antonietta Breach, PA-C 11/23/21 2346    Regan Lemming, MD 11/24/21 [phone]

## 2021-11-23 NOTE — ED Provider Notes (Signed)
Emergency Medicine Provider Triage Evaluation Note  Jacqueline Ball , a 80 y.o. female  was evaluated in triage.  Pt complains of neck pain and bilateral arm pain has been ongoing for 2 days.  Denies any trauma or injury.  Pain radiates from the neck down to both arms.  Described as a shooting and sharp sensation.  Intermittent burning at times.  No weakness or numbness.  Review of Systems  Positive:  Negative: See above   Physical Exam  BP 135/80 (BP Location: Right Arm)   Pulse 95   Temp 99.5 F (37.5 C) (Oral)   Resp 16   SpO2 98%  Gen:   Awake, no distress   Resp:  Normal effort  MSK:   Moves extremities without difficulty  Other:  Positive Spurling's maneuver  Medical Decision Making  Medically screening exam initiated at 5:49 PM.  Appropriate orders placed.  Greenlee Ancheta was informed that the remainder of the evaluation will be completed by another provider, this initial triage assessment does not replace that evaluation, and the importance of remaining in the ED until their evaluation is complete.  Suspicious for cervical radiculopathy.   Honor Loh Vinco, PA-C 11/23/21 1751    Linwood Dibbles, MD 11/24/21 [phone]

## 2021-12-08 ENCOUNTER — Ambulatory Visit (HOSPITAL_COMMUNITY)
Admission: EM | Admit: 2021-12-08 | Discharge: 2021-12-08 | Disposition: A | Discharge status: Home / Self Care | Payer: Medicare HMO | Attending: Urgent Care | Admitting: Urgent Care

## 2021-12-08 ENCOUNTER — Other Ambulatory Visit: Payer: Self-pay

## 2021-12-08 ENCOUNTER — Encounter (HOSPITAL_COMMUNITY): Payer: Self-pay

## 2021-12-08 DIAGNOSIS — M25511 Pain in right shoulder: Secondary | ICD-10-CM

## 2021-12-08 DIAGNOSIS — M19022 Primary osteoarthritis, left elbow: Secondary | ICD-10-CM

## 2021-12-08 DIAGNOSIS — M25512 Pain in left shoulder: Secondary | ICD-10-CM

## 2021-12-08 DIAGNOSIS — G5603 Carpal tunnel syndrome, bilateral upper limbs: Secondary | ICD-10-CM | POA: Diagnosis not present

## 2021-12-08 DIAGNOSIS — M19021 Primary osteoarthritis, right elbow: Secondary | ICD-10-CM | POA: Diagnosis not present

## 2021-12-08 MED ORDER — DICLOFENAC SODIUM 1 % EX GEL: CUTANEOUS | 1 refills | Status: DC

## 2021-12-08 NOTE — Discharge Instructions (Addendum)
May purchase Salonpas 4% lidocaine patch to apply to shoulders over the counter. May take tylenol daily with a maximum of two 500mg  tabs at a time, up to 6 tabs in a 24 hour period. Wear wrist braces, especially at night. Apply topical voltaren gel to affected areas of wrist and shoulder. Please avoid using OTC advil or other NSAIDs due to interaction with your blood thinner. Please follow up with your PCP should symptoms persist.

## 2021-12-08 NOTE — ED Provider Notes (Signed)
[place]    CSN: [id number] Arrival date & time: 12/08/21  1500      History   Chief Complaint Chief Complaint  Patient presents with   Hand Pain    HPI Jacqueline Ball is a 80 y.o. female.   Very pleasant 80 year old female presents today due to concerns of shoulder pain, arm pain, hand pain.  Patient does have a history of dementia and history was somewhat scattered.  She states however for "some time "she has had pain from her shoulders to her elbows bilaterally, no pain in her forearms, and pain again in her hands bilaterally.  She states the pain is worse to the palmar aspect over her wrist and thenar eminence.  She states it occurs all the time, but worse at night.  It does occasionally burn and sting.  She states that the shoulders really started hurting after she pushed a heavy king-size mattress down the stairs.  She denies any injury.  She states overall her shoulders and arms hurt all the time.  She has a known history of osteoarthritis.  She cannot take NSAIDs as she is on Eliquis.  She has been taking Tylenol which has helped minimally.  Patient also has prescriptions for tramadol and Flexeril by her PCP.  She has not tried these for her discomfort.  She denies any additional symptoms today.   Hand Pain   Past Medical History:  Diagnosis Date   Anxiety    Atrial fibrillation ([place])    Diabetes mellitus    Hyperlipidemia    Hypertension    Memory loss    Mood disorder [facility])    Physical exam, annual 10/22/06   Renal cyst     Patient Active Problem List   Diagnosis Date Noted   Acute midline low back pain without sciatica 11/29/2019   GERD (gastroesophageal reflux disease) 05/12/2019   Arthritis 05/12/2019   Dementia ([place]) 05/12/2019   Hyperlipidemia 05/12/2019   Overactive bladder 05/12/2019   Chronic anticoagulation 03/18/2018   Late onset Alzheimer's disease without behavioral disturbance ([place]) 01/21/2017   Chronic atrial fibrillation ([place])  11/02/2015   Essential hypertension 11/02/2015   Type 2 diabetes mellitus without complication, without long-term current use of insulin ([place]) 11/02/2015   Cognitive impairment 10/31/2015   Chest pain 11/22/2014   Muscle cramps 04/17/2012   Bilateral shoulder pain 03/09/2012   Essential hypertension 03/28/2011   Diabetes mellitus type 2, noninsulin dependent ([place]) 03/28/2011   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      Home Medications    Prior to Admission medications   Medication Sig Start Date End Date Taking? Authorizing Provider  diclofenac Sodium (VOLTAREN) 1 % GEL Apply one application (2g) up to four times daily as needed for pain. 12/08/21  Yes Jimesha Rising L, PA  ACCU-CHEK GUIDE test strip USE AS DIRECTED UP TO THREE TIMES DAILY 01/16/21   [provider]  acetaminophen (TYLENOL) 500 MG tablet Take 500 mg by mouth every 6 (six) hours as needed for pain.    [provider]  apixaban (ELIQUIS) 5 MG TABS tablet Take 1 tablet (5 mg total) by mouth 2 (two) times daily. 11/24/14   Charolette Forward, MD  atenolol (TENORMIN) 50 MG tablet Take 50 mg by mouth daily. 07/29/19   [provider]  atorvastatin (LIPITOR) 40 MG tablet Take  40 mg by mouth at bedtime.    [provider]  azelastine (ASTELIN) 0.1 % nasal spray Place 1 spray into both nostrils as needed for rhinitis or allergies.  05/24/18   [provider]  Blood Glucose Monitoring Suppl (ACCU-CHEK GUIDE) w/Device KIT USE AS DIRECTED UP TO THREE TIMES DAILY 01/16/21   [provider]  cyclobenzaprine (FLEXERIL) 5 MG tablet Take 1-2 tablets (5-10 mg total) by mouth at bedtime. 06/06/20   Wieters, Hallie C, PA-C  dicyclomine (BENTYL) 20 MG tablet Take 20 mg by mouth 3 (three) times daily as needed for spasms. 08/29/18   [provider]  donepezil  (ARICEPT) 10 MG tablet Take 1 tablet (10 mg total) by mouth at bedtime. 06/06/17   Rosalin Hawking, MD  esomeprazole (NEXIUM) 40 MG capsule Take 40 mg by mouth daily at 12 noon.    [provider]  estradiol (CLIMARA) 0.06 MG/24HR Place 0.06 patches onto the skin once a week. 08/12/17   [provider]  fluconazole (DIFLUCAN) 150 MG tablet fluconazole 150 mg tablet    [provider]  fluticasone (FLONASE) 50 MCG/ACT nasal spray Place 1 spray into both nostrils daily. 09/15/18   Bast, Tressia Miners A, NP  glimepiride (AMARYL) 4 MG tablet Take 4 mg by mouth daily with breakfast.    [provider]  losartan (COZAAR) 25 MG tablet Take 25 mg by mouth daily. 05/03/20   [provider]  metFORMIN (GLUCOPHAGE) 500 MG tablet Take 500 mg by mouth 2 (two) times daily with a meal.  08/23/15   [provider]  MYRBETRIQ 50 MG TB24 tablet Take 50 mg by mouth daily. 07/29/19   [provider]  nystatin cream (MYCOSTATIN) Apply to affected area 2 times daily 04/02/20   Loura Halt A, NP  omeprazole (PRILOSEC) 40 MG capsule Take 40 mg by mouth daily. 11/27/19   [provider]  pantoprazole (PROTONIX) 20 MG tablet Take 1 tablet (20 mg total) by mouth daily. 02/24/20   Marney Setting, NP  rosuvastatin (CRESTOR) 10 MG tablet Take 10 mg by mouth at bedtime. 09/14/19   [provider]  sulfamethoxazole-trimethoprim (BACTRIM DS) 800-160 MG tablet Take 1 tablet by mouth 2 (two) times daily. 03/12/20   Jaynee Eagles, PA-C  traMADol (ULTRAM) 50 MG tablet Take 1 tablet (50 mg total) by mouth 2 (two) times daily as needed for severe pain. for pain 11/23/21   Antonietta Breach, PA-C  VESICARE 10 MG tablet Take 10 mg by mouth daily.  01/17/18   [provider]    Family History Family History  Problem Relation Age of Onset   Diabetes Sister     Social History Social History   Tobacco Use   Smoking status: Never   Smokeless tobacco: Never  Vaping Use    Vaping Use: Never used  Substance Use Topics   Alcohol use: No   Drug use: No     Allergies   Metoprolol   Review of Systems Review of Systems As per hpi  Physical Exam Triage Vital Signs ED Triage Vitals  Enc Vitals Group     BP 12/08/21 1608 139/69     Pulse Rate 12/08/21 1608 72     Resp 12/08/21 1608 20     Temp 12/08/21 1608 98.1 F (36.7 C)     Temp Source 12/08/21 1608 Oral     SpO2 12/08/21 1608 95 %     Weight --      Height --  Head Circumference --      Peak Flow --      Pain Score 12/08/21 1609 9     Pain Loc --      Pain Edu? --      Excl. in [place]? --    No data found.  Updated Vital Signs BP 139/69 (BP Location: Right Arm)    Pulse 72    Temp 98.1 F (36.7 C) (Oral)    Resp 20    SpO2 95%   Visual Acuity Right Eye Distance:   Left Eye Distance:   Bilateral Distance:    Right Eye Near:   Left Eye Near:    Bilateral Near:     Physical Exam Vitals and nursing note reviewed.  Constitutional:      General: She is not in acute distress.    Appearance: She is well-developed.  HENT:     Head: Normocephalic and atraumatic.  Eyes:     Conjunctiva/sclera: Conjunctivae normal.  Cardiovascular:     Rate and Rhythm: Normal rate and regular rhythm.     Heart sounds: No murmur heard. Pulmonary:     Effort: Pulmonary effort is normal. No respiratory distress.     Breath sounds: Normal breath sounds.  Abdominal:     Palpations: Abdomen is soft.     Tenderness: There is no abdominal tenderness.  Musculoskeletal:        General: No swelling, tenderness, deformity or signs of injury. Normal range of motion.     Cervical back: Neck supple.     Right lower leg: No edema.     Left lower leg: No edema.     Comments: Full range of motion of bilateral shoulders.  No tenderness to bony prominences, no reproducible discomfort.  Strength equal bilaterally. No abnormalities noted to elbows. Heberden's and Bouchard's nodes noted to bilateral hands, signs of  osteoarthritis noted.  Full range of motion of hands with good grip strength.  Positive Tinel's sign, positive Phalen sign.  Negative Finkelstein test.  Good cap refill  Skin:    General: Skin is warm and dry.     Capillary Refill: Capillary refill takes less than 2 seconds.  Neurological:     Mental Status: She is alert.  Psychiatric:        Mood and Affect: Mood normal.     UC Treatments / Results  Labs (all labs ordered are listed, but only abnormal results are displayed) Labs Reviewed - No data to display  EKG   Radiology No results found.  Procedures Procedures (including critical care time)  Medications Ordered in UC Medications - No data to display  Initial Impression / Assessment and Plan / UC Course  I have reviewed the triage vital signs and the nursing notes.  Pertinent labs & imaging results that were available during my care of the patient were reviewed by me and considered in my medical decision making (see chart for details).     Carpal tunnel syndrome - will do bilateral wrist splints. F/U with ortho if sx persist. Pt states she already takes gabapentin. Should pain - clinically suspect OA. No visible abnormalities noted. May do trial of lidocaine patch vs topical voltaren gel. Cannot prescribed additional PO meds as pt already has tramadol, flexeril at home. Cannot have PO NSAIDS due to eliquis. Continue tylenol  Final Clinical Impressions(s) / UC Diagnoses   Final diagnoses:  Bilateral carpal tunnel syndrome  Pain of both shoulder joints  Osteoarthritis involving joints of upper  arms, bilateral     Discharge Instructions      May purchase Salonpas 4% lidocaine patch to apply to shoulders over the counter. May take tylenol daily with a maximum of two 545m tabs at a time, up to 6 tabs in a 24 hour period. Wear wrist braces, especially at night. Apply topical voltaren gel to affected areas of wrist and shoulder. Please avoid using OTC advil or  other NSAIDs due to interaction with your blood thinner. Please follow up with your PCP should symptoms persist.    ED Prescriptions     Medication Sig Dispense Auth. Provider   diclofenac Sodium (VOLTAREN) 1 % GEL Apply one application (2g) up to four times daily as needed for pain. 100 g Pansey Pinheiro, WBaltaL, PU[place]     PDMP not reviewed this encounter.   CChaney Malling PUtah12/23/22 2102

## 2021-12-08 NOTE — ED Triage Notes (Signed)
Pt c/o bilateral hand pain radiating up to shoulders x1wk. States pain started after pushing a king size mattress outside by herself.

## 2021-12-15 ENCOUNTER — Encounter (HOSPITAL_COMMUNITY): Payer: Self-pay

## 2021-12-15 ENCOUNTER — Other Ambulatory Visit: Payer: Self-pay

## 2021-12-15 ENCOUNTER — Emergency Department (HOSPITAL_COMMUNITY)
Admission: EM | Admit: 2021-12-15 | Discharge: 2021-12-15 | Disposition: A | Discharge status: Home / Self Care | Payer: Medicare HMO | Attending: Emergency Medicine | Admitting: Emergency Medicine

## 2021-12-15 DIAGNOSIS — Z7984 Long term (current) use of oral hypoglycemic drugs: Secondary | ICD-10-CM | POA: Diagnosis not present

## 2021-12-15 DIAGNOSIS — G309 Alzheimer's disease, unspecified: Secondary | ICD-10-CM | POA: Diagnosis not present

## 2021-12-15 DIAGNOSIS — Z7901 Long term (current) use of anticoagulants: Secondary | ICD-10-CM | POA: Insufficient documentation

## 2021-12-15 DIAGNOSIS — M79601 Pain in right arm: Secondary | ICD-10-CM

## 2021-12-15 DIAGNOSIS — M25512 Pain in left shoulder: Secondary | ICD-10-CM | POA: Diagnosis not present

## 2021-12-15 DIAGNOSIS — Z7951 Long term (current) use of inhaled steroids: Secondary | ICD-10-CM | POA: Insufficient documentation

## 2021-12-15 DIAGNOSIS — Z794 Long term (current) use of insulin: Secondary | ICD-10-CM | POA: Diagnosis not present

## 2021-12-15 DIAGNOSIS — M25511 Pain in right shoulder: Secondary | ICD-10-CM | POA: Diagnosis not present

## 2021-12-15 DIAGNOSIS — I10 Essential (primary) hypertension: Secondary | ICD-10-CM | POA: Insufficient documentation

## 2021-12-15 NOTE — ED Triage Notes (Addendum)
Patient c/o bilateral shoulder, hand, and arm cramping x 1 month.

## 2021-12-15 NOTE — ED Provider Notes (Signed)
[place] DEPT Provider Note   CSN: [id number] Arrival date & time: 12/15/21  1639     History Chief Complaint  Patient presents with   Shoulder Pain   Arm Pain    Jacqueline Ball is a 80 y.o. female.  HPI Patient is a 80 year old female with a medical history as noted below.  She presents to the emergency department for reevaluation of pain in both upper extremities.  Patient initially seen for this on November 27 and was reevaluated subsequently on December 8 as well as December 23.  She states that she has been taking Tylenol as well as gabapentin for symptoms with mild short-term relief.  She has not followed up with her PCP.  She is a known history of osteoarthritis and is anticoagulated on Eliquis.  She states that she has not had any new falls or injuries but her pain has continued to persist so she came to the emergency department.  States the pain is worst around the bilateral upper arms as well as her hands.  No numbness, weakness, tingling, neck pain, fevers, chills, nausea, vomiting, chest pain, shortness of breath.    Past Medical History:  Diagnosis Date   Anxiety    Atrial fibrillation ([place])    Diabetes mellitus    Hyperlipidemia    Hypertension    Memory loss    Mood disorder [facility])    Physical exam, annual 10/22/06   Renal cyst     Patient Active Problem List   Diagnosis Date Noted   Acute midline low back pain without sciatica 11/29/2019   GERD (gastroesophageal reflux disease) 05/12/2019   Arthritis 05/12/2019   Dementia ([place]) 05/12/2019   Hyperlipidemia 05/12/2019   Overactive bladder 05/12/2019   Chronic anticoagulation 03/18/2018   Late onset Alzheimer's disease without behavioral disturbance ([place]) 01/21/2017   Chronic atrial fibrillation ([place]) 11/02/2015   Essential hypertension 11/02/2015   Type 2 diabetes mellitus without complication, without long-term current use of insulin ([place]) 11/02/2015   Cognitive impairment  10/31/2015   Chest pain 11/22/2014   Muscle cramps 04/17/2012   Bilateral shoulder pain 03/09/2012   Essential hypertension 03/28/2011   Diabetes mellitus type 2, noninsulin dependent ([place]) 03/28/2011   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     Family History  Problem Relation Age of Onset   Diabetes Sister     Social History   Tobacco Use   Smoking status: Never   Smokeless tobacco: Never  Vaping Use   Vaping Use: Never used  Substance Use Topics   Alcohol use: No   Drug use: No    Home Medications Prior to Admission medications   Medication Sig Start Date End Date Taking? Authorizing Provider  ACCU-CHEK GUIDE test strip USE AS DIRECTED UP TO THREE TIMES DAILY 01/16/21   [provider]  acetaminophen (TYLENOL) 500 MG tablet Take 500 mg by mouth every 6 (six) hours as needed for pain.    [provider]  apixaban (ELIQUIS) 5 MG TABS tablet Take 1 tablet (5 mg total) by mouth 2 (two) times daily. 11/24/14   Charolette Forward, MD  atenolol (TENORMIN) 50 MG tablet Take 50 mg by mouth daily. 07/29/19   [provider]  atorvastatin (LIPITOR) 40 MG tablet Take 40 mg by mouth at bedtime.    [provider]  azelastine (ASTELIN) 0.1 % nasal spray Place 1 spray into both nostrils as needed for rhinitis or allergies.  05/24/18   [provider]  Blood Glucose Monitoring Suppl (ACCU-CHEK GUIDE) w/Device KIT USE AS DIRECTED UP TO THREE TIMES DAILY 01/16/21   [provider]  cyclobenzaprine (FLEXERIL) 5 MG tablet Take 1-2 tablets (5-10 mg total) by mouth at bedtime. 06/06/20   Wieters, Hallie C, PA-C  diclofenac Sodium (VOLTAREN) 1 % GEL Apply one application (2g) up to four times daily as needed for pain. 12/08/21   Crain, Whitney L, PA  dicyclomine (BENTYL) 20 MG tablet Take 20 mg by mouth 3 (three) times  daily as needed for spasms. 08/29/18   [provider]  donepezil (ARICEPT) 10 MG tablet Take 1 tablet (10 mg total) by mouth at bedtime. 06/06/17   Rosalin Hawking, MD  esomeprazole (NEXIUM) 40 MG capsule Take 40 mg by mouth daily at 12 noon.    [provider]  estradiol (CLIMARA) 0.06 MG/24HR Place 0.06 patches onto the skin once a week. 08/12/17   [provider]  fluconazole (DIFLUCAN) 150 MG tablet fluconazole 150 mg tablet    [provider]  fluticasone (FLONASE) 50 MCG/ACT nasal spray Place 1 spray into both nostrils daily. 09/15/18   Bast, Tressia Miners A, NP  glimepiride (AMARYL) 4 MG tablet Take 4 mg by mouth daily with breakfast.    [provider]  losartan (COZAAR) 25 MG tablet Take 25 mg by mouth daily. 05/03/20   [provider]  metFORMIN (GLUCOPHAGE) 500 MG tablet Take 500 mg by mouth 2 (two) times daily with a meal.  08/23/15   [provider]  MYRBETRIQ 50 MG TB24 tablet Take 50 mg by mouth daily. 07/29/19   [provider]  nystatin cream (MYCOSTATIN) Apply to affected area 2 times daily 04/02/20   Loura Halt A, NP  omeprazole (PRILOSEC) 40 MG capsule Take 40 mg by mouth daily. 11/27/19   [provider]  pantoprazole (PROTONIX) 20 MG tablet Take 1 tablet (20 mg total) by mouth daily. 02/24/20   Marney Setting, NP  rosuvastatin (CRESTOR) 10 MG tablet Take 10 mg by mouth at bedtime. 09/14/19   [provider]  sulfamethoxazole-trimethoprim (BACTRIM DS) 800-160 MG tablet Take 1 tablet by mouth 2 (two) times daily. 03/12/20   Jaynee Eagles, PA-C  traMADol (ULTRAM) 50 MG tablet Take 1 tablet (50 mg total) by mouth 2 (two) times daily as needed for severe pain. for pain 11/23/21   Antonietta Breach, PA-C  VESICARE 10 MG tablet Take 10 mg by mouth daily.  01/17/18   [provider]    Allergies    Metoprolol  Review of Systems   Review of Systems  Constitutional:  Negative for chills and fever.   Respiratory:  Negative for shortness of breath.   Cardiovascular:  Negative for chest pain.  Gastrointestinal:  Negative for nausea and vomiting.  Musculoskeletal:  Positive for arthralgias and myalgias. Negative for neck pain and neck stiffness.  Skin:  Negative for color change.  Neurological:  Negative for weakness, numbness and headaches.   Physical Exam Updated Vital Signs BP 122/71 (BP Location: Left Arm)    Pulse 85    Temp 98.3 F (36.8 C) (Oral)    Resp 18    Ht 5' 4.5" (1.638 m)    Wt 59 kg    SpO2 100%    BMI 21.97 kg/m   Physical Exam Vitals and nursing note reviewed.  Constitutional:      General: She is not in acute distress.    Appearance: Normal appearance. She is not ill-appearing, toxic-appearing or diaphoretic.  HENT:     Head: Normocephalic and atraumatic.     Right Ear: External ear normal.     Left Ear: External ear normal.     Nose: Nose normal.     Mouth/Throat:     Mouth: Mucous membranes are moist.     Pharynx: Oropharynx is clear. No oropharyngeal exudate or posterior oropharyngeal erythema.  Eyes:     Extraocular Movements: Extraocular movements intact.  Cardiovascular:     Rate and Rhythm: Normal rate and regular rhythm.     Pulses: Normal pulses.     Heart sounds: Normal heart sounds. No murmur heard.   No friction rub. No gallop.  Pulmonary:     [place]: Pulmonary effort is normal. No respiratory distress.     Breath sounds: Normal breath sounds. No stridor. No wheezing, rhonchi or rales.  Abdominal:     General: Abdomen is flat.     Tenderness: There is no abdominal tenderness.  Musculoskeletal:        General: Normal range of motion.     Cervical back: Normal range of motion and neck supple. No tenderness.     Comments: No palpable tenderness appreciated in the bilateral upper extremities.  Pain appears to be reproducible in the biceps with resisted flexion and extension at the elbows.  Full range of motion of the bilateral shoulders,  elbows, and wrist.  Strength is 5/5 in the bilateral upper extremities.  Grip strength is 5/5.  Negative Tinel's sign.  Distal sensation intact.  2+ radial pulses.  Skin:    General: Skin is warm and dry.  Neurological:     General: No focal deficit present.     Mental Status: She is alert and oriented to person, place, and time.  Psychiatric:        Mood and Affect: Mood normal.        Behavior: Behavior normal.    ED Results / Procedures / Treatments   Labs (all labs ordered are listed, but only abnormal results are displayed) Labs Reviewed - No data to display  EKG None  Radiology No results found.  Procedures Procedures   Medications Ordered in ED Medications - No data to display  ED Course  I have reviewed the triage vital signs and the nursing notes.  Pertinent labs & imaging results that were available during my care of the patient were reviewed by me and considered in my medical decision making (see chart for details).    MDM Rules/Calculators/A&P                          Patient is a 80 year old female who presents to the emergency department for reevaluation of atraumatic upper extremity pain.  Symptoms started about 1.5 months ago.  Patient denies any new falls or trauma.  Unsure the source of the patient's symptoms.  On my exam patient has no palpable tenderness in the upper extremities.  Neurovascularly intact in both arms.  Strength is 5/5.  Full range of motion of all the joints of both arms.  On her initial evaluation in November CBC, CMP, and CK were obtained which were reassuring.  CK within normal limits at 159.  CBC without leukocytosis.  Patient denies any systemic complaint such as fevers, chills, nausea, or vomiting.  No chest pain  or shortness of breath.  Heart is regular rate and rhythm without murmurs, rubs, or gallops.  Patient afebrile and not tachycardic.  Symptoms could possibly be secondary to a radiculopathy.  CT scan was obtained of the cervical  spine on December 8 which showed no acute fracture or dislocation.  She had degenerative changes to the cervical spine which minimally progressed at C3-4.  No severe central canal or neural for aminal stenosis identified.  Feel that patient would benefit from reevaluation with orthopedics.  We will give patient a referral.  We discussed return precautions.  Feel that she is stable for discharge at this time and she is agreeable.  Recommended continued use of Tylenol as well as gabapentin.  Her questions were answered and she was amicable at the time of discharge.  Final Clinical Impression(s) / ED Diagnoses Final diagnoses:  Pain in both upper extremities   Rx / DC Orders ED Discharge Orders     None        Rayna Sexton, PA-C 12/15/21 2356    Daleen Bo, MD 12/16/21 [phone]

## 2021-12-15 NOTE — ED Provider Notes (Signed)
Emergency Medicine Provider Triage Evaluation Note  Jacqueline Ball , a 80 y.o. female  was evaluated in triage.  Pt complains of bilateral shoulder pain and cramping.  She states that she has had this pain for over a month now.  She states that its intermittent and feels like cramping pain that can go to her hands.  She denies any trauma or falls.  She denies chest pain or shortness of breath.  She states that she has been using Tylenol and gabapentin with mild relief of symptoms.  She has been seen multiple times for the same complaint including on 12/08/2021, 11/23/2021, 11/12/2021  Review of Systems  Positive: See above Negative:   Physical Exam  BP 122/71 (BP Location: Left Arm)    Pulse 85    Temp 98.3 F (36.8 C) (Oral)    Resp 18    Ht 5' 4.5" (1.638 m)    Wt 59 kg    SpO2 100%    BMI 21.97 kg/m  Gen:   Awake, no distress   Resp:  Normal effort  MSK:   Moves extremities without difficulty.  Strength 5/5 in bilateral upper and lower extremities.  Pulses 2+ bilateral radial.  There is no swelling noted to either upper extremity.  There is no bruising or injuries noted. Other:    Medical Decision Making  Medically screening exam initiated at 5:17 PM.  Appropriate orders placed.  Damoni Erker was informed that the remainder of the evaluation will be completed by another provider, this initial triage assessment does not replace that evaluation, and the importance of remaining in the ED until their evaluation is complete.     Cristopher Peru, PA-C 12/15/21 1720    Cheryll Cockayne, MD 12/27/21 2004

## 2021-12-15 NOTE — Discharge Instructions (Addendum)
Please continue to take Tylenol as well as your prescribed gabapentin.  Please do not exceed more than 3000 mg of Tylenol per day.  Below is the contact information for Dr. Ophelia Charter.  He is a Haematologist.  Please give them a call and schedule an appointment for reevaluation of your arm pain.  Please return to the emergency department with any new or worsening symptoms.  It was a pleasure to meet you.

## 2021-12-24 ENCOUNTER — Emergency Department (HOSPITAL_COMMUNITY)
Admission: EM | Admit: 2021-12-24 | Discharge: 2021-12-24 | Discharge status: Against Medical Advice | Payer: Medicare (Managed Care) | Attending: Emergency Medicine | Admitting: Emergency Medicine

## 2021-12-24 ENCOUNTER — Encounter (HOSPITAL_COMMUNITY): Payer: Self-pay | Admitting: Emergency Medicine

## 2021-12-24 DIAGNOSIS — Z5321 Procedure and treatment not carried out due to patient leaving prior to being seen by health care provider: Secondary | ICD-10-CM | POA: Insufficient documentation

## 2021-12-24 DIAGNOSIS — R739 Hyperglycemia, unspecified: Secondary | ICD-10-CM | POA: Insufficient documentation

## 2021-12-24 DIAGNOSIS — R232 Flushing: Secondary | ICD-10-CM | POA: Diagnosis not present

## 2021-12-24 LAB — CBC WITH DIFFERENTIAL/PLATELET
Abs Immature Granulocytes: 0.02 10*3/uL (ref 0.00–0.07)
Basophils Absolute: 0 10*3/uL (ref 0.0–0.1)
Basophils Relative: 0 %
Eosinophils Absolute: 0 10*3/uL (ref 0.0–0.5)
Eosinophils Relative: 0 %
HCT: 41.7 % (ref 36.0–46.0)
Hemoglobin: 13.5 g/dL (ref 12.0–15.0)
Immature Granulocytes: 0 %
Lymphocytes Relative: 30 %
Lymphs Abs: 2.2 10*3/uL (ref 0.7–4.0)
MCH: 31.8 pg (ref 26.0–34.0)
MCHC: 32.4 g/dL (ref 30.0–36.0)
MCV: 98.1 fL (ref 80.0–100.0)
Monocytes Absolute: 0.6 10*3/uL (ref 0.1–1.0)
Monocytes Relative: 9 %
Neutro Abs: 4.4 10*3/uL (ref 1.7–7.7)
Neutrophils Relative %: 61 %
Platelets: 322 10*3/uL (ref 150–400)
RBC: 4.25 MIL/uL (ref 3.87–5.11)
RDW: 11.8 % (ref 11.5–15.5)
WBC: 7.3 10*3/uL (ref 4.0–10.5)
nRBC: 0 % (ref 0.0–0.2)

## 2021-12-24 LAB — BASIC METABOLIC PANEL
Anion gap: 10 (ref 5–15)
BUN: 13 mg/dL (ref 8–23)
CO2: 24 mmol/L (ref 22–32)
Calcium: 9.3 mg/dL (ref 8.9–10.3)
Chloride: 98 mmol/L (ref 98–111)
Creatinine, Ser: 0.88 mg/dL (ref 0.44–1.00)
GFR, Estimated: >60 mL/min (ref >60)
Glucose, Bld: 534 mg/dL (ref 70–99)
Potassium: 4 mmol/L (ref 3.5–5.1)
Sodium: 132 mmol/L — ABNORMAL LOW (ref 135–145)

## 2021-12-24 NOTE — ED Notes (Signed)
Lab called, glucose 534. PA in triage made aware.

## 2021-12-24 NOTE — ED Provider Triage Note (Signed)
Emergency Medicine Provider Triage Evaluation Note  Jacqueline Ball , a 81 y.o. female  was evaluated in triage.  Pt complains of "hot flashes" over the last several days. Denies other symptoms. Denies vaginal bleeding, dysuria, hx of cancer. Endorses increased frequency. No other symptoms. Many frequent ED visits.  Review of Systems  Positive: As above  Negative: As above  Physical Exam  BP 125/73 (BP Location: Right Arm)    Pulse 91    Temp 98 F (36.7 C) (Oral)    Resp 17    SpO2 96%  Gen:   Awake, no distress   Resp:  Normal effort  MSK:   Moves extremities without difficulty  Other:  No ttp abdomen  Medical Decision Making  Medically screening exam initiated at 6:35 PM.  Appropriate orders placed.  Jacqueline Ball was informed that the remainder of the evaluation will be completed by another provider, this initial triage assessment does not replace that evaluation, and the importance of remaining in the ED until their evaluation is complete.  Workup initiated   Jacqueline Ball 12/24/21 [id number]

## 2021-12-24 NOTE — ED Notes (Signed)
Patient decided that she could not wait any longer and that she was going to go to her pcp in the am

## 2021-12-24 NOTE — ED Triage Notes (Signed)
Pt reports intermittent hot flashes for a month. States she has been seen at other hospitals for same. Also still having pain under left arm from recent abscess.

## 2021-12-27 ENCOUNTER — Ambulatory Visit (HOSPITAL_COMMUNITY)
Admission: EM | Admit: 2021-12-27 | Discharge: 2021-12-27 | Disposition: A | Discharge status: Home / Self Care | Payer: Medicare (Managed Care) | Attending: Internal Medicine | Admitting: Internal Medicine

## 2021-12-27 ENCOUNTER — Encounter (HOSPITAL_COMMUNITY): Payer: Self-pay | Admitting: Emergency Medicine

## 2021-12-27 ENCOUNTER — Other Ambulatory Visit: Payer: Self-pay

## 2021-12-27 DIAGNOSIS — N898 Other specified noninflammatory disorders of vagina: Secondary | ICD-10-CM | POA: Insufficient documentation

## 2021-12-27 DIAGNOSIS — L02412 Cutaneous abscess of left axilla: Secondary | ICD-10-CM

## 2021-12-27 DIAGNOSIS — L0291 Cutaneous abscess, unspecified: Secondary | ICD-10-CM | POA: Diagnosis not present

## 2021-12-27 NOTE — ED Triage Notes (Signed)
Pt reports that she had knots under her left arm for month and small one under right axilla. Reports painful and "moving different places like my arms, legs and now my neck". Reports was seen for knots and told they would be alright.  Pt adds that she needs some thing for a yeast infection.

## 2021-12-27 NOTE — Discharge Instructions (Addendum)
As we discussed, continue your antibiotic.  I recommend warm compresses in your armpit to help your abscess drain.  Please schedule an appointment with your doctor within 1 week to ensure that you have follow-up if this does not improve.  We will test her vaginal discharge and contact you tomorrow if we need to treat you for an infection.  If you develop significant worsening of pain, especially if there is worsening redness, you develop fevers, you should be seen by medical provider right away.

## 2021-12-27 NOTE — ED Provider Notes (Signed)
[facility]    CSN: [id number] Arrival date & time: 12/27/21  1015      History   Chief Complaint Chief Complaint  Patient presents with   Mass    HPI Jacqueline Ball is a 81 y.o. female.   Left Axilla pain Given an Rx for Keflex from ED on 1/6 Still on antibiotic Wants to know why she is having pain if she started the antibiotic Worried that it is spreading to her right axilla Wants to know what else to do for it No fevers Otherwise feeling well   Vaginal Discharge Discharge started a few weeks ago Discharge appears "different" She denies vaginal odors She denies vaginal pruritis, abnormal vaginal bleeding, dysuria, hematuria, pelvic pain, nausea, vomiting, fevers No LMP recorded. Patient has had a hysterectomy.      Past Medical History:  Diagnosis Date   Anxiety    Atrial fibrillation ([place])    Diabetes mellitus    Hyperlipidemia    Hypertension    Memory loss    Mood disorder [facility])    Physical exam, annual 10/22/06   Renal cyst     Patient Active Problem List   Diagnosis Date Noted   Acute midline low back pain without sciatica 11/29/2019   GERD (gastroesophageal reflux disease) 05/12/2019   Arthritis 05/12/2019   Dementia ([place]) 05/12/2019   Hyperlipidemia 05/12/2019   Overactive bladder 05/12/2019   Chronic anticoagulation 03/18/2018   Late onset Alzheimer's disease without behavioral disturbance ([place]) 01/21/2017   Chronic atrial fibrillation ([place]) 11/02/2015   Essential hypertension 11/02/2015   Type 2 diabetes mellitus without complication, without long-term current use of insulin ([place]) 11/02/2015   Cognitive impairment 10/31/2015   Chest pain 11/22/2014   Muscle cramps 04/17/2012   Bilateral shoulder pain 03/09/2012   Essential hypertension 03/28/2011   Diabetes mellitus type 2, noninsulin dependent ([place]) 03/28/2011   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      Home Medications    Prior to Admission medications   Medication Sig Start Date End Date Taking? Authorizing Provider  ACCU-CHEK GUIDE test strip USE AS DIRECTED UP TO THREE TIMES DAILY 01/16/21   [provider]  acetaminophen (TYLENOL) 500 MG tablet Take 500 mg by mouth every 6 (six) hours as needed for pain.    [provider]  apixaban (ELIQUIS) 5 MG TABS tablet Take 1 tablet (5 mg total) by mouth 2 (two) times daily. 11/24/14   Charolette Forward, MD  atenolol (TENORMIN) 50 MG tablet Take 50 mg by mouth daily. 07/29/19   [provider]  atorvastatin (LIPITOR) 40 MG tablet Take 40 mg by mouth at bedtime.    [provider]  azelastine (ASTELIN) 0.1 % nasal spray Place 1 spray into both nostrils as needed for rhinitis or allergies.  05/24/18   [provider]  Blood Glucose Monitoring Suppl (ACCU-CHEK GUIDE) w/Device KIT USE AS DIRECTED UP TO THREE TIMES DAILY 01/16/21   [provider]  cyclobenzaprine (FLEXERIL) 5 MG tablet Take 1-2 tablets (5-10 mg total) by mouth at bedtime. 06/06/20   Wieters, Hallie C, PA-C  diclofenac Sodium (VOLTAREN) 1 % GEL Apply one application (2g) up to four times daily as needed for pain. 12/08/21   Crain, Whitney L, PA  dicyclomine (BENTYL) 20 MG tablet Take 20 mg by mouth 3 (three) times  daily as needed for spasms. 08/29/18   [provider]  donepezil (ARICEPT) 10 MG tablet Take 1 tablet (10 mg total) by mouth at bedtime. 06/06/17   Rosalin Hawking, MD  esomeprazole (NEXIUM) 40 MG capsule Take 40 mg by mouth daily at 12 noon.    [provider]  estradiol (CLIMARA) 0.06 MG/24HR Place 0.06 patches onto the skin once a week. 08/12/17   [provider]  fluconazole (DIFLUCAN) 150 MG tablet fluconazole 150 mg tablet    [provider]  fluticasone (FLONASE) 50 MCG/ACT nasal spray Place 1 spray into both nostrils daily.  09/15/18   Bast, Tressia Miners A, NP  glimepiride (AMARYL) 4 MG tablet Take 4 mg by mouth daily with breakfast.    [provider]  losartan (COZAAR) 25 MG tablet Take 25 mg by mouth daily. 05/03/20   [provider]  metFORMIN (GLUCOPHAGE) 500 MG tablet Take 500 mg by mouth 2 (two) times daily with a meal.  08/23/15   [provider]  MYRBETRIQ 50 MG TB24 tablet Take 50 mg by mouth daily. 07/29/19   [provider]  nystatin cream (MYCOSTATIN) Apply to affected area 2 times daily 04/02/20   Loura Halt A, NP  omeprazole (PRILOSEC) 40 MG capsule Take 40 mg by mouth daily. 11/27/19   [provider]  pantoprazole (PROTONIX) 20 MG tablet Take 1 tablet (20 mg total) by mouth daily. 02/24/20   Marney Setting, NP  rosuvastatin (CRESTOR) 10 MG tablet Take 10 mg by mouth at bedtime. 09/14/19   [provider]  sulfamethoxazole-trimethoprim (BACTRIM DS) 800-160 MG tablet Take 1 tablet by mouth 2 (two) times daily. 03/12/20   Jaynee Eagles, PA-C  traMADol (ULTRAM) 50 MG tablet Take 1 tablet (50 mg total) by mouth 2 (two) times daily as needed for severe pain. for pain 11/23/21   Antonietta Breach, PA-C  VESICARE 10 MG tablet Take 10 mg by mouth daily.  01/17/18   [provider]    Family History Family History  Problem Relation Age of Onset   Diabetes Sister     Social History Social History   Tobacco Use   Smoking status: Never   Smokeless tobacco: Never  Vaping Use   Vaping Use: Never used  Substance Use Topics   Alcohol use: No   Drug use: No     Allergies   Metoprolol   Review of Systems Review of Systems  All other systems reviewed and are negative.  Per HPI Physical Exam Triage Vital Signs ED Triage Vitals [12/27/21 1105]  Enc Vitals Group     BP      Pulse      Resp      Temp      Temp src      SpO2      Weight      Height      Head Circumference      Peak Flow      Pain Score 10     Pain Loc      Pain Edu?      Excl.  in [place]?    No data found.  Updated Vital Signs BP 127/85 (BP Location: Right Arm)    Pulse 69    Temp 98.5 F (36.9 C) (Oral)    Resp 18    SpO2 97%   Visual Acuity Right Eye Distance:   Left Eye Distance:   Bilateral Distance:    Right Eye Near:  Left Eye Near:    Bilateral Near:     Physical Exam Constitutional:      General: She is not in acute distress.    Appearance: Normal appearance. She is not ill-appearing.  HENT:     Head: Normocephalic and atraumatic.  Cardiovascular:     Rate and Rhythm: Normal rate.  Pulmonary:     [place]: Pulmonary effort is normal. No respiratory distress.  Musculoskeletal:        General: No swelling.  Skin:    General: Skin is warm and dry.     Comments: Right axilla without palpable abscess or LAD Left axilla with 1cm circular indurated nodule without fluctuance noted and no surrounding erythema  Neurological:     Mental Status: She is alert and oriented to person, place, and time.     Comments: Struggles to recall recent events however Slightly tangential     UC Treatments / Results  Labs (all labs ordered are listed, but only abnormal results are displayed) Labs Reviewed  CERVICOVAGINAL ANCILLARY ONLY    EKG   Radiology No results found.  Procedures Procedures (including critical care time)  Medications Ordered in UC Medications - No data to display  Initial Impression / Assessment and Plan / UC Course  I have reviewed the triage vital signs and the nursing notes.  Pertinent labs & imaging results that were available during my care of the patient were reviewed by me and considered in my medical decision making (see chart for details).     Continue to suspect that patient would not benefit from I&D given lack of fluctuance.  Recommend warm compresses and complete course of Keflex previously prescribed.  Recommend follow-up with primary care provider within the next week to ensure that this improves and would  recommend a referral to surgery if it does not at that time.  Vaginal swab performed, she denies abnormal uterine bleeding.  We will treat based off of vaginal swab results.  Given return precautions, see AVS.   Final Clinical Impressions(s) / UC Diagnoses   Final diagnoses:  Abscess  Vaginal discharge     Discharge Instructions      As we discussed, continue your antibiotic.  I recommend warm compresses in your armpit to help your abscess drain.  Please schedule an appointment with your doctor within 1 week to ensure that you have follow-up if this does not improve.  We will test her vaginal discharge and contact you tomorrow if we need to treat you for an infection.  If you develop significant worsening of pain, especially if there is worsening redness, you develop fevers, you should be seen by medical provider right away.     ED Prescriptions   None    PDMP not reviewed this encounter.   [place], DO 12/27/21 1125

## 2021-12-28 LAB — CERVICOVAGINAL ANCILLARY ONLY
Bacterial Vaginitis (gardnerella): POSITIVE — AB
Candida Glabrata: NEGATIVE
Candida Vaginitis: POSITIVE — AB
Chlamydia: NEGATIVE
Comment: NEGATIVE
Comment: NEGATIVE
Comment: NEGATIVE
Comment: NEGATIVE
Comment: NEGATIVE
Comment: NORMAL
Neisseria Gonorrhea: NEGATIVE
Trichomonas: NEGATIVE

## 2022-01-21 ENCOUNTER — Other Ambulatory Visit: Payer: Self-pay

## 2022-01-21 ENCOUNTER — Encounter (HOSPITAL_COMMUNITY): Payer: Self-pay | Admitting: Emergency Medicine

## 2022-01-21 ENCOUNTER — Emergency Department (HOSPITAL_COMMUNITY)
Admission: EM | Admit: 2022-01-21 | Discharge: 2022-01-22 | Disposition: A | Discharge status: Home / Self Care | Payer: No Typology Code available for payment source | Attending: Emergency Medicine | Admitting: Emergency Medicine

## 2022-01-21 DIAGNOSIS — M79606 Pain in leg, unspecified: Secondary | ICD-10-CM | POA: Diagnosis not present

## 2022-01-21 DIAGNOSIS — B379 Candidiasis, unspecified: Secondary | ICD-10-CM | POA: Diagnosis not present

## 2022-01-21 DIAGNOSIS — M79601 Pain in right arm: Secondary | ICD-10-CM | POA: Diagnosis not present

## 2022-01-21 DIAGNOSIS — M79602 Pain in left arm: Secondary | ICD-10-CM | POA: Insufficient documentation

## 2022-01-21 DIAGNOSIS — E1165 Type 2 diabetes mellitus with hyperglycemia: Secondary | ICD-10-CM | POA: Diagnosis not present

## 2022-01-21 DIAGNOSIS — M79609 Pain in unspecified limb: Secondary | ICD-10-CM

## 2022-01-21 DIAGNOSIS — Z7901 Long term (current) use of anticoagulants: Secondary | ICD-10-CM | POA: Insufficient documentation

## 2022-01-21 DIAGNOSIS — R739 Hyperglycemia, unspecified: Secondary | ICD-10-CM

## 2022-01-21 DIAGNOSIS — Z7984 Long term (current) use of oral hypoglycemic drugs: Secondary | ICD-10-CM | POA: Insufficient documentation

## 2022-01-21 DIAGNOSIS — R232 Flushing: Secondary | ICD-10-CM | POA: Insufficient documentation

## 2022-01-21 LAB — I-STAT VENOUS BLOOD GAS, ED
Acid-Base Excess: 3 mmol/L — ABNORMAL HIGH (ref 0.0–2.0)
Bicarbonate: 29.9 mmol/L — ABNORMAL HIGH (ref 20.0–28.0)
Calcium, Ion: 1.23 mmol/L (ref 1.15–1.40)
HCT: 44 % (ref 36.0–46.0)
Hemoglobin: 15 g/dL (ref 12.0–15.0)
O2 Saturation: 97 %
Potassium: 4.1 mmol/L (ref 3.5–5.1)
Sodium: 132 mmol/L — ABNORMAL LOW (ref 135–145)
TCO2: 31 mmol/L (ref 22–32)
pCO2, Ven: 51.8 mmHg (ref 44.0–60.0)
pH, Ven: 7.369 (ref 7.250–7.430)
pO2, Ven: 96 mmHg — ABNORMAL HIGH (ref 32.0–45.0)

## 2022-01-21 LAB — CBC WITH DIFFERENTIAL/PLATELET
Abs Immature Granulocytes: 0.02 10*3/uL (ref 0.00–0.07)
Basophils Absolute: 0 10*3/uL (ref 0.0–0.1)
Basophils Relative: 0 %
Eosinophils Absolute: 0 10*3/uL (ref 0.0–0.5)
Eosinophils Relative: 0 %
HCT: 41.8 % (ref 36.0–46.0)
Hemoglobin: 14.4 g/dL (ref 12.0–15.0)
Immature Granulocytes: 0 %
Lymphocytes Relative: 26 %
Lymphs Abs: 2 10*3/uL (ref 0.7–4.0)
MCH: 32.1 pg (ref 26.0–34.0)
MCHC: 34.4 g/dL (ref 30.0–36.0)
MCV: 93.3 fL (ref 80.0–100.0)
Monocytes Absolute: 0.6 10*3/uL (ref 0.1–1.0)
Monocytes Relative: 8 %
Neutro Abs: 5.3 10*3/uL (ref 1.7–7.7)
Neutrophils Relative %: 66 %
Platelets: 342 10*3/uL (ref 150–400)
RBC: 4.48 MIL/uL (ref 3.87–5.11)
RDW: 11.6 % (ref 11.5–15.5)
WBC: 7.9 10*3/uL (ref 4.0–10.5)
nRBC: 0 % (ref 0.0–0.2)

## 2022-01-21 LAB — CBG MONITORING, ED
Glucose-Capillary: >600 mg/dL (ref 70–99)
Glucose-Capillary: 234 mg/dL — ABNORMAL HIGH (ref 70–99)

## 2022-01-21 LAB — COMPREHENSIVE METABOLIC PANEL
ALT: 22 U/L (ref 0–44)
AST: 19 U/L (ref 15–41)
Albumin: 4 g/dL (ref 3.5–5.0)
Alkaline Phosphatase: 134 U/L — ABNORMAL HIGH (ref 38–126)
Anion gap: 11 (ref 5–15)
BUN: 11 mg/dL (ref 8–23)
CO2: 26 mmol/L (ref 22–32)
Calcium: 9.7 mg/dL (ref 8.9–10.3)
Chloride: 94 mmol/L — ABNORMAL LOW (ref 98–111)
Creatinine, Ser: 1.06 mg/dL — ABNORMAL HIGH (ref 0.44–1.00)
GFR, Estimated: 53 mL/min — ABNORMAL LOW (ref >60)
Glucose, Bld: 590 mg/dL (ref 70–99)
Potassium: 4.2 mmol/L (ref 3.5–5.1)
Sodium: 131 mmol/L — ABNORMAL LOW (ref 135–145)
Total Bilirubin: 0.9 mg/dL (ref 0.3–1.2)
Total Protein: 7.3 g/dL (ref 6.5–8.1)

## 2022-01-21 MED ORDER — INSULIN ASPART 100 UNIT/ML IJ SOLN
8.0000 [IU] | Freq: Once | INTRAMUSCULAR | Status: AC
  Administered 2022-01-21: 8 [IU] via INTRAVENOUS

## 2022-01-21 MED ORDER — SODIUM CHLORIDE 0.9 % IV BOLUS
1000.0000 mL | Freq: Once | INTRAVENOUS | Status: AC
  Administered 2022-01-21: 1000 mL via INTRAVENOUS

## 2022-01-21 NOTE — ED Triage Notes (Signed)
Patient c/o bilateral arm and back pain ongoing for a long time. States worse when lifting her arms.

## 2022-01-21 NOTE — Discharge Instructions (Signed)
Follow-up with Dr. Marlou Sa.  You may need adjustments of your diabetes medicine and better control of your extremity pain

## 2022-01-21 NOTE — ED Notes (Signed)
Someone took pt to the water fountain to get water.

## 2022-01-22 ENCOUNTER — Emergency Department (HOSPITAL_COMMUNITY)
Admission: EM | Admit: 2022-01-22 | Discharge: 2022-01-22 | Disposition: A | Discharge status: Home / Self Care | Payer: No Typology Code available for payment source | Source: Home / Self Care

## 2022-01-22 ENCOUNTER — Encounter (HOSPITAL_COMMUNITY): Payer: Self-pay

## 2022-01-22 ENCOUNTER — Emergency Department (HOSPITAL_COMMUNITY)
Admission: EM | Admit: 2022-01-22 | Discharge: 2022-01-22 | Disposition: A | Discharge status: Home / Self Care | Payer: No Typology Code available for payment source

## 2022-01-22 DIAGNOSIS — Z5321 Procedure and treatment not carried out due to patient leaving prior to being seen by health care provider: Secondary | ICD-10-CM | POA: Insufficient documentation

## 2022-01-22 DIAGNOSIS — B379 Candidiasis, unspecified: Secondary | ICD-10-CM | POA: Insufficient documentation

## 2022-01-22 DIAGNOSIS — R232 Flushing: Secondary | ICD-10-CM | POA: Insufficient documentation

## 2022-01-22 NOTE — ED Notes (Signed)
Pt said she ws going to have to leave cuz she's been waiting too long

## 2022-01-22 NOTE — ED Notes (Signed)
No response x3 

## 2022-01-22 NOTE — ED Triage Notes (Signed)
Pt arrives POV for eval of yeast infection so bad it "knocks her out". Pt also reports complaints of hot flashes.

## 2022-01-22 NOTE — ED Notes (Signed)
Called patient for vitals patient didn't answer 

## 2022-01-23 NOTE — ED Provider Notes (Signed)
[facility] EMERGENCY DEPARTMENT Provider Note   CSN: [id number] Arrival date & time: 01/21/22  1454     History  Chief Complaint  Patient presents with   Arm Pain    Jacqueline Ball is a 81 y.o. female.   Arm Pain Pertinent negatives include no abdominal pain. Patient presents with bilateral arm pain and leg pain.  Has been going for months or longer.  Has been seen by PCP and visits to the ER for the same.  States is similar pain.  States she had been on medicines for it.  States she had been on Neurontin.  States she is due to see Dr. Marlou Sa but has not seen him in a while.  No trauma.  No neck pain.  No swelling.     Home Medications Prior to Admission medications   Medication Sig Start Date End Date Taking? Authorizing Provider  ACCU-CHEK GUIDE test strip USE AS DIRECTED UP TO THREE TIMES DAILY 01/16/21   [provider]  acetaminophen (TYLENOL) 500 MG tablet Take 500 mg by mouth every 6 (six) hours as needed for pain.    [provider]  apixaban (ELIQUIS) 5 MG TABS tablet Take 1 tablet (5 mg total) by mouth 2 (two) times daily. 11/24/14   Charolette Forward, MD  atenolol (TENORMIN) 50 MG tablet Take 50 mg by mouth daily. 07/29/19   [provider]  atorvastatin (LIPITOR) 40 MG tablet Take 40 mg by mouth at bedtime.    [provider]  azelastine (ASTELIN) 0.1 % nasal spray Place 1 spray into both nostrils as needed for rhinitis or allergies.  05/24/18   [provider]  Blood Glucose Monitoring Suppl (ACCU-CHEK GUIDE) w/Device KIT USE AS DIRECTED UP TO THREE TIMES DAILY 01/16/21   [provider]  cyclobenzaprine (FLEXERIL) 5 MG tablet Take 1-2 tablets (5-10 mg total) by mouth at bedtime. 06/06/20   Wieters, Hallie C, PA-C  diclofenac Sodium (VOLTAREN) 1 % GEL Apply one application (2g) up to four times daily as needed for pain. 12/08/21   Crain, Whitney L, PA  dicyclomine (BENTYL) 20 MG tablet Take 20 mg by mouth 3  (three) times daily as needed for spasms. 08/29/18   [provider]  donepezil (ARICEPT) 10 MG tablet Take 1 tablet (10 mg total) by mouth at bedtime. 06/06/17   Rosalin Hawking, MD  esomeprazole (NEXIUM) 40 MG capsule Take 40 mg by mouth daily at 12 noon.    [provider]  estradiol (CLIMARA) 0.06 MG/24HR Place 0.06 patches onto the skin once a week. 08/12/17   [provider]  fluconazole (DIFLUCAN) 150 MG tablet fluconazole 150 mg tablet    [provider]  fluticasone (FLONASE) 50 MCG/ACT nasal spray Place 1 spray into both nostrils daily. 09/15/18   Bast, Tressia Miners A, NP  glimepiride (AMARYL) 4 MG tablet Take 4 mg by mouth daily with breakfast.    [provider]  losartan (COZAAR) 25 MG tablet Take 25 mg by mouth daily. 05/03/20   [provider]  metFORMIN (GLUCOPHAGE) 500 MG tablet Take 500 mg by mouth 2 (two) times daily with a meal.  08/23/15   [provider]  MYRBETRIQ 50 MG TB24 tablet Take 50 mg by mouth daily. 07/29/19   [provider]  nystatin cream (MYCOSTATIN) Apply to affected area 2 times daily 04/02/20   Loura Halt A, NP  omeprazole (PRILOSEC) 40 MG capsule Take 40 mg by mouth daily. 11/27/19  [provider]  pantoprazole (PROTONIX) 20 MG tablet Take 1 tablet (20 mg total) by mouth daily. 02/24/20   Marney Setting, NP  rosuvastatin (CRESTOR) 10 MG tablet Take 10 mg by mouth at bedtime. 09/14/19   [provider]  sulfamethoxazole-trimethoprim (BACTRIM DS) 800-160 MG tablet Take 1 tablet by mouth 2 (two) times daily. 03/12/20   Jaynee Eagles, PA-C  traMADol (ULTRAM) 50 MG tablet Take 1 tablet (50 mg total) by mouth 2 (two) times daily as needed for severe pain. for pain 11/23/21   Antonietta Breach, PA-C  VESICARE 10 MG tablet Take 10 mg by mouth daily.  01/17/18   [provider]      Allergies    Metoprolol    Review of Systems   Review of Systems  Constitutional:  Negative for appetite  change.  Gastrointestinal:  Negative for abdominal pain.  Endocrine: Negative for polyuria.  Musculoskeletal:  Negative for back pain.  Neurological:  Negative for weakness and numbness.   Physical Exam Updated Vital Signs BP (!) 134/92 (BP Location: Left Arm)    Pulse 72    Temp 98.3 F (36.8 C) (Oral)    Resp 18    SpO2 96%  Physical Exam Vitals and nursing note reviewed.  Constitutional:      Appearance: Normal appearance.  Cardiovascular:     Rate and Rhythm: Regular rhythm.  Musculoskeletal:        General: No tenderness.     Cervical back: Neck supple.  Skin:    General: Skin is warm.     Capillary Refill: Capillary refill takes less than 2 seconds.  Neurological:     Mental Status: She is alert and oriented to person, place, and time.     Comments: Awake and appropriate but may have some mild confusion.  I wonder if there is some underlying early dementia.    ED Results / Procedures / Treatments   Labs (all labs ordered are listed, but only abnormal results are displayed) Labs Reviewed  COMPREHENSIVE METABOLIC PANEL - Abnormal; Notable for the following components:      Result Value   Sodium 131 (*)    Chloride 94 (*)    Glucose, Bld 590 (*)    Creatinine, Ser 1.06 (*)    Alkaline Phosphatase 134 (*)    GFR, Estimated 53 (*)    All other components within normal limits  CBG MONITORING, ED - Abnormal; Notable for the following components:   Glucose-Capillary >600 (*)    All other components within normal limits  I-STAT VENOUS BLOOD GAS, ED - Abnormal; Notable for the following components:   pO2, Ven 96.0 (*)    Bicarbonate 29.9 (*)    Acid-Base Excess 3.0 (*)    Sodium 132 (*)    All other components within normal limits  CBG MONITORING, ED - Abnormal; Notable for the following components:   Glucose-Capillary 234 (*)    All other components within normal limits  CBC WITH DIFFERENTIAL/PLATELET    EKG None  Radiology No results  found.  Procedures Procedures    Medications Ordered in ED Medications  sodium chloride 0.9 % bolus 1,000 mL (0 mLs Intravenous Stopped 01/21/22 2313)  insulin aspart (novoLOG) injection 8 Units (8 Units Intravenous Given 01/21/22 2123)    ED Course/ Medical Decision Making/ A&P                           Medical Decision  Making Problems Addressed: Hyperglycemia: acute illness or injury that poses a threat to life or bodily functions  Amount and/or Complexity of Data Reviewed External Data Reviewed: notes.    Details: Previous ER visits and external hospital ER visits. Labs: ordered.  Risk Prescription drug management. Decision regarding hospitalization.  Initial differential diagnosis for pain in arms and legs as long.  Includes neuropathy, electrolyte abnormality. Patient with chronic pains in arms and legs.  History of same.  Has been previously worked up without a clear cause.  Has had some thought of neuropathy.  Doubt cervical or thoracic spine issue.  Is diabetic and has had been hyperglycemic in the past.  Point-of-care glucose was elevated.  Lab work been ordered and showed hyperglycemia without DKA.  Improved with IV fluids and insulin.  Feeling better and eager to go home.  Has short-term follow-up with Dr. Marlou Sa.        Final Clinical Impression(s) / ED Diagnoses Final diagnoses:  Hyperglycemia  Pain in extremity, unspecified extremity    Rx / DC Orders ED Discharge Orders     None         Davonna Belling, MD 01/23/22 1541

## 2022-01-29 ENCOUNTER — Other Ambulatory Visit: Payer: Self-pay

## 2022-01-29 ENCOUNTER — Emergency Department (HOSPITAL_COMMUNITY)
Admission: EM | Admit: 2022-01-29 | Discharge: 2022-01-29 | Disposition: A | Discharge status: Home / Self Care | Payer: No Typology Code available for payment source | Attending: Emergency Medicine | Admitting: Emergency Medicine

## 2022-01-29 ENCOUNTER — Encounter (HOSPITAL_COMMUNITY): Payer: Self-pay

## 2022-01-29 ENCOUNTER — Emergency Department (HOSPITAL_COMMUNITY): Payer: No Typology Code available for payment source

## 2022-01-29 DIAGNOSIS — I10 Essential (primary) hypertension: Secondary | ICD-10-CM | POA: Diagnosis not present

## 2022-01-29 DIAGNOSIS — Z7984 Long term (current) use of oral hypoglycemic drugs: Secondary | ICD-10-CM | POA: Insufficient documentation

## 2022-01-29 DIAGNOSIS — E119 Type 2 diabetes mellitus without complications: Secondary | ICD-10-CM | POA: Diagnosis not present

## 2022-01-29 DIAGNOSIS — M25512 Pain in left shoulder: Secondary | ICD-10-CM | POA: Diagnosis not present

## 2022-01-29 DIAGNOSIS — Z7901 Long term (current) use of anticoagulants: Secondary | ICD-10-CM | POA: Insufficient documentation

## 2022-01-29 DIAGNOSIS — M25511 Pain in right shoulder: Secondary | ICD-10-CM | POA: Insufficient documentation

## 2022-01-29 DIAGNOSIS — Z79899 Other long term (current) drug therapy: Secondary | ICD-10-CM | POA: Insufficient documentation

## 2022-01-29 NOTE — Discharge Instructions (Signed)
Continue taking Tylenol and gabapentin to help with shoulder pain.  Your x-rays today are unremarkable.  You will need to follow-up with orthopedics for further evaluation.  Please call the clinic listed below to schedule follow-up.  [facility] [address] [place] [address]  [phone]

## 2022-01-29 NOTE — ED Provider Notes (Signed)
[facility] DEPT Provider Note   CSN: [id number] Arrival date & time: 01/29/22  1735     History  Chief Complaint  Patient presents with   Shoulder Pain    Jacqueline Ball is a 81 y.o. female.  Jacqueline Ball is a 81 y.o. female with underlying history of hypertension, diabetes, hyperlipidemia, A-fib and memory loss, who presents to the ED for evaluation of bilateral shoulder pain.  Patient has had multiple visits to the ED for similar symptoms.  She reports this has been ongoing for several months.  She denies any trauma or injury to the shoulders.  Reports bilateral aching pain in the shoulders that sometimes radiates down her arms.  She denies numbness, tingling or weakness in the shoulders or arms.  No associated chest pain or shortness of breath.  She has been taking Tylenol and gabapentin as prescribed which she does report has provided some pain relief.  She has not followed up with orthopedics regarding the symptoms.  The history is provided by the patient and medical records.      Home Medications Prior to Admission medications   Medication Sig Start Date End Date Taking? Authorizing Provider  ACCU-CHEK GUIDE test strip USE AS DIRECTED UP TO THREE TIMES DAILY 01/16/21   [provider]  acetaminophen (TYLENOL) 500 MG tablet Take 500 mg by mouth every 6 (six) hours as needed for pain.    [provider]  apixaban (ELIQUIS) 5 MG TABS tablet Take 1 tablet (5 mg total) by mouth 2 (two) times daily. 11/24/14   Charolette Forward, MD  atenolol (TENORMIN) 50 MG tablet Take 50 mg by mouth daily. 07/29/19   [provider]  atorvastatin (LIPITOR) 40 MG tablet Take 40 mg by mouth at bedtime.    [provider]  azelastine (ASTELIN) 0.1 % nasal spray Place 1 spray into both nostrils as needed for rhinitis or allergies.  05/24/18   [provider]  Blood Glucose Monitoring Suppl (ACCU-CHEK GUIDE) w/Device KIT USE AS DIRECTED UP  TO THREE TIMES DAILY 01/16/21   [provider]  cyclobenzaprine (FLEXERIL) 5 MG tablet Take 1-2 tablets (5-10 mg total) by mouth at bedtime. 06/06/20   Wieters, Hallie C, PA-C  diclofenac Sodium (VOLTAREN) 1 % GEL Apply one application (2g) up to four times daily as needed for pain. 12/08/21   Crain, Whitney L, PA  dicyclomine (BENTYL) 20 MG tablet Take 20 mg by mouth 3 (three) times daily as needed for spasms. 08/29/18   [provider]  donepezil (ARICEPT) 10 MG tablet Take 1 tablet (10 mg total) by mouth at bedtime. 06/06/17   Rosalin Hawking, MD  esomeprazole (NEXIUM) 40 MG capsule Take 40 mg by mouth daily at 12 noon.    [provider]  estradiol (CLIMARA) 0.06 MG/24HR Place 0.06 patches onto the skin once a week. 08/12/17   [provider]  fluconazole (DIFLUCAN) 150 MG tablet fluconazole 150 mg tablet    [provider]  fluticasone (FLONASE) 50 MCG/ACT nasal spray Place 1 spray into both nostrils daily. 09/15/18   Bast, Tressia Miners A, NP  glimepiride (AMARYL) 4 MG tablet Take 4 mg by mouth daily with breakfast.    [provider]  losartan (COZAAR) 25 MG tablet Take 25 mg by mouth daily. 05/03/20   [provider]  metFORMIN (GLUCOPHAGE) 500 MG tablet Take 500 mg by mouth 2 (two) times daily with a meal.  08/23/15   [provider]  [facility]  50 MG TB24 tablet Take 50 mg by mouth daily. 07/29/19   [provider]  nystatin cream (MYCOSTATIN) Apply to affected area 2 times daily 04/02/20   Loura Halt A, NP  omeprazole (PRILOSEC) 40 MG capsule Take 40 mg by mouth daily. 11/27/19   [provider]  pantoprazole (PROTONIX) 20 MG tablet Take 1 tablet (20 mg total) by mouth daily. 02/24/20   Marney Setting, NP  rosuvastatin (CRESTOR) 10 MG tablet Take 10 mg by mouth at bedtime. 09/14/19   [provider]  sulfamethoxazole-trimethoprim (BACTRIM DS) 800-160 MG tablet Take 1 tablet by mouth 2 (two) times daily.  03/12/20   Jaynee Eagles, PA-C  traMADol (ULTRAM) 50 MG tablet Take 1 tablet (50 mg total) by mouth 2 (two) times daily as needed for severe pain. for pain 11/23/21   Antonietta Breach, PA-C  VESICARE 10 MG tablet Take 10 mg by mouth daily.  01/17/18   [provider]      Allergies    Metoprolol    Review of Systems   Review of Systems  Constitutional:  Negative for chills and fever.  Musculoskeletal:  Positive for arthralgias. Negative for joint swelling.  Neurological:  Negative for weakness and numbness.  All other systems reviewed and are negative.  Physical Exam Updated Vital Signs BP (!) 160/87 (BP Location: Left Arm)    Pulse 85    Temp 99.1 F (37.3 C) (Oral)    Resp 14    Ht 5' 4.5" (1.638 m)    Wt 59 kg    SpO2 100%    BMI 21.98 kg/m  Physical Exam Vitals and nursing note reviewed.  Constitutional:      General: She is not in acute distress.    Appearance: Normal appearance. She is well-developed. She is not ill-appearing or diaphoretic.  HENT:     Head: Normocephalic and atraumatic.  Eyes:     General:        Right eye: No discharge.        Left eye: No discharge.  Cardiovascular:     Rate and Rhythm: Normal rate and regular rhythm.  Pulmonary:     Effort: Pulmonary effort is normal. No respiratory distress.     Breath sounds: Normal breath sounds.  Musculoskeletal:        General: No deformity.     Comments: Minimal tenderness over both shoulders without appreciable swelling or deformity, no overlying skin changes, active and passive range of motion intact with mild discomfort.  Distal pulses 2+, normal sensation and strength in bilateral upper extremities, no midline cervical spine tenderness  Skin:    General: Skin is warm and dry.  Neurological:     Mental Status: She is alert and oriented to person, place, and time.     Coordination: Coordination normal.  Psychiatric:        Mood and Affect: Mood normal.        Behavior: Behavior normal.    ED Results /  Procedures / Treatments   Labs (all labs ordered are listed, but only abnormal results are displayed) Labs Reviewed - No data to display  EKG None  Radiology DG Shoulder Right  Result Date: 01/29/2022 CLINICAL DATA:  Shoulder pain for 3 months EXAM: RIGHT SHOULDER - 2+ VIEW COMPARISON:  None. FINDINGS: No fracture or malalignment. Mild degenerative changes. Right apex is clear IMPRESSION: Mild degenerative change Electronically Signed   By: Donavan Foil M.D.   On: 01/29/2022 18:59  DG Shoulder Left  Result Date: 01/29/2022 CLINICAL DATA:  Shoulder pain EXAM: LEFT SHOULDER - 2+ VIEW COMPARISON:  None. FINDINGS: No fracture or malalignment. Mild degenerative change of the [facility] joint and glenohumeral interval. IMPRESSION: Mild degenerative change Electronically Signed   By: Donavan Foil M.D.   On: 01/29/2022 19:00    Procedures Procedures    Medications Ordered in ED Medications - No data to display  ED Course/ Medical Decision Making/ A&P                           Medical Decision Making  82 year old female presents with bilateral shoulder pain that has been ongoing for several months, no acute injury or worsening of pain.  Has been evaluated for the same multiple times.  No associated chest pain or shortness of breath, no associated numbness or weakness.    On exam there is no concern for septic arthritis, active and passive range of motion intact in bilateral lower extremities are neurovascularly intact.  Differential includes rotator cuff tendinopathy, muscle spasm, arthritis.  X-rays of bilateral shoulders ordered from triage.  Independently reviewed and interpreted by myself, mild degenerative changes noted but no acute fracture, dislocation or other abnormality.  Do not feel that additional lab work or imaging is indicated at this time.  Discussed the importance of follow-up with orthopedics for continued evaluation of chronic shoulder pain.  Encourage patient to continue with  prescribed gabapentin and Tylenol for pain control.  She expresses understanding.  Discharged home in good condition.          Final Clinical Impression(s) / ED Diagnoses Final diagnoses:  Acute pain of both shoulders    Rx / DC Orders ED Discharge Orders     None         Janet Berlin 01/29/22 2238    Lacretia Leigh, MD 01/30/22 2157

## 2022-01-29 NOTE — ED Triage Notes (Signed)
Patient c/o intermittent bilateral shoulder cramping x 3 months.

## 2022-01-29 NOTE — ED Provider Triage Note (Signed)
Emergency Medicine Provider Triage Evaluation Note  Jacqueline Ball , a 81 y.o. female  was evaluated in triage.  Pt complains of bilateral shoulder pain is been ongoing for last 3 months.  Worse today.  Also having cramping that extends down to the arms.  No injury.  No weakness or numbness.  Review of Systems  Positive:  Negative: See above   Physical Exam  BP (!) 160/87 (BP Location: Left Arm)    Pulse 85    Temp 99.1 F (37.3 C) (Oral)    Resp 14    Ht 5' 4.5" (1.638 m)    Wt 59 kg    SpO2 100%    BMI 21.98 kg/m  Gen:   Awake, no distress   Resp:  Normal effort  MSK:   Moves extremities without difficulty  Other:  Full range of motion in both shoulders.  Good strength bilaterally.  Normal sensation bilaterally.  Medical Decision Making  Medically screening exam initiated at 6:29 PM.  Appropriate orders placed.  Sandhya Denherder was informed that the remainder of the evaluation will be completed by another provider, this initial triage assessment does not replace that evaluation, and the importance of remaining in the ED until their evaluation is complete.     Honor Loh [place], [place] 01/29/22 1831

## 2022-02-02 ENCOUNTER — Emergency Department (HOSPITAL_COMMUNITY)
Admission: EM | Admit: 2022-02-02 | Discharge: 2022-02-02 | Disposition: A | Discharge status: Home / Self Care | Payer: No Typology Code available for payment source | Attending: Emergency Medicine | Admitting: Emergency Medicine

## 2022-02-02 ENCOUNTER — Emergency Department (HOSPITAL_COMMUNITY): Payer: No Typology Code available for payment source

## 2022-02-02 ENCOUNTER — Encounter (HOSPITAL_COMMUNITY): Payer: Self-pay

## 2022-02-02 ENCOUNTER — Other Ambulatory Visit: Payer: Self-pay

## 2022-02-02 DIAGNOSIS — Z7901 Long term (current) use of anticoagulants: Secondary | ICD-10-CM | POA: Diagnosis not present

## 2022-02-02 DIAGNOSIS — X58XXXA Exposure to other specified factors, initial encounter: Secondary | ICD-10-CM | POA: Insufficient documentation

## 2022-02-02 DIAGNOSIS — R0989 Other specified symptoms and signs involving the circulatory and respiratory systems: Secondary | ICD-10-CM

## 2022-02-02 DIAGNOSIS — T17208A Unspecified foreign body in pharynx causing other injury, initial encounter: Secondary | ICD-10-CM | POA: Diagnosis not present

## 2022-02-02 NOTE — Discharge Instructions (Signed)
3 months is a long time to have a foreign body sensation to your throat.  You need to let your family doctor know about this and likely need to be seen by ear nose and throat to be evaluated for other possible causes of this problem.  Please return to the emergency department for difficulty breathing or swallowing.

## 2022-02-02 NOTE — ED Triage Notes (Addendum)
Patient c/o "feeling like something stuck " in her throat. Patient denies any swallowing difficulty and is speaking in complete sentences. Sats-99%.  Patient states that she keeps vomiting and when questioned patient stated, "I ain't had no vomiting."  Patient's daughter reports that the patient was at [facility] today and saw something in her throat.

## 2022-02-02 NOTE — ED Provider Triage Note (Signed)
Emergency Medicine Provider Triage Evaluation Note  Jacqueline Ball , a 81 y.o. female  was evaluated in triage.  Pt complains of "something stuck in throat" that has been ongoing for a few days. Admits to difficulties eating and drinking. No SOB. Daughter notes that patient was seen at [facility] today and was sent to the ED because x-ray confirmed foreign body? Denies chest pain  Review of Systems  Positive: Sore throat Negative: SOB  Physical Exam  BP 120/74 (BP Location: Right Arm)    Pulse 76    Temp 97.9 F (36.6 C) (Oral)    Resp 16    Ht 5' 4.5" (1.638 m)    Wt 59 kg    SpO2 100%    BMI 21.98 kg/m  Gen:   Awake, no distress   Resp:  Normal effort  MSK:   Moves extremities without difficulty  Other:  Airway patent, speaking in full sentences  Medical Decision Making  Medically screening exam initiated at 4:48 PM.  Appropriate orders placed.  Senaida Chilcote was informed that the remainder of the evaluation will be completed by another provider, this initial triage assessment does not replace that evaluation, and the importance of remaining in the ED until their evaluation is complete.  X-ray of neck EKG   Mannie Stabile, [place] 02/02/22 1649

## 2022-02-02 NOTE — ED Provider Notes (Signed)
[facility] DEPT Provider Note   CSN: [id number] Arrival date & time: 02/02/22  1603     History  Chief Complaint  Patient presents with   feels like something stuck in throat    Jacqueline Ball is a 81 y.o. female.  81 yo F with a chief complaint of a foreign body sensation to the throat.  This has been going on for about 3 months now.  She feels it more on the left side than the right.  She denies actually having anything stuck in her throat.  She denies any recent meals with fishbone sore with toothpicks.  She has been able to swallow both liquids and solids.  Has not discussed this with her family doctor.  As it had persisted today decided to come to the ED for evaluation.       Home Medications Prior to Admission medications   Medication Sig Start Date End Date Taking? Authorizing Provider  ACCU-CHEK GUIDE test strip USE AS DIRECTED UP TO THREE TIMES DAILY 01/16/21   [provider]  acetaminophen (TYLENOL) 500 MG tablet Take 500 mg by mouth every 6 (six) hours as needed for pain.    [provider]  apixaban (ELIQUIS) 5 MG TABS tablet Take 1 tablet (5 mg total) by mouth 2 (two) times daily. 11/24/14   Charolette Forward, MD  atenolol (TENORMIN) 50 MG tablet Take 50 mg by mouth daily. 07/29/19   [provider]  atorvastatin (LIPITOR) 40 MG tablet Take 40 mg by mouth at bedtime.    [provider]  azelastine (ASTELIN) 0.1 % nasal spray Place 1 spray into both nostrils as needed for rhinitis or allergies.  05/24/18   [provider]  Blood Glucose Monitoring Suppl (ACCU-CHEK GUIDE) w/Device KIT USE AS DIRECTED UP TO THREE TIMES DAILY 01/16/21   [provider]  cyclobenzaprine (FLEXERIL) 5 MG tablet Take 1-2 tablets (5-10 mg total) by mouth at bedtime. 06/06/20   Wieters, Hallie C, PA-C  diclofenac Sodium (VOLTAREN) 1 % GEL Apply one application (2g) up to four times daily as needed for pain. 12/08/21   Crain,  Whitney L, PA  dicyclomine (BENTYL) 20 MG tablet Take 20 mg by mouth 3 (three) times daily as needed for spasms. 08/29/18   [provider]  donepezil (ARICEPT) 10 MG tablet Take 1 tablet (10 mg total) by mouth at bedtime. 06/06/17   Rosalin Hawking, MD  esomeprazole (NEXIUM) 40 MG capsule Take 40 mg by mouth daily at 12 noon.    [provider]  estradiol (CLIMARA) 0.06 MG/24HR Place 0.06 patches onto the skin once a week. 08/12/17   [provider]  fluconazole (DIFLUCAN) 150 MG tablet fluconazole 150 mg tablet    [provider]  fluticasone (FLONASE) 50 MCG/ACT nasal spray Place 1 spray into both nostrils daily. 09/15/18   Bast, Tressia Miners A, NP  glimepiride (AMARYL) 4 MG tablet Take 4 mg by mouth daily with breakfast.    [provider]  losartan (COZAAR) 25 MG tablet Take 25 mg by mouth daily. 05/03/20   [provider]  metFORMIN (GLUCOPHAGE) 500 MG tablet Take 500 mg by mouth 2 (two) times daily with a meal.  08/23/15   [provider]  MYRBETRIQ 50 MG TB24 tablet Take 50 mg by mouth daily. 07/29/19   [provider]  nystatin cream (MYCOSTATIN) Apply to affected area 2 times daily 04/02/20   Loura Halt A, NP  omeprazole (PRILOSEC) 40 MG  capsule Take 40 mg by mouth daily. 11/27/19   [provider]  pantoprazole (PROTONIX) 20 MG tablet Take 1 tablet (20 mg total) by mouth daily. 02/24/20   Marney Setting, NP  rosuvastatin (CRESTOR) 10 MG tablet Take 10 mg by mouth at bedtime. 09/14/19   [provider]  sulfamethoxazole-trimethoprim (BACTRIM DS) 800-160 MG tablet Take 1 tablet by mouth 2 (two) times daily. 03/12/20   Jaynee Eagles, PA-C  traMADol (ULTRAM) 50 MG tablet Take 1 tablet (50 mg total) by mouth 2 (two) times daily as needed for severe pain. for pain 11/23/21   Antonietta Breach, PA-C  VESICARE 10 MG tablet Take 10 mg by mouth daily.  01/17/18   [provider]      Allergies    Metoprolol    Review of  Systems   Review of Systems  Physical Exam Updated Vital Signs BP 120/74 (BP Location: Right Arm)    Pulse 76    Temp 97.9 F (36.6 C) (Oral)    Resp 16    Ht 5' 4.5" (1.638 m)    Wt 59 kg    SpO2 100%    BMI 21.98 kg/m  Physical Exam Vitals and nursing note reviewed.  Constitutional:      General: She is not in acute distress.    Appearance: She is well-developed. She is not diaphoretic.  HENT:     Head: Normocephalic and atraumatic.     Comments: No obvious finding in the posterior oropharynx.  No tonsillar swelling or exudates.  Left anterior cervical lymphadenopathy very mild and nontender. Eyes:     Pupils: Pupils are equal, round, and reactive to light.  Cardiovascular:     Rate and Rhythm: Normal rate and regular rhythm.     Heart sounds: No murmur heard.   No friction rub. No gallop.  Pulmonary:     Effort: Pulmonary effort is normal.     Breath sounds: No wheezing or rales.  Abdominal:     General: There is no distension.     Palpations: Abdomen is soft.     Tenderness: There is no abdominal tenderness.  Musculoskeletal:        General: No tenderness.     Cervical back: Normal range of motion and neck supple.  Skin:    General: Skin is warm and dry.  Neurological:     Mental Status: She is alert and oriented to person, place, and time.  Psychiatric:        Behavior: Behavior normal.    ED Results / Procedures / Treatments   Labs (all labs ordered are listed, but only abnormal results are displayed) Labs Reviewed - No data to display  EKG EKG Interpretation  Date/Time:  Friday February 02 2022 16:54:29 EST Ventricular Rate:  79 PR Interval:  152 QRS Duration: 77 QT Interval:  369 QTC Calculation: 423 R Axis:   5 Text Interpretation: Sinus rhythm Abnormal R-wave progression, early transition Confirmed by Fredia Sorrow [phone]) on 02/02/2022 5:00:06 PM  Radiology DG Neck Soft Tissue  Result Date: 02/02/2022 CLINICAL DATA:  Foreign body sensation in  throat. EXAM: NECK SOFT TISSUES - 1+ VIEW COMPARISON:  Cervical spine CT 11/23/2021 FINDINGS: There is no evidence of retropharyngeal soft tissue swelling or epiglottic enlargement. The cervical airway is unremarkable. No radio-opaque foreign body identified. Soft tissue planes are non suspicious. Lung apices are clear. Degenerative change in the cervical spine. IMPRESSION: Negative soft tissue neck radiographs.  No radiopaque foreign body. Electronically  Signed   By: Keith Rake M.D.   On: 02/02/2022 17:17    Procedures Procedures    Medications Ordered in ED Medications - No data to display  ED Course/ Medical Decision Making/ A&P                           Medical Decision Making  81 yo F with a chief complaints of a foreign body sensation to her throat.  This was coupled with mild lymph node swelling could be consistent with a URI but with her timeline of having symptoms for 3 months I feel she benefit from referral to ENT.  I did discuss with her about having her see her family doctor as well so they can keep track of what was going on with her.  Soft tissue of the neck was ordered in triage interpreted by me without obvious foreign body or retropharyngeal abscess or epiglottitis.  Patient is well-appearing and nontoxic.  She is able to swallow here without issue.  6:11 PM:  I have discussed the diagnosis/risks/treatment options with the patient.  Evaluation and diagnostic testing in the emergency department does not suggest an emergent condition requiring admission or immediate intervention beyond what has been performed at this time.  They will follow up with  PCP, ENT. We also discussed returning to the ED immediately if new or worsening sx occur. We discussed the sx which are most concerning (e.g., sudden worsening pain, fever, inability to tolerate by mouth) that necessitate immediate return. Medications administered to the patient during their visit and any new prescriptions provided  to the patient are listed below.  Medications given during this visit Medications - No data to display   The patient appears reasonably screen and/or stabilized for discharge and I doubt any other medical condition or other [facility] requiring further screening, evaluation, or treatment in the ED at this time prior to discharge.          Final Clinical Impression(s) / ED Diagnoses Final diagnoses:  Foreign body sensation in throat    Rx / DC Orders ED Discharge Orders     None         Deno Etienne, DO 02/02/22 1811

## 2022-02-11 ENCOUNTER — Emergency Department (HOSPITAL_COMMUNITY): Payer: No Typology Code available for payment source

## 2022-02-11 ENCOUNTER — Emergency Department (HOSPITAL_COMMUNITY)
Admission: EM | Admit: 2022-02-11 | Discharge: 2022-02-11 | Disposition: A | Discharge status: Home / Self Care | Payer: No Typology Code available for payment source | Attending: Emergency Medicine | Admitting: Emergency Medicine

## 2022-02-11 ENCOUNTER — Encounter (HOSPITAL_COMMUNITY): Payer: Self-pay | Admitting: Emergency Medicine

## 2022-02-11 DIAGNOSIS — E1165 Type 2 diabetes mellitus with hyperglycemia: Secondary | ICD-10-CM | POA: Diagnosis not present

## 2022-02-11 DIAGNOSIS — I10 Essential (primary) hypertension: Secondary | ICD-10-CM | POA: Diagnosis not present

## 2022-02-11 DIAGNOSIS — E871 Hypo-osmolality and hyponatremia: Secondary | ICD-10-CM | POA: Diagnosis not present

## 2022-02-11 DIAGNOSIS — Z7901 Long term (current) use of anticoagulants: Secondary | ICD-10-CM | POA: Diagnosis not present

## 2022-02-11 DIAGNOSIS — R531 Weakness: Secondary | ICD-10-CM | POA: Diagnosis present

## 2022-02-11 DIAGNOSIS — N390 Urinary tract infection, site not specified: Secondary | ICD-10-CM | POA: Diagnosis not present

## 2022-02-11 DIAGNOSIS — Z7984 Long term (current) use of oral hypoglycemic drugs: Secondary | ICD-10-CM | POA: Diagnosis not present

## 2022-02-11 LAB — CBC WITH DIFFERENTIAL/PLATELET
Abs Immature Granulocytes: 0.01 10*3/uL (ref 0.00–0.07)
Basophils Absolute: 0 10*3/uL (ref 0.0–0.1)
Basophils Relative: 0 %
Eosinophils Absolute: 0 10*3/uL (ref 0.0–0.5)
Eosinophils Relative: 0 %
HCT: 38.5 % (ref 36.0–46.0)
Hemoglobin: 13.2 g/dL (ref 12.0–15.0)
Immature Granulocytes: 0 %
Lymphocytes Relative: 25 %
Lymphs Abs: 1.8 10*3/uL (ref 0.7–4.0)
MCH: 32 pg (ref 26.0–34.0)
MCHC: 34.3 g/dL (ref 30.0–36.0)
MCV: 93.4 fL (ref 80.0–100.0)
Monocytes Absolute: 0.6 10*3/uL (ref 0.1–1.0)
Monocytes Relative: 8 %
Neutro Abs: 4.7 10*3/uL (ref 1.7–7.7)
Neutrophils Relative %: 67 %
Platelets: 459 10*3/uL — ABNORMAL HIGH (ref 150–400)
RBC: 4.12 MIL/uL (ref 3.87–5.11)
RDW: 11.8 % (ref 11.5–15.5)
WBC: 7.1 10*3/uL (ref 4.0–10.5)
nRBC: 0 % (ref 0.0–0.2)

## 2022-02-11 LAB — COMPREHENSIVE METABOLIC PANEL
ALT: 16 U/L (ref 0–44)
AST: 14 U/L — ABNORMAL LOW (ref 15–41)
Albumin: 3.8 g/dL (ref 3.5–5.0)
Alkaline Phosphatase: 113 U/L (ref 38–126)
Anion gap: 9 (ref 5–15)
BUN: 13 mg/dL (ref 8–23)
CO2: 22 mmol/L (ref 22–32)
Calcium: 9.1 mg/dL (ref 8.9–10.3)
Chloride: 100 mmol/L (ref 98–111)
Creatinine, Ser: 0.71 mg/dL (ref 0.44–1.00)
GFR, Estimated: >60 mL/min (ref >60)
Glucose, Bld: 411 mg/dL — ABNORMAL HIGH (ref 70–99)
Potassium: 3.9 mmol/L (ref 3.5–5.1)
Sodium: 131 mmol/L — ABNORMAL LOW (ref 135–145)
Total Bilirubin: 0.8 mg/dL (ref 0.3–1.2)
Total Protein: 7.2 g/dL (ref 6.5–8.1)

## 2022-02-11 LAB — URINALYSIS, ROUTINE W REFLEX MICROSCOPIC
Bilirubin Urine: NEGATIVE
Glucose, UA: >=500 mg/dL — AB
Hgb urine dipstick: NEGATIVE
Ketones, ur: 20 mg/dL — AB
Nitrite: POSITIVE — AB
Protein, ur: NEGATIVE mg/dL
Specific Gravity, Urine: 1.028 (ref 1.005–1.030)
pH: 5 (ref 5.0–8.0)

## 2022-02-11 LAB — CBG MONITORING, ED: Glucose-Capillary: 364 mg/dL — ABNORMAL HIGH (ref 70–99)

## 2022-02-11 MED ORDER — CEPHALEXIN 500 MG PO CAPS
500.0000 mg | ORAL_CAPSULE | Freq: Once | ORAL | Status: AC
  Administered 2022-02-11: 500 mg via ORAL
  Filled 2022-02-11: qty 1

## 2022-02-11 MED ORDER — CEPHALEXIN 500 MG PO CAPS: 500.0000 mg | ORAL_CAPSULE | Freq: Two times a day (BID) | ORAL | 0 refills | Status: AC

## 2022-02-11 NOTE — ED Triage Notes (Signed)
Patient c/o generalized weakness x 19mo. States seen for same. Denies chest pain, SOB, N/V/D, cough, fever, and pain.

## 2022-02-11 NOTE — Discharge Instructions (Addendum)
An antibiotic by the name of Keflex has been sent to your pharmacy.  Please pick this up and take this for the full course.  Information on this medication has been provided in your discharge paperwork in addition to what we discussed.  Please follow-up with your primary care as discussed as soon as possible, preferably within the next 1 to 2 days for continued medical management and reevaluation  Return to the ED for new or worsening symptoms as discussed

## 2022-02-11 NOTE — ED Provider Notes (Signed)
[facility] DEPT Provider Note   CSN: [id number] Arrival date & time: 02/11/22  1238     History  Chief Complaint  Patient presents with   Weakness    Jacqueline Ball is a 81 y.o. female with a history of hypertension, diabetes mellitus type 2, atypical anxiety disorder, atrial fibrillation, and cognitive impairment presenting today for global weakness for an unknown amount of time.  She believes it may have been since this morning.  She states she felt off and decided to come to the hospital and not tell her husband whom she lives with.  When asked again she says "I am unsure as to why I felt weak".  Denies chest pain, loss of consciousness, shortness of breath, abdominal pain, fever, recent illness, loss of range of motion, numbness or tingling, urinary symptoms, constipation/diarrhea, joint pain, headache, or dyspnea on exertion.  Patient first states she had crab for dinner, then when asked again she states she did not eat but had a peach soda.  Unsure of who her primary care is, stating she is recently switched.  Gives various answers when asking the last time she saw her primary care, may have been last week.  Expresses desire to go home and that she is feeling better after resting.  Wants to call and see her primary care today after leaving the ED.  Has been seen several times in the 2 months for various complaint.  The history is provided by the patient and medical records.  Weakness     Home Medications Prior to Admission medications   Medication Sig Start Date End Date Taking? Authorizing Provider  cephALEXin (KEFLEX) 500 MG capsule Take 1 capsule (500 mg total) by mouth 2 (two) times daily for 7 days. 02/11/22 02/18/22 Yes Prince Rome, PA-C  ACCU-CHEK GUIDE test strip USE AS DIRECTED UP TO THREE TIMES DAILY 01/16/21   [provider]  acetaminophen (TYLENOL) 500 MG tablet Take 500 mg by mouth every 6 (six) hours as needed for pain.     [provider]  apixaban (ELIQUIS) 5 MG TABS tablet Take 1 tablet (5 mg total) by mouth 2 (two) times daily. 11/24/14   Charolette Forward, MD  atenolol (TENORMIN) 50 MG tablet Take 50 mg by mouth daily. 07/29/19   [provider]  atorvastatin (LIPITOR) 40 MG tablet Take 40 mg by mouth at bedtime.    [provider]  azelastine (ASTELIN) 0.1 % nasal spray Place 1 spray into both nostrils as needed for rhinitis or allergies.  05/24/18   [provider]  Blood Glucose Monitoring Suppl (ACCU-CHEK GUIDE) w/Device KIT USE AS DIRECTED UP TO THREE TIMES DAILY 01/16/21   [provider]  cyclobenzaprine (FLEXERIL) 5 MG tablet Take 1-2 tablets (5-10 mg total) by mouth at bedtime. 06/06/20   Wieters, Hallie C, PA-C  diclofenac Sodium (VOLTAREN) 1 % GEL Apply one application (2g) up to four times daily as needed for pain. 12/08/21   Crain, Whitney L, PA  dicyclomine (BENTYL) 20 MG tablet Take 20 mg by mouth 3 (three) times daily as needed for spasms. 08/29/18   [provider]  donepezil (ARICEPT) 10 MG tablet Take 1 tablet (10 mg total) by mouth at bedtime. 06/06/17   Rosalin Hawking, MD  esomeprazole (NEXIUM) 40 MG capsule Take 40 mg by mouth daily at 12 noon.    [provider]  estradiol (CLIMARA) 0.06 MG/24HR Place 0.06 patches onto the skin once a week. 08/12/17  [provider]  fluconazole (DIFLUCAN) 150 MG tablet fluconazole 150 mg tablet    [provider]  fluticasone (FLONASE) 50 MCG/ACT nasal spray Place 1 spray into both nostrils daily. 09/15/18   Bast, Tressia Miners A, NP  glimepiride (AMARYL) 4 MG tablet Take 4 mg by mouth daily with breakfast.    [provider]  losartan (COZAAR) 25 MG tablet Take 25 mg by mouth daily. 05/03/20   [provider]  metFORMIN (GLUCOPHAGE) 500 MG tablet Take 500 mg by mouth 2 (two) times daily with a meal.  08/23/15   [provider]  MYRBETRIQ 50 MG TB24 tablet Take 50 mg by mouth  daily. 07/29/19   [provider]  nystatin cream (MYCOSTATIN) Apply to affected area 2 times daily 04/02/20   Loura Halt A, NP  omeprazole (PRILOSEC) 40 MG capsule Take 40 mg by mouth daily. 11/27/19   [provider]  pantoprazole (PROTONIX) 20 MG tablet Take 1 tablet (20 mg total) by mouth daily. 02/24/20   Marney Setting, NP  rosuvastatin (CRESTOR) 10 MG tablet Take 10 mg by mouth at bedtime. 09/14/19   [provider]  sulfamethoxazole-trimethoprim (BACTRIM DS) 800-160 MG tablet Take 1 tablet by mouth 2 (two) times daily. 03/12/20   Jaynee Eagles, PA-C  traMADol (ULTRAM) 50 MG tablet Take 1 tablet (50 mg total) by mouth 2 (two) times daily as needed for severe pain. for pain 11/23/21   Antonietta Breach, PA-C  VESICARE 10 MG tablet Take 10 mg by mouth daily.  01/17/18   [provider]      Allergies    Metoprolol    Review of Systems   Review of Systems  Neurological:  Positive for weakness.   Physical Exam Updated Vital Signs BP 114/73    Pulse 72    Temp 97.8 F (36.6 C) (Oral)    Resp (!) 23    SpO2 98%  Physical Exam Vitals and nursing note reviewed.  Constitutional:      General: She is not in acute distress.    Appearance: Normal appearance. She is well-developed and normal weight. She is not ill-appearing or diaphoretic.  HENT:     Head: Normocephalic and atraumatic.     Nose: Nose normal.     Mouth/Throat:     Mouth: Mucous membranes are moist.     Pharynx: Oropharynx is clear.  Eyes:     General: No scleral icterus.    Extraocular Movements: Extraocular movements intact.     Conjunctiva/sclera: Conjunctivae normal.  Cardiovascular:     Rate and Rhythm: Normal rate and regular rhythm.     Pulses: Normal pulses.     Heart sounds: No murmur heard. Pulmonary:     Effort: Pulmonary effort is normal. No respiratory distress.     Breath sounds: Normal breath sounds. No wheezing.  Abdominal:     General: Bowel sounds are normal.      Palpations: Abdomen is soft.     Tenderness: There is no abdominal tenderness.  Musculoskeletal:        General: No swelling or tenderness.     Cervical back: Neck supple.     Comments: Adequate strength and range of motion of upper and lower extremities  Skin:    General: Skin is warm and dry.     Capillary Refill: Capillary refill takes less than 2 seconds.     Coloration: Skin is not jaundiced or pale.  Neurological:     General: No  focal deficit present.     Mental Status: She is alert and oriented to person, place, and time.  Psychiatric:        Mood and Affect: Mood normal.        Behavior: Behavior is cooperative.     Comments: Has various answers when asked questions that are oriented around memories Awake and grossly appropriate responses Wondering if there is currently mild dementia    ED Results / Procedures / Treatments   Labs (all labs ordered are listed, but only abnormal results are displayed) Labs Reviewed  CBC WITH DIFFERENTIAL/PLATELET - Abnormal; Notable for the following components:      Result Value   Platelets 459 (*)    All other components within normal limits  COMPREHENSIVE METABOLIC PANEL - Abnormal; Notable for the following components:   Sodium 131 (*)    Glucose, Bld 411 (*)    AST 14 (*)    All other components within normal limits  URINALYSIS, ROUTINE W REFLEX MICROSCOPIC - Abnormal; Notable for the following components:   APPearance CLOUDY (*)    Glucose, UA >=500 (*)    Ketones, ur 20 (*)    Nitrite POSITIVE (*)    Leukocytes,Ua SMALL (*)    Bacteria, UA MANY (*)    All other components within normal limits  CBG MONITORING, ED - Abnormal; Notable for the following components:   Glucose-Capillary 364 (*)    All other components within normal limits    EKG EKG Interpretation  Date/Time:  Sunday February 11 2022 12:56:58 EST Ventricular Rate:  81 PR Interval:  163 QRS Duration: 88 QT Interval:  372 QTC Calculation: 432 R  Axis:   14 Text Interpretation: Sinus rhythm Baseline wander Otherwise within normal limits Confirmed by Carmin Muskrat ([id number]) on 02/11/2022 2:09:47 PM  Radiology CT Head Wo Contrast  Result Date: 02/11/2022 CLINICAL DATA:  Mental status changeMental status change, unknown cause EXAM: CT HEAD WITHOUT CONTRAST TECHNIQUE: Contiguous axial images were obtained from the base of the skull through the vertex without intravenous contrast. RADIATION DOSE REDUCTION: This exam was performed according to the departmental dose-optimization program which includes automated exposure control, adjustment of the mA and/or kV according to patient size and/or use of iterative reconstruction technique. COMPARISON:  03/15/2021 FINDINGS: Brain: No acute intracranial hemorrhage. No focal mass lesion. No CT evidence of acute infarction. No midline shift or mass effect. No hydrocephalus. Basilar cisterns are patent. Minimal periventricular and subcortical white matter hypodensities. Mild generalized cortical atrophy. Vascular: No hyperdense vessel or unexpected calcification. Skull: Normal. Negative for fracture or focal lesion. Sinuses/Orbits: Paranasal sinuses and mastoid air cells are clear. Orbits are clear. Other: None. IMPRESSION: 1. No acute intracranial findings. 2. 3. 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   On: 02/11/2022 15:06   DG Chest Portable 1 View  Result Date: 02/11/2022 CLINICAL DATA:  Pt complained of generalized weakness for about three months. Hx of diabetes and HTN.weakness EXAM: PORTABLE CHEST 1 VIEW COMPARISON:  None. FINDINGS: Normal mediastinum and cardiac silhouette. Normal pulmonary vasculature. No evidence of effusion, infiltrate, or pneumothorax. No acute bony abnormality. IMPRESSION: No acute cardiopulmonary process. Electronically Signed   By: Suzy Bouchard M.D.   On: 02/11/2022 13:42    Procedures Procedures    Medications  Ordered in ED Medications  cephALEXin (KEFLEX) capsule 500 mg (500 mg Oral Given 02/11/22 1640)    ED Course/ Medical Decision Making/ A&P  Medical Decision Making Amount and/or Complexity of Data Reviewed External Data Reviewed: notes. Labs: ordered. Decision-making details documented in ED Course. Radiology: ordered and independent interpretation performed. Decision-making details documented in ED Course. ECG/medicine tests: ordered and independent interpretation performed. Decision-making details documented in ED Course.   81 y.o. female presents to the ED for concern of Weakness .  This involves an extensive number of treatment options, and is a complaint that carries with it a high risk of complications and morbidity.  The differential diagnosis includes urinary tract infection, anemia, intracranial hemorrhage, arrhythmia, cognitive decline  Comorbidities that complicate the patient evaluation include hypertension, diabetes, hyperlipidemia, atrial fibrillation, cognitive impairment, and anxiety disorder  Additional history obtained from internal/external records available via epic  Interpretation: I ordered, and personally interpreted labs.  The pertinent results include: H&H not suggestive of anemia.  LFTs and electrolytes overall unremarkable, with the exception of sodium at 131 which is slightly below normal.  Fingerstick glucose 364 and venous sample of 411.  Patient's blood glucose value has varied between 100's and over 600 over the last year or so.  Patient currently on metformin and glimepiride and is not currently on insulin.  Urinalysis significant for glucose, ketones, and evidence of bacteria presents without evidence of hematuria.  Supportive UTI.  I ordered imaging studies including chest x-ray, CT of the head.  I independently visualized and interpreted imaging which showed no acute cardiopulmonary process or pathology or intracranial pathology or  abnormality.  I agree with the radiologist interpretation.    Personally ordered and interpreted EKG to show normal sinus rhythm, without evidence of ST segment changes, ischemic changes, arrhythmia, or bundle branch blocks.  Intervention: Considered medication for hyperglycemia management, but the patient has consistently elevated blood glucose, especially over the last few months to 1 year.  She also has no systemic complaints or lab values suggesting metabolic acidosis, especially considering her labs and presentation from her ED visit 3 weeks ago.  She states she feels much better and no longer has any symptoms after resting and would like to go home.  I have reviewed the patients home medicines and have made adjustments as needed  The patient was maintained on a cardiac monitor.  I personally viewed and interpreted the cardiac monitored which showed an underlying rhythm of: Normal sinus rhythm  ED Course: Patient well-appearing, smiling/laughing, and engaging on physical exam.  Has no complaints other than weakness she was feeling previously this morning.  States the weakness has completely or for the most part dissipated.  Physical exam not suggestive of any acute process or emergency.  No neurodeficits observed.  Labs do indicate elevated blood glucose, but it appears to be a waxing and waning pattern over the last year.  I believe patient may benefit from adjustment of antidiabetic medication regimen.  Monitored cardiac rhythm without evidence of an arrhythmia, even with history of A-fib.  Without dyspnea on exertion or other suspicious cardiac complaints.  Considered intracranial hemorrhage with patient's history of Eliquis and possible cognitive difficulty, but imaging not supportive of acute intracranial process.  Patient is afebrile without WBC changes or abdominal pain or urinary symptoms, urinalysis indicates a UTI.  Suspicion of possible early stages of dementia documented in recent previous  visits.  Encouraged discussion about chronic hyperglycemia and transient weakness with primary care within the next 1 to 2 days.  Will provide antibiotics for UTI.  Attempted to contact spouse via contact information and was unsuccessful.  Social Determinants of Health include  elderly population  Disposition: I discussed the patient and their case with my attending, Dr. Vanita Panda, who agreed with the proposed treatment course.  After consideration of the diagnostic results and the patient's response to treatment, I feel that the patent would benefit from discharge home and antibiotics for UTI treatment and outpatient follow-up with primary care soon as possible.  Discussed course of treatment thoroughly with the patient and she demonstrated understanding.  Patient in agreement and has no further questions.        Final Clinical Impression(s) / ED Diagnoses Final diagnoses:  Weakness  Urinary tract infection without hematuria, site unspecified    Rx / DC Orders ED Discharge Orders          Ordered    cephALEXin (KEFLEX) 500 MG capsule  2 times daily        02/11/22 1632              Candace Cruise [birth year] 1642    Carmin Muskrat, MD 02/12/22 2236

## 2022-02-26 ENCOUNTER — Encounter (HOSPITAL_COMMUNITY): Payer: Self-pay | Admitting: Emergency Medicine

## 2022-02-26 ENCOUNTER — Emergency Department (HOSPITAL_COMMUNITY)
Admission: EM | Admit: 2022-02-26 | Discharge: 2022-02-27 | Discharge status: Against Medical Advice | Payer: No Typology Code available for payment source | Attending: Physician Assistant | Admitting: Physician Assistant

## 2022-02-26 DIAGNOSIS — R42 Dizziness and giddiness: Secondary | ICD-10-CM | POA: Insufficient documentation

## 2022-02-26 DIAGNOSIS — Z5321 Procedure and treatment not carried out due to patient leaving prior to being seen by health care provider: Secondary | ICD-10-CM | POA: Diagnosis not present

## 2022-02-26 LAB — COMPREHENSIVE METABOLIC PANEL
ALT: 15 U/L (ref 0–44)
AST: 15 U/L (ref 15–41)
Albumin: 3.2 g/dL — ABNORMAL LOW (ref 3.5–5.0)
Alkaline Phosphatase: 127 U/L — ABNORMAL HIGH (ref 38–126)
Anion gap: 8 (ref 5–15)
BUN: 7 mg/dL — ABNORMAL LOW (ref 8–23)
CO2: 26 mmol/L (ref 22–32)
Calcium: 9.3 mg/dL (ref 8.9–10.3)
Chloride: 98 mmol/L (ref 98–111)
Creatinine, Ser: 0.81 mg/dL (ref 0.44–1.00)
GFR, Estimated: >60 mL/min (ref >60)
Glucose, Bld: 523 mg/dL (ref 70–99)
Potassium: 3.9 mmol/L (ref 3.5–5.1)
Sodium: 132 mmol/L — ABNORMAL LOW (ref 135–145)
Total Bilirubin: 0.5 mg/dL (ref 0.3–1.2)
Total Protein: 6.8 g/dL (ref 6.5–8.1)

## 2022-02-26 LAB — CBC WITH DIFFERENTIAL/PLATELET
Abs Immature Granulocytes: 0.01 10*3/uL (ref 0.00–0.07)
Basophils Absolute: 0 10*3/uL (ref 0.0–0.1)
Basophils Relative: 0 %
Eosinophils Absolute: 0 10*3/uL (ref 0.0–0.5)
Eosinophils Relative: 0 %
HCT: 36.5 % (ref 36.0–46.0)
Hemoglobin: 12.1 g/dL (ref 12.0–15.0)
Immature Granulocytes: 0 %
Lymphocytes Relative: 26 %
Lymphs Abs: 1.5 10*3/uL (ref 0.7–4.0)
MCH: 31.5 pg (ref 26.0–34.0)
MCHC: 33.2 g/dL (ref 30.0–36.0)
MCV: 95.1 fL (ref 80.0–100.0)
Monocytes Absolute: 0.6 10*3/uL (ref 0.1–1.0)
Monocytes Relative: 10 %
Neutro Abs: 3.5 10*3/uL (ref 1.7–7.7)
Neutrophils Relative %: 64 %
Platelets: 321 10*3/uL (ref 150–400)
RBC: 3.84 MIL/uL — ABNORMAL LOW (ref 3.87–5.11)
RDW: 12.1 % (ref 11.5–15.5)
WBC: 5.6 10*3/uL (ref 4.0–10.5)
nRBC: 0 % (ref 0.0–0.2)

## 2022-02-26 LAB — TROPONIN I (HIGH SENSITIVITY): Troponin I (High Sensitivity): 5 ng/L (ref <18)

## 2022-02-26 NOTE — ED Triage Notes (Signed)
Patient here with complaint of feeling tired for the last month. Denies pain. Patient states "I think something is wrong with me I just can't detect it." Patient oriented to self, location, disoriented to time. (States March 28, does not know the year) ?

## 2022-02-26 NOTE — ED Provider Triage Note (Signed)
Emergency Medicine Provider Triage Evaluation Note ? ?Jacqueline Ball , a 81 y.o. female  was evaluated in triage.  Pt complains of lightheadedness. ? ?Review of Systems  ?Positive: lightheaded ?Negative: Chest pain, nvd, sob ? ?Physical Exam  ?BP 120/86 (BP Location: Right Arm)   Pulse 85   Temp 97.9 ?F (36.6 ?C) (Oral)   Resp 16   SpO2 100%  ?Gen:   Awake, no distress   ?Resp:  Normal effort  ?MSK:   Moves extremities without difficulty  ?No facial droop, clear speech, 5/5 strength to the bue/ble ? ?Medical Decision Making  ?Medically screening exam initiated at 2:07 PM.  Appropriate orders placed.  Jacqueline Ball was informed that the remainder of the evaluation will be completed by another provider, this initial triage assessment does not replace that evaluation, and the importance of remaining in the ED until their evaluation is complete. ? ? ?  ?Jacqueline Frederic Reegan Mctighe S, PA-C ?02/26/22 1409 ? ?

## 2022-03-02 ENCOUNTER — Other Ambulatory Visit: Payer: Self-pay

## 2022-03-02 ENCOUNTER — Encounter (HOSPITAL_COMMUNITY): Payer: Self-pay

## 2022-03-02 ENCOUNTER — Emergency Department (HOSPITAL_COMMUNITY)
Admission: EM | Admit: 2022-03-02 | Discharge: 2022-03-02 | Disposition: A | Discharge status: Home / Self Care | Payer: No Typology Code available for payment source | Attending: Emergency Medicine | Admitting: Emergency Medicine

## 2022-03-02 DIAGNOSIS — Z7901 Long term (current) use of anticoagulants: Secondary | ICD-10-CM | POA: Insufficient documentation

## 2022-03-02 DIAGNOSIS — R300 Dysuria: Secondary | ICD-10-CM | POA: Insufficient documentation

## 2022-03-02 DIAGNOSIS — R21 Rash and other nonspecific skin eruption: Secondary | ICD-10-CM | POA: Diagnosis present

## 2022-03-02 LAB — URINALYSIS, ROUTINE W REFLEX MICROSCOPIC
Bacteria, UA: NONE SEEN
Bilirubin Urine: NEGATIVE
Glucose, UA: >=500 mg/dL — AB
Hgb urine dipstick: NEGATIVE
Ketones, ur: 5 mg/dL — AB
Nitrite: NEGATIVE
Protein, ur: NEGATIVE mg/dL
Specific Gravity, Urine: 1.029 (ref 1.005–1.030)
pH: 7 (ref 5.0–8.0)

## 2022-03-02 LAB — WET PREP, GENITAL
Clue Cells Wet Prep HPF POC: NONE SEEN
Sperm: NONE SEEN
Trich, Wet Prep: NONE SEEN
WBC, Wet Prep HPF POC: <10 (ref <10)
Yeast Wet Prep HPF POC: NONE SEEN

## 2022-03-02 LAB — HIV ANTIBODY (ROUTINE TESTING W REFLEX): HIV Screen 4th Generation wRfx: NONREACTIVE

## 2022-03-02 MED ORDER — CEPHALEXIN 500 MG PO CAPS: 1000.0000 mg | ORAL_CAPSULE | Freq: Two times a day (BID) | ORAL | 0 refills | Status: AC

## 2022-03-02 NOTE — ED Provider Notes (Signed)
?[facility] DEPT ?Provider Note ? ? ?CSN: [id number] ?Arrival date & time: 03/02/22  1128 ? ?  ? ?History ? ?Chief Complaint  ?Patient presents with  ? foot rash  ? ? ?Jacqueline Ball is a 81 y.o. female. ? ?81 yo F with a chief complaint of a rash to the sole of her left foot.  This been going on for at least 3 months.  She does think that it is painful.  Has been picking at it a bit.  She denies any obvious injury to it.  She thinks that she has diabetes.  She is not sure if she is ever been diagnosed with peripheral neuropathy.  She is seen a few days ago for this.  Was told to follow-up as an outpatient.  She also was having urinary symptoms.  Was started on an antibiotic.  She has been taking that but does not think that her symptoms of gotten much better.  She has some mild back pain.  No fevers. ? ? ? ?  ? ?Home Medications ?Prior to Admission medications   ?Medication Sig Start Date End Date Taking? Authorizing Provider  ?cephALEXin (KEFLEX) 500 MG capsule Take 2 capsules (1,000 mg total) by mouth 2 (two) times daily for 7 days. 03/02/22 03/09/22 Yes Deno Etienne, DO  ?ACCU-CHEK GUIDE test strip USE AS DIRECTED UP TO THREE TIMES DAILY 01/16/21   [provider]  ?acetaminophen (TYLENOL) 500 MG tablet Take 500 mg by mouth every 6 (six) hours as needed for pain.    [provider]  ?apixaban (ELIQUIS) 5 MG TABS tablet Take 1 tablet (5 mg total) by mouth 2 (two) times daily. 11/24/14   Charolette Forward, MD  ?atenolol (TENORMIN) 50 MG tablet Take 50 mg by mouth daily. 07/29/19   [provider]  ?atorvastatin (LIPITOR) 40 MG tablet Take 40 mg by mouth at bedtime.    [provider]  ?azelastine (ASTELIN) 0.1 % nasal spray Place 1 spray into both nostrils as needed for rhinitis or allergies.  05/24/18   [provider]  ?Blood Glucose Monitoring Suppl (ACCU-CHEK GUIDE) w/Device KIT USE AS DIRECTED UP TO THREE TIMES DAILY 01/16/21   [provider]   ?cyclobenzaprine (FLEXERIL) 5 MG tablet Take 1-2 tablets (5-10 mg total) by mouth at bedtime. 06/06/20   Wieters, Hallie C, PA-C  ?diclofenac Sodium (VOLTAREN) 1 % GEL Apply one application (2g) up to four times daily as needed for pain. 12/08/21   Crain, Loree Fee L, PA  ?dicyclomine (BENTYL) 20 MG tablet Take 20 mg by mouth 3 (three) times daily as needed for spasms. 08/29/18   [provider]  ?donepezil (ARICEPT) 10 MG tablet Take 1 tablet (10 mg total) by mouth at bedtime. 06/06/17   Rosalin Hawking, MD  ?esomeprazole (NEXIUM) 40 MG capsule Take 40 mg by mouth daily at 12 noon.    [provider]  ?estradiol (CLIMARA) 0.06 MG/24HR Place 0.06 patches onto the skin once a week. 08/12/17   [provider]  ?fluconazole (DIFLUCAN) 150 MG tablet fluconazole 150 mg tablet    [provider]  ?fluticasone (FLONASE) 50 MCG/ACT nasal spray Place 1 spray into both nostrils daily. 09/15/18   Loura Halt A, NP  ?glimepiride (AMARYL) 4 MG tablet Take 4 mg by mouth daily with breakfast.    [provider]  ?losartan (COZAAR) 25 MG tablet Take 25 mg by mouth daily. 05/03/20   [provider]  ?metFORMIN (GLUCOPHAGE) 500 MG tablet  Take 500 mg by mouth 2 (two) times daily with a meal.  08/23/15   [provider]  ?MYRBETRIQ 50 MG TB24 tablet Take 50 mg by mouth daily. 07/29/19   [provider]  ?nystatin cream (MYCOSTATIN) Apply to affected area 2 times daily 04/02/20   Loura Halt A, NP  ?omeprazole (PRILOSEC) 40 MG capsule Take 40 mg by mouth daily. 11/27/19   [provider]  ?pantoprazole (PROTONIX) 20 MG tablet Take 1 tablet (20 mg total) by mouth daily. 02/24/20   Marney Setting, NP  ?rosuvastatin (CRESTOR) 10 MG tablet Take 10 mg by mouth at bedtime. 09/14/19   [provider]  ?sulfamethoxazole-trimethoprim (BACTRIM DS) 800-160 MG tablet Take 1 tablet by mouth 2 (two) times daily. 03/12/20   Jaynee Eagles, PA-C  ?traMADol (ULTRAM) 50 MG tablet  Take 1 tablet (50 mg total) by mouth 2 (two) times daily as needed for severe pain. for pain 11/23/21   Antonietta Breach, PA-C  ?VESICARE 10 MG tablet Take 10 mg by mouth daily.  01/17/18   [provider]  ?   ? ?Allergies    ?Metoprolol   ? ?Review of Systems   ?Review of Systems ? ?Physical Exam ?Updated Vital Signs ?BP 126/84   Pulse 83   Temp 98 ?F (36.7 ?C) (Oral)   Resp 18   Ht 5' 4.5" (1.638 m)   Wt 62.1 kg   SpO2 100%   BMI 23.15 kg/m?  ?Physical Exam ?Vitals and nursing note reviewed.  ?Constitutional:   ?   General: She is not in acute distress. ?   Appearance: She is well-developed. She is not diaphoretic.  ?HENT:  ?   Head: Normocephalic and atraumatic.  ?Eyes:  ?   Pupils: Pupils are equal, round, and reactive to light.  ?Cardiovascular:  ?   Rate and Rhythm: Normal rate and regular rhythm.  ?   Heart sounds: No murmur heard. ?  No friction rub. No gallop.  ?Pulmonary:  ?   Effort: Pulmonary effort is normal.  ?   Breath sounds: No wheezing or rales.  ?Abdominal:  ?   General: There is no distension.  ?   Palpations: Abdomen is soft.  ?   Tenderness: There is no abdominal tenderness.  ?Musculoskeletal:     ?   General: No tenderness.  ?   Cervical back: Normal range of motion and neck supple.  ?   Comments: Old, ulcerative appearing lesions to the plantar aspect of the left foot.  Not obviously tender.  No erythema no warmth.  Pulse motor and sensation intact.  ?Skin: ?   General: Skin is warm and dry.  ?Neurological:  ?   Mental Status: She is alert and oriented to person, place, and time.  ?Psychiatric:     ?   Behavior: Behavior normal.  ? ? ?ED Results / Procedures / Treatments   ?Labs ?(all labs ordered are listed, but only abnormal results are displayed) ?Labs Reviewed  ?URINALYSIS, ROUTINE W REFLEX MICROSCOPIC - Abnormal; Notable for the following components:  ?    Result Value  ? APPearance HAZY (*)   ? Glucose, UA >=500 (*)   ? Ketones, ur 5 (*)   ? Leukocytes,Ua MODERATE (*)   ? All  other components within normal limits  ?WET PREP, GENITAL  ?HIV ANTIBODY (ROUTINE TESTING W REFLEX)  ?RPR  ?GC/CHLAMYDIA PROBE AMP ([place]) NOT AT [facility]  ? ? ?EKG ?None ? ?Radiology ?No results found. ? ?  Procedures ?Procedures  ? ? ?Medications Ordered in ED ?Medications - No data to display ? ?ED Course/ Medical Decision Making/ A&P ?  ?                        ?Medical Decision Making ?Amount and/or Complexity of Data Reviewed ?Labs: ordered. ? ?Risk ?Prescription drug management. ? ? ?26 yoF with a chief complaints of a rash to the bottom of her left foot.  This been going on for at least 3 months.  She was recently seen at an [facility] and I was able to review the notes.  At that time she had a UA that was equivocal but was started on antibiotic therapy anyway.  She had a urine culture that unfortunately seems to be resistant to the antibiotic she was started on.  With her having ongoing symptoms we will change her to Keflex.  We will repeat a UA today.  The lesions on her foot could be consistent with syphilis.  We will send off RPR testing.  HIV.  Swab for GC. ? ? ?Encouraged PCP follow up.  ? ?2:04 PM:  I have discussed the diagnosis/risks/treatment options with the patient.  Evaluation and diagnostic testing in the emergency department does not suggest an emergent condition requiring admission or immediate intervention beyond what has been performed at this time.  They will follow up with  PCP. We also discussed returning to the ED immediately if new or worsening sx occur. We discussed the sx which are most concerning (e.g., sudden worsening pain, fever, inability to tolerate by mouth) that necessitate immediate return. Medications administered to the patient during their visit and any new prescriptions provided to the patient are listed below. ? ?Medications given during this visit ?Medications - No data to display ? ? ?The patient appears reasonably screen and/or stabilized for  discharge and I doubt any other medical condition or other [facility] requiring further screening, evaluation, or treatment in the ED at this time prior to discharge.  ? ? ? ? ? ? ? ? ?Final Clinical Impression(s) / ED Sharma Covert

## 2022-03-02 NOTE — Discharge Instructions (Addendum)
Follow up with your family doc.  °

## 2022-03-02 NOTE — ED Triage Notes (Addendum)
Patient c/o bilateral foot rash x 2-3 weeks. ?Patient also c/o vaginal burning and states she has been using OTC vaginal creams with no relief. ?

## 2022-03-03 LAB — RPR: RPR Ser Ql: NONREACTIVE

## 2022-03-05 LAB — GC/CHLAMYDIA PROBE AMP (~~LOC~~) NOT AT ARMC
Chlamydia: NEGATIVE
Comment: NEGATIVE
Comment: NORMAL
Neisseria Gonorrhea: NEGATIVE

## 2022-03-27 ENCOUNTER — Encounter (HOSPITAL_COMMUNITY): Payer: Self-pay

## 2022-03-27 ENCOUNTER — Other Ambulatory Visit: Payer: Self-pay

## 2022-03-27 ENCOUNTER — Emergency Department (HOSPITAL_COMMUNITY)
Admission: EM | Admit: 2022-03-27 | Discharge: 2022-03-28 | Disposition: A | Discharge status: Home / Self Care | Payer: Medicare HMO | Attending: Physician Assistant | Admitting: Physician Assistant

## 2022-03-27 DIAGNOSIS — Z5321 Procedure and treatment not carried out due to patient leaving prior to being seen by health care provider: Secondary | ICD-10-CM | POA: Diagnosis not present

## 2022-03-27 DIAGNOSIS — K6289 Other specified diseases of anus and rectum: Secondary | ICD-10-CM | POA: Diagnosis not present

## 2022-03-27 DIAGNOSIS — R21 Rash and other nonspecific skin eruption: Secondary | ICD-10-CM | POA: Diagnosis not present

## 2022-03-27 DIAGNOSIS — R300 Dysuria: Secondary | ICD-10-CM | POA: Insufficient documentation

## 2022-03-27 DIAGNOSIS — A6004 Herpesviral vulvovaginitis: Secondary | ICD-10-CM | POA: Diagnosis not present

## 2022-03-27 LAB — CBC WITH DIFFERENTIAL/PLATELET
Abs Immature Granulocytes: 0.01 10*3/uL (ref 0.00–0.07)
Basophils Absolute: 0 10*3/uL (ref 0.0–0.1)
Basophils Relative: 0 %
Eosinophils Absolute: 0 10*3/uL (ref 0.0–0.5)
Eosinophils Relative: 0 %
HCT: 35.6 % — ABNORMAL LOW (ref 36.0–46.0)
Hemoglobin: 11.9 g/dL — ABNORMAL LOW (ref 12.0–15.0)
Immature Granulocytes: 0 %
Lymphocytes Relative: 37 %
Lymphs Abs: 2.4 10*3/uL (ref 0.7–4.0)
MCH: 31.8 pg (ref 26.0–34.0)
MCHC: 33.4 g/dL (ref 30.0–36.0)
MCV: 95.2 fL (ref 80.0–100.0)
Monocytes Absolute: 0.5 10*3/uL (ref 0.1–1.0)
Monocytes Relative: 7 %
Neutro Abs: 3.6 10*3/uL (ref 1.7–7.7)
Neutrophils Relative %: 56 %
Platelets: 311 10*3/uL (ref 150–400)
RBC: 3.74 MIL/uL — ABNORMAL LOW (ref 3.87–5.11)
RDW: 12.4 % (ref 11.5–15.5)
WBC: 6.5 10*3/uL (ref 4.0–10.5)
nRBC: 0 % (ref 0.0–0.2)

## 2022-03-27 LAB — COMPREHENSIVE METABOLIC PANEL
ALT: 14 U/L (ref 0–44)
AST: 16 U/L (ref 15–41)
Albumin: 3.5 g/dL (ref 3.5–5.0)
Alkaline Phosphatase: 109 U/L (ref 38–126)
Anion gap: 9 (ref 5–15)
BUN: <5 mg/dL — ABNORMAL LOW (ref 8–23)
CO2: 22 mmol/L (ref 22–32)
Calcium: 9.4 mg/dL (ref 8.9–10.3)
Chloride: 104 mmol/L (ref 98–111)
Creatinine, Ser: 0.68 mg/dL (ref 0.44–1.00)
GFR, Estimated: >60 mL/min (ref >60)
Glucose, Bld: 353 mg/dL — ABNORMAL HIGH (ref 70–99)
Potassium: 3.6 mmol/L (ref 3.5–5.1)
Sodium: 135 mmol/L (ref 135–145)
Total Bilirubin: 0.4 mg/dL (ref 0.3–1.2)
Total Protein: 6.7 g/dL (ref 6.5–8.1)

## 2022-03-27 LAB — URINALYSIS, ROUTINE W REFLEX MICROSCOPIC
Bilirubin Urine: NEGATIVE
Glucose, UA: >=500 mg/dL — AB
Hgb urine dipstick: NEGATIVE
Ketones, ur: NEGATIVE mg/dL
Nitrite: NEGATIVE
Protein, ur: NEGATIVE mg/dL
Specific Gravity, Urine: 1.029 (ref 1.005–1.030)
pH: 5 (ref 5.0–8.0)

## 2022-03-27 NOTE — ED Provider Triage Note (Signed)
Emergency Medicine Provider Triage Evaluation Note ? ?Jacqueline Ball , a 81 y.o. female  was evaluated in triage.  Pt complains of dysuria and rectal pain. ?She recently had a UTI and states she completed all of her treatment and felt better until a few days ago when it restarted.  She denies any fevers or flank pain.  She states that she does not normally have any rectal pain when she has a UTI and that this is a new concern.  She states "I do not do anything back there." ? ? ? ?Physical Exam  ?BP 138/81 (BP Location: Right Arm)   Pulse 63   Temp 98.3 ?F (36.8 ?C)   Resp 16   Ht 5\' 4"  (1.626 m)   Wt 61.2 kg   SpO2 96%   BMI 23.17 kg/m?  ?Gen:   Awake, no distress   ?Resp:  Normal effort  ?MSK:   Moves extremities without difficulty  ?Other:  Rectal exam deferred due to triage setting.  ? ?Medical Decision Making  ?Medically screening exam initiated at 9:19 PM.  Appropriate orders placed.  Jacqueline Ball was informed that the remainder of the evaluation will be completed by another provider, this initial triage assessment does not replace that evaluation, and the importance of remaining in the ED until their evaluation is complete. ? ?Note: Portions of this report may have been transcribed using voice recognition software. Every effort was made to ensure accuracy; however, inadvertent computerized transcription errors may be present ? ?  ?Lorin Glass, PA-C ?03/27/22 2120 ? ?

## 2022-03-27 NOTE — ED Notes (Signed)
Patient states she is leaving d/t wait time 

## 2022-03-27 NOTE — ED Triage Notes (Signed)
Pt having dysuria on and off for months. Burning sensation is worse this time. Denies blood in urine or back pain. Pt also c/o urgency and frequency. ?

## 2022-03-28 DIAGNOSIS — R4182 Altered mental status, unspecified: Secondary | ICD-10-CM | POA: Diagnosis not present

## 2022-03-28 DIAGNOSIS — M79671 Pain in right foot: Secondary | ICD-10-CM | POA: Diagnosis not present

## 2022-03-28 DIAGNOSIS — Z7984 Long term (current) use of oral hypoglycemic drugs: Secondary | ICD-10-CM | POA: Diagnosis not present

## 2022-03-28 DIAGNOSIS — M79672 Pain in left foot: Secondary | ICD-10-CM | POA: Diagnosis not present

## 2022-03-28 DIAGNOSIS — E1165 Type 2 diabetes mellitus with hyperglycemia: Secondary | ICD-10-CM | POA: Diagnosis not present

## 2022-03-28 DIAGNOSIS — K644 Residual hemorrhoidal skin tags: Secondary | ICD-10-CM | POA: Diagnosis not present

## 2022-03-29 LAB — URINE CULTURE: Culture: 20000 — AB

## 2022-03-30 ENCOUNTER — Emergency Department (HOSPITAL_COMMUNITY)
Admission: EM | Admit: 2022-03-30 | Discharge: 2022-03-31 | Disposition: A | Discharge status: Home / Self Care | Payer: Medicare HMO | Attending: Emergency Medicine | Admitting: Emergency Medicine

## 2022-03-30 ENCOUNTER — Encounter (HOSPITAL_COMMUNITY): Payer: Self-pay

## 2022-03-30 ENCOUNTER — Emergency Department (HOSPITAL_COMMUNITY)
Admission: EM | Admit: 2022-03-30 | Discharge: 2022-03-30 | Disposition: A | Discharge status: Home / Self Care | Payer: Medicare HMO | Source: Home / Self Care

## 2022-03-30 DIAGNOSIS — Z5321 Procedure and treatment not carried out due to patient leaving prior to being seen by health care provider: Secondary | ICD-10-CM | POA: Diagnosis not present

## 2022-03-30 DIAGNOSIS — Z7901 Long term (current) use of anticoagulants: Secondary | ICD-10-CM | POA: Diagnosis not present

## 2022-03-30 DIAGNOSIS — A6004 Herpesviral vulvovaginitis: Secondary | ICD-10-CM | POA: Diagnosis not present

## 2022-03-30 DIAGNOSIS — K6289 Other specified diseases of anus and rectum: Secondary | ICD-10-CM | POA: Insufficient documentation

## 2022-03-30 DIAGNOSIS — R4182 Altered mental status, unspecified: Secondary | ICD-10-CM | POA: Diagnosis not present

## 2022-03-30 DIAGNOSIS — N771 Vaginitis, vulvitis and vulvovaginitis in diseases classified elsewhere: Secondary | ICD-10-CM | POA: Diagnosis not present

## 2022-03-30 DIAGNOSIS — R29818 Other symptoms and signs involving the nervous system: Secondary | ICD-10-CM | POA: Diagnosis not present

## 2022-03-30 DIAGNOSIS — N9489 Other specified conditions associated with female genital organs and menstrual cycle: Secondary | ICD-10-CM | POA: Diagnosis not present

## 2022-03-30 NOTE — ED Provider Triage Note (Signed)
Emergency Medicine Provider Triage Evaluation Note ? ?Jacqueline Ball , a 81 y.o. female  was evaluated in triage.  Pt complains of vaginal and rectal irritation and burning pain. No discharge or diarrhea. No pain with defecation. No urinary complaints. ? ?Physical Exam  ?BP 116/71 (BP Location: Left Arm)   Pulse 80   Temp 98.3 ?F (36.8 ?C) (Oral)   Resp 18   SpO2 99%  ?Gen:   Awake, no distress   ?Resp:  Normal effort  ?MSK:   Moves extremities without difficulty  ?Other:   ? ?Medical Decision Making  ?Medically screening exam initiated at 7:03 PM.  Appropriate orders placed.  Jacqueline Ball was informed that the remainder of the evaluation will be completed by another provider, this initial triage assessment does not replace that evaluation, and the importance of remaining in the ED until their evaluation is complete. ?  ?Placido Sou, PA-C ?03/30/22 1904 ? ?

## 2022-03-30 NOTE — ED Triage Notes (Signed)
Pt arrived via POV, c/o vaginal burning and rectal pain for several months. Denies any discharge.  ?

## 2022-03-31 ENCOUNTER — Encounter (HOSPITAL_COMMUNITY): Payer: Self-pay | Admitting: Emergency Medicine

## 2022-03-31 ENCOUNTER — Other Ambulatory Visit: Payer: Self-pay

## 2022-03-31 ENCOUNTER — Emergency Department (HOSPITAL_COMMUNITY): Payer: Medicare HMO

## 2022-03-31 ENCOUNTER — Emergency Department (HOSPITAL_COMMUNITY)
Admission: EM | Admit: 2022-03-31 | Discharge: 2022-03-31 | Disposition: A | Discharge status: Home / Self Care | Payer: Medicare HMO | Source: Home / Self Care | Attending: Emergency Medicine | Admitting: Emergency Medicine

## 2022-03-31 DIAGNOSIS — Z7901 Long term (current) use of anticoagulants: Secondary | ICD-10-CM | POA: Insufficient documentation

## 2022-03-31 DIAGNOSIS — A6004 Herpesviral vulvovaginitis: Secondary | ICD-10-CM | POA: Insufficient documentation

## 2022-03-31 DIAGNOSIS — W01198A Fall on same level from slipping, tripping and stumbling with subsequent striking against other object, initial encounter: Secondary | ICD-10-CM | POA: Insufficient documentation

## 2022-03-31 DIAGNOSIS — N760 Acute vaginitis: Secondary | ICD-10-CM

## 2022-03-31 DIAGNOSIS — N771 Vaginitis, vulvitis and vulvovaginitis in diseases classified elsewhere: Secondary | ICD-10-CM | POA: Diagnosis not present

## 2022-03-31 DIAGNOSIS — R29818 Other symptoms and signs involving the nervous system: Secondary | ICD-10-CM | POA: Diagnosis not present

## 2022-03-31 LAB — RAPID URINE DRUG SCREEN, HOSP PERFORMED
Amphetamines: NOT DETECTED
Barbiturates: NOT DETECTED
Benzodiazepines: NOT DETECTED
Cocaine: NOT DETECTED
Opiates: NOT DETECTED
Tetrahydrocannabinol: NOT DETECTED

## 2022-03-31 LAB — CBC WITH DIFFERENTIAL/PLATELET
Abs Immature Granulocytes: 0.01 10*3/uL (ref 0.00–0.07)
Basophils Absolute: 0 10*3/uL (ref 0.0–0.1)
Basophils Relative: 0 %
Eosinophils Absolute: 0 10*3/uL (ref 0.0–0.5)
Eosinophils Relative: 0 %
HCT: 36.3 % (ref 36.0–46.0)
Hemoglobin: 11.8 g/dL — ABNORMAL LOW (ref 12.0–15.0)
Immature Granulocytes: 0 %
Lymphocytes Relative: 27 %
Lymphs Abs: 1.7 10*3/uL (ref 0.7–4.0)
MCH: 31.4 pg (ref 26.0–34.0)
MCHC: 32.5 g/dL (ref 30.0–36.0)
MCV: 96.5 fL (ref 80.0–100.0)
Monocytes Absolute: 0.4 10*3/uL (ref 0.1–1.0)
Monocytes Relative: 6 %
Neutro Abs: 4.4 10*3/uL (ref 1.7–7.7)
Neutrophils Relative %: 67 %
Platelets: 329 10*3/uL (ref 150–400)
RBC: 3.76 MIL/uL — ABNORMAL LOW (ref 3.87–5.11)
RDW: 12.7 % (ref 11.5–15.5)
WBC: 6.6 10*3/uL (ref 4.0–10.5)
nRBC: 0 % (ref 0.0–0.2)

## 2022-03-31 LAB — URINALYSIS, ROUTINE W REFLEX MICROSCOPIC
Bilirubin Urine: NEGATIVE
Glucose, UA: >=500 mg/dL — AB
Hgb urine dipstick: NEGATIVE
Ketones, ur: NEGATIVE mg/dL
Nitrite: NEGATIVE
Protein, ur: NEGATIVE mg/dL
Specific Gravity, Urine: 1.029 (ref 1.005–1.030)
pH: 5 (ref 5.0–8.0)

## 2022-03-31 LAB — HEPATIC FUNCTION PANEL
ALT: 13 U/L (ref 0–44)
AST: 16 U/L (ref 15–41)
Albumin: 3.4 g/dL — ABNORMAL LOW (ref 3.5–5.0)
Alkaline Phosphatase: 88 U/L (ref 38–126)
Bilirubin, Direct: <0.1 mg/dL (ref 0.0–0.2)
Total Bilirubin: 0.6 mg/dL (ref 0.3–1.2)
Total Protein: 6.6 g/dL (ref 6.5–8.1)

## 2022-03-31 LAB — BASIC METABOLIC PANEL
Anion gap: 8 (ref 5–15)
BUN: 5 mg/dL — ABNORMAL LOW (ref 8–23)
CO2: 21 mmol/L — ABNORMAL LOW (ref 22–32)
Calcium: 9 mg/dL (ref 8.9–10.3)
Chloride: 106 mmol/L (ref 98–111)
Creatinine, Ser: 0.83 mg/dL (ref 0.44–1.00)
GFR, Estimated: >60 mL/min (ref >60)
Glucose, Bld: 299 mg/dL — ABNORMAL HIGH (ref 70–99)
Potassium: 3.5 mmol/L (ref 3.5–5.1)
Sodium: 135 mmol/L (ref 135–145)

## 2022-03-31 LAB — HIV ANTIBODY (ROUTINE TESTING W REFLEX): HIV Screen 4th Generation wRfx: NONREACTIVE

## 2022-03-31 LAB — WET PREP, GENITAL
Sperm: NONE SEEN
Trich, Wet Prep: NONE SEEN
WBC, Wet Prep HPF POC: >=10 — AB (ref <10)
Yeast Wet Prep HPF POC: NONE SEEN

## 2022-03-31 LAB — ETHANOL: Alcohol, Ethyl (B): <10 mg/dL (ref <10)

## 2022-03-31 MED ORDER — METRONIDAZOLE 500 MG PO TABS
500.0000 mg | ORAL_TABLET | Freq: Two times a day (BID) | ORAL | 0 refills | Status: DC
Start: 2022-03-31 — End: 2023-05-29

## 2022-03-31 MED ORDER — VALACYCLOVIR HCL 1 G PO TABS: 1000.0000 mg | ORAL_TABLET | Freq: Two times a day (BID) | ORAL | 0 refills | Status: AC

## 2022-03-31 NOTE — Discharge Instructions (Addendum)
Recommend taking the course of antiviral medicines for suspected genital herpes.  Additionally recommend taking course of Flagyl for suspected bacterial vaginosis.  Do not drink any alcohol while taking this medication.   ? ?Follow-up with your primary care doctor.   ? ?Follow safe sex practices. ? ?Come back to ER as needed for other new concerning symptoms. ?

## 2022-03-31 NOTE — ED Provider Notes (Signed)
?Jacqueline Ball ?Provider Note ? ? ?CSN: [id number] ?Arrival date & time: 03/31/22  1230 ? ?  ? ?History ? ?Chief Complaint  ?Patient presents with  ? Fall  ? Abdominal Pain  ? ? ?Jacqueline Ball is a 81 y.o. female.  Presented to the emergency department with multiple different concerns.  States that she fell on Monday, hit her head.  States that she did not pass out during the fall.  States that the reason why she came to the ER today was pain in her uterus.  States that she is having pain down the lower pelvic region.  She denies any abdominal pain at present.  She denies feeling confused.  She states that she was seen at [facility] on Wednesday.  She denies vaginal bleeding, no vaginal discharge. ? ?Reviewed outside records, patient did have ER visit on 4/12 at [facility].  Her complaint at that time was bilateral foot pain and urinary frequency.  Rectal pain for a few days. ? ?HPI ? ?  ? ?Home Medications ?Prior to Admission medications   ?Medication Sig Start Date End Date Taking? Authorizing Provider  ?valACYclovir (VALTREX) 1000 MG tablet Take 1 tablet (1,000 mg total) by mouth 2 (two) times daily for 7 days. 03/31/22 04/07/22 Yes Razi Hickle, Ellwood Dense, MD  ?ACCU-CHEK GUIDE test strip USE AS DIRECTED UP TO THREE TIMES DAILY 01/16/21   [provider]  ?acetaminophen (TYLENOL) 500 MG tablet Take 500 mg by mouth every 6 (six) hours as needed for pain.    [provider]  ?apixaban (ELIQUIS) 5 MG TABS tablet Take 1 tablet (5 mg total) by mouth 2 (two) times daily. 11/24/14   Charolette Forward, MD  ?atenolol (TENORMIN) 50 MG tablet Take 50 mg by mouth daily. 07/29/19   [provider]  ?atorvastatin (LIPITOR) 40 MG tablet Take 40 mg by mouth at bedtime.    [provider]  ?azelastine (ASTELIN) 0.1 % nasal spray Place 1 spray into both nostrils as needed for rhinitis or allergies.  05/24/18   [provider]  ?Blood Glucose  Monitoring Suppl (ACCU-CHEK GUIDE) w/Device KIT USE AS DIRECTED UP TO THREE TIMES DAILY 01/16/21   [provider]  ?cyclobenzaprine (FLEXERIL) 5 MG tablet Take 1-2 tablets (5-10 mg total) by mouth at bedtime. 06/06/20   Wieters, Hallie C, PA-C  ?diclofenac Sodium (VOLTAREN) 1 % GEL Apply one application (2g) up to four times daily as needed for pain. 12/08/21   Crain, Loree Fee L, PA  ?dicyclomine (BENTYL) 20 MG tablet Take 20 mg by mouth 3 (three) times daily as needed for spasms. 08/29/18   [provider]  ?donepezil (ARICEPT) 10 MG tablet Take 1 tablet (10 mg total) by mouth at bedtime. 06/06/17   Rosalin Hawking, MD  ?esomeprazole (NEXIUM) 40 MG capsule Take 40 mg by mouth daily at 12 noon.    [provider]  ?estradiol (CLIMARA) 0.06 MG/24HR Place 0.06 patches onto the skin once a week. 08/12/17   [provider]  ?fluconazole (DIFLUCAN) 150 MG tablet fluconazole 150 mg tablet    [provider]  ?fluticasone (FLONASE) 50 MCG/ACT nasal spray Place 1 spray into both nostrils daily. 09/15/18   Loura Halt A, NP  ?glimepiride (AMARYL) 4 MG tablet Take 4 mg by mouth daily with breakfast.    [provider]  ?losartan (COZAAR) 25 MG tablet Take 25 mg by mouth daily. 05/03/20   [provider]  ?metFORMIN (GLUCOPHAGE)  500 MG tablet Take 500 mg by mouth 2 (two) times daily with a meal.  08/23/15   [provider]  ?MYRBETRIQ 50 MG TB24 tablet Take 50 mg by mouth daily. 07/29/19   [provider]  ?nystatin cream (MYCOSTATIN) Apply to affected area 2 times daily 04/02/20   Loura Halt A, NP  ?omeprazole (PRILOSEC) 40 MG capsule Take 40 mg by mouth daily. 11/27/19   [provider]  ?pantoprazole (PROTONIX) 20 MG tablet Take 1 tablet (20 mg total) by mouth daily. 02/24/20   Marney Setting, NP  ?rosuvastatin (CRESTOR) 10 MG tablet Take 10 mg by mouth at bedtime. 09/14/19   [provider]  ?sulfamethoxazole-trimethoprim (BACTRIM DS)  800-160 MG tablet Take 1 tablet by mouth 2 (two) times daily. 03/12/20   Jaynee Eagles, PA-C  ?traMADol (ULTRAM) 50 MG tablet Take 1 tablet (50 mg total) by mouth 2 (two) times daily as needed for severe pain. for pain 11/23/21   Antonietta Breach, PA-C  ?VESICARE 10 MG tablet Take 10 mg by mouth daily.  01/17/18   [provider]  ?   ? ?Allergies    ?Metoprolol   ? ?Review of Systems   ?Review of Systems  ?Constitutional:  Negative for chills and fever.  ?HENT:  Negative for ear pain and sore throat.   ?Eyes:  Negative for pain and visual disturbance.  ?Respiratory:  Negative for cough and shortness of breath.   ?Cardiovascular:  Negative for chest pain and palpitations.  ?Gastrointestinal:  Negative for abdominal pain and vomiting.  ?Genitourinary:  Positive for vaginal pain. Negative for dysuria and hematuria.  ?Musculoskeletal:  Negative for arthralgias and back pain.  ?Skin:  Negative for color change and rash.  ?Neurological:  Negative for seizures and syncope.  ?All other systems reviewed and are negative. ? ?Physical Exam ?Updated Vital Signs ?BP (!) 149/83   Pulse 69   Temp 98.6 ?F (37 ?C) (Oral)   Resp (!) 21   SpO2 99%  ?Physical Exam ?Vitals and nursing note reviewed.  ?Constitutional:   ?   General: She is not in acute distress. ?   Appearance: She is well-developed.  ?HENT:  ?   Head: Normocephalic and atraumatic.  ?Eyes:  ?   Conjunctiva/sclera: Conjunctivae normal.  ?Cardiovascular:  ?   Rate and Rhythm: Normal rate and regular rhythm.  ?   Heart sounds: No murmur heard. ?Pulmonary:  ?   Effort: Pulmonary effort is normal. No respiratory distress.  ?   Breath sounds: Normal breath sounds.  ?Abdominal:  ?   Palpations: Abdomen is soft.  ?   Tenderness: There is no abdominal tenderness.  ?Musculoskeletal:     ?   General: No swelling.  ?   Cervical back: Neck supple.  ?Skin: ?   General: Skin is warm and dry.  ?   Capillary Refill: Capillary refill takes less than 2 seconds.  ?Neurological:  ?    Mental Status: She is alert.  ?   Comments: Alert, oriented to person place time and situation, answering questions appropriately  ?Psychiatric:     ?   Mood and Affect: Mood normal.  ? ? ?ED Results / Procedures / Treatments   ?Labs ?(all labs ordered are listed, but only abnormal results are displayed) ?Labs Reviewed  ?BASIC METABOLIC PANEL - Abnormal; Notable for the following components:  ?    Result Value  ? CO2 21 (*)   ? Glucose, Bld 299 (*)   ?  BUN 5 (*)   ? All other components within normal limits  ?CBC WITH DIFFERENTIAL/PLATELET - Abnormal; Notable for the following components:  ? RBC 3.76 (*)   ? Hemoglobin 11.8 (*)   ? All other components within normal limits  ?URINALYSIS, ROUTINE W REFLEX MICROSCOPIC - Abnormal; Notable for the following components:  ? APPearance HAZY (*)   ? Glucose, UA >=500 (*)   ? Leukocytes,Ua TRACE (*)   ? Bacteria, UA RARE (*)   ? All other components within normal limits  ?HEPATIC FUNCTION PANEL - Abnormal; Notable for the following components:  ? Albumin 3.4 (*)   ? All other components within normal limits  ?WET PREP, GENITAL  ?ETHANOL  ?RAPID URINE DRUG SCREEN, HOSP PERFORMED  ?RPR  ?HIV ANTIBODY (ROUTINE TESTING W REFLEX)  ?GC/CHLAMYDIA PROBE AMP ([place]) NOT AT [facility]  ? ? ?EKG ?None ? ?Radiology ?CT Head Wo Contrast ? ?Result Date: 03/31/2022 ?CLINICAL DATA:  Neuro deficit, assess for stroke. EXAM: CT HEAD WITHOUT CONTRAST TECHNIQUE: Contiguous axial images were obtained from the base of the skull through the vertex without intravenous contrast. RADIATION DOSE REDUCTION: This exam was performed according to the departmental dose-optimization program which includes automated exposure control, adjustment of the mA and/or kV according to patient size and/or use of iterative reconstruction technique. COMPARISON:  March 28, 2022 FINDINGS: Brain: No evidence of acute infarction, hemorrhage, hydrocephalus, extra-axial collection or mass lesion/mass effect. Chronic diffuse  atrophy noted. Vascular: No hyperdense vessel is identified. Skull: Normal. Negative for fracture or focal lesion. Sinuses/Orbits: Mucoperiosteal thickening of bilateral ethmoid sinus and right maxillary sinus are noted.

## 2022-03-31 NOTE — ED Provider Triage Note (Signed)
Emergency Medicine Provider Triage Evaluation Note ? ?Jacqueline Ball , a 81 y.o. female  was evaluated in triage.  Pt complains of Confusion after a fall that occurred on Monday.  She says she hit her head and "lost her memory."  Is not on blood thinners.  Is unsure how she fell.  When asked why she fell she said "well maybe I waited too long."  Each question she answers with "I do not know" ? ?Review of Systems  ?AMS.  She was asked ?1.  What made you fall ?2 do drink any alcohol ?3 you have any visual disturbance ?4 do you have any weakness ?ETC, patient says she does not know for ? ?Physical Exam  ?BP 129/77 (BP Location: Right Arm)   Pulse 76   Temp 98.6 ?F (37 ?C) (Oral)   Resp 16   SpO2 100%  ?Gen:   Awake, no distress   ?Resp:  Normal effort  ?MSK:   Moves extremities without difficulty  ?Other:  Pupils equal, constricted.  Potentially previous cataract surgery on left eye ? ?Medical Decision Making  ?Medically screening exam initiated at 12:54 PM.  Appropriate orders placed.  Josely Moffat was informed that the remainder of the evaluation will be completed by another provider, this initial triage assessment does not replace that evaluation, and the importance of remaining in the ED until their evaluation is complete. ? ?Per chart review, patient was seen at [facility] long yesterday for vaginal and rectal problems and did not mention ths fall. ? ? ?  ?Saddie Benders, PA-C ?03/31/22 1256 ? ?

## 2022-03-31 NOTE — ED Triage Notes (Signed)
Pt states she fell on Monday or Tuesday.  Reports pain to forehead and abd.  Pt unsure if she passed out and unsure what caused her to fall.  Pt confused. ?

## 2022-03-31 NOTE — ED Notes (Signed)
Pt's son called and discussed pt's discharge info over the phone at request of pt. No questions voiced at this time. Pt discharged to lobby to wait for ride.  ?

## 2022-04-01 LAB — RPR: RPR Ser Ql: NONREACTIVE

## 2022-04-02 LAB — GC/CHLAMYDIA PROBE AMP (~~LOC~~) NOT AT ARMC
Chlamydia: NEGATIVE
Comment: NEGATIVE
Comment: NORMAL
Neisseria Gonorrhea: NEGATIVE

## 2022-04-04 DIAGNOSIS — N898 Other specified noninflammatory disorders of vagina: Secondary | ICD-10-CM | POA: Diagnosis not present

## 2022-04-04 DIAGNOSIS — R102 Pelvic and perineal pain: Secondary | ICD-10-CM | POA: Diagnosis not present

## 2022-04-06 DIAGNOSIS — Z113 Encounter for screening for infections with a predominantly sexual mode of transmission: Secondary | ICD-10-CM | POA: Diagnosis not present

## 2022-04-06 DIAGNOSIS — N760 Acute vaginitis: Secondary | ICD-10-CM | POA: Diagnosis not present

## 2022-04-10 ENCOUNTER — Ambulatory Visit (HOSPITAL_COMMUNITY)
Admission: EM | Admit: 2022-04-10 | Discharge: 2022-04-10 | Disposition: A | Discharge status: Home / Self Care | Payer: Medicare HMO

## 2022-04-10 ENCOUNTER — Encounter (HOSPITAL_COMMUNITY): Payer: Self-pay

## 2022-04-10 DIAGNOSIS — M722 Plantar fascial fibromatosis: Secondary | ICD-10-CM

## 2022-04-10 NOTE — ED Triage Notes (Signed)
Pt presents today with bilateral pain on her plantar region.   ?

## 2022-04-10 NOTE — ED Provider Notes (Signed)
[facility]    CSN: [id number] Arrival date & time: 04/10/22  1658      History   Chief Complaint Chief Complaint  Patient presents with   Foot Pain    HPI Jacqueline Ball is a 81 y.o. female.   Pt complains of bilateral foot pain.  Reports pain is on the bottom of both feet.  Pain is worse first thing in the morning and after she has been on her feet.  She denies injury or trauma.  She has tried nothing for the pain.     Past Medical History:  Diagnosis Date   Anxiety    Atrial fibrillation (HCC)    Diabetes mellitus    Hyperlipidemia    Hypertension    Memory loss    Mood disorder [facility])    Physical exam, annual 10/22/06   Renal cyst     Patient Active Problem List   Diagnosis Date Noted   Acute midline low back pain without sciatica 11/29/2019   GERD (gastroesophageal reflux disease) 05/12/2019   Arthritis 05/12/2019   Dementia (HCC) 05/12/2019   Hyperlipidemia 05/12/2019   Overactive bladder 05/12/2019   Chronic anticoagulation 03/18/2018   Late onset Alzheimer's 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      Home Medications    Prior to Admission medications   Medication Sig Start Date End Date Taking? Authorizing Provider  ACCU-CHEK GUIDE test strip USE AS DIRECTED UP TO THREE TIMES DAILY 01/16/21   [provider]  acetaminophen (TYLENOL) 500  MG tablet Take 500 mg by mouth every 6 (six) hours as needed for pain.    [provider]  apixaban (ELIQUIS) 5 MG TABS tablet Take 1 tablet (5 mg total) by mouth 2 (two) times daily. 11/24/14   Rinaldo Cloud, MD  atenolol (TENORMIN) 50 MG tablet Take 50 mg by mouth daily. 07/29/19   [provider]  atorvastatin (LIPITOR) 40 MG tablet Take 40 mg by mouth at bedtime.    [provider]  azelastine (ASTELIN) 0.1 % nasal spray Place 1 spray into both nostrils as needed for rhinitis or allergies.  05/24/18   [provider]  Blood Glucose Monitoring Suppl (ACCU-CHEK GUIDE) w/Device KIT USE AS DIRECTED UP TO THREE TIMES DAILY 01/16/21   [provider]  cyclobenzaprine (FLEXERIL) 5 MG tablet Take 1-2 tablets (5-10 mg total) by mouth at bedtime. 06/06/20   Wieters, Hallie C, PA-C  diclofenac Sodium (VOLTAREN) 1 % GEL Apply one application (2g) up to four times daily as needed for pain. 12/08/21   Crain, Whitney L, PA  dicyclomine (BENTYL) 20 MG tablet Take 20 mg by mouth 3 (three) times daily as needed for spasms. 08/29/18   [provider]  donepezil (ARICEPT) 10 MG tablet Take 1 tablet (10 mg total) by mouth at bedtime. 06/06/17   Marvel Plan, MD  esomeprazole (NEXIUM) 40 MG capsule Take 40 mg by mouth  daily at 12 noon.    [provider]  estradiol (CLIMARA) 0.06 MG/24HR Place 0.06 patches onto the skin once a week. 08/12/17   [provider]  fluconazole (DIFLUCAN) 150 MG tablet fluconazole 150 mg tablet    [provider]  fluticasone (FLONASE) 50 MCG/ACT nasal spray Place 1 spray into both nostrils daily. 09/15/18   Bast, Gloris Manchester A, NP  glimepiride (AMARYL) 4 MG tablet Take 4 mg by mouth daily with breakfast.    [provider]  losartan (COZAAR) 25 MG tablet Take 25 mg by mouth daily. 05/03/20   [provider]  metFORMIN (GLUCOPHAGE) 500 MG tablet Take 500 mg by mouth 2 (two) times daily with a meal.  08/23/15    [provider]  metroNIDAZOLE (FLAGYL) 500 MG tablet Take 1 tablet (500 mg total) by mouth 2 (two) times daily. 03/31/22   Milagros Loll, MD  MYRBETRIQ 50 MG TB24 tablet Take 50 mg by mouth daily. 07/29/19   [provider]  nystatin cream (MYCOSTATIN) Apply to affected area 2 times daily 04/02/20   Dahlia Byes A, NP  omeprazole (PRILOSEC) 40 MG capsule Take 40 mg by mouth daily. 11/27/19   [provider]  pantoprazole (PROTONIX) 20 MG tablet Take 1 tablet (20 mg total) by mouth daily. 02/24/20   Coralyn Mark, NP  rosuvastatin (CRESTOR) 10 MG tablet Take 10 mg by mouth at bedtime. 09/14/19   [provider]  sulfamethoxazole-trimethoprim (BACTRIM DS) 800-160 MG tablet Take 1 tablet by mouth 2 (two) times daily. 03/12/20   Wallis Bamberg, PA-C  traMADol (ULTRAM) 50 MG tablet Take 1 tablet (50 mg total) by mouth 2 (two) times daily as needed for severe pain. for pain 11/23/21   Antony Madura, PA-C  VESICARE 10 MG tablet Take 10 mg by mouth daily.  01/17/18   [provider]    Family History Family History  Problem Relation Age of Onset   Diabetes Sister     Social History Social History   Tobacco Use   Smoking status: Never   Smokeless tobacco: Never  Vaping Use   Vaping Use: Never used  Substance Use Topics   Alcohol use: No   Drug use: No     Allergies   Metoprolol   Review of Systems Review of Systems  Constitutional:  Negative for chills and fever.  HENT:  Negative for ear pain and sore throat.   Eyes:  Negative for pain and visual disturbance.  Respiratory:  Negative for cough and shortness of breath.   Cardiovascular:  Negative for chest pain and palpitations.  Gastrointestinal:  Negative for abdominal pain and vomiting.  Genitourinary:  Negative for dysuria and hematuria.  Musculoskeletal:  Negative for back pain.       Bilateral foot pain  Skin:  Negative for color change and rash.  Neurological:  Negative for  seizures and syncope.  All other systems reviewed and are negative.   Physical Exam Triage Vital Signs ED Triage Vitals  Enc Vitals Group     BP 04/10/22 1750 118/65     Pulse Rate 04/10/22 1750 72     Resp 04/10/22 1750 18     Temp 04/10/22 1750 (!) 97.4 F (36.3 C)     Temp src --      SpO2 04/10/22 1750 100 %     Weight --      Height --      Head Circumference --  Peak Flow --      Pain Score 04/10/22 1749 4     Pain Loc --      Pain Edu? --      Excl. in [place]? --    No data found.  Updated Vital Signs BP 118/65 (BP Location: Left Arm)   Pulse 72   Temp (!) 97.4 F (36.3 C)   Resp 18   SpO2 100%   Visual Acuity Right Eye Distance:   Left Eye Distance:   Bilateral Distance:    Right Eye Near:   Left Eye Near:    Bilateral Near:     Physical Exam Vitals and nursing note reviewed.  Constitutional:      General: She is not in acute distress.    Appearance: She is well-developed.  HENT:     Head: Normocephalic and atraumatic.  Eyes:     Conjunctiva/sclera: Conjunctivae normal.  Cardiovascular:     Rate and Rhythm: Normal rate and regular rhythm.     Heart sounds: No murmur heard. Pulmonary:     Effort: Pulmonary effort is normal. No respiratory distress.     Breath sounds: Normal breath sounds.  Abdominal:     Palpations: Abdomen is soft.     Tenderness: There is no abdominal tenderness.  Musculoskeletal:        General: No swelling.     Cervical back: Neck supple.       Feet:  Skin:    General: Skin is warm and dry.     Capillary Refill: Capillary refill takes less than 2 seconds.  Neurological:     Mental Status: She is alert.  Psychiatric:        Mood and Affect: Mood normal.     UC Treatments / Results  Labs (all labs ordered are listed, but only abnormal results are displayed) Labs Reviewed - No data to display  EKG   Radiology No results found.  Procedures Procedures (including critical care time)  Medications Ordered in  UC Medications - No data to display  Initial Impression / Assessment and Plan / UC Course  I have reviewed the triage vital signs and the nursing notes.  Pertinent labs & imaging results that were available during my care of the patient were reviewed by me and considered in my medical decision making (see chart for details).     Plantar fasciitis. Stretching, ice, rest discussed.  Return precautions discussed.  Final Clinical Impressions(s) / UC Diagnoses   Final diagnoses:  Bilateral plantar fasciitis     Discharge Instructions      Recommend daily stretching Can apply ice If no improvement follow up with primary care physician or orthopedics.    ED Prescriptions   None    PDMP not reviewed this encounter.   Ward, Tylene Fantasia, PA-C 04/10/22 2001

## 2022-04-10 NOTE — Discharge Instructions (Signed)
Recommend daily stretching ?Can apply ice ?If no improvement follow up with primary care physician or orthopedics.  ?

## 2022-04-11 ENCOUNTER — Ambulatory Visit: Payer: Medicare HMO | Admitting: Podiatry

## 2022-04-12 ENCOUNTER — Ambulatory Visit (INDEPENDENT_AMBULATORY_CARE_PROVIDER_SITE_OTHER): Payer: Medicare HMO | Admitting: Nurse Practitioner

## 2022-04-12 ENCOUNTER — Encounter: Payer: Self-pay | Admitting: Nurse Practitioner

## 2022-04-12 VITALS — BP 122/82 | HR 62 | Temp 97.2°F | Resp 14 | Ht 64.8 in | Wt 124.2 lb

## 2022-04-12 DIAGNOSIS — R202 Paresthesia of skin: Secondary | ICD-10-CM

## 2022-04-13 ENCOUNTER — Ambulatory Visit: Payer: Medicare HMO | Admitting: Podiatry

## 2022-04-13 ENCOUNTER — Encounter (HOSPITAL_COMMUNITY): Payer: Self-pay

## 2022-04-13 NOTE — Progress Notes (Cosign Needed)
Subjective:  ? ? Jacqueline Ball is a 81 y.o. female who presents with right foot pain. Onset of the symptoms was  yesterday . Precipitating event:  fall from her bed where her foot  . Current symptoms include:  burning and pain to toes, and bottom of her foot . Aggravating factors:  none . Symptoms have gradually worsened. Patient has had prior foot problems. Evaluation to date:  from PCP with a diagnosis of peripheral neuropathy . Treatment to date:  Gabapentin which she reports she has not been taking . ? ?The following portions of the patient's history were reviewed and updated as appropriate: allergies, current medications, past medical history, past social history, and problem list. ? ?Review of Systems ?Respiratory: negative for cough and wheezing ?Cardiovascular: negative for chest pain and irregular heart beat ?Musculoskeletal:positive for none ?Neurological: positive for paresthesia  ?  ?Objective:  ? ? BP 122/82 (BP Location: Right Arm, Patient Position: Sitting)   Pulse 62   Temp (!) 97.2 ?F (36.2 ?C)   Resp 14   Ht 5' 4.8" (1.646 m)   Wt 124 lb 3.2 oz (56.3 kg)   SpO2 97%   BMI 20.80 kg/m?  ?Right foot:  normal exam, no swelling, tenderness, instability; ligaments intact, full range of motion of all ankle/foot joints and    ?Left foot:  normal exam, no swelling, tenderness, instability; ligaments intact, full range of motion of all ankle/foot joints  ? ?Imaging: ?X-ray of the right foot: not available  ?  ?Assessment:  ? ? Patient came in today complaining of foot pain to her right foot after a fall from bed last night. The description of the fall is unclear. She repots her feet were caught up in the blanket and then her foot began to hurt. She does have a history of DM and has been prescribed Gabapentin but does not take it or remember if she still has some. Patients pharmacy was called and medication record was obtained which indicated she did received a refill in February. In office testing for  numbness to her feet was done. She also has a history of Dementia.      ?  ?Plan:  ? ? Pt was instructed to take her prescribed gabapentin as prescribed and follow up with her PCP Dr. August Saucer.  ?

## 2022-04-17 DIAGNOSIS — E782 Mixed hyperlipidemia: Secondary | ICD-10-CM | POA: Diagnosis not present

## 2022-04-17 DIAGNOSIS — E1169 Type 2 diabetes mellitus with other specified complication: Secondary | ICD-10-CM | POA: Diagnosis not present

## 2022-04-17 DIAGNOSIS — I10 Essential (primary) hypertension: Secondary | ICD-10-CM | POA: Diagnosis not present

## 2022-04-20 DIAGNOSIS — E875 Hyperkalemia: Secondary | ICD-10-CM | POA: Diagnosis not present

## 2022-04-20 DIAGNOSIS — E785 Hyperlipidemia, unspecified: Secondary | ICD-10-CM | POA: Diagnosis not present

## 2022-04-20 DIAGNOSIS — E118 Type 2 diabetes mellitus with unspecified complications: Secondary | ICD-10-CM | POA: Diagnosis not present

## 2022-04-20 DIAGNOSIS — I10 Essential (primary) hypertension: Secondary | ICD-10-CM | POA: Diagnosis not present

## 2022-04-24 DIAGNOSIS — E875 Hyperkalemia: Secondary | ICD-10-CM | POA: Diagnosis not present

## 2022-04-24 DIAGNOSIS — R739 Hyperglycemia, unspecified: Secondary | ICD-10-CM | POA: Diagnosis not present

## 2022-05-07 ENCOUNTER — Ambulatory Visit: Payer: Medicare HMO | Admitting: Podiatrist

## 2022-05-09 ENCOUNTER — Encounter (HOSPITAL_COMMUNITY): Payer: Self-pay

## 2022-05-09 ENCOUNTER — Ambulatory Visit (HOSPITAL_COMMUNITY)
Admission: EM | Admit: 2022-05-09 | Discharge: 2022-05-09 | Disposition: A | Discharge status: Home / Self Care | Payer: Medicare HMO | Attending: Family Medicine | Admitting: Family Medicine

## 2022-05-09 ENCOUNTER — Ambulatory Visit (INDEPENDENT_AMBULATORY_CARE_PROVIDER_SITE_OTHER): Payer: Medicare HMO

## 2022-05-09 DIAGNOSIS — M79672 Pain in left foot: Secondary | ICD-10-CM

## 2022-05-09 MED ORDER — MUPIROCIN 2 % EX OINT: 1.0000 "application " | TOPICAL_OINTMENT | Freq: Two times a day (BID) | CUTANEOUS | 0 refills | Status: DC

## 2022-05-09 NOTE — ED Provider Notes (Signed)
[facility]    CSN: [id number] Arrival date & time: 05/09/22  1551      History   Chief Complaint Chief Complaint  Patient presents with   Foot Pain    HPI Jacqueline Ball is a 81 y.o. female.    Foot Pain  Here for soreness/pain in her left heel. States going on for "awhile". The skin is also dry over this foot. No fever and no drainage.  Was seen 4/25 for bilateral heel pain.  PMH: DM, Afib, on eliquis.  Past Medical History:  Diagnosis Date   Anxiety    Atrial fibrillation ([place])    Diabetes mellitus    Hyperlipidemia    Hypertension    Memory loss    Mood disorder [facility])    Physical exam, annual 10/22/06   Renal cyst     Patient Active Problem List   Diagnosis Date Noted   Acute midline low back pain without sciatica 11/29/2019   GERD (gastroesophageal reflux disease) 05/12/2019   Arthritis 05/12/2019   Dementia ([place]) 05/12/2019   Hyperlipidemia 05/12/2019   Overactive bladder 05/12/2019   Chronic anticoagulation 03/18/2018   Late onset Alzheimer's disease without behavioral disturbance ([place]) 01/21/2017   Chronic atrial fibrillation ([place]) 11/02/2015   Essential hypertension 11/02/2015   Type 2 diabetes mellitus without complication, without long-term current use of insulin ([place]) 11/02/2015   Cognitive impairment 10/31/2015   Chest pain 11/22/2014   Muscle cramps 04/17/2012   Bilateral shoulder pain 03/09/2012   Essential hypertension 03/28/2011   Diabetes mellitus type 2, noninsulin dependent ([place]) 03/28/2011   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      Home Medications    Prior to Admission medications   Medication Sig Start Date End Date Taking? Authorizing Provider  mupirocin ointment (BACTROBAN) 2 % Apply 1 application. topically 2 (two) times daily. To affected area till better 05/09/22  Yes Shamekia Tippets,  Gwenlyn Perking, MD  ACCU-CHEK GUIDE test strip USE AS DIRECTED UP TO THREE TIMES DAILY 01/16/21   [provider]  acetaminophen (TYLENOL) 500 MG tablet Take 500 mg by mouth every 6 (six) hours as needed for pain.    [provider]  apixaban (ELIQUIS) 5 MG TABS tablet Take 1 tablet (5 mg total) by mouth 2 (two) times daily. 11/24/14   Charolette Forward, MD  atenolol (TENORMIN) 50 MG tablet Take 50 mg by mouth daily. 07/29/19   [provider]  atorvastatin (LIPITOR) 40 MG tablet Take 40 mg by mouth at bedtime.    [provider]  azelastine (ASTELIN) 0.1 % nasal spray Place 1 spray into both nostrils as needed for rhinitis or allergies.  05/24/18   [provider]  Blood Glucose Monitoring Suppl (ACCU-CHEK GUIDE) w/Device KIT USE AS DIRECTED UP TO THREE TIMES DAILY 01/16/21   [provider]  cyclobenzaprine (FLEXERIL) 5 MG tablet Take 1-2 tablets (5-10 mg total) by mouth at bedtime. 06/06/20   Wieters, Hallie C, PA-C  diclofenac Sodium (VOLTAREN) 1 % GEL Apply one application (2g) up to four times daily as needed for pain. 12/08/21   Crain, Whitney L, PA  dicyclomine (BENTYL) 20 MG tablet Take 20 mg by mouth 3 (three) times daily as needed for spasms. 08/29/18   [provider]  donepezil (ARICEPT) 10 MG tablet Take 1 tablet (10 mg total) by mouth  at bedtime. 06/06/17   Rosalin Hawking, MD  esomeprazole (NEXIUM) 40 MG capsule Take 40 mg by mouth daily at 12 noon.    [provider]  estradiol (CLIMARA) 0.06 MG/24HR Place 0.06 patches onto the skin once a week. 08/12/17   [provider]  fluconazole (DIFLUCAN) 150 MG tablet fluconazole 150 mg tablet    [provider]  fluticasone (FLONASE) 50 MCG/ACT nasal spray Place 1 spray into both nostrils daily. 09/15/18   Bast, Tressia Miners A, NP  glimepiride (AMARYL) 4 MG tablet Take 4 mg by mouth daily with breakfast.    [provider]  losartan (COZAAR) 25 MG tablet Take 25 mg by mouth  daily. 05/03/20   [provider]  metFORMIN (GLUCOPHAGE) 500 MG tablet Take 500 mg by mouth 2 (two) times daily with a meal.  08/23/15   [provider]  metroNIDAZOLE (FLAGYL) 500 MG tablet Take 1 tablet (500 mg total) by mouth 2 (two) times daily. 03/31/22   Lucrezia Starch, MD  MYRBETRIQ 50 MG TB24 tablet Take 50 mg by mouth daily. 07/29/19   [provider]  nystatin cream (MYCOSTATIN) Apply to affected area 2 times daily 04/02/20   Loura Halt A, NP  omeprazole (PRILOSEC) 40 MG capsule Take 40 mg by mouth daily. 11/27/19   [provider]  pantoprazole (PROTONIX) 20 MG tablet Take 1 tablet (20 mg total) by mouth daily. 02/24/20   Marney Setting, NP  rosuvastatin (CRESTOR) 10 MG tablet Take 10 mg by mouth at bedtime. 09/14/19   [provider]  sulfamethoxazole-trimethoprim (BACTRIM DS) 800-160 MG tablet Take 1 tablet by mouth 2 (two) times daily. 03/12/20   Jaynee Eagles, PA-C  traMADol (ULTRAM) 50 MG tablet Take 1 tablet (50 mg total) by mouth 2 (two) times daily as needed for severe pain. for pain 11/23/21   Antonietta Breach, PA-C  VESICARE 10 MG tablet Take 10 mg by mouth daily.  01/17/18   [provider]    Family History Family History  Problem Relation Age of Onset   Diabetes Sister     Social History Social History   Tobacco Use   Smoking status: Never   Smokeless tobacco: Never  Vaping Use   Vaping Use: Never used  Substance Use Topics   Alcohol use: Never   Drug use: Never     Allergies   Metoprolol   Review of Systems Review of Systems   Physical Exam Triage Vital Signs ED Triage Vitals [05/09/22 1701]  Enc Vitals Group     BP (!) 146/92     Pulse Rate 73     Resp 16     Temp 99 F (37.2 C)     Temp Source Oral     SpO2 96 %     Weight      Height      Head Circumference      Peak Flow      Pain Score      Pain Loc      Pain Edu?      Excl. in [place]?    No data found.  Updated Vital Signs BP (!)  146/92 (BP Location: Left Arm)   Pulse 73   Temp 99 F (37.2 C) (Oral)   Resp 16   SpO2 96%   Visual Acuity Right Eye Distance:   Left Eye Distance:   Bilateral Distance:    Right Eye Near:   Left Eye Near:  Bilateral Near:     Physical Exam Vitals reviewed.  Constitutional:      General: She is not in acute distress.    Appearance: She is not toxic-appearing.  Musculoskeletal:     Comments: Sole of left anterior heel is mildly tender. No edema. There is some peeling dryness to the skin, with some small excoriated areas, about 3 in number, about 1-2 mm diameter. No sign of secondary infection.  Skin:    Coloration: Skin is not pale.  Neurological:     Mental Status: She is alert.     UC Treatments / Results  Labs (all labs ordered are listed, but only abnormal results are displayed) Labs Reviewed - No data to display  EKG   Radiology DG Foot Complete Left  Result Date: 05/09/2022 CLINICAL DATA:  Left foot pain. EXAM: LEFT FOOT - COMPLETE 3+ VIEW COMPARISON:  None Available. FINDINGS: There is no evidence of fracture or dislocation. Second through fifth hammertoe deformities. There is no evidence of arthropathy or other focal bone abnormality. Soft tissues are unremarkable. IMPRESSION: 1. No acute findings. 2. Second through fifth hammertoes. Electronically Signed   By: Kerby Moors M.D.   On: 05/09/2022 17:31    Procedures Procedures (including critical care time)  Medications Ordered in UC Medications - No data to display  Initial Impression / Assessment and Plan / UC Course  I have reviewed the triage vital signs and the nursing notes.  Pertinent labs & imaging results that were available during my care of the patient were reviewed by me and considered in my medical decision making (see chart for details).    X-ray is negative for any problem of the heel.  She does have some hammertoes.  I will provide her a prescription for mupirocin for the open areas,  and asked her to use petroleum jelly on her heel.  Also she should see her primary care physician  Final Clinical Impressions(s) / UC Diagnoses   Final diagnoses:  Pain of left heel     Discharge Instructions      Your x-ray was negative for any problem on the heel.  Apply mupirocin antibiotic ointment to the open areas on your heel to prevent infection.  You can also apply petroleum jelly to the heel for the dry skin.  Your heel pain persists you should see your primary care physician or you can go see a podiatrist.  Contact information is provided       ED Prescriptions     Medication Sig Dispense Auth. Provider   mupirocin ointment (BACTROBAN) 2 % Apply 1 application. topically 2 (two) times daily. To affected area till better 22 g Windy Carina Gwenlyn Perking, MD      PDMP not reviewed this encounter.   Barrett Henle, MD 05/09/22 1740

## 2022-05-09 NOTE — ED Triage Notes (Signed)
Pt reports right foot pain x 1 -2 weeks.

## 2022-05-09 NOTE — Discharge Instructions (Addendum)
Your x-ray was negative for any problem on the heel.  Apply mupirocin antibiotic ointment to the open areas on your heel to prevent infection.  You can also apply petroleum jelly to the heel for the dry skin.  Your heel pain persists you should see your primary care physician or you can go see a podiatrist.  Contact information is provided

## 2022-05-18 ENCOUNTER — Ambulatory Visit: Payer: Medicare HMO | Admitting: Podiatry

## 2022-05-29 ENCOUNTER — Encounter: Payer: Self-pay | Admitting: Podiatry

## 2022-05-29 ENCOUNTER — Ambulatory Visit (INDEPENDENT_AMBULATORY_CARE_PROVIDER_SITE_OTHER): Payer: Medicare HMO | Admitting: Podiatry

## 2022-05-29 DIAGNOSIS — E119 Type 2 diabetes mellitus without complications: Secondary | ICD-10-CM

## 2022-05-29 NOTE — Progress Notes (Signed)
  Subjective:  Patient ID: Jacqueline Ball, female    DOB: [birth year],   MRN: [id number]  No chief complaint on file.   81 y.o. female presents for. concern of  bilateral feet pain. She is diabetic and on gabapentin but relates she stopped taking gabapentin because she forgot. . Relates burning and tingling in their feet. Patient is diabetic and last A1c was  Lab Results  Component Value Date   HGBA1C 5.7 (H) 11/22/2014   .   PCP:  Elwin Mocha, MD    . Denies any other pedal complaints. Denies n/v/f/c.   Past Medical History:  Diagnosis Date   Anxiety    Atrial fibrillation ([place])    Diabetes mellitus    Hyperlipidemia    Hypertension    Memory loss    Mood disorder ([place])    Physical exam, annual 10/22/06   Renal cyst     Objective:  Physical Exam: Vascular: DP/PT pulses 2/4 bilateral. CFT <3 seconds. Absent hair growth on digits. Edema noted to bilateral lower extremities. Xerosis noted bilaterally.  Skin. No lacerations or abrasions bilateral feet. Nails 1-5 bilateral are normal in appearance.  Musculoskeletal: MMT 5/5 bilateral lower extremities in DF, PF, Inversion and Eversion. Deceased ROM in DF of ankle joint.  Neurological: Sensation intact to light touch. Protective sensation diminished bilateral.    Assessment:   1. Type 2 diabetes mellitus without complication, without long-term current use of insulin ([place])      Plan:  Patient was evaluated and treated and all questions answered. Discussed neuropathy and etiology as well as treatment with patient.  Radiographs reviewed and discussed with patient.  -Discussed and educated patient on diabetic foot care, especially with  regards to the vascular, neurological and musculoskeletal systems.  -Stressed the importance of good glycemic control and the detriment of not  controlling glucose levels in relation to the foot. -Discussed supportive shoes at all times and checking feet regularly.  -Follow-up with PCP for  prescription management of gabapentin.  -Patient to return in a year for diabetic foot check.    Lorenda Peck, DPM

## 2022-06-29 ENCOUNTER — Other Ambulatory Visit: Payer: Self-pay

## 2022-06-29 ENCOUNTER — Encounter (HOSPITAL_COMMUNITY): Payer: Self-pay

## 2022-06-29 ENCOUNTER — Emergency Department (HOSPITAL_COMMUNITY)
Admission: EM | Admit: 2022-06-29 | Discharge: 2022-06-29 | Disposition: A | Discharge status: Home / Self Care | Payer: Medicare (Managed Care) | Attending: Emergency Medicine | Admitting: Emergency Medicine

## 2022-06-29 ENCOUNTER — Emergency Department (HOSPITAL_COMMUNITY): Payer: Medicare (Managed Care)

## 2022-06-29 DIAGNOSIS — Z7901 Long term (current) use of anticoagulants: Secondary | ICD-10-CM | POA: Insufficient documentation

## 2022-06-29 DIAGNOSIS — Z7984 Long term (current) use of oral hypoglycemic drugs: Secondary | ICD-10-CM | POA: Diagnosis not present

## 2022-06-29 DIAGNOSIS — R42 Dizziness and giddiness: Secondary | ICD-10-CM | POA: Diagnosis present

## 2022-06-29 DIAGNOSIS — R739 Hyperglycemia, unspecified: Secondary | ICD-10-CM

## 2022-06-29 DIAGNOSIS — R4189 Other symptoms and signs involving cognitive functions and awareness: Secondary | ICD-10-CM

## 2022-06-29 DIAGNOSIS — E1165 Type 2 diabetes mellitus with hyperglycemia: Secondary | ICD-10-CM | POA: Insufficient documentation

## 2022-06-29 LAB — CBC
HCT: 39.4 % (ref 36.0–46.0)
Hemoglobin: 13.6 g/dL (ref 12.0–15.0)
MCH: 32.4 pg (ref 26.0–34.0)
MCHC: 34.5 g/dL (ref 30.0–36.0)
MCV: 93.8 fL (ref 80.0–100.0)
Platelets: 272 10*3/uL (ref 150–400)
RBC: 4.2 MIL/uL (ref 3.87–5.11)
RDW: 11.9 % (ref 11.5–15.5)
WBC: 8.2 10*3/uL (ref 4.0–10.5)
nRBC: 0 % (ref 0.0–0.2)

## 2022-06-29 LAB — URINALYSIS, ROUTINE W REFLEX MICROSCOPIC
Bacteria, UA: NONE SEEN
Bilirubin Urine: NEGATIVE
Glucose, UA: >=500 mg/dL — AB
Hgb urine dipstick: NEGATIVE
Ketones, ur: NEGATIVE mg/dL
Leukocytes,Ua: NEGATIVE
Nitrite: NEGATIVE
Protein, ur: NEGATIVE mg/dL
Specific Gravity, Urine: 1.028 (ref 1.005–1.030)
pH: 5 (ref 5.0–8.0)

## 2022-06-29 LAB — CBG MONITORING, ED
Glucose-Capillary: 412 mg/dL — ABNORMAL HIGH (ref 70–99)
Glucose-Capillary: 249 mg/dL — ABNORMAL HIGH (ref 70–99)

## 2022-06-29 LAB — BASIC METABOLIC PANEL
Anion gap: 13 (ref 5–15)
BUN: 16 mg/dL (ref 8–23)
CO2: 24 mmol/L (ref 22–32)
Calcium: 9.2 mg/dL (ref 8.9–10.3)
Chloride: 92 mmol/L — ABNORMAL LOW (ref 98–111)
Creatinine, Ser: 1.06 mg/dL — ABNORMAL HIGH (ref 0.44–1.00)
GFR, Estimated: 53 mL/min — ABNORMAL LOW (ref >60)
Glucose, Bld: 674 mg/dL (ref 70–99)
Potassium: 4 mmol/L (ref 3.5–5.1)
Sodium: 129 mmol/L — ABNORMAL LOW (ref 135–145)

## 2022-06-29 MED ORDER — SODIUM CHLORIDE 0.9 % IV BOLUS
1000.0000 mL | Freq: Once | INTRAVENOUS | Status: DC
Start: 2022-06-29 — End: 2022-06-29

## 2022-06-29 MED ORDER — SODIUM CHLORIDE 0.9 % IV BOLUS
2000.0000 mL | Freq: Once | INTRAVENOUS | Status: AC
Start: 2022-06-29 — End: 2022-06-29
  Administered 2022-06-29: 2000 mL via INTRAVENOUS

## 2022-06-29 MED ORDER — INSULIN ASPART 100 UNIT/ML IJ SOLN
10.0000 [IU] | Freq: Once | INTRAMUSCULAR | Status: AC
  Administered 2022-06-29: 10 [IU] via SUBCUTANEOUS
  Filled 2022-06-29: qty 0.1

## 2022-06-29 MED ORDER — METFORMIN HCL 500 MG PO TABS: 500.0000 mg | ORAL_TABLET | Freq: Two times a day (BID) | ORAL | 0 refills | Status: DC

## 2022-06-29 MED ORDER — GLIMEPIRIDE 4 MG PO TABS: 4.0000 mg | ORAL_TABLET | Freq: Every day | ORAL | 0 refills | Status: DC

## 2022-06-29 NOTE — ED Triage Notes (Signed)
Patient c/o urinary incontinence x 3-4 months ago and worsening in the past 3 days.

## 2022-06-29 NOTE — Discharge Instructions (Signed)
Your blood sugar was elevated and that is likely causing your symptoms.  I have represcribed your metformin 500 mg twice daily and you Amaryl 4 mg daily.  You need to follow-up with your primary care doctor in a week to recheck your sugar  Return to ER if you have worse dizziness, trouble urinating, fever, vomiting.

## 2022-06-29 NOTE — ED Provider Triage Note (Signed)
Emergency Medicine Provider Triage Evaluation Note  Jacqueline Ball , a 81 y.o. female  was evaluated in triage.  Pt complains of persistent dizziness for 3 to 4 months, as well as urinary incontinence for 3 to 4 months is worsened over the last few days.  Patient reports that she does think that she has a urologist but she has not seen them about this problem or try to schedule an appointment.  Patient has a history of A-fib, denies feeling of heart racing but reports that she does occasionally miss doses of her Eliquis.  Patient reports that she thinks that she took it today but does not know whether she has been taking it regularly or not.  Patient denies any numbness, tingling, vision changes, headache, chest pain, or other pain at this time.  She reports some slight dizziness while sitting, but reports that she has worsening dizziness with standing.  Review of Systems  Positive: Dizziness, urinary incontinence Negative: Chest pain, shob  Physical Exam  BP 133/82 (BP Location: Left Arm)   Pulse 80   Temp 98.6 F (37 C) (Oral)   Resp 18   Ht 5' 4.5" (1.638 m)   Wt 59 kg   SpO2 99%   BMI 21.97 kg/m  Gen:   Awake, no distress   Resp:  Normal effort  MSK:   Moves extremities without difficulty  Other:  Irregularly irregular rhythm, normal rate on my exam, endorses some dizziness with standing but no focal neuro deficits noted. Moves all 4 limbs spontaneously  Medical Decision Making  Medically screening exam initiated at 6:38 PM.  Appropriate orders placed.  Arta Stump was informed that the remainder of the evaluation will be completed by another provider, this initial triage assessment does not replace that evaluation, and the importance of remaining in the ED until their evaluation is complete.  Workup initiated   [place] 06/29/22 4360

## 2022-06-29 NOTE — ED Notes (Signed)
Pt ambulated to the BR without assistance. 

## 2022-06-29 NOTE — ED Notes (Signed)
Cbg 249

## 2022-06-29 NOTE — ED Provider Notes (Addendum)
[facility] DEPT Provider Note   CSN: [id number] Arrival date & time: 06/29/22  1744     History  Chief Complaint  Patient presents with   Urinary Incontinence    Jacqueline Ball is a 81 y.o. female history of diabetes, A-fib on Eliquis, here presenting with urinary frequency and dizziness and increased thirstiness.  Patient has not been doing well for the last several months.  She has not been taking her meds as prescribed.  She states that she is very thirsty and feels very lightheaded and dizzy.  The last 3 to 4 days she has been urinating frequently.  Initially she told him that she has incontinence but she told me that she just urinates every 20 to 30 minutes.  She actually has no back pain or trouble walking.  Denies any fevers or chills  The history is provided by the patient.       Home Medications Prior to Admission medications   Medication Sig Start Date End Date Taking? Authorizing Provider  ACCU-CHEK GUIDE test strip USE AS DIRECTED UP TO THREE TIMES DAILY 01/16/21   [provider]  acetaminophen (TYLENOL) 500 MG tablet Take 500 mg by mouth every 6 (six) hours as needed for pain.    [provider]  apixaban (ELIQUIS) 5 MG TABS tablet Take 1 tablet (5 mg total) by mouth 2 (two) times daily. 11/24/14   Charolette Forward, MD  atenolol (TENORMIN) 50 MG tablet Take 50 mg by mouth daily. 07/29/19   [provider]  atorvastatin (LIPITOR) 40 MG tablet Take 40 mg by mouth at bedtime.    [provider]  azelastine (ASTELIN) 0.1 % nasal spray Place 1 spray into both nostrils as needed for rhinitis or allergies.  05/24/18   [provider]  Blood Glucose Monitoring Suppl (ACCU-CHEK GUIDE) w/Device KIT USE AS DIRECTED UP TO THREE TIMES DAILY 01/16/21   [provider]  cyclobenzaprine (FLEXERIL) 5 MG tablet Take 1-2 tablets (5-10 mg total) by mouth at bedtime. 06/06/20   Wieters, Hallie C, PA-C  diclofenac  Sodium (VOLTAREN) 1 % GEL Apply one application (2g) up to four times daily as needed for pain. 12/08/21   Crain, Whitney L, PA  dicyclomine (BENTYL) 20 MG tablet Take 20 mg by mouth 3 (three) times daily as needed for spasms. 08/29/18   [provider]  donepezil (ARICEPT) 10 MG tablet Take 1 tablet (10 mg total) by mouth at bedtime. 06/06/17   Rosalin Hawking, MD  esomeprazole (NEXIUM) 40 MG capsule Take 40 mg by mouth daily at 12 noon.    [provider]  estradiol (CLIMARA) 0.06 MG/24HR Place 0.06 patches onto the skin once a week. 08/12/17   [provider]  fluconazole (DIFLUCAN) 150 MG tablet fluconazole 150 mg tablet    [provider]  fluticasone (FLONASE) 50 MCG/ACT nasal spray Place 1 spray into both nostrils daily. 09/15/18   Bast, Tressia Miners A, NP  glimepiride (AMARYL) 4 MG tablet Take 4 mg by mouth daily with breakfast.    [provider]  losartan (COZAAR) 25 MG tablet Take 25 mg by mouth daily. 05/03/20   [provider]  metFORMIN (GLUCOPHAGE) 500 MG tablet Take 500 mg by mouth 2 (two) times daily with a meal.  08/23/15   [provider]  metroNIDAZOLE (FLAGYL) 500 MG tablet Take 1 tablet (500 mg total) by mouth 2 (two) times daily. 03/31/22   Lucrezia Starch, MD  mupirocin ointment Drue Stager)  2 % Apply 1 application. topically 2 (two) times daily. To affected area till better 05/09/22   Barrett Henle, MD  MYRBETRIQ 50 MG TB24 tablet Take 50 mg by mouth daily. 07/29/19   [provider]  nystatin cream (MYCOSTATIN) Apply to affected area 2 times daily 04/02/20   Loura Halt A, NP  omeprazole (PRILOSEC) 40 MG capsule Take 40 mg by mouth daily. 11/27/19   [provider]  pantoprazole (PROTONIX) 20 MG tablet Take 1 tablet (20 mg total) by mouth daily. 02/24/20   Marney Setting, NP  rosuvastatin (CRESTOR) 10 MG tablet Take 10 mg by mouth at bedtime. 09/14/19   [provider]   sulfamethoxazole-trimethoprim (BACTRIM DS) 800-160 MG tablet Take 1 tablet by mouth 2 (two) times daily. 03/12/20   Jaynee Eagles, PA-C  traMADol (ULTRAM) 50 MG tablet Take 1 tablet (50 mg total) by mouth 2 (two) times daily as needed for severe pain. for pain 11/23/21   Antonietta Breach, PA-C  VESICARE 10 MG tablet Take 10 mg by mouth daily.  01/17/18   [provider]      Allergies    Metoprolol    Review of Systems   Review of Systems  Constitutional:  Positive for fatigue.  Neurological:  Positive for dizziness.  All other systems reviewed and are negative.   Physical Exam Updated Vital Signs BP (!) 158/87   Pulse 62   Temp 98.6 F (37 C) (Oral)   Resp 14   Ht 5' 4.5" (1.638 m)   Wt 59 kg   SpO2 100%   BMI 21.97 kg/m  Physical Exam Vitals and nursing note reviewed.  Constitutional:      Comments: Slightly dehydrated  HENT:     Head: Normocephalic.     Nose: Nose normal.     Mouth/Throat:     Mouth: Mucous membranes are dry.  Eyes:     Extraocular Movements: Extraocular movements intact.     Pupils: Pupils are equal, round, and reactive to light.  Cardiovascular:     Rate and Rhythm: Normal rate and regular rhythm.     Pulses: Normal pulses.     Heart sounds: Normal heart sounds.  Pulmonary:     Effort: Pulmonary effort is normal.     Breath sounds: Normal breath sounds.  Abdominal:     General: Abdomen is flat.     Palpations: Abdomen is soft.  Musculoskeletal:        General: Normal range of motion.     Cervical back: Normal range of motion and neck supple.  Skin:    General: Skin is warm.     Capillary Refill: Capillary refill takes less than 2 seconds.  Neurological:     General: No focal deficit present.     Mental Status: She is oriented to person, place, and time.  Psychiatric:        Mood and Affect: Mood normal.        Behavior: Behavior normal.     ED Results / Procedures / Treatments   Labs (all labs ordered are listed, but only  abnormal results are displayed) Labs Reviewed  BASIC METABOLIC PANEL - Abnormal; Notable for the following components:      Result Value   Sodium 129 (*)    Chloride 92 (*)    Glucose, Bld 674 (*)    Creatinine, Ser 1.06 (*)    GFR, Estimated 53 (*)    All other components within normal limits  URINALYSIS, ROUTINE W REFLEX MICROSCOPIC - Abnormal; Notable for the following components:   Color, Urine COLORLESS (*)    Glucose, UA >=500 (*)    All other components within normal limits  CBC    EKG EKG Interpretation  Date/Time:  Friday June 29 2022 18:47:15 EDT Ventricular Rate:  74 PR Interval:  170 QRS Duration: 77 QT Interval:  368 QTC Calculation: 409 R Axis:   6 Text Interpretation: Sinus rhythm Atrial premature complex No significant change since last tracing Confirmed by Wandra Arthurs [phone]) on 06/29/2022 7:32:26 PM  Radiology CT Head Wo Contrast  Result Date: 06/29/2022 CLINICAL DATA:  Dizziness, persistent/recurrent, cardiac or vascular cause suspected EXAM: CT HEAD WITHOUT CONTRAST TECHNIQUE: Contiguous axial images were obtained from the base of the skull through the vertex without intravenous contrast. RADIATION DOSE REDUCTION: This exam was performed according to the departmental dose-optimization program which includes automated exposure control, adjustment of the mA and/or kV according to patient size and/or use of iterative reconstruction technique. COMPARISON:  Head CT 03/31/2022 FINDINGS: Brain: Stable degree of atrophy and chronic small vessel ischemia. No intracranial hemorrhage, mass effect, or midline shift. No hydrocephalus. The basilar cisterns are patent. No evidence of territorial infarct or acute ischemia. No extra-axial or intracranial fluid collection. Vascular: No hyperdense vessel or unexpected calcification. Skull: No fracture or focal lesion. Sinuses/Orbits: Paranasal sinuses and mastoid air cells are clear. The visualized orbits are unremarkable. Bilateral  cataract resection Other: None. IMPRESSION: 1. No acute intracranial abnormality. 2. Stable atrophy and chronic small vessel ischemia. Electronically Signed   By: Keith Rake M.D.   On: 06/29/2022 19:20    Procedures Procedures    Medications Ordered in ED Medications  insulin aspart (novoLOG) injection 10 Units (10 Units Subcutaneous Given 06/29/22 2007)  sodium chloride 0.9 % bolus 2,000 mL (2,000 mLs Intravenous New Bag/Given 06/29/22 2013)    ED Course/ Medical Decision Making/ A&P                           Medical Decision Making Jacqueline Ball is a 81 y.o. female here presenting with dizziness and increased thirstiness.  She is not very compliant with her meds.  Concern for possible hyperglycemia versus UTI versus electrolyte abnormality.  CT head was ordered in triage due to dizziness to rule out bleed. Plan to get CBC and CMP and urinalysis and CT head  10:07 PM I reviewed patient's labs and independently interpreted CT scan.  Labs showed glucose 674 initially.  Her anion gap is normal.  After 2 L normal saline bolus and 10 units insulin, glucose decreased down to 249.  Her urinalysis did not show UTI.  I think her dizziness and urinary frequency likely secondary to uncontrolled diabetes.  She has not been taking her metformin.  I will represcribe her of improvement and I told her to follow-up with PCP in a week to recheck her blood sugar  Problems Addressed: Hyperglycemia: acute illness or injury  Amount and/or Complexity of Data Reviewed Labs: ordered. Decision-making details documented in ED Course. Radiology: ordered and independent interpretation performed. Decision-making details documented in ED Course. ECG/medicine tests: ordered and independent interpretation performed. Decision-making details documented in ED Course.  Risk Prescription drug management.    Final Clinical Impression(s) / ED Diagnoses Final diagnoses:  None    Rx / DC Orders ED Discharge Orders      None         Shirlyn Goltz  Hsienta, MD 06/29/22 2209    Drenda Freeze, MD 06/29/22 [phone]

## 2022-07-05 ENCOUNTER — Other Ambulatory Visit: Payer: Self-pay

## 2022-07-05 ENCOUNTER — Emergency Department (HOSPITAL_COMMUNITY)
Admission: EM | Admit: 2022-07-05 | Discharge: 2022-07-05 | Disposition: A | Discharge status: Home / Self Care | Payer: Medicare (Managed Care) | Attending: Emergency Medicine | Admitting: Emergency Medicine

## 2022-07-05 ENCOUNTER — Encounter (HOSPITAL_COMMUNITY): Payer: Self-pay | Admitting: Emergency Medicine

## 2022-07-05 DIAGNOSIS — F0390 Unspecified dementia without behavioral disturbance: Secondary | ICD-10-CM | POA: Insufficient documentation

## 2022-07-05 DIAGNOSIS — R739 Hyperglycemia, unspecified: Secondary | ICD-10-CM | POA: Diagnosis present

## 2022-07-05 DIAGNOSIS — Z7901 Long term (current) use of anticoagulants: Secondary | ICD-10-CM | POA: Insufficient documentation

## 2022-07-05 DIAGNOSIS — Z7984 Long term (current) use of oral hypoglycemic drugs: Secondary | ICD-10-CM | POA: Insufficient documentation

## 2022-07-05 DIAGNOSIS — E1165 Type 2 diabetes mellitus with hyperglycemia: Secondary | ICD-10-CM | POA: Insufficient documentation

## 2022-07-05 LAB — CBG MONITORING, ED
Glucose-Capillary: 356 mg/dL — ABNORMAL HIGH (ref 70–99)
Glucose-Capillary: 278 mg/dL — ABNORMAL HIGH (ref 70–99)
Glucose-Capillary: 188 mg/dL — ABNORMAL HIGH (ref 70–99)

## 2022-07-05 LAB — COMPREHENSIVE METABOLIC PANEL
ALT: 23 U/L (ref 0–44)
AST: 18 U/L (ref 15–41)
Albumin: 3.5 g/dL (ref 3.5–5.0)
Alkaline Phosphatase: 97 U/L (ref 38–126)
Anion gap: 13 (ref 5–15)
BUN: 8 mg/dL (ref 8–23)
CO2: 21 mmol/L — ABNORMAL LOW (ref 22–32)
Calcium: 9.1 mg/dL (ref 8.9–10.3)
Chloride: 102 mmol/L (ref 98–111)
Creatinine, Ser: 0.65 mg/dL (ref 0.44–1.00)
GFR, Estimated: >60 mL/min (ref >60)
Glucose, Bld: 363 mg/dL — ABNORMAL HIGH (ref 70–99)
Potassium: 3.7 mmol/L (ref 3.5–5.1)
Sodium: 136 mmol/L (ref 135–145)
Total Bilirubin: 0.5 mg/dL (ref 0.3–1.2)
Total Protein: 6.5 g/dL (ref 6.5–8.1)

## 2022-07-05 LAB — CBC WITH DIFFERENTIAL/PLATELET
Abs Immature Granulocytes: 0.02 10*3/uL (ref 0.00–0.07)
Basophils Absolute: 0 10*3/uL (ref 0.0–0.1)
Basophils Relative: 0 %
Eosinophils Absolute: 0 10*3/uL (ref 0.0–0.5)
Eosinophils Relative: 0 %
HCT: 38.9 % (ref 36.0–46.0)
Hemoglobin: 13.1 g/dL (ref 12.0–15.0)
Immature Granulocytes: 0 %
Lymphocytes Relative: 24 %
Lymphs Abs: 1.7 10*3/uL (ref 0.7–4.0)
MCH: 31.8 pg (ref 26.0–34.0)
MCHC: 33.7 g/dL (ref 30.0–36.0)
MCV: 94.4 fL (ref 80.0–100.0)
Monocytes Absolute: 0.5 10*3/uL (ref 0.1–1.0)
Monocytes Relative: 8 %
Neutro Abs: 4.7 10*3/uL (ref 1.7–7.7)
Neutrophils Relative %: 68 %
Platelets: 303 10*3/uL (ref 150–400)
RBC: 4.12 MIL/uL (ref 3.87–5.11)
RDW: 11.9 % (ref 11.5–15.5)
WBC: 7 10*3/uL (ref 4.0–10.5)
nRBC: 0 % (ref 0.0–0.2)

## 2022-07-05 LAB — URINALYSIS, ROUTINE W REFLEX MICROSCOPIC
Bilirubin Urine: NEGATIVE
Glucose, UA: >=500 mg/dL — AB
Hgb urine dipstick: NEGATIVE
Ketones, ur: NEGATIVE mg/dL
Leukocytes,Ua: NEGATIVE
Nitrite: NEGATIVE
Protein, ur: NEGATIVE mg/dL
Specific Gravity, Urine: 1.028 (ref 1.005–1.030)
pH: 5 (ref 5.0–8.0)

## 2022-07-05 MED ORDER — SODIUM CHLORIDE 0.9 % IV BOLUS
1000.0000 mL | Freq: Once | INTRAVENOUS | Status: AC
  Administered 2022-07-05: 1000 mL via INTRAVENOUS

## 2022-07-05 NOTE — ED Provider Notes (Signed)
[facility] EMERGENCY DEPARTMENT Provider Note   CSN: [id number] Arrival date & time: 07/05/22  1343     History  No chief complaint on file.   Jacqueline Ball is a 81 y.o. female hx of diabetes, dementia here presenting with hyperglycemia.  Patient is a very poor historian.  I saw the patient about a week ago for hyperglycemia.  At that time her sugar was over 600.  She was not compliant with her metformin at that time.  Prescribed metformin.  She is here because she is persistently thirsty and also urinating frequently.  She has not seen her primary care doctor since the ED visit. She states that she did miss several doses of her metformin.  She states that she check her sugar and it was elevated  The history is provided by the patient.       Home Medications Prior to Admission medications   Medication Sig Start Date End Date Taking? Authorizing Provider  ACCU-CHEK GUIDE test strip USE AS DIRECTED UP TO THREE TIMES DAILY 01/16/21   [provider]  acetaminophen (TYLENOL) 500 MG tablet Take 500 mg by mouth every 6 (six) hours as needed for pain.    [provider]  apixaban (ELIQUIS) 5 MG TABS tablet Take 1 tablet (5 mg total) by mouth 2 (two) times daily. 11/24/14   Charolette Forward, MD  atenolol (TENORMIN) 50 MG tablet Take 50 mg by mouth daily. 07/29/19   [provider]  atorvastatin (LIPITOR) 40 MG tablet Take 40 mg by mouth at bedtime.    [provider]  azelastine (ASTELIN) 0.1 % nasal spray Place 1 spray into both nostrils as needed for rhinitis or allergies.  05/24/18   [provider]  Blood Glucose Monitoring Suppl (ACCU-CHEK GUIDE) w/Device KIT USE AS DIRECTED UP TO THREE TIMES DAILY 01/16/21   [provider]  cyclobenzaprine (FLEXERIL) 5 MG tablet Take 1-2 tablets (5-10 mg total) by mouth at bedtime. 06/06/20   Wieters, Hallie C, PA-C  diclofenac Sodium (VOLTAREN) 1 % GEL Apply one application (2g) up to four  times daily as needed for pain. 12/08/21   Crain, Whitney L, PA  dicyclomine (BENTYL) 20 MG tablet Take 20 mg by mouth 3 (three) times daily as needed for spasms. 08/29/18   [provider]  donepezil (ARICEPT) 10 MG tablet Take 1 tablet (10 mg total) by mouth at bedtime. 06/06/17   Rosalin Hawking, MD  esomeprazole (NEXIUM) 40 MG capsule Take 40 mg by mouth daily at 12 noon.    [provider]  estradiol (CLIMARA) 0.06 MG/24HR Place 0.06 patches onto the skin once a week. 08/12/17   [provider]  fluconazole (DIFLUCAN) 150 MG tablet fluconazole 150 mg tablet    [provider]  fluticasone (FLONASE) 50 MCG/ACT nasal spray Place 1 spray into both nostrils daily. 09/15/18   Loura Halt A, NP  glimepiride (AMARYL) 4 MG tablet Take 1 tablet (4 mg total) by mouth daily with breakfast. 06/29/22   Drenda Freeze, MD  losartan (COZAAR) 25 MG tablet Take 25 mg by mouth daily. 05/03/20   [provider]  metFORMIN (GLUCOPHAGE) 500 MG tablet Take 1 tablet (500 mg total) by mouth 2 (two) times daily with a meal. 06/29/22   Drenda Freeze, MD  metroNIDAZOLE (FLAGYL) 500 MG tablet Take 1 tablet (500 mg total) by mouth 2 (two) times daily. 03/31/22   Lucrezia Starch, MD  mupirocin ointment (BACTROBAN) 2 %  Apply 1 application. topically 2 (two) times daily. To affected area till better 05/09/22   Barrett Henle, MD  MYRBETRIQ 50 MG TB24 tablet Take 50 mg by mouth daily. 07/29/19   [provider]  nystatin cream (MYCOSTATIN) Apply to affected area 2 times daily 04/02/20   Loura Halt A, NP  omeprazole (PRILOSEC) 40 MG capsule Take 40 mg by mouth daily. 11/27/19   [provider]  pantoprazole (PROTONIX) 20 MG tablet Take 1 tablet (20 mg total) by mouth daily. 02/24/20   Marney Setting, NP  rosuvastatin (CRESTOR) 10 MG tablet Take 10 mg by mouth at bedtime. 09/14/19   [provider]  sulfamethoxazole-trimethoprim (BACTRIM DS) 800-160 MG  tablet Take 1 tablet by mouth 2 (two) times daily. 03/12/20   Jaynee Eagles, PA-C  traMADol (ULTRAM) 50 MG tablet Take 1 tablet (50 mg total) by mouth 2 (two) times daily as needed for severe pain. for pain 11/23/21   Antonietta Breach, PA-C  VESICARE 10 MG tablet Take 10 mg by mouth daily.  01/17/18   [provider]      Allergies    Metoprolol    Review of Systems   Review of Systems  Constitutional:  Positive for fatigue.  Genitourinary:  Positive for frequency.  All other systems reviewed and are negative.   Physical Exam Updated Vital Signs BP 131/78   Pulse 63   Temp 98.3 F (36.8 C) (Oral)   Resp 18   SpO2 97%  Physical Exam Vitals and nursing note reviewed.  Constitutional:      Comments: Chronically ill, slightly dehydrated and demented  HENT:     Head: Normocephalic.     Nose: Nose normal.     Mouth/Throat:     Mouth: Mucous membranes are dry.  Eyes:     Extraocular Movements: Extraocular movements intact.     Pupils: Pupils are equal, round, and reactive to light.  Cardiovascular:     Rate and Rhythm: Normal rate and regular rhythm.     Pulses: Normal pulses.     Heart sounds: Normal heart sounds.  Pulmonary:     Effort: Pulmonary effort is normal.     Breath sounds: Normal breath sounds.  Abdominal:     General: Abdomen is flat.     Palpations: Abdomen is soft.  Musculoskeletal:        General: Normal range of motion.     Cervical back: Normal range of motion and neck supple.  Skin:    General: Skin is warm.     Capillary Refill: Capillary refill takes less than 2 seconds.  Neurological:     General: No focal deficit present.     Comments: Demented and ANO x2.  Patient has normal strength bilateral arms and legs  Psychiatric:        Mood and Affect: Mood normal.        Behavior: Behavior normal.     ED Results / Procedures / Treatments   Labs (all labs ordered are listed, but only abnormal results are displayed) Labs Reviewed  COMPREHENSIVE  METABOLIC PANEL - Abnormal; Notable for the following components:      Result Value   CO2 21 (*)    Glucose, Bld 363 (*)    All other components within normal limits  URINALYSIS, ROUTINE W REFLEX MICROSCOPIC - Abnormal; Notable for the following components:   Color, Urine STRAW (*)    Glucose, UA >=500 (*)    Bacteria, UA RARE (*)  All other components within normal limits  CBG MONITORING, ED - Abnormal; Notable for the following components:   Glucose-Capillary 356 (*)    All other components within normal limits  CBG MONITORING, ED - Abnormal; Notable for the following components:   Glucose-Capillary 278 (*)    All other components within normal limits  CBC WITH DIFFERENTIAL/PLATELET    EKG None  Radiology No results found.  Procedures Procedures    Medications Ordered in ED Medications  sodium chloride 0.9 % bolus 1,000 mL (has no administration in time range)    ED Course/ Medical Decision Making/ A&P                           Medical Decision Making Ashlyn Cabler is a 81 y.o. female here presenting with increased thirstiness and frequent urination.  Patient states that her blood sugar was elevated but is noncompliant with her metformin.  Concern for hyperglycemia in the setting of medication noncompliance.  Low suspicion for DKA.  We will get CBC and CMP and urinalysis.  We will hydrate patient and reassess.  8:37 PM I reviewed patient's labs.  Initial glucose was 356.  Her anion gap was normal.  Patient was given a liter bolus and glucose went down to 188.  She did miss several doses of her metformin.  I told her to take it as prescribed.  She also needs close follow-up with PCP  Problems Addressed: Hyperglycemia: acute illness or injury  Amount and/or Complexity of Data Reviewed Labs: ordered. Decision-making details documented in ED Course. Radiology: ordered and independent interpretation performed.    Final Clinical Impression(s) / ED Diagnoses Final  diagnoses:  None    Rx / DC Orders ED Discharge Orders     None         Drenda Freeze, MD 07/05/22 2038

## 2022-07-05 NOTE — Care Management (Signed)
HH orders sent to [facility]  for [facility] services

## 2022-07-05 NOTE — ED Triage Notes (Signed)
Pt states she has been feeling weak for about a week. Pt is diabetic, pt is thirsty, urinating frequently. Pt is poor historian, very hard to get any more details.

## 2022-07-05 NOTE — Discharge Instructions (Signed)
Your blood sugar was 350 when you got here.  As we discussed, you need to take your metformin and amaryl as prescribed   See your doctor for follow-up  Return to ER if you have worse dizziness, trouble urinating, fever,

## 2022-07-05 NOTE — ED Provider Triage Note (Signed)
Emergency Medicine Provider Triage Evaluation Note  Jacqueline Ball , a 81 y.o. female  was evaluated in triage.  Pt complains of hyperglycemia.  Reports noncompliance with medications.  Recently in the emergency room for hypoglycemia.  States she has been running around 500 at home.  Evaluated at PCP office and recommended to come into the emergency room.  Patient is well-appearing and comfortable on exam.  Denies nausea, vomiting.  Review of Systems  Positive: As above Negative: As above  Physical Exam  BP 117/75 (BP Location: Left Arm)   Pulse 71   Temp 98.3 F (36.8 C) (Oral)   Resp 17   SpO2 99%  Gen:   Awake, no distress   Resp:  Normal effort  MSK:   Moves extremities without difficulty  Other:    Medical Decision Making  Medically screening exam initiated at 2:36 PM.  Appropriate orders placed.  Jacqueline Ball was informed that the remainder of the evaluation will be completed by another provider, this initial triage assessment does not replace that evaluation, and the importance of remaining in the ED until their evaluation is complete.     Marita Kansas, PA-C 07/05/22 1437

## 2022-07-06 ENCOUNTER — Other Ambulatory Visit: Payer: Self-pay

## 2022-07-06 ENCOUNTER — Emergency Department (HOSPITAL_COMMUNITY)
Admission: EM | Admit: 2022-07-06 | Discharge: 2022-07-07 | Discharge status: Against Medical Advice | Payer: Medicare (Managed Care) | Attending: Emergency Medicine | Admitting: Emergency Medicine

## 2022-07-06 ENCOUNTER — Encounter (HOSPITAL_COMMUNITY): Payer: Self-pay | Admitting: Emergency Medicine

## 2022-07-06 DIAGNOSIS — Z5321 Procedure and treatment not carried out due to patient leaving prior to being seen by health care provider: Secondary | ICD-10-CM | POA: Insufficient documentation

## 2022-07-06 DIAGNOSIS — Z0000 Encounter for general adult medical examination without abnormal findings: Secondary | ICD-10-CM | POA: Insufficient documentation

## 2022-07-06 DIAGNOSIS — R7309 Other abnormal glucose: Secondary | ICD-10-CM | POA: Diagnosis not present

## 2022-07-06 LAB — COMPREHENSIVE METABOLIC PANEL
ALT: 20 U/L (ref 0–44)
AST: 18 U/L (ref 15–41)
Albumin: 3.7 g/dL (ref 3.5–5.0)
Alkaline Phosphatase: 115 U/L (ref 38–126)
Anion gap: 9 (ref 5–15)
BUN: 5 mg/dL — ABNORMAL LOW (ref 8–23)
CO2: 24 mmol/L (ref 22–32)
Calcium: 9.6 mg/dL (ref 8.9–10.3)
Chloride: 101 mmol/L (ref 98–111)
Creatinine, Ser: 0.79 mg/dL (ref 0.44–1.00)
GFR, Estimated: >60 mL/min (ref >60)
Glucose, Bld: 465 mg/dL — ABNORMAL HIGH (ref 70–99)
Potassium: 3.7 mmol/L (ref 3.5–5.1)
Sodium: 134 mmol/L — ABNORMAL LOW (ref 135–145)
Total Bilirubin: 0.5 mg/dL (ref 0.3–1.2)
Total Protein: 6.8 g/dL (ref 6.5–8.1)

## 2022-07-06 LAB — CBG MONITORING, ED: Glucose-Capillary: 427 mg/dL — ABNORMAL HIGH (ref 70–99)

## 2022-07-06 LAB — CBC WITH DIFFERENTIAL/PLATELET
Abs Immature Granulocytes: 0.01 10*3/uL (ref 0.00–0.07)
Basophils Absolute: 0 10*3/uL (ref 0.0–0.1)
Basophils Relative: 0 %
Eosinophils Absolute: 0 10*3/uL (ref 0.0–0.5)
Eosinophils Relative: 0 %
HCT: 38.7 % (ref 36.0–46.0)
Hemoglobin: 12.9 g/dL (ref 12.0–15.0)
Immature Granulocytes: 0 %
Lymphocytes Relative: 39 %
Lymphs Abs: 2.5 10*3/uL (ref 0.7–4.0)
MCH: 31.5 pg (ref 26.0–34.0)
MCHC: 33.3 g/dL (ref 30.0–36.0)
MCV: 94.6 fL (ref 80.0–100.0)
Monocytes Absolute: 0.6 10*3/uL (ref 0.1–1.0)
Monocytes Relative: 9 %
Neutro Abs: 3.2 10*3/uL (ref 1.7–7.7)
Neutrophils Relative %: 52 %
Platelets: 346 10*3/uL (ref 150–400)
RBC: 4.09 MIL/uL (ref 3.87–5.11)
RDW: 12.1 % (ref 11.5–15.5)
WBC: 6.3 10*3/uL (ref 4.0–10.5)
nRBC: 0 % (ref 0.0–0.2)

## 2022-07-06 LAB — URINALYSIS, ROUTINE W REFLEX MICROSCOPIC
Bacteria, UA: NONE SEEN
Bilirubin Urine: NEGATIVE
Glucose, UA: >=500 mg/dL — AB
Hgb urine dipstick: NEGATIVE
Ketones, ur: NEGATIVE mg/dL
Leukocytes,Ua: NEGATIVE
Nitrite: NEGATIVE
Protein, ur: NEGATIVE mg/dL
Specific Gravity, Urine: 1.022 (ref 1.005–1.030)
pH: 5 (ref 5.0–8.0)

## 2022-07-06 NOTE — ED Triage Notes (Signed)
Patient here for education on using her Lantus Solostar pen.  She states that she does not know if she has used it correctly.

## 2022-07-06 NOTE — ED Notes (Signed)
Patient here with daughter, daughter and patient seem confused on operation of lantus pen.  Pharmacist educated at bedside.  Per pharmacy, patient's dosage seems to be possibly not enough at 1u of Lantus.

## 2022-07-06 NOTE — ED Provider Triage Note (Signed)
Emergency Medicine Provider Triage Evaluation Note  Jacqueline Ball , a 81 y.o. female  was evaluated in triage.  Pt complains of hyperglycemia.  Seen yesterday for same.  Apparently is having some issues is understanding how her insulin pens work.  CBG in triage 472.  Review of Systems  Positive: hyperglycemia Negative: fever  Physical Exam  BP (!) 149/91   Pulse 69   Temp (!) 97.5 F (36.4 C)   Resp 16   SpO2 95%  Gen:   Awake, no distress   Resp:  Normal effort  MSK:   Moves extremities without difficulty  Other:    Medical Decision Making  Medically screening exam initiated at 10:03 PM.  Appropriate orders placed.  Angla Delahunt was informed that the remainder of the evaluation will be completed by another provider, this initial triage assessment does not replace that evaluation, and the importance of remaining in the ED until their evaluation is complete.  Hyperglycemia.  Seen yesterday for same.  Some issues understanding how to use her insulin.  Pharmacy in room to provide some education.  CBG 472 in triage.  Will check labs, UA.   Garlon Hatchet, PA-C 07/06/22 2204

## 2022-07-07 NOTE — ED Notes (Signed)
Patient seen leaving emergency room

## 2022-07-08 ENCOUNTER — Emergency Department (HOSPITAL_COMMUNITY)
Admission: EM | Admit: 2022-07-08 | Discharge: 2022-07-08 | Disposition: A | Discharge status: Home / Self Care | Payer: Medicare (Managed Care) | Attending: Emergency Medicine | Admitting: Emergency Medicine

## 2022-07-08 ENCOUNTER — Encounter (HOSPITAL_COMMUNITY): Payer: Self-pay

## 2022-07-08 ENCOUNTER — Other Ambulatory Visit: Payer: Self-pay

## 2022-07-08 DIAGNOSIS — Z794 Long term (current) use of insulin: Secondary | ICD-10-CM | POA: Diagnosis not present

## 2022-07-08 DIAGNOSIS — R739 Hyperglycemia, unspecified: Secondary | ICD-10-CM | POA: Diagnosis present

## 2022-07-08 DIAGNOSIS — Z7901 Long term (current) use of anticoagulants: Secondary | ICD-10-CM | POA: Diagnosis not present

## 2022-07-08 DIAGNOSIS — F0390 Unspecified dementia without behavioral disturbance: Secondary | ICD-10-CM | POA: Diagnosis not present

## 2022-07-08 DIAGNOSIS — Z91148 Patient's other noncompliance with medication regimen for other reason: Secondary | ICD-10-CM | POA: Diagnosis not present

## 2022-07-08 DIAGNOSIS — E1165 Type 2 diabetes mellitus with hyperglycemia: Secondary | ICD-10-CM | POA: Diagnosis not present

## 2022-07-08 LAB — CBC WITH DIFFERENTIAL/PLATELET
Abs Immature Granulocytes: 0.01 10*3/uL (ref 0.00–0.07)
Basophils Absolute: 0 10*3/uL (ref 0.0–0.1)
Basophils Relative: 0 %
Eosinophils Absolute: 0 10*3/uL (ref 0.0–0.5)
Eosinophils Relative: 0 %
HCT: 39.5 % (ref 36.0–46.0)
Hemoglobin: 13.1 g/dL (ref 12.0–15.0)
Immature Granulocytes: 0 %
Lymphocytes Relative: 24 %
Lymphs Abs: 1.4 10*3/uL (ref 0.7–4.0)
MCH: 31.6 pg (ref 26.0–34.0)
MCHC: 33.2 g/dL (ref 30.0–36.0)
MCV: 95.2 fL (ref 80.0–100.0)
Monocytes Absolute: 0.5 10*3/uL (ref 0.1–1.0)
Monocytes Relative: 9 %
Neutro Abs: 4.1 10*3/uL (ref 1.7–7.7)
Neutrophils Relative %: 67 %
Platelets: 348 10*3/uL (ref 150–400)
RBC: 4.15 MIL/uL (ref 3.87–5.11)
RDW: 12.2 % (ref 11.5–15.5)
WBC: 6.1 10*3/uL (ref 4.0–10.5)
nRBC: 0 % (ref 0.0–0.2)

## 2022-07-08 LAB — COMPREHENSIVE METABOLIC PANEL
ALT: 23 U/L (ref 0–44)
AST: 20 U/L (ref 15–41)
Albumin: 3.8 g/dL (ref 3.5–5.0)
Alkaline Phosphatase: 113 U/L (ref 38–126)
Anion gap: 8 (ref 5–15)
BUN: 9 mg/dL (ref 8–23)
CO2: 27 mmol/L (ref 22–32)
Calcium: 9.4 mg/dL (ref 8.9–10.3)
Chloride: 99 mmol/L (ref 98–111)
Creatinine, Ser: 1.07 mg/dL — ABNORMAL HIGH (ref 0.44–1.00)
GFR, Estimated: 53 mL/min — ABNORMAL LOW (ref >60)
Glucose, Bld: 521 mg/dL (ref 70–99)
Potassium: 3.9 mmol/L (ref 3.5–5.1)
Sodium: 134 mmol/L — ABNORMAL LOW (ref 135–145)
Total Bilirubin: 0.5 mg/dL (ref 0.3–1.2)
Total Protein: 6.8 g/dL (ref 6.5–8.1)

## 2022-07-08 LAB — URINALYSIS, ROUTINE W REFLEX MICROSCOPIC
Bilirubin Urine: NEGATIVE
Glucose, UA: >=500 mg/dL — AB
Hgb urine dipstick: NEGATIVE
Ketones, ur: NEGATIVE mg/dL
Leukocytes,Ua: NEGATIVE
Nitrite: NEGATIVE
Protein, ur: NEGATIVE mg/dL
Specific Gravity, Urine: 1.025 (ref 1.005–1.030)
pH: 5 (ref 5.0–8.0)

## 2022-07-08 LAB — CBG MONITORING, ED
Glucose-Capillary: 541 mg/dL (ref 70–99)
Glucose-Capillary: 253 mg/dL — ABNORMAL HIGH (ref 70–99)

## 2022-07-08 MED ORDER — SODIUM CHLORIDE 0.9 % IV BOLUS
1000.0000 mL | Freq: Once | INTRAVENOUS | Status: AC
  Administered 2022-07-08: 1000 mL via INTRAVENOUS

## 2022-07-08 NOTE — ED Provider Triage Note (Signed)
Emergency Medicine Provider Triage Evaluation Note  Jacqueline Ball , a 81 y.o. female  was evaluated in triage.  Pt complains of medication counseling onset today. Was having trouble using her No meds tried PTA. Denies polyuria, increased thirst, nausea, vomiting.   Per patient chart review: Patient was evaluated in the emergency department on 07/05/2022 for similar concerns.  At that time there was no concerns for DKA, anion gap was normal.  Patient was given a liter bolus of fluids and glucose reduced in the emergency department.  Patient was told to have close follow-up with her primary care provider.  Review of Systems  Positive: As per HPI Negative:   Physical Exam  BP 110/68 (BP Location: Right Arm)   Pulse 76   Temp 98.8 F (37.1 C) (Oral)   Resp 18   SpO2 97%  Gen:   Awake, no distress   Resp:  Normal effort  MSK:   Moves extremities without difficulty  Other:    Medical Decision Making  Medically screening exam initiated at 12:57 PM.  Appropriate orders placed.  Jacqueline Ball was informed that the remainder of the evaluation will be completed by another provider, this initial triage assessment does not replace that evaluation, and the importance of remaining in the ED until their evaluation is complete.  CBG at 541. Work-up initiated   Jacqueline Ball A, PA-C 07/08/22 1331

## 2022-07-08 NOTE — ED Notes (Signed)
Pt wheel chaired to bathroom.  °

## 2022-07-08 NOTE — ED Triage Notes (Signed)
Needed education on lantus pen.  Educated and demonstrated use of lantus pen

## 2022-07-08 NOTE — ED Provider Notes (Addendum)
[facility] EMERGENCY DEPARTMENT Provider Note   CSN: [id number] Arrival date & time: 07/08/22  1228     History  Chief Complaint  Patient presents with   Medication Reaction    Jacqueline Ball is a 81 y.o. female.  Patient with history of T2DM and dementia presents today with concern for lack of understanding of how to use her home insulin. She presents today with her daughter, both of which are quite poor historians. State that at some point in the recent past, the patient was placed on Lantus insulin pen for management of her diabetes on top of the Metformin she is already prescribed. This is her 4th visit in the last 9 days with similar issues. She has had diabetes education coordinator come see her and has provided teaching for how to use the insulin pen. She has also had home health consulted to come to her home and provide additional assistance, however she has apparently not been answering the phone. Upon my evaluation, she states that she continues to be uncertain how to use this medication and therefore has not been taking it. She is also still not taking her Metformin. She states she doesn't answer the phone to unknown callers and that is why she has not been answering when home health calls. Patient states that she currently denies any somatic complaints including headache, dizziness, nausea, vomiting, abdominal pain, polydipsia, or polyuria.   The history is provided by the patient. No language interpreter was used.       Home Medications Prior to Admission medications   Medication Sig Start Date End Date Taking? Authorizing Provider  ACCU-CHEK GUIDE test strip USE AS DIRECTED UP TO THREE TIMES DAILY 01/16/21   [provider]  acetaminophen (TYLENOL) 500 MG tablet Take 500 mg by mouth every 6 (six) hours as needed for pain.    [provider]  apixaban (ELIQUIS) 5 MG TABS tablet Take 1 tablet (5 mg total) by mouth 2 (two) times daily. 11/24/14    Charolette Forward, MD  atenolol (TENORMIN) 50 MG tablet Take 50 mg by mouth daily. Patient not taking: Reported on 07/06/2022 07/29/19   [provider]  atorvastatin (LIPITOR) 20 MG tablet Take 20 mg by mouth every evening.    [provider]  azelastine (ASTELIN) 0.1 % nasal spray Place 1 spray into both nostrils as needed for rhinitis or allergies.  05/24/18   [provider]  Blood Glucose Monitoring Suppl (ACCU-CHEK GUIDE) w/Device KIT USE AS DIRECTED UP TO THREE TIMES DAILY 01/16/21   [provider]  cyclobenzaprine (FLEXERIL) 5 MG tablet Take 1-2 tablets (5-10 mg total) by mouth at bedtime. Patient not taking: Reported on 07/06/2022 06/06/20   Wieters, Madelynn Done C, PA-C  diclofenac Sodium (VOLTAREN) 1 % GEL Apply one application (2g) up to four times daily as needed for pain. Patient not taking: Reported on 07/06/2022 12/08/21   Geryl Councilman L, PA  donepezil (ARICEPT) 10 MG tablet Take 1 tablet (10 mg total) by mouth at bedtime. 06/06/17   Rosalin Hawking, MD  esomeprazole (NEXIUM) 40 MG capsule Take 40 mg by mouth daily at 12 noon.    [provider]  fluconazole (DIFLUCAN) 150 MG tablet fluconazole 150 mg tablet Patient not taking: Reported on 07/06/2022    [provider]  fluticasone (FLONASE) 50 MCG/ACT nasal spray Place 1 spray into both nostrils daily. Patient taking differently: Place 1 spray into both nostrils daily as needed for allergies. 09/15/18  Bast, Traci A, NP  gabapentin (NEURONTIN) 100 MG capsule Take 100 mg by mouth at bedtime.    [provider]  glimepiride (AMARYL) 4 MG tablet Take 1 tablet (4 mg total) by mouth daily with breakfast. 06/29/22   Drenda Freeze, MD  insulin glargine (LANTUS) 100 UNIT/ML injection Inject 1 Units into the skin every evening.    [provider]  losartan (COZAAR) 25 MG tablet Take 25 mg by mouth daily. Patient not taking: Reported on 07/06/2022 05/03/20   [provider]   metFORMIN (GLUCOPHAGE) 500 MG tablet Take 1 tablet (500 mg total) by mouth 2 (two) times daily with a meal. 06/29/22   Drenda Freeze, MD  metroNIDAZOLE (FLAGYL) 500 MG tablet Take 1 tablet (500 mg total) by mouth 2 (two) times daily. Patient not taking: Reported on 07/06/2022 03/31/22   Lucrezia Starch, MD  mupirocin ointment (BACTROBAN) 2 % Apply 1 application. topically 2 (two) times daily. To affected area till better Patient not taking: Reported on 07/06/2022 05/09/22   Barrett Henle, MD  nystatin cream (MYCOSTATIN) Apply to affected area 2 times daily Patient not taking: Reported on 07/06/2022 04/02/20   Loura Halt A, NP  pantoprazole (PROTONIX) 20 MG tablet Take 1 tablet (20 mg total) by mouth daily. Patient not taking: Reported on 07/06/2022 02/24/20   Marney Setting, NP  traMADol (ULTRAM) 50 MG tablet Take 1 tablet (50 mg total) by mouth 2 (two) times daily as needed for severe pain. for pain Patient not taking: Reported on 07/06/2022 11/23/21   Antonietta Breach, PA-C      Allergies    Metoprolol    Review of Systems   Review of Systems  All other systems reviewed and are negative.   Physical Exam Updated Vital Signs BP (!) 152/108 (BP Location: Right Arm)   Pulse 76   Temp 98.2 F (36.8 C) (Oral)   Resp 18   SpO2 100%  Physical Exam Vitals and nursing note reviewed.  Constitutional:      General: She is not in acute distress.    Appearance: Normal appearance. She is normal weight. She is not ill-appearing, toxic-appearing or diaphoretic.  HENT:     Head: Normocephalic and atraumatic.  Cardiovascular:     Rate and Rhythm: Normal rate and regular rhythm.     Heart sounds: Normal heart sounds.  Pulmonary:     Effort: Pulmonary effort is normal. No respiratory distress.     Breath sounds: Normal breath sounds.  Abdominal:     General: Abdomen is flat.     Palpations: Abdomen is soft.     Tenderness: There is no abdominal tenderness.  Musculoskeletal:         General: Normal range of motion.     Cervical back: Normal range of motion.  Skin:    General: Skin is warm and dry.  Neurological:     General: No focal deficit present.     Mental Status: She is alert.  Psychiatric:        Mood and Affect: Mood normal.        Behavior: Behavior normal.     ED Results / Procedures / Treatments   Labs (all labs ordered are listed, but only abnormal results are displayed) Labs Reviewed  URINALYSIS, ROUTINE W REFLEX MICROSCOPIC - Abnormal; Notable for the following components:      Result Value   Color, Urine STRAW (*)    Glucose, UA >=500 (*)  Bacteria, UA RARE (*)    All other components within normal limits  COMPREHENSIVE METABOLIC PANEL - Abnormal; Notable for the following components:   Sodium 134 (*)    Glucose, Bld 521 (*)    Creatinine, Ser 1.07 (*)    GFR, Estimated 53 (*)    All other components within normal limits  CBG MONITORING, ED - Abnormal; Notable for the following components:   Glucose-Capillary 541 (*)    All other components within normal limits  CBG MONITORING, ED - Abnormal; Notable for the following components:   Glucose-Capillary 253 (*)    All other components within normal limits  CBC WITH DIFFERENTIAL/PLATELET    EKG None  Radiology No results found.  Procedures Procedures    Medications Ordered in ED Medications  sodium chloride 0.9 % bolus 1,000 mL (1,000 mLs Intravenous New Bag/Given 07/08/22 1940)    ED Course/ Medical Decision Making/ A&P                           Medical Decision Making  This patient presents to the ED requesting education on how to use her Lantus insulin pen. Found to have hyperglycemia as well, this involves an extensive number of treatment options, and is a complaint that carries with it a high risk of complications and morbidity.  The differential diagnosis includes DKA/HHS, hyperglycemia   Co morbidities that complicate the patient evaluation  Hx poorly controlled  diabetes   Additional history obtained:  Additional history obtained from epic chart review of previous ER notes   Lab Tests:  I Ordered, and personally interpreted labs.  The pertinent results include:  initial CBG 541 --> 253 after fluids. UA with glucosuria. Creatinine 1.07. No other acute findings   Consultations Obtained:  I requested consultation with the TOC,  and discussed lab and imaging findings as well as pertinent plan - they recommend: they have set up home health to assist with medication, Jenn the home health RN will call the patients family tomorrow to set up an appointment to go to their home tomorrow   Problem List / ED Course / Critical interventions / Medication management  I ordered medication including fluids  for dehydration and hyperglycemia  Reevaluation of the patient after these medicines showed that the patient improved I have reviewed the patients home medicines and have made adjustments as needed   Test / Admission - Considered:  Patient presents today with questions regarding how to use her Lantus insulin. She denies any somatic complaints. She states she has not been taking her Lantus or her Metformin. This is her 4th visit in the past 2 weeks for same. Diabetes care RN has seen the patient today and has provided teaching on medications. TOC was also consulted and home health has been set up to come out to her house starting tomorrow to assist with medications. Patient and family have been informed to answer unknown callers tomorrow as it could be home health calling to schedule an appointment.   Patients labs obtained which are unremarkable for signs of DKA or HHS. Anion gap normal. Blood sugar down to 253. She is stable for discharge at this time, educated on red flag symptoms that would prompt immediate return. Also emphasized the importance of medication adherence as well as close PCP follow-up. I have spend an extended period of time with this patient  and her family explaining Lantus use as well as other home meds, also discussed  several times details about home health and PCP follow-up and emergent symptoms that would prompt return. Family with very poor medical understanding but are understanding and amenable with plan. Patient discharged in stable condition.   This is a shared visit with supervising physician Dr. Tamera Punt who has independently evaluated patient & provided guidance in evaluation/management/disposition, in agreement with care    Final Clinical Impression(s) / ED Diagnoses Final diagnoses:  Hyperglycemia due to diabetes mellitus ([place])  Nonadherence to medication    Rx / DC Orders ED Discharge Orders     None     An After Visit Summary was printed and given to the patient.     Bud Face, PA-C 07/08/22 2250    Jerad Dunlap, Leary Roca, PA-C 07/08/22 2253    Malvin Johns, MD 07/08/22 [phone]

## 2022-07-08 NOTE — Discharge Instructions (Addendum)
It is extremely important that you take your home diabetes medication as prescribed every day to ensure that your blood sugar stays within normal range. We have contacted Home Health who will call you tomorrow to schedule a time to come to your house tomorrow to help with taking your medications. It is very important that you answer the phone when they call. Additionally, I strongly recommend that you follow-up with your PCP in the next week to further discuss long term management of your blood sugar.   Return if development of any new or worsening symptoms

## 2022-07-08 NOTE — Care Management (Signed)
Spoke with Jenn with [facility], they have made several attempts to contact patient to arrange.  They will reach again tomorrow.if patient is discharged to arrange a visit. Information placed on AVS.

## 2022-07-08 NOTE — ED Notes (Signed)
Checked patients CBG 541 and not being discharged at this time.

## 2022-07-08 NOTE — Care Management (Signed)
ED RNCM contacted patient's husband concerning [facility] services which were set up last week with [facility]. According to husband no one has been out for the initial visit.  RNCM is contacting [facility] to find out when will the service is expected to start.

## 2022-08-21 ENCOUNTER — Emergency Department (HOSPITAL_COMMUNITY)
Admission: EM | Admit: 2022-08-21 | Discharge: 2022-08-22 | Disposition: A | Discharge status: Home / Self Care | Payer: Medicare Other | Attending: Emergency Medicine | Admitting: Emergency Medicine

## 2022-08-21 ENCOUNTER — Encounter (HOSPITAL_COMMUNITY): Payer: Self-pay

## 2022-08-21 DIAGNOSIS — Z5321 Procedure and treatment not carried out due to patient leaving prior to being seen by health care provider: Secondary | ICD-10-CM | POA: Diagnosis not present

## 2022-08-21 DIAGNOSIS — M79602 Pain in left arm: Secondary | ICD-10-CM | POA: Insufficient documentation

## 2022-08-21 NOTE — ED Provider Triage Note (Signed)
Emergency Medicine Provider Triage Evaluation Note  Jacqueline Ball , a 81 y.o. female  was evaluated in triage.  Pt complains of left arm pain for numerous months. No neck pain. Denies edema. No known injury. Unable to locate exactly where she is feeling pain. Denies numbness/tingling or weakness.  Review of Systems  Positive: Arm pain Negative: edema  Physical Exam  BP 122/84 (BP Location: Right Arm)   Pulse (!) 52   Temp 98.4 F (36.9 C) (Oral)   Resp 18   Ht 5\' 4"  (1.626 m)   Wt 55.8 kg   SpO2 100%   BMI 21.11 kg/m  Gen:   Awake, no distress   Resp:  Normal effort  MSK:   Moves extremities without difficulty  Other:  Full ROM.  Medical Decision Making  Medically screening exam initiated at 4:53 PM.  Appropriate orders placed.  Gabriele Zwilling was informed that the remainder of the evaluation will be completed by another provider, this initial triage assessment does not replace that evaluation, and the importance of remaining in the ED until their evaluation is complete.    Jacqueline Ball, Jacqueline Ball 08/21/22 1655

## 2022-08-21 NOTE — ED Triage Notes (Signed)
Pt presents with c/o left arm pain. Pt reports that her arm has been hurting her for several months, unsure of any specific injury that occurred that caused the pain.

## 2022-09-11 ENCOUNTER — Emergency Department (HOSPITAL_COMMUNITY)
Admission: EM | Admit: 2022-09-11 | Discharge: 2022-09-12 | Disposition: A | Discharge status: Home / Self Care | Payer: Medicare Other | Attending: Emergency Medicine | Admitting: Emergency Medicine

## 2022-09-11 ENCOUNTER — Encounter (HOSPITAL_COMMUNITY): Payer: Self-pay

## 2022-09-11 DIAGNOSIS — E119 Type 2 diabetes mellitus without complications: Secondary | ICD-10-CM | POA: Insufficient documentation

## 2022-09-11 DIAGNOSIS — I10 Essential (primary) hypertension: Secondary | ICD-10-CM | POA: Diagnosis not present

## 2022-09-11 DIAGNOSIS — M25512 Pain in left shoulder: Secondary | ICD-10-CM | POA: Insufficient documentation

## 2022-09-11 DIAGNOSIS — Z79899 Other long term (current) drug therapy: Secondary | ICD-10-CM | POA: Insufficient documentation

## 2022-09-11 DIAGNOSIS — Z7901 Long term (current) use of anticoagulants: Secondary | ICD-10-CM | POA: Insufficient documentation

## 2022-09-11 DIAGNOSIS — Z7984 Long term (current) use of oral hypoglycemic drugs: Secondary | ICD-10-CM | POA: Diagnosis not present

## 2022-09-11 DIAGNOSIS — Z794 Long term (current) use of insulin: Secondary | ICD-10-CM | POA: Diagnosis not present

## 2022-09-11 DIAGNOSIS — G309 Alzheimer's disease, unspecified: Secondary | ICD-10-CM | POA: Insufficient documentation

## 2022-09-11 NOTE — ED Provider Triage Note (Signed)
Emergency Medicine Provider Triage Evaluation Note  Jacqueline Ball , a 81 y.o. female  was evaluated in triage.  Pt complains of fall which occur approximately 3 months ago.  She reports she does not remember the fall but has continued to have pain along her head.  She was told she "had some sort of brain damage ", she is currently on no blood thinners.  Has not been taking anything for her pain.  She is here to be evaluated with her daughter who is also seeking evaluation.  Review of Systems  Positive: headache Negative: LOC, nausea, vomiting  Physical Exam  BP (!) 142/63 (BP Location: Left Arm)   Pulse (!) 50   Temp 98.1 F (36.7 C) (Oral)   Resp 14   Ht 5\' 4"  (1.626 m)   Wt 57.1 kg   SpO2 100%   BMI 21.59 kg/m  Gen:   Awake, no distress   Resp:  Normal effort  MSK:   Moves extremities without difficulty  Other:  Additional speech, no dysarthria, neuro exam is unremarkable.  Medical Decision Making  Medically screening exam initiated at 4:55 PM.  Appropriate orders placed.  Jacqueline Ball was informed that the remainder of the evaluation will be completed by another provider, this initial triage assessment does not replace that evaluation, and the importance of remaining in the ED until their evaluation is complete.     Jacqueline Fitting, PA-C 09/11/22 1700

## 2022-09-11 NOTE — ED Triage Notes (Signed)
Pt arrived via POV, c/o pain throughout entire body for several months after fall months ago.

## 2022-09-12 ENCOUNTER — Emergency Department (HOSPITAL_COMMUNITY): Payer: Medicare Other

## 2022-09-12 MED ORDER — LIDOCAINE 5 % EX PTCH
1.0000 | MEDICATED_PATCH | CUTANEOUS | Status: DC
  Administered 2022-09-12: 1 via TRANSDERMAL
  Filled 2022-09-12: qty 1

## 2022-09-12 MED ORDER — ACETAMINOPHEN 325 MG PO TABS: 650.0000 mg | ORAL_TABLET | Freq: Once | ORAL | Status: DC

## 2022-09-12 NOTE — ED Provider Notes (Signed)
[facility] DEPT Provider Note   CSN: [id number] Arrival date & time: 09/11/22  1619     History  Chief Complaint  Patient presents with   Pain    Jacqueline Ball is a 81 y.o. female who presents with her husband at the bedside for unclear reason.  She keeps stating that someone sent her to the ER for evaluation and after extensive discussion it appears as a nurse at her primary care doctor.  Patient states that her only concern is ongoing shoulder pain following a fall approximately 6 months ago.  No acute change in this pain, has only taken Tylenol intermittently at home without relief.  Patient very poor historian, history of Alzheimer's, husband at the bedside states that she is at her baseline and he does not contribute any further insight until her presentation to the ED today.  She is very well-appearing.  Both her and her husband deny any recent falls or head trauma.  I personally reviewed her medical records previous history of hypertension, diabetes, Alzheimer's and hyperlipidemia.  History of A-fib but denies current anticoagulation despite Eliquis listed in her patient chart.  HPI     Home Medications Prior to Admission medications   Medication Sig Start Date End Date Taking? Authorizing Provider  ACCU-CHEK GUIDE test strip USE AS DIRECTED UP TO THREE TIMES DAILY 01/16/21   [provider]  acetaminophen (TYLENOL) 500 MG tablet Take 500 mg by mouth every 6 (six) hours as needed for pain.    [provider]  apixaban (ELIQUIS) 5 MG TABS tablet Take 1 tablet (5 mg total) by mouth 2 (two) times daily. 11/24/14   Charolette Forward, MD  atenolol (TENORMIN) 50 MG tablet Take 50 mg by mouth daily. Patient not taking: Reported on 07/06/2022 07/29/19   [provider]  atorvastatin (LIPITOR) 20 MG tablet Take 20 mg by mouth every evening.    [provider]  azelastine (ASTELIN) 0.1 % nasal spray Place 1 spray into both  nostrils as needed for rhinitis or allergies.  05/24/18   [provider]  Blood Glucose Monitoring Suppl (ACCU-CHEK GUIDE) w/Device KIT USE AS DIRECTED UP TO THREE TIMES DAILY 01/16/21   [provider]  cyclobenzaprine (FLEXERIL) 5 MG tablet Take 1-2 tablets (5-10 mg total) by mouth at bedtime. Patient not taking: Reported on 07/06/2022 06/06/20   Wieters, Madelynn Done C, PA-C  diclofenac Sodium (VOLTAREN) 1 % GEL Apply one application (2g) up to four times daily as needed for pain. Patient not taking: Reported on 07/06/2022 12/08/21   Geryl Councilman L, PA  donepezil (ARICEPT) 10 MG tablet Take 1 tablet (10 mg total) by mouth at bedtime. 06/06/17   Rosalin Hawking, MD  esomeprazole (NEXIUM) 40 MG capsule Take 40 mg by mouth daily at 12 noon.    [provider]  fluconazole (DIFLUCAN) 150 MG tablet fluconazole 150 mg tablet Patient not taking: Reported on 07/06/2022    [provider]  fluticasone (FLONASE) 50 MCG/ACT nasal spray Place 1 spray into both nostrils daily. Patient taking differently: Place 1 spray into both nostrils daily as needed for allergies. 09/15/18   Loura Halt A, NP  gabapentin (NEURONTIN) 100 MG capsule Take 100 mg by mouth at bedtime.    [provider]  glimepiride (AMARYL) 4 MG tablet Take 1 tablet (4 mg total) by mouth daily with breakfast. 06/29/22   Drenda Freeze, MD  insulin glargine (LANTUS) 100 UNIT/ML injection Inject 1 Units into the skin  every evening.    [provider]  losartan (COZAAR) 25 MG tablet Take 25 mg by mouth daily. Patient not taking: Reported on 07/06/2022 05/03/20   [provider]  metFORMIN (GLUCOPHAGE) 500 MG tablet Take 1 tablet (500 mg total) by mouth 2 (two) times daily with a meal. 06/29/22   Drenda Freeze, MD  metroNIDAZOLE (FLAGYL) 500 MG tablet Take 1 tablet (500 mg total) by mouth 2 (two) times daily. Patient not taking: Reported on 07/06/2022 03/31/22   Lucrezia Starch, MD  mupirocin  ointment (BACTROBAN) 2 % Apply 1 application. topically 2 (two) times daily. To affected area till better Patient not taking: Reported on 07/06/2022 05/09/22   Barrett Henle, MD  nystatin cream (MYCOSTATIN) Apply to affected area 2 times daily Patient not taking: Reported on 07/06/2022 04/02/20   Loura Halt A, NP  pantoprazole (PROTONIX) 20 MG tablet Take 1 tablet (20 mg total) by mouth daily. Patient not taking: Reported on 07/06/2022 02/24/20   Marney Setting, NP  traMADol (ULTRAM) 50 MG tablet Take 1 tablet (50 mg total) by mouth 2 (two) times daily as needed for severe pain. for pain Patient not taking: Reported on 07/06/2022 11/23/21   Antonietta Breach, PA-C      Allergies    Metoprolol    Review of Systems   Review of Systems  Musculoskeletal:        Left shoulder pain    Physical Exam Updated Vital Signs BP 135/71   Pulse 60   Temp 98.1 F (36.7 C) (Oral)   Resp 16   Ht '5\' 4"'  (1.626 m)   Wt 57.1 kg   SpO2 100%   BMI 21.59 kg/m  Physical Exam Vitals and nursing note reviewed.  Constitutional:      Appearance: She is not ill-appearing or toxic-appearing.  HENT:     Head: Normocephalic and atraumatic.     Mouth/Throat:     Mouth: Mucous membranes are moist.     Pharynx: No oropharyngeal exudate or posterior oropharyngeal erythema.  Eyes:     General:        Right eye: No discharge.        Left eye: No discharge.     Conjunctiva/sclera: Conjunctivae normal.  Cardiovascular:     Rate and Rhythm: Normal rate and regular rhythm.     Pulses: Normal pulses.     Heart sounds: Normal heart sounds. No murmur heard. Pulmonary:     Effort: Pulmonary effort is normal. No respiratory distress.     Breath sounds: Normal breath sounds. No wheezing or rales.  Abdominal:     General: Bowel sounds are normal. There is no distension.     Palpations: Abdomen is soft.     Tenderness: There is no guarding or rebound.  Musculoskeletal:        General: No deformity.     Right  shoulder: Normal.     Left shoulder: Bony tenderness present. No swelling, deformity or tenderness. Normal range of motion.     Right upper arm: Normal.     Left upper arm: Normal.     Right elbow: Normal.     Left elbow: Normal.     Right forearm: Normal.     Left forearm: Normal.     Right wrist: Normal.     Left wrist: Normal.     Right hand: Normal.     Left hand: Normal.     Cervical back: Neck supple.  Skin:  General: Skin is warm and dry.     Capillary Refill: Capillary refill takes less than 2 seconds.  Neurological:     Mental Status: She is alert. Mental status is at baseline.  Psychiatric:        Mood and Affect: Mood normal.     ED Results / Procedures / Treatments   Labs (all labs ordered are listed, but only abnormal results are displayed) Labs Reviewed - No data to display  EKG None  Radiology DG Shoulder Left  Result Date: 09/12/2022 CLINICAL DATA:  Left shoulder pain after fall 3 months ago EXAM: LEFT SHOULDER - 2+ VIEW COMPARISON:  Radiographs 01/29/2022 FINDINGS: No acute fracture or dislocation. 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 Unremarkable soft tissues. IMPRESSION: Mild degenerative change.  No acute fracture or dislocation. Electronically Signed   By: Placido Sou M.D.   On: 09/12/2022 01:00    Procedures Procedures    Medications Ordered in ED Medications  lidocaine (LIDODERM) 5 % 1 patch (has no administration in time range)    ED Course/ Medical Decision Making/ A&P                           Medical Decision Making 77 female with Alzheimer's who presents with concern for ongoing left shoulder pain after fall several months ago.  Hypertensive on intake and initial bradycardia, now resolved.  Cardiopulmonary exam is normal at time of my evaluation, abdominal exam is benign.  No evidence of acute injuries to the extremities.  Mild bony tenderness palpation of the left shoulder.  Otherwise reassuring  exam.  Differential diagnosis includes limited to arthritis, acute fracture or dislocation, ligamentous or tendinous injury to the joint, muscle spasm.  Amount and/or Complexity of Data Reviewed Radiology: ordered.    Details: No acute fracture or dislocation in the shoulder, images visualized by this provider.   Work-up today reassuring, patient without clear acute problem inciting her presentation to the ED.  No evidence of traumatic injury to the shoulder this time, suspect pain is secondary to ongoing degenerative changes.  Recommend Tylenol at home and close outpatient follow-up.  No further work-up warranted in ED at this time.  Patient at her cognitive baseline without evidence of trauma to the head.  Recommend close outpatient follow-up; clinical concern for emergent underlying etiology that warrant further ED work-up or inpatient management is exceedingly low.  Jacqueline Ball and her husband  voiced understanding of her medical evaluation and treatment plan. Each of their questions answered to their expressed satisfaction.  Return precautions were given.  Patient is well-appearing, stable, and was discharged in good condition.  This chart was dictated using voice recognition software, Dragon. Despite the best efforts of this provider to proofread and correct errors, errors may still occur which can change documentation meaning.   Final Clinical Impression(s) / ED Diagnoses Final diagnoses:  None    Rx / DC Orders ED Discharge Orders     None         Aura Dials 09/12/22 0104    Fatima Blank, MD 09/12/22 [phone]

## 2022-09-12 NOTE — Discharge Instructions (Signed)
You are seen in the ER today for your shoulder pain.  Your x-ray was normal.  Suspect your pain is related to arthritis in your shoulders.  You may use over-the-counter Tylenol or topical pain relief such as Biofreeze, IcyHot, or lidocaine patches.  May follow-up with your primary care doctor and return to the ER with any new severe symptoms.

## 2022-11-22 IMAGING — DX DG ELBOW COMPLETE 3+V*R*
4 series · 4 of 4 positions shown · non-contrast
Comparison: None.

CLINICAL DATA: Pain.  Ongoing for 4 days.

EXAM:
RIGHT ELBOW - COMPLETE 3+ VIEW

[elbow ap]
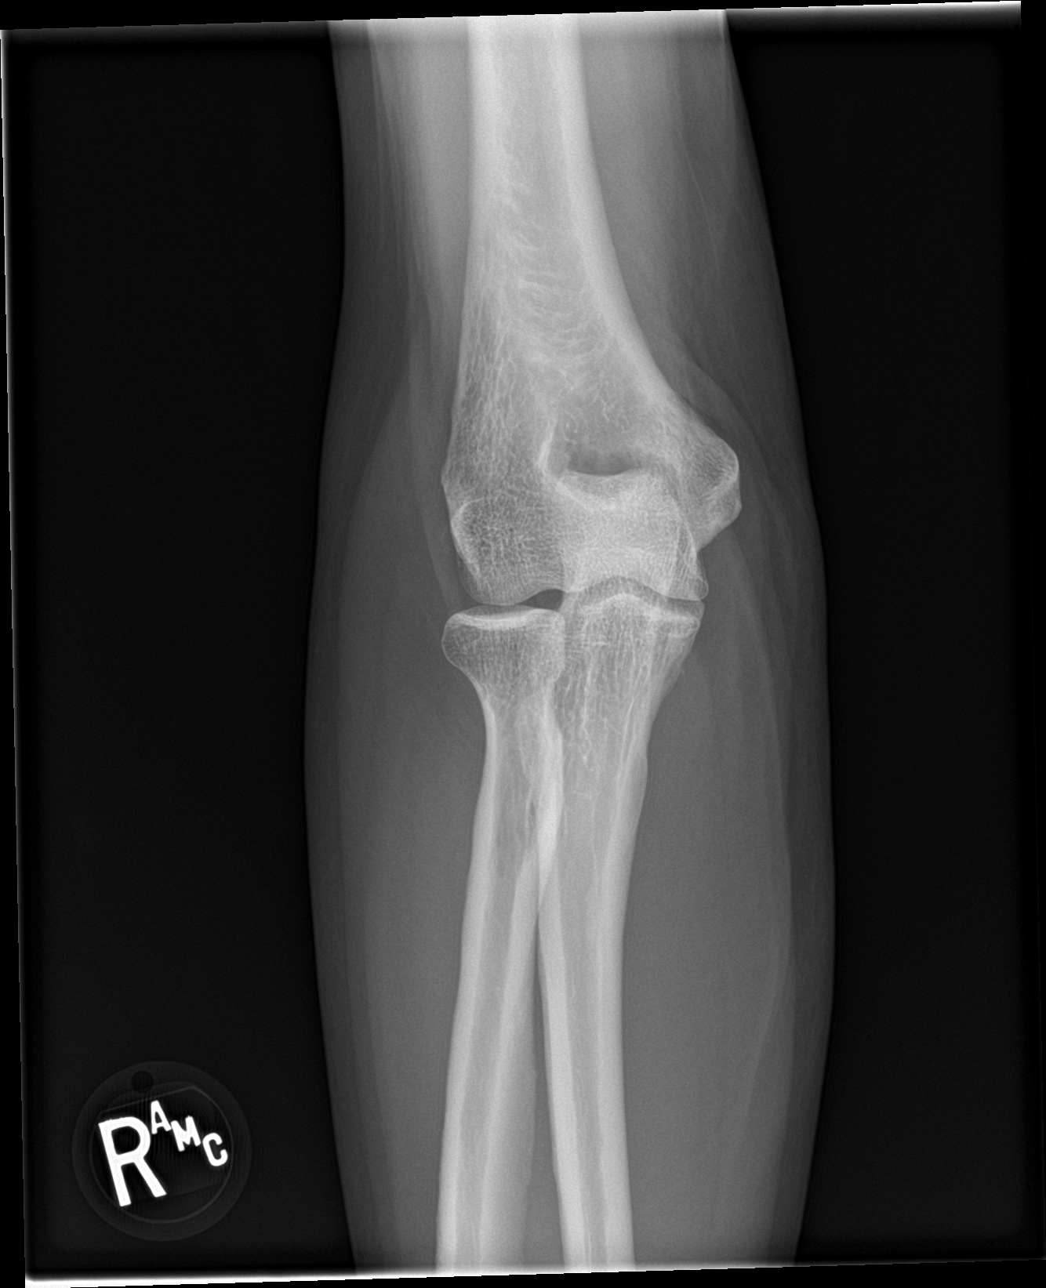

[elbow obl (1 of 2)]
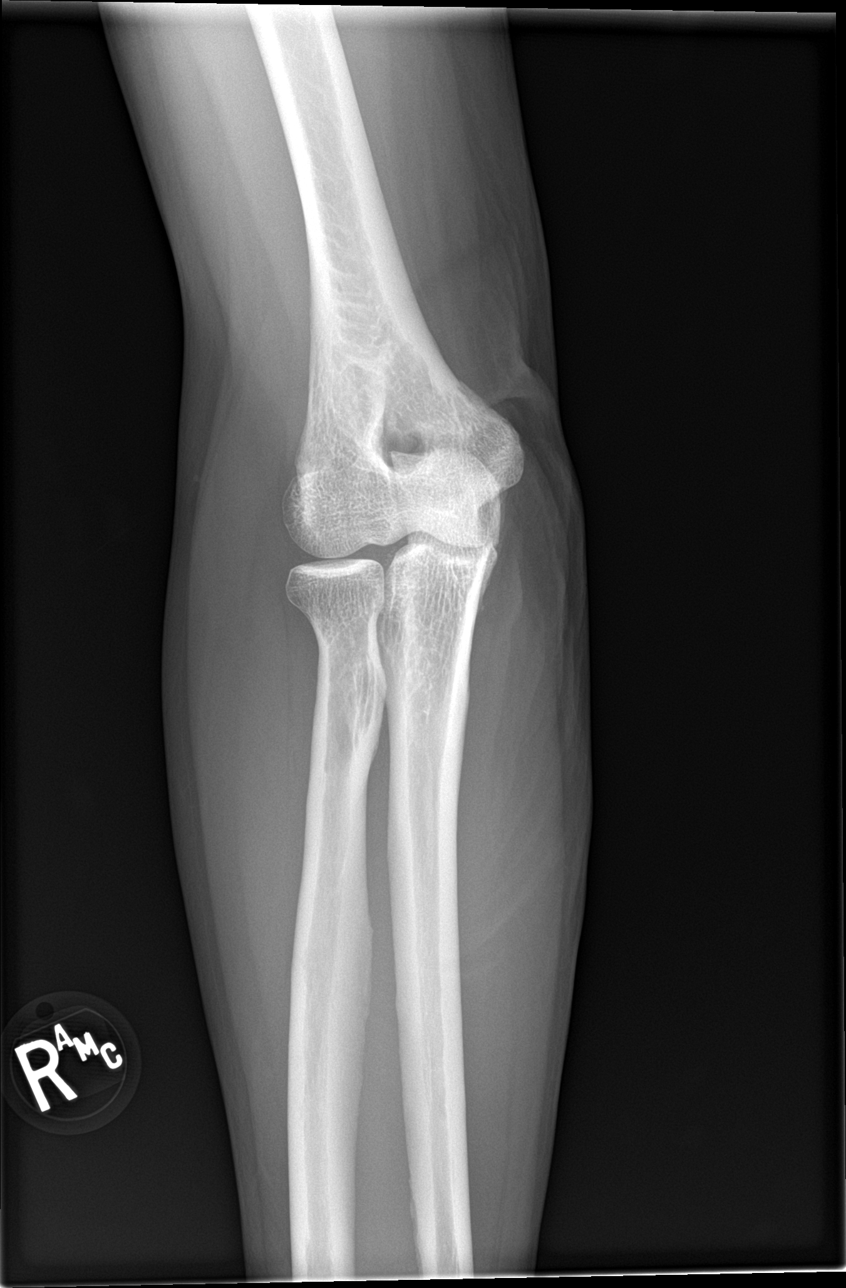

[elbow obl (2 of 2)]
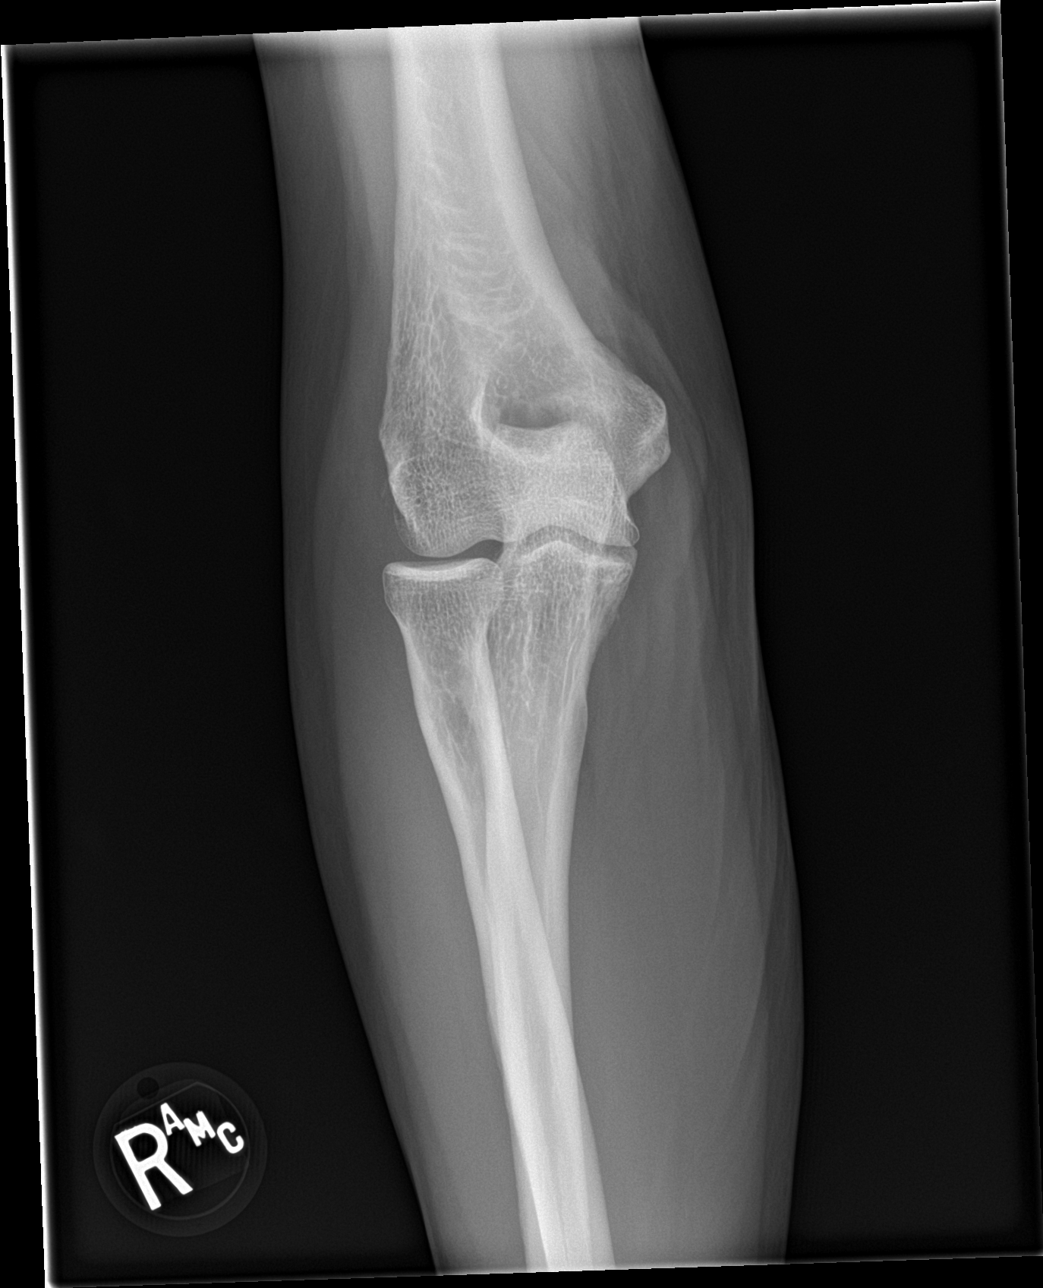

[elbow lat]
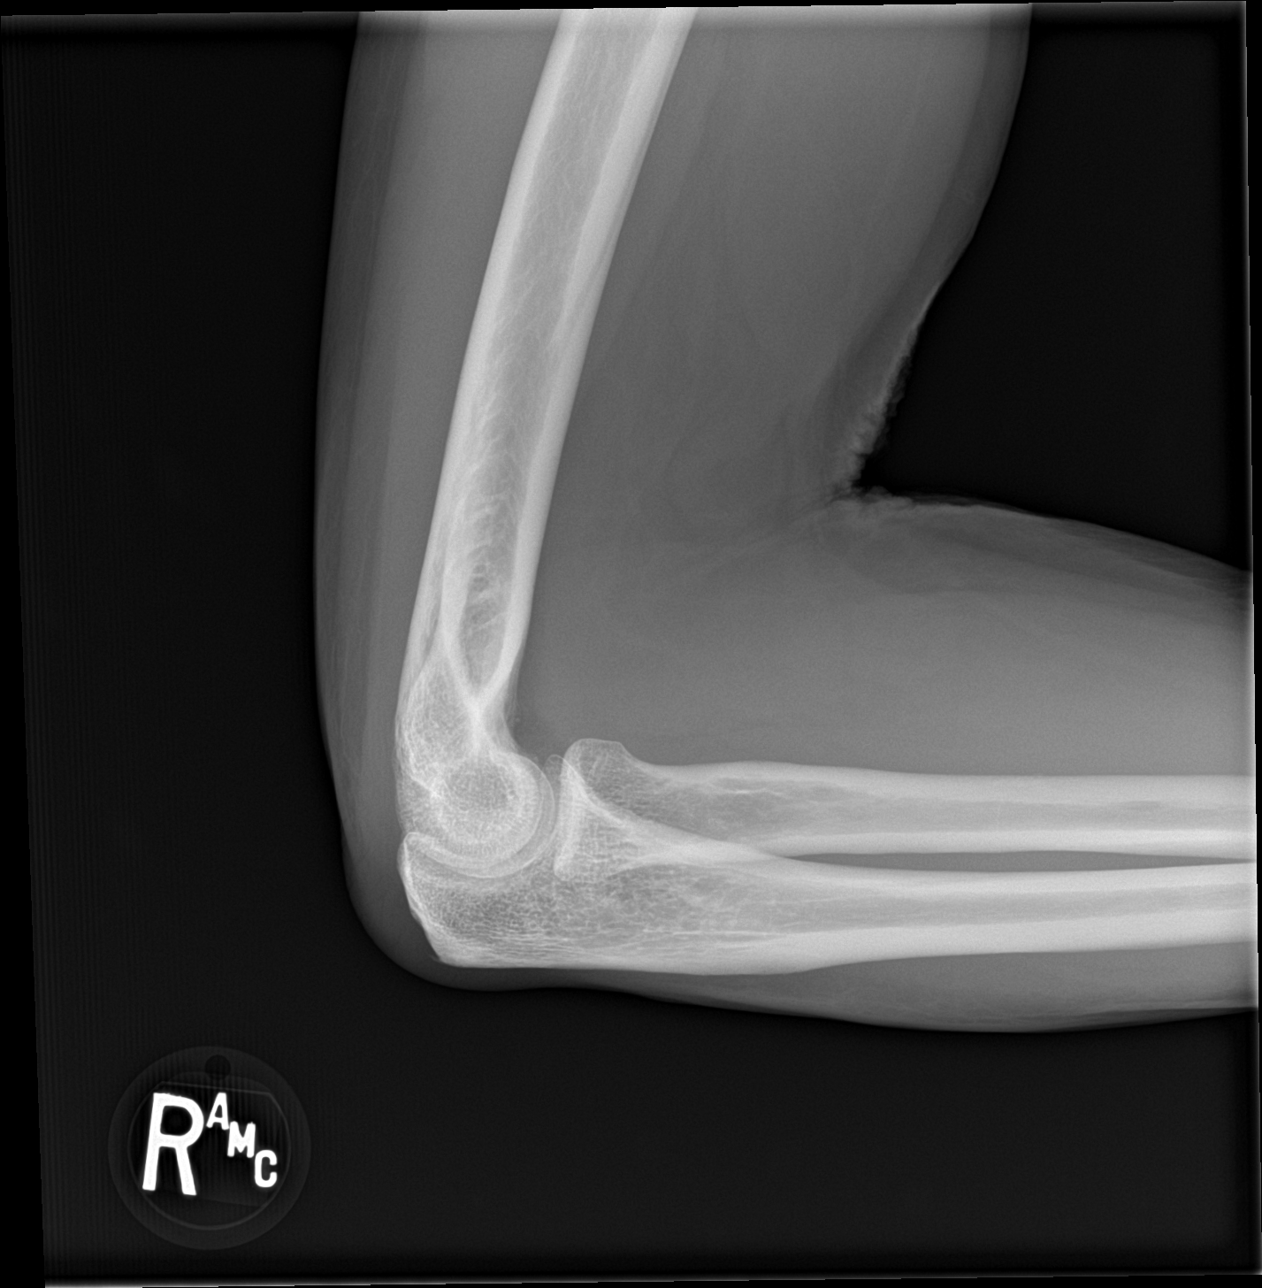

[4 of 4 positions shown; findings below may reference images not displayed]

FINDINGS: There is no evidence of fracture, dislocation, or joint effusion.
There is no evidence of arthropathy or other focal bone abnormality.
Soft tissues are unremarkable.
IMPRESSION: Negative.

## 2022-12-24 DIAGNOSIS — Z23 Encounter for immunization: Secondary | ICD-10-CM | POA: Diagnosis not present

## 2022-12-24 DIAGNOSIS — E1142 Type 2 diabetes mellitus with diabetic polyneuropathy: Secondary | ICD-10-CM | POA: Diagnosis not present

## 2022-12-24 DIAGNOSIS — Z79899 Other long term (current) drug therapy: Secondary | ICD-10-CM | POA: Diagnosis not present

## 2022-12-24 DIAGNOSIS — I10 Essential (primary) hypertension: Secondary | ICD-10-CM | POA: Diagnosis not present

## 2022-12-27 DIAGNOSIS — Z79899 Other long term (current) drug therapy: Secondary | ICD-10-CM | POA: Diagnosis not present

## 2022-12-28 ENCOUNTER — Emergency Department (HOSPITAL_COMMUNITY)
Admission: EM | Admit: 2022-12-28 | Discharge: 2022-12-29 | Disposition: A | Discharge status: Home / Self Care | Payer: 59 | Attending: Emergency Medicine | Admitting: Emergency Medicine

## 2022-12-28 ENCOUNTER — Emergency Department (HOSPITAL_COMMUNITY): Payer: 59

## 2022-12-28 ENCOUNTER — Other Ambulatory Visit: Payer: Self-pay

## 2022-12-28 DIAGNOSIS — G9389 Other specified disorders of brain: Secondary | ICD-10-CM | POA: Diagnosis not present

## 2022-12-28 DIAGNOSIS — R001 Bradycardia, unspecified: Secondary | ICD-10-CM | POA: Diagnosis not present

## 2022-12-28 DIAGNOSIS — I6521 Occlusion and stenosis of right carotid artery: Secondary | ICD-10-CM | POA: Diagnosis not present

## 2022-12-28 DIAGNOSIS — E119 Type 2 diabetes mellitus without complications: Secondary | ICD-10-CM | POA: Insufficient documentation

## 2022-12-28 DIAGNOSIS — S199XXA Unspecified injury of neck, initial encounter: Secondary | ICD-10-CM | POA: Diagnosis not present

## 2022-12-28 DIAGNOSIS — Z794 Long term (current) use of insulin: Secondary | ICD-10-CM | POA: Diagnosis not present

## 2022-12-28 DIAGNOSIS — Z7984 Long term (current) use of oral hypoglycemic drugs: Secondary | ICD-10-CM | POA: Insufficient documentation

## 2022-12-28 DIAGNOSIS — G309 Alzheimer's disease, unspecified: Secondary | ICD-10-CM | POA: Diagnosis not present

## 2022-12-28 DIAGNOSIS — M79602 Pain in left arm: Secondary | ICD-10-CM | POA: Diagnosis not present

## 2022-12-28 DIAGNOSIS — Z7901 Long term (current) use of anticoagulants: Secondary | ICD-10-CM | POA: Insufficient documentation

## 2022-12-28 DIAGNOSIS — S022XXA Fracture of nasal bones, initial encounter for closed fracture: Secondary | ICD-10-CM | POA: Diagnosis not present

## 2022-12-28 DIAGNOSIS — M79601 Pain in right arm: Secondary | ICD-10-CM | POA: Insufficient documentation

## 2022-12-28 DIAGNOSIS — M8448XA Pathological fracture, other site, initial encounter for fracture: Secondary | ICD-10-CM | POA: Diagnosis not present

## 2022-12-28 DIAGNOSIS — M47812 Spondylosis without myelopathy or radiculopathy, cervical region: Secondary | ICD-10-CM | POA: Diagnosis not present

## 2022-12-28 DIAGNOSIS — R9082 White matter disease, unspecified: Secondary | ICD-10-CM | POA: Diagnosis not present

## 2022-12-28 LAB — CBC
HCT: 35.3 % — ABNORMAL LOW (ref 36.0–46.0)
Hemoglobin: 12.2 g/dL (ref 12.0–15.0)
MCH: 33 pg (ref 26.0–34.0)
MCHC: 34.6 g/dL (ref 30.0–36.0)
MCV: 95.4 fL (ref 80.0–100.0)
Platelets: 273 10*3/uL (ref 150–400)
RBC: 3.7 MIL/uL — ABNORMAL LOW (ref 3.87–5.11)
RDW: 12.3 % (ref 11.5–15.5)
WBC: 7.1 10*3/uL (ref 4.0–10.5)
nRBC: 0 % (ref 0.0–0.2)

## 2022-12-28 LAB — BASIC METABOLIC PANEL
Anion gap: 6 (ref 5–15)
BUN: 10 mg/dL (ref 8–23)
CO2: 27 mmol/L (ref 22–32)
Calcium: 9.4 mg/dL (ref 8.9–10.3)
Chloride: 106 mmol/L (ref 98–111)
Creatinine, Ser: 0.75 mg/dL (ref 0.44–1.00)
GFR, Estimated: >60 mL/min (ref >60)
Glucose, Bld: 96 mg/dL (ref 70–99)
Potassium: 3.7 mmol/L (ref 3.5–5.1)
Sodium: 139 mmol/L (ref 135–145)

## 2022-12-28 LAB — CBG MONITORING, ED: Glucose-Capillary: 91 mg/dL (ref 70–99)

## 2022-12-28 NOTE — ED Provider Triage Note (Signed)
Emergency Medicine Provider Triage Evaluation Note  Jacqueline Ball , a 82 y.o. female  was evaluated in triage.  Pt complains of bilateral arm pain, hand pain, after fall, syncopal episode.  Patient reports that it was maybe a week or 2 ago, she cannot quite recall.  She denies any chest pain, shortness of breath, numbness, tingling. Blood sugar well controlled per patient.  Review of Systems  Positive: Head injury, syncope, arm pain Negative: Numbness, tingling  Physical Exam  BP (!) 141/69   Pulse (!) 51   Temp 97.7 F (36.5 C)   Resp 16   Ht 5\' 4"  (1.626 m)   Wt 59 kg   SpO2 99%   BMI 22.31 kg/m  Gen:   Awake, no distress   Resp:  Normal effort  MSK:   Moves extremities without difficulty  Other:  Ttp in cervical paraspinous muscles normal rom of all extremities throughout  Medical Decision Making  Medically screening exam initiated at 7:20 PM.  Appropriate orders placed.  Jacqueline Ball was informed that the remainder of the evaluation will be completed by another provider, this initial triage assessment does not replace that evaluation, and the importance of remaining in the ED until their evaluation is complete.  Workup initiated   Jacqueline Ball 12/28/22 1920

## 2022-12-28 NOTE — ED Triage Notes (Addendum)
Pt c/o bilateral arm and hand pain x several weeks, a "nagging pain" that wakes pt from sleep; endorses fall several weeks ago and pain began after that ; denies cp, sob, N/V

## 2022-12-29 MED ORDER — HYDROCODONE-ACETAMINOPHEN 5-325 MG PO TABS
1.0000 | ORAL_TABLET | Freq: Once | ORAL | Status: AC
  Administered 2022-12-29: 1 via ORAL
  Filled 2022-12-29: qty 1

## 2022-12-29 NOTE — Discharge Instructions (Signed)
You were seen here today for your arm pain.  Your physical exam vital signs blood work and imaging were very reassuring.  While the exact cause of your symptoms remains unclear there is not appear to be any emergent problem at this time.  Please follow-up with your primary care physicians at [facility] for reevaluation, you may continue to take Tylenol as needed and return to the ER with any new severe symptoms.

## 2022-12-29 NOTE — ED Provider Notes (Addendum)
[facility] EMERGENCY DEPARTMENT Provider Note   CSN: [id number] Arrival date & time: 12/28/22  1729     History  Chief Complaint  Patient presents with   Arm Pain    Jacqueline Ball is a 82 y.o. female who presents with concern for bilateral upper extremity pain that is achy in nature and only occurs at night, sometimes waking her from her sleep.  This been going on for several weeks.  She saw her primary care doctor yesterday but did not mention this problem to them.  No numbness tingling or weakness in the arms.  history of the same.    Patient has history of type 2 diabetes, Alzheimer's, chronic A-fib anticoagulated on Eliquis.  Patient did have a fall several weeks ago for which she was evaluated with reassuring workup.  No recurrent falls since that time.  According to her husband who is at the bedside patient will not take any Tylenol at home for this pain. No hx of parasthesias or neuropathy with DMT2.  HPI     Home Medications Prior to Admission medications   Medication Sig Start Date End Date Taking? Authorizing Provider  ACCU-CHEK GUIDE test strip USE AS DIRECTED UP TO THREE TIMES DAILY 01/16/21   [provider]  acetaminophen (TYLENOL) 500 MG tablet Take 500 mg by mouth every 6 (six) hours as needed for pain.    [provider]  apixaban (ELIQUIS) 5 MG TABS tablet Take 1 tablet (5 mg total) by mouth 2 (two) times daily. 11/24/14   Rinaldo Cloud, MD  atenolol (TENORMIN) 50 MG tablet Take 50 mg by mouth daily. Patient not taking: Reported on 07/06/2022 07/29/19   [provider]  atorvastatin (LIPITOR) 20 MG tablet Take 20 mg by mouth every evening.    [provider]  azelastine (ASTELIN) 0.1 % nasal spray Place 1 spray into both nostrils as needed for rhinitis or allergies.  05/24/18   [provider]  Blood Glucose Monitoring Suppl (ACCU-CHEK GUIDE) w/Device KIT USE AS DIRECTED UP TO THREE TIMES DAILY 01/16/21    [provider]  cyclobenzaprine (FLEXERIL) 5 MG tablet Take 1-2 tablets (5-10 mg total) by mouth at bedtime. Patient not taking: Reported on 07/06/2022 06/06/20   Wieters, Fran Lowes C, PA-C  diclofenac Sodium (VOLTAREN) 1 % GEL Apply one application (2g) up to four times daily as needed for pain. Patient not taking: Reported on 07/06/2022 12/08/21   Guy Sandifer L, PA  donepezil (ARICEPT) 10 MG tablet Take 1 tablet (10 mg total) by mouth at bedtime. 06/06/17   Marvel Plan, MD  esomeprazole (NEXIUM) 40 MG capsule Take 40 mg by mouth daily at 12 noon.    [provider]  fluconazole (DIFLUCAN) 150 MG tablet fluconazole 150 mg tablet Patient not taking: Reported on 07/06/2022    [provider]  fluticasone (FLONASE) 50 MCG/ACT nasal spray Place 1 spray into both nostrils daily. Patient taking differently: Place 1 spray into both nostrils daily as needed for allergies. 09/15/18   Dahlia Byes A, NP  gabapentin (NEURONTIN) 100 MG capsule Take 100 mg by mouth at bedtime.    [provider]  glimepiride (AMARYL) 4 MG tablet Take 1 tablet (4 mg total) by mouth daily with breakfast. 06/29/22   Charlynne Pander, MD  insulin glargine (LANTUS) 100 UNIT/ML injection Inject 1 Units into the skin every evening.    [provider]  losartan (COZAAR) 25 MG tablet Take 25 mg by mouth daily.  Patient not taking: Reported on 07/06/2022 05/03/20   [provider]  metFORMIN (GLUCOPHAGE) 500 MG tablet Take 1 tablet (500 mg total) by mouth 2 (two) times daily with a meal. 06/29/22   Drenda Freeze, MD  metroNIDAZOLE (FLAGYL) 500 MG tablet Take 1 tablet (500 mg total) by mouth 2 (two) times daily. Patient not taking: Reported on 07/06/2022 03/31/22   Lucrezia Starch, MD  mupirocin ointment (BACTROBAN) 2 % Apply 1 application. topically 2 (two) times daily. To affected area till better Patient not taking: Reported on 07/06/2022 05/09/22   Barrett Henle, MD  nystatin  cream (MYCOSTATIN) Apply to affected area 2 times daily Patient not taking: Reported on 07/06/2022 04/02/20   Loura Halt A, NP  pantoprazole (PROTONIX) 20 MG tablet Take 1 tablet (20 mg total) by mouth daily. Patient not taking: Reported on 07/06/2022 02/24/20   Marney Setting, NP  traMADol (ULTRAM) 50 MG tablet Take 1 tablet (50 mg total) by mouth 2 (two) times daily as needed for severe pain. for pain Patient not taking: Reported on 07/06/2022 11/23/21   Antonietta Breach, PA-C      Allergies    Metoprolol    Review of Systems   Review of Systems  Musculoskeletal:        Bilateral arm pain    Physical Exam Updated Vital Signs BP 132/74   Pulse (!) 50   Temp 98.1 F (36.7 C)   Resp 18   Ht 5\' 4"  (1.626 m)   Wt 59 kg   SpO2 97%   BMI 22.31 kg/m  Physical Exam Vitals and nursing note reviewed.  Constitutional:      Appearance: She is not ill-appearing or toxic-appearing.  HENT:     Head: Normocephalic and atraumatic.     Nose: Nose normal.     Mouth/Throat:     Mouth: Mucous membranes are moist.     Pharynx: No oropharyngeal exudate or posterior oropharyngeal erythema.  Eyes:     General:        Right eye: No discharge.        Left eye: No discharge.     Extraocular Movements: Extraocular movements intact.     Conjunctiva/sclera: Conjunctivae normal.     Pupils: Pupils are equal, round, and reactive to light.  Neck:     Trachea: Trachea and phonation normal.  Cardiovascular:     Rate and Rhythm: Normal rate and regular rhythm.     Pulses: Normal pulses.          Radial pulses are 2+ on the right side and 2+ on the left side.     Heart sounds: Normal heart sounds. No murmur heard. Pulmonary:     Effort: Pulmonary effort is normal. No respiratory distress.     Breath sounds: Normal breath sounds. No wheezing or rales.  Abdominal:     General: Bowel sounds are normal. There is no distension.     Palpations: Abdomen is soft.     Tenderness: There is no abdominal  tenderness. There is no guarding or rebound.  Musculoskeletal:        General: No deformity.     Right shoulder: Normal.     Left shoulder: Normal.     Right upper arm: Normal.     Left upper arm: Normal.     Right elbow: Normal.     Left elbow: Normal.     Right forearm: Normal.     Left forearm: Normal.  Right wrist: Normal.     Left wrist: Normal.     Right hand: Normal.     Left hand: Normal.     Cervical back: Normal range of motion and neck supple. No pain with movement, spinous process tenderness or muscular tenderness.     Right lower leg: No edema.     Left lower leg: No edema.  Lymphadenopathy:     Cervical: No cervical adenopathy.  Skin:    General: Skin is warm and dry.     Capillary Refill: Capillary refill takes less than 2 seconds.  Neurological:     General: No focal deficit present.     Mental Status: She is alert and oriented to person, place, and time. Mental status is at baseline.     GCS: GCS eye subscore is 4. GCS verbal subscore is 5. GCS motor subscore is 6.     Cranial Nerves: Cranial nerves 2-12 are intact.     Sensory: Sensation is intact.     Motor: Motor function is intact.     Coordination: Coordination is intact.  Psychiatric:        Mood and Affect: Mood normal.     ED Results / Procedures / Treatments   Labs (all labs ordered are listed, but only abnormal results are displayed) Labs Reviewed  CBC - Abnormal; Notable for the following components:      Result Value   RBC 3.70 (*)    HCT 35.3 (*)    All other components within normal limits  BASIC METABOLIC PANEL  CBG MONITORING, ED    EKG None  Radiology CT Head Wo Contrast  Result Date: 12/28/2022 CLINICAL DATA:  Head and neck trauma. EXAM: CT HEAD WITHOUT CONTRAST CT CERVICAL SPINE WITHOUT CONTRAST TECHNIQUE: Multidetector CT imaging of the head and cervical spine was performed following the standard protocol without intravenous contrast. Multiplanar CT image reconstructions  of the cervical spine were also generated. RADIATION DOSE REDUCTION: This exam was performed according to the departmental dose-optimization program which includes automated exposure control, adjustment of the mA and/or kV according to patient size and/or use of iterative reconstruction technique. COMPARISON:  Head CT dated 07/13/2022, cervical spine CT dated 11/23/2021. FINDINGS: CT HEAD FINDINGS Brain: There is mild-to-moderate global atrophy, with atrophic ventriculomegaly and mild small vessel disease of the cerebral white matter. No midline shift is seen. No new asymmetry worrisome for an acute infarct, hemorrhage or mass. The basal cisterns are clear. There are benign dural calcifications in the frontal falx. Vascular: No hyperdense vessel or unexpected calcification. Skull: Negative for fractures or focal lesions. Sinuses/Orbits: There are old lens extractions. Otherwise negative orbits. Paranasal sinuses and mastoid air cells are clear. There is a chronic fracture deformity of the nasal bone. Other: None. CT CERVICAL SPINE FINDINGS Alignment: Unchanged 2 mm anterolisthesis C4-5, 2 mm retrolisthesis C5-6, and trace retrolisthesis C6-7 all likely degenerative and stable. No traumatic or further listhesis is seen or new alignment abnormality. There is slightly reversed cervical Lordosis. Narrowing and osteophytes of the anterior atlantodental joint are again shown, calcified pannus in the posterior joint. Skull base and vertebrae: There is mild osteopenia without evidence of acute fractures. 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 Discogenic endplate sclerosis appears similar again noted at C3-4 and C5-6 but no primary pathologic bone lesion is seen. Soft tissues and spinal canal: No prevertebral fluid or swelling. No visible canal hematoma. There is no thyroid or laryngeal mass. Trace calcification  proximal right cervical ICA. No left carotid calcific plaques. Disc levels:  chronic disc space loss all levels except C2-3 and C7-T1. There are bidirectional endplate osteophytes Y1-0 through C6-7 mildly effacing the ventral CSF but without mass effect on the cord. There are no herniated discs or cord compromise. Mild uncinate joint and facet spurring is seen with mild foraminal stenosis at C4-5 and C5-6, unchanged. Other foramina do not show stenosis. Upper chest: Negative. Other: None. IMPRESSION: 1. No acute intracranial CT findings or depressed skull fractures. 2. Chronic changes. 3. Osteopenia and degenerative changes of the cervical spine without evidence of acute fractures or interval changes. 4. 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 5. Detailed findings discussed above. Electronically Signed   By: Telford Nab M.D.   On: 12/28/2022 20:33   CT Cervical Spine Wo Contrast  Result Date: 12/28/2022 CLINICAL DATA:  Head and neck trauma. EXAM: CT HEAD WITHOUT CONTRAST CT CERVICAL SPINE WITHOUT CONTRAST TECHNIQUE: Multidetector CT imaging of the head and cervical spine was performed following the standard protocol without intravenous contrast. Multiplanar CT image reconstructions of the cervical spine were also generated. RADIATION DOSE REDUCTION: This exam was performed according to the departmental dose-optimization program which includes automated exposure control, adjustment of the mA and/or kV according to patient size and/or use of iterative reconstruction technique. COMPARISON:  Head CT dated 07/13/2022, cervical spine CT dated 11/23/2021. FINDINGS: CT HEAD FINDINGS Brain: There is mild-to-moderate global atrophy, with atrophic ventriculomegaly and mild small vessel disease of the cerebral white matter. No midline shift is seen. No new asymmetry worrisome for an acute infarct, hemorrhage or mass. The basal cisterns are clear. There are benign dural calcifications in the frontal falx. Vascular: No hyperdense vessel or unexpected calcification. Skull: Negative for  fractures or focal lesions. Sinuses/Orbits: There are old lens extractions. Otherwise negative orbits. Paranasal sinuses and mastoid air cells are clear. There is a chronic fracture deformity of the nasal bone. Other: None. CT CERVICAL SPINE FINDINGS Alignment: Unchanged 2 mm anterolisthesis C4-5, 2 mm retrolisthesis C5-6, and trace retrolisthesis C6-7 all likely degenerative and stable. No traumatic or further listhesis is seen or new alignment abnormality. There is slightly reversed cervical Lordosis. Narrowing and osteophytes of the anterior atlantodental joint are again shown, calcified pannus in the posterior joint. Skull base and vertebrae: There is mild osteopenia without evidence of acute fractures. 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 Discogenic endplate sclerosis appears similar again noted at C3-4 and C5-6 but no primary pathologic bone lesion is seen. Soft tissues and spinal canal: No prevertebral fluid or swelling. No visible canal hematoma. There is no thyroid or laryngeal mass. Trace calcification proximal right cervical ICA. No left carotid calcific plaques. Disc levels: chronic disc space loss all levels except C2-3 and C7-T1. There are bidirectional endplate osteophytes F7-5 through C6-7 mildly effacing the ventral CSF but without mass effect on the cord. There are no herniated discs or cord compromise. Mild uncinate joint and facet spurring is seen with mild foraminal stenosis at C4-5 and C5-6, unchanged. Other foramina do not show stenosis. Upper chest: Negative. Other: None. IMPRESSION: 1. No acute intracranial CT findings or depressed skull fractures. 2. Chronic changes. 3. Osteopenia and degenerative changes of the cervical spine without evidence of acute fractures or interval changes. 4. 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 5. Detailed findings discussed above. Electronically Signed   By: Telford Nab M.D.   On: 12/28/2022 20:33  Procedures Procedures    Medications Ordered in ED Medications  HYDROcodone-acetaminophen (NORCO/VICODIN) 5-325 MG per tablet 1 tablet (1 tablet Oral Given 12/29/22 0254)    ED Course/ Medical Decision Making/ A&P Clinical Course as of 12/29/22 0321  Sat Dec 29, 2022  0047 CT Head Wo Contrast [RS]    Clinical Course User Index [RS] Paris Lore, PA-C                             Medical Decision Making 82 year old female with bilateral arm pain as above.  Vital signs reassuring on intake, cardiopulmonary abdominal exam benign.  Neurologic exam without focal deficit.  MSK exam without acute abnormality.  Normal sensation and vascular status in the extremities.    Amount and/or Complexity of Data Reviewed Labs:     Details: CBC without leukocytosis or anemia, BMP unremarkable Radiology:  Decision-making details documented in ED Course.    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    ECG/medicine tests:     Details: EKG with sinus brady, no STEMI.   Risk Prescription drug management.   Will exact etiology of this patient's remains unclear does not appear to be any emergent problem at this time.  Question possible diabetic neuropathy, doubt cervical radiculopathy given patient does not have any neck pain or radicular symptoms such as tingling or weakness in the arms. Clinical concern for emergent underlying etiology that would warrant further ED workup or inpatient management is exceedingly low.   Euline and her family voiced understanding of her medical evaluation and treatment plan. Each of their questions answered to their expressed satisfaction.  Return precautions were given.  Patient is well-appearing, stable, and was discharged in good condition.  This chart was dictated using voice recognition software, Dragon. Despite the best efforts of this provider to proofread and  correct errors, errors may still occur which can change documentation meaning.  Final Clinical Impression(s) / ED Diagnoses Final diagnoses:  Bilateral arm pain    Rx / DC Orders ED Discharge Orders     None         Paris Lore, PA-C 12/29/22 0321    Albion Weatherholtz, Eugene Gavia, PA-C 12/29/22 0322    Sloan Leiter, DO 12/29/22 0820

## 2023-01-21 DIAGNOSIS — Z794 Long term (current) use of insulin: Secondary | ICD-10-CM | POA: Diagnosis not present

## 2023-01-21 DIAGNOSIS — E1142 Type 2 diabetes mellitus with diabetic polyneuropathy: Secondary | ICD-10-CM | POA: Diagnosis not present

## 2023-02-01 ENCOUNTER — Encounter (HOSPITAL_COMMUNITY): Payer: Self-pay

## 2023-02-01 ENCOUNTER — Emergency Department (HOSPITAL_COMMUNITY)
Admission: EM | Admit: 2023-02-01 | Discharge: 2023-02-01 | Disposition: A | Discharge status: Home / Self Care | Payer: Medicare HMO | Attending: Emergency Medicine | Admitting: Emergency Medicine

## 2023-02-01 ENCOUNTER — Emergency Department (HOSPITAL_COMMUNITY): Payer: Medicare HMO

## 2023-02-01 DIAGNOSIS — W19XXXA Unspecified fall, initial encounter: Secondary | ICD-10-CM | POA: Insufficient documentation

## 2023-02-01 DIAGNOSIS — R55 Syncope and collapse: Secondary | ICD-10-CM | POA: Diagnosis not present

## 2023-02-01 DIAGNOSIS — R42 Dizziness and giddiness: Secondary | ICD-10-CM | POA: Diagnosis not present

## 2023-02-01 DIAGNOSIS — R4182 Altered mental status, unspecified: Secondary | ICD-10-CM | POA: Diagnosis not present

## 2023-02-01 LAB — CBC
HCT: 37.6 % (ref 36.0–46.0)
Hemoglobin: 12.9 g/dL (ref 12.0–15.0)
MCH: 33.2 pg (ref 26.0–34.0)
MCHC: 34.3 g/dL (ref 30.0–36.0)
MCV: 96.7 fL (ref 80.0–100.0)
Platelets: 329 10*3/uL (ref 150–400)
RBC: 3.89 MIL/uL (ref 3.87–5.11)
RDW: 12.5 % (ref 11.5–15.5)
WBC: 6.6 10*3/uL (ref 4.0–10.5)
nRBC: 0 % (ref 0.0–0.2)

## 2023-02-01 LAB — BASIC METABOLIC PANEL
Anion gap: 11 (ref 5–15)
BUN: 10 mg/dL (ref 8–23)
CO2: 22 mmol/L (ref 22–32)
Calcium: 9.4 mg/dL (ref 8.9–10.3)
Chloride: 104 mmol/L (ref 98–111)
Creatinine, Ser: 0.85 mg/dL (ref 0.44–1.00)
GFR, Estimated: >60 mL/min (ref >60)
Glucose, Bld: 116 mg/dL — ABNORMAL HIGH (ref 70–99)
Potassium: 3.7 mmol/L (ref 3.5–5.1)
Sodium: 137 mmol/L (ref 135–145)

## 2023-02-01 LAB — TROPONIN I (HIGH SENSITIVITY): Troponin I (High Sensitivity): 3 ng/L (ref <18)

## 2023-02-01 LAB — CBG MONITORING, ED: Glucose-Capillary: 118 mg/dL — ABNORMAL HIGH (ref 70–99)

## 2023-02-01 MED ORDER — ACETAMINOPHEN 325 MG PO TABS
650.0000 mg | ORAL_TABLET | Freq: Once | ORAL | Status: AC
  Administered 2023-02-01: 650 mg via ORAL
  Filled 2023-02-01: qty 2

## 2023-02-01 NOTE — ED Triage Notes (Signed)
Pt states she "fell out" last night while going to the restroom. Pt unsure if she hit her head. Pt c/o dizziness and head "tightness"

## 2023-02-01 NOTE — ED Notes (Signed)
Patient transported to CT 

## 2023-02-01 NOTE — ED Provider Triage Note (Signed)
Emergency Medicine Provider Triage Evaluation Note  Jacqueline Ball , a 82 y.o. female  was evaluated in triage.  Pt complains of loss of consciousness last night and again this morning. Both time when trying to get out of bed to use the bathroom. Felt lightheaded before passing out, lost consciousness both times while in the bed. No trauma sustained. Does feel she has been eating or drinking well, feels dehydrated.   Review of Systems  Positive: Syncope, lightheadedness Negative: CP, SOB, palpitations, headache  Physical Exam  BP 109/66 (BP Location: Right Arm)   Pulse 67   Temp 98.6 F (37 C)   Resp 18   Ht 5' 4"$  (1.626 m)   Wt 59 kg   SpO2 97%   BMI 22.31 kg/m  Gen:   Awake, no distress   Resp:  Normal effort  MSK:   Moves extremities without difficulty  Other:  No slurred speech, no facial droop  Medical Decision Making  Medically screening exam initiated at 6:34 PM.  Appropriate orders placed.  Jacqueline Ball was informed that the remainder of the evaluation will be completed by another provider, this initial triage assessment does not replace that evaluation, and the importance of remaining in the ED until their evaluation is complete.  Workup initiated   Jacqueline Plummer, PA-C 02/01/23 1843
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
## 2023-03-19 DIAGNOSIS — E1142 Type 2 diabetes mellitus with diabetic polyneuropathy: Secondary | ICD-10-CM | POA: Diagnosis not present

## 2023-03-19 DIAGNOSIS — Z794 Long term (current) use of insulin: Secondary | ICD-10-CM | POA: Diagnosis not present

## 2023-04-10 IMAGING — CT CT CERVICAL SPINE W/O CM
3 of 4 series · 12 of 33 positions shown, 14 images · non-contrast
Comparison: MRI cervical spine 12/07/2016. cervical spine CT
01/20/2019.

CLINICAL DATA: Cervical radiculopathy.

EXAM:
CT CERVICAL SPINE WITHOUT CONTRAST
TECHNIQUE: Multidetector CT imaging of the cervical spine was performed without
intravenous contrast. Multiplanar CT image reconstructions were also
generated.

[Series 4: c_spine 2.0 st · axial · 0.22mm/px · z∈[-300,-188]mm · 4 of 86 slices shown, 5 images]
[im 15/86  soft-tissue]
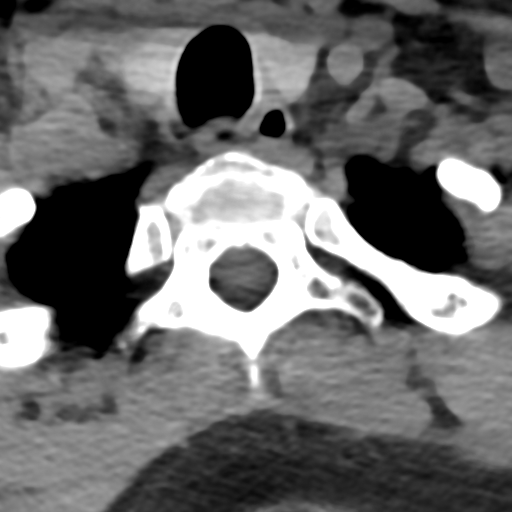
[im 15/86  bone]
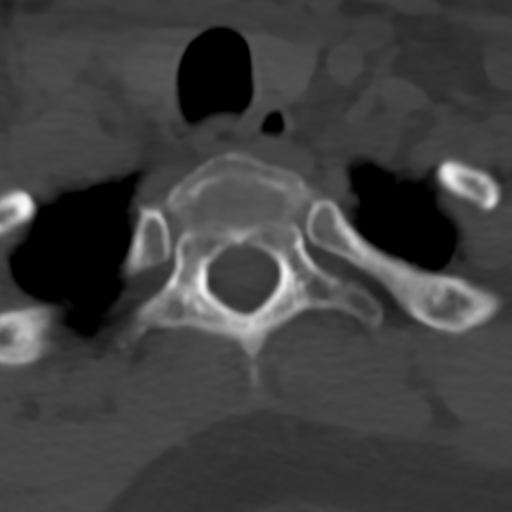
[im 29/86  bone]
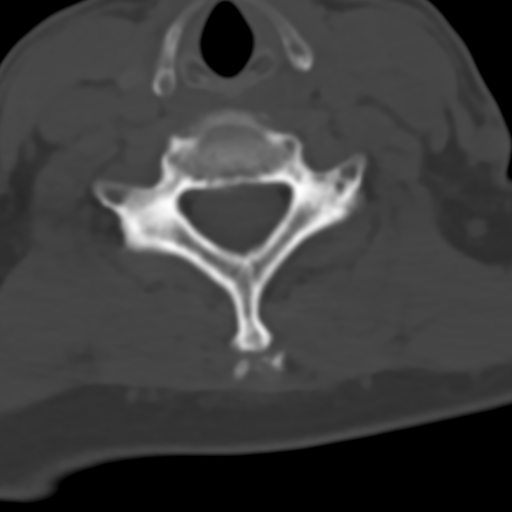
[im 57/86  bone]
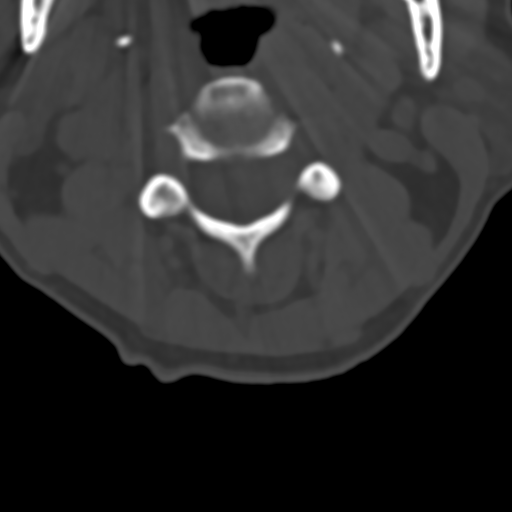
[im 71/86  bone]
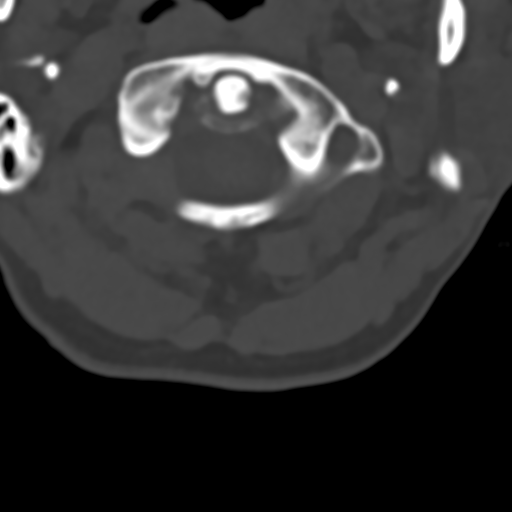

[Series 10: c_spine 2.0 sag bone · sagittal · 0.22mm/px · 5 of 61 slices shown, 6 images]
[im 21/61  bone]
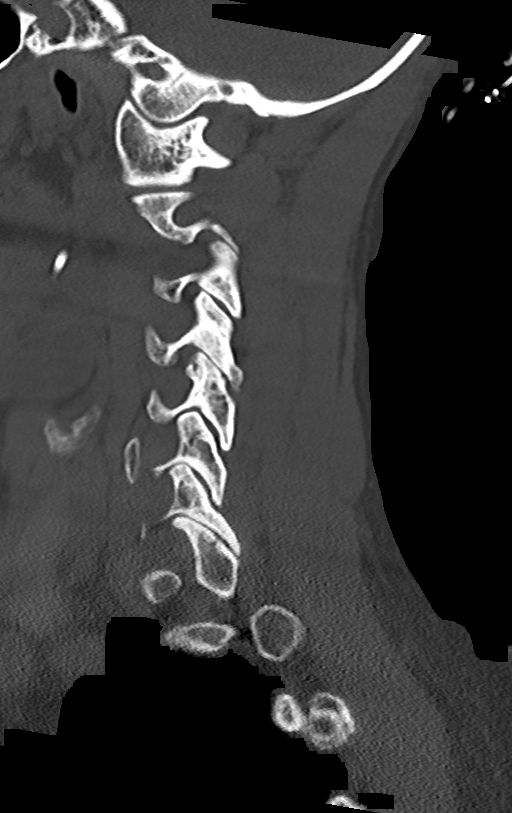
[im 26/61  bone]
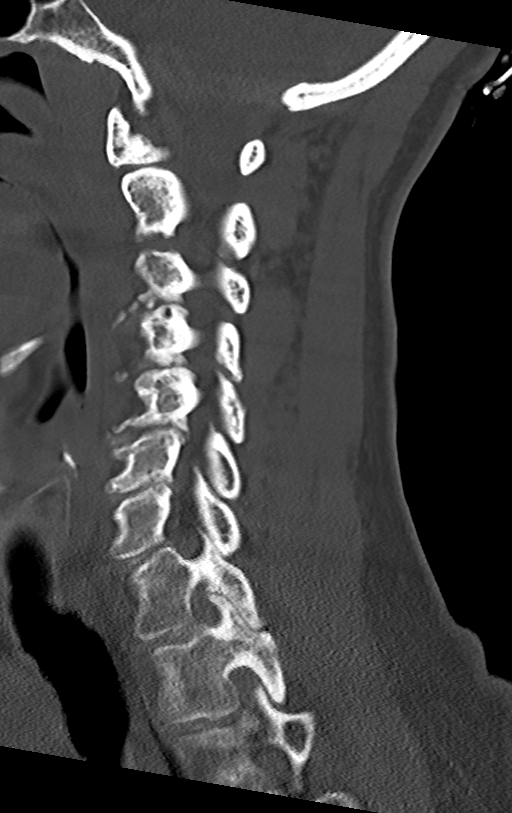
[im 31/61  soft-tissue]
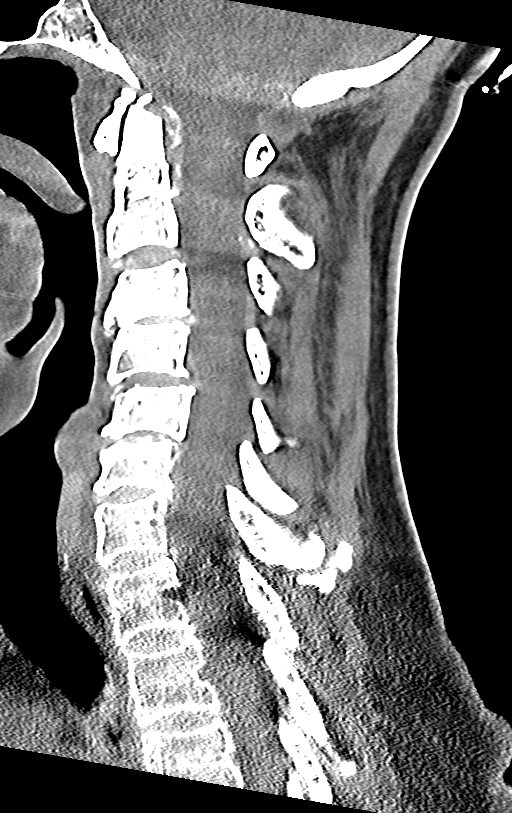
[im 31/61  bone]
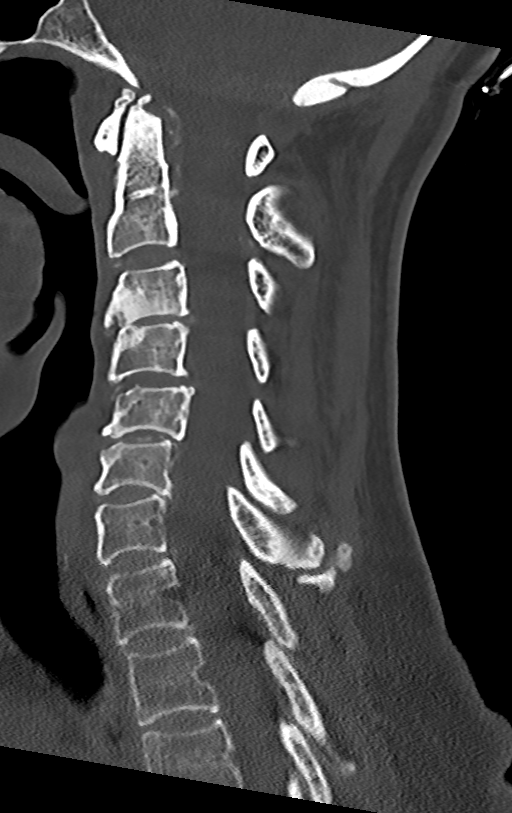
[im 36/61  bone]
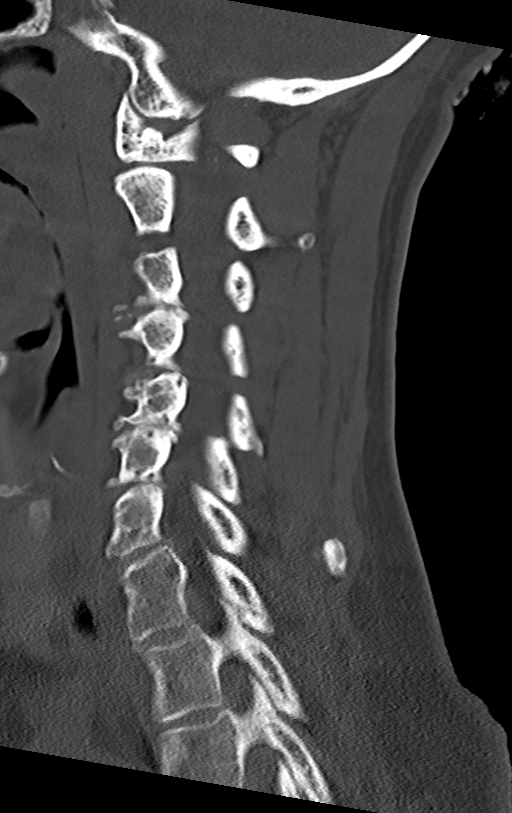
[im 41/61  bone]
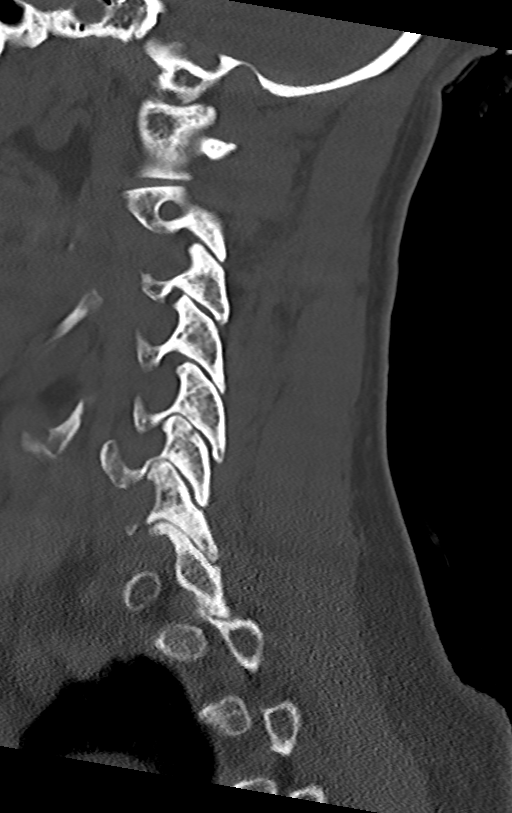

[Series 11: c_spine 2.0 cor bone · coronal · 0.25mm/px · 3 of 61 slices shown]
[im 13/61  bone]
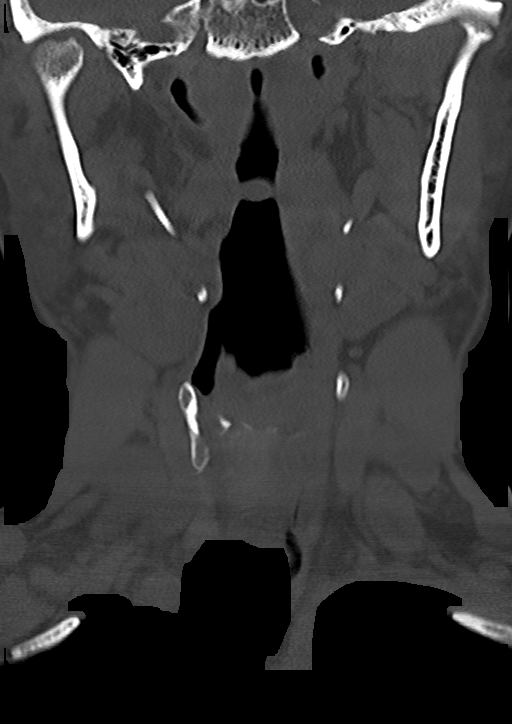
[im 25/61  bone]
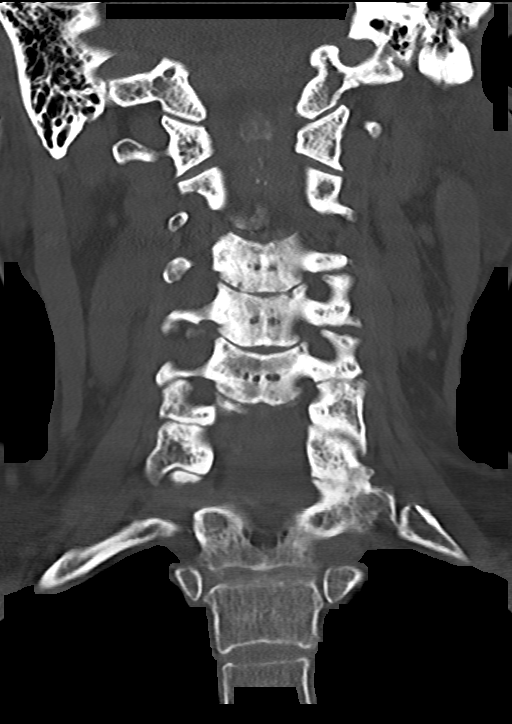
[im 37/61  bone]
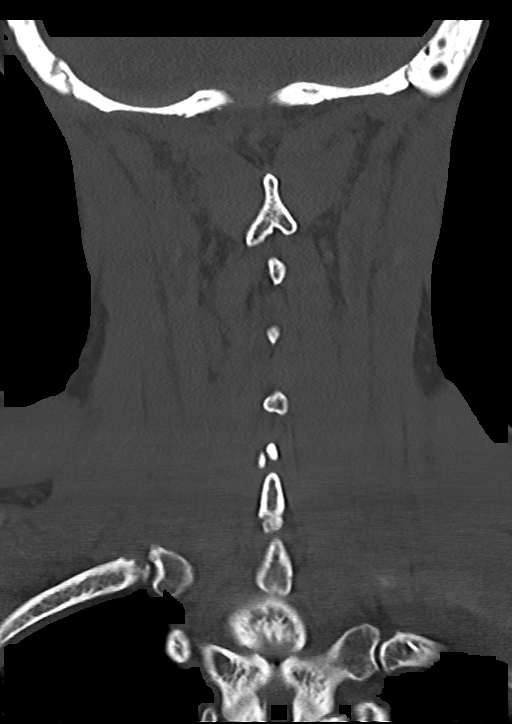

[12 of 33 positions shown; findings below may reference images not displayed]

FINDINGS: Alignment: There is stable 2 mm of anterolisthesis at C4-C5 and
stable 2 mm of retrolisthesis at C5-C6. This is likely degenerative.
Alignment is otherwise anatomic.

Skull base and vertebrae: No acute fracture. No focal osseous
lesion.

Soft tissues and spinal canal: No prevertebral fluid or swelling. No
visible canal hematoma.

Disc levels: Disc space narrowing and endplate osteophyte formation
throughout the cervical spine has mildly progressed at C3-C4. There
is no significant central canal stenosis at any level. Mild neural
foraminal stenosis on the right at C6-C7 and on the left at C5-C6
appear stable.

Upper chest: Negative.

Other: None.
IMPRESSION: 1.  No acute fracture or dislocation.

2. Degenerative changes throughout the cervical spine have minimally
progressed at C3-C4. No severe central canal or neural foraminal
stenosis identified.

## 2023-04-18 DIAGNOSIS — Z794 Long term (current) use of insulin: Secondary | ICD-10-CM | POA: Diagnosis not present

## 2023-04-18 DIAGNOSIS — E1142 Type 2 diabetes mellitus with diabetic polyneuropathy: Secondary | ICD-10-CM | POA: Diagnosis not present

## 2023-04-22 ENCOUNTER — Encounter: Payer: Self-pay | Admitting: Podiatry

## 2023-04-22 ENCOUNTER — Ambulatory Visit (INDEPENDENT_AMBULATORY_CARE_PROVIDER_SITE_OTHER): Payer: Medicare HMO | Admitting: Podiatry

## 2023-04-22 DIAGNOSIS — E119 Type 2 diabetes mellitus without complications: Secondary | ICD-10-CM

## 2023-04-22 DIAGNOSIS — B351 Tinea unguium: Secondary | ICD-10-CM | POA: Diagnosis not present

## 2023-04-22 DIAGNOSIS — T148XXA Other injury of unspecified body region, initial encounter: Secondary | ICD-10-CM

## 2023-04-22 DIAGNOSIS — M79609 Pain in unspecified limb: Secondary | ICD-10-CM

## 2023-04-22 NOTE — Progress Notes (Signed)
  Subjective:  Patient ID: Jacqueline Ball, female    DOB: 1941/03/18,   MRN: [id number]  Chief Complaint  Patient presents with   Nail Problem    Routine foot care     82 y.o. female presents for.concern of thickened elongated and painful nails that are difficult to trim. Requesting to have them trimmed today. Relates burning and tingling in their feet. Patient is diabetic and last A1c was  Lab Results  Component Value Date   HGBA1C 5.7 (H) 11/22/2014   .   PCP:  Wilmer Floor, NP     PCP:  Wilmer Floor, NP    . Denies any other pedal complaints. Denies n/v/f/c.   Past Medical History:  Diagnosis Date   Anxiety    Atrial fibrillation (HCC)    Diabetes mellitus    Hyperlipidemia    Hypertension    Memory loss    Mood disorder (HCC)    Physical exam, annual 10/22/06   Renal cyst     Objective:  Physical Exam: Vascular: DP/PT pulses 2/4 bilateral. CFT <3 seconds. Absent hair growth on digits. Edema noted to bilateral lower extremities. Xerosis noted bilaterally.  Skin. No lacerations or abrasions bilateral feet. Nails 1-5 bilateral  are thickened discolored and elongated with subungual debris. Some abrasions noted to bilateral heels.  Musculoskeletal: MMT 5/5 bilateral lower extremities in DF, PF, Inversion and Eversion. Deceased ROM in DF of ankle joint.  Neurological: Sensation intact to light touch. Protective sensation diminished bilateral.     Assessment:   1. Pain due to onychomycosis of nail   2. Type 2 diabetes mellitus without complication, without long-term current use of insulin (HCC)   3. Abrasion of skin       Plan:  Patient was evaluated and treated and all questions answered. -Discussed and educated patient on diabetic foot care, especially with  regards to the vascular, neurological and musculoskeletal systems.  -Stressed the importance of good glycemic control and the detriment of not  controlling glucose levels in relation to the  foot. -Discussed supportive shoes at all times and checking feet regularly.  -Mechanically debrided all nails 1-5 bilateral using sterile nail nipper and filed with dremel without incident  -Heel abrasions covered with neosporin and bandaid.  -Answered all patient questions -Patient to return  in 3 months for at risk foot care -Patient advised to call the office if any problems or questions arise in the meantime.    Louann Sjogren, DPM

## 2023-04-29 DIAGNOSIS — E1142 Type 2 diabetes mellitus with diabetic polyneuropathy: Secondary | ICD-10-CM | POA: Diagnosis not present

## 2023-04-29 DIAGNOSIS — I700 Atherosclerosis of aorta: Secondary | ICD-10-CM | POA: Diagnosis not present

## 2023-04-29 DIAGNOSIS — Z79899 Other long term (current) drug therapy: Secondary | ICD-10-CM | POA: Diagnosis not present

## 2023-04-29 DIAGNOSIS — K219 Gastro-esophageal reflux disease without esophagitis: Secondary | ICD-10-CM | POA: Diagnosis not present

## 2023-04-29 DIAGNOSIS — Z0001 Encounter for general adult medical examination with abnormal findings: Secondary | ICD-10-CM | POA: Diagnosis not present

## 2023-04-29 DIAGNOSIS — D6869 Other thrombophilia: Secondary | ICD-10-CM | POA: Diagnosis not present

## 2023-04-29 DIAGNOSIS — F0280 Dementia in other diseases classified elsewhere without behavioral disturbance: Secondary | ICD-10-CM | POA: Diagnosis not present

## 2023-04-29 DIAGNOSIS — R0602 Shortness of breath: Secondary | ICD-10-CM | POA: Diagnosis not present

## 2023-04-29 DIAGNOSIS — I10 Essential (primary) hypertension: Secondary | ICD-10-CM | POA: Diagnosis not present

## 2023-04-29 DIAGNOSIS — I482 Chronic atrial fibrillation, unspecified: Secondary | ICD-10-CM | POA: Diagnosis not present

## 2023-04-29 DIAGNOSIS — G301 Alzheimer's disease with late onset: Secondary | ICD-10-CM | POA: Diagnosis not present

## 2023-05-03 DIAGNOSIS — I10 Essential (primary) hypertension: Secondary | ICD-10-CM | POA: Diagnosis not present

## 2023-05-03 DIAGNOSIS — R202 Paresthesia of skin: Secondary | ICD-10-CM | POA: Diagnosis not present

## 2023-05-03 DIAGNOSIS — M19012 Primary osteoarthritis, left shoulder: Secondary | ICD-10-CM | POA: Diagnosis not present

## 2023-05-18 ENCOUNTER — Emergency Department (HOSPITAL_COMMUNITY)
Admission: EM | Admit: 2023-05-18 | Discharge: 2023-05-19 | Disposition: A | Discharge status: Home / Self Care | Payer: Medicare HMO | Attending: Emergency Medicine | Admitting: Emergency Medicine

## 2023-05-18 ENCOUNTER — Other Ambulatory Visit: Payer: Self-pay

## 2023-05-18 ENCOUNTER — Emergency Department (HOSPITAL_COMMUNITY): Payer: Medicare HMO

## 2023-05-18 ENCOUNTER — Encounter (HOSPITAL_COMMUNITY): Payer: Self-pay | Admitting: Emergency Medicine

## 2023-05-18 DIAGNOSIS — M79642 Pain in left hand: Secondary | ICD-10-CM | POA: Diagnosis not present

## 2023-05-18 DIAGNOSIS — I10 Essential (primary) hypertension: Secondary | ICD-10-CM | POA: Diagnosis not present

## 2023-05-18 DIAGNOSIS — Z7984 Long term (current) use of oral hypoglycemic drugs: Secondary | ICD-10-CM | POA: Insufficient documentation

## 2023-05-18 DIAGNOSIS — M1990 Unspecified osteoarthritis, unspecified site: Secondary | ICD-10-CM | POA: Insufficient documentation

## 2023-05-18 DIAGNOSIS — M19042 Primary osteoarthritis, left hand: Secondary | ICD-10-CM | POA: Diagnosis not present

## 2023-05-18 DIAGNOSIS — F0390 Unspecified dementia without behavioral disturbance: Secondary | ICD-10-CM | POA: Diagnosis not present

## 2023-05-18 DIAGNOSIS — G309 Alzheimer's disease, unspecified: Secondary | ICD-10-CM | POA: Insufficient documentation

## 2023-05-18 DIAGNOSIS — M79641 Pain in right hand: Secondary | ICD-10-CM | POA: Insufficient documentation

## 2023-05-18 DIAGNOSIS — Z7901 Long term (current) use of anticoagulants: Secondary | ICD-10-CM | POA: Insufficient documentation

## 2023-05-18 DIAGNOSIS — Z79899 Other long term (current) drug therapy: Secondary | ICD-10-CM | POA: Insufficient documentation

## 2023-05-18 DIAGNOSIS — I4891 Unspecified atrial fibrillation: Secondary | ICD-10-CM | POA: Insufficient documentation

## 2023-05-18 DIAGNOSIS — E119 Type 2 diabetes mellitus without complications: Secondary | ICD-10-CM | POA: Insufficient documentation

## 2023-05-18 DIAGNOSIS — Z794 Long term (current) use of insulin: Secondary | ICD-10-CM | POA: Insufficient documentation

## 2023-05-18 DIAGNOSIS — M19041 Primary osteoarthritis, right hand: Secondary | ICD-10-CM | POA: Diagnosis not present

## 2023-05-18 NOTE — ED Triage Notes (Signed)
Pt in with bilateral hand and finger pain and parasthesia for over a month. Pt states she just has no reason why they always hurt, denies any injury. Does have diabetes, reports sugars well-controlled at home

## 2023-05-19 MED ORDER — DICLOFENAC SODIUM 1 % EX GEL: CUTANEOUS | 1 refills | Status: DC

## 2023-05-19 NOTE — ED Notes (Signed)
Called daughter and spouse for ride home. No answer.

## 2023-05-19 NOTE — ED Notes (Signed)
Son to pick up pt

## 2023-05-19 NOTE — ED Notes (Signed)
Discharge instructions provided by edp were discussed with pt and pt's son. Pt wheelchair by staff to car.

## 2023-05-19 NOTE — ED Provider Notes (Signed)
[place] EMERGENCY DEPARTMENT AT [facility] Provider Note  CSN: [id number] Arrival date & time: 05/18/23 2158  Chief Complaint(s) Hand Pain  HPI Jacqueline Ball is a 82 y.o. female with a past medical history listed below who presents to the emergency department with bilateral hand pain.  This been ongoing for "a while."  No trauma.  No swelling.  The history is provided by the patient.    Past Medical History Past Medical History:  Diagnosis Date   Anxiety    Atrial fibrillation (HCC)    Diabetes mellitus    Hyperlipidemia    Hypertension    Memory loss    Mood disorder [facility])    Physical exam, annual 10/22/06   Renal cyst    Patient Active Problem List   Diagnosis Date Noted   Acute midline low back pain without sciatica 11/29/2019   GERD (gastroesophageal reflux disease) 05/12/2019   Arthritis 05/12/2019   Dementia (HCC) 05/12/2019   Hyperlipidemia 05/12/2019   Overactive bladder 05/12/2019   Chronic anticoagulation 03/18/2018   Late onset Alzheimer's disease without behavioral disturbance (HCC) 01/21/2017   Chronic atrial fibrillation (HCC) 11/02/2015   Essential hypertension 11/02/2015   Type 2 diabetes mellitus without complication, without long-term current use of insulin (HCC) 11/02/2015   Cognitive impairment 10/31/2015   Chest pain 11/22/2014   Muscle cramps 04/17/2012   Bilateral shoulder pain 03/09/2012   Essential hypertension 03/28/2011   Diabetes mellitus type 2, noninsulin dependent (HCC) 03/28/2011   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 Authorizing Provider  ACCU-CHEK GUIDE test strip USE AS DIRECTED UP TO THREE TIMES DAILY 01/16/21   [provider]  acetaminophen (TYLENOL) 500 MG tablet Take 500 mg by mouth every 6 (six) hours as needed for pain.    [provider]  apixaban (ELIQUIS) 5 MG TABS tablet Take 1 tablet (5 mg  total) by mouth 2 (two) times daily. 11/24/14   Rinaldo Cloud, MD  atenolol (TENORMIN) 50 MG tablet Take 50 mg by mouth daily. Patient not taking: Reported on 07/06/2022 07/29/19   [provider]  atorvastatin (LIPITOR) 20 MG tablet Take 20 mg by mouth every evening.    [provider]  azelastine (ASTELIN) 0.1 % nasal spray Place 1 spray into both nostrils as needed for rhinitis or allergies.  05/24/18   [provider]  Blood Glucose Monitoring Suppl (ACCU-CHEK GUIDE) w/Device KIT USE AS DIRECTED UP TO THREE TIMES DAILY 01/16/21   [provider]  cyclobenzaprine (FLEXERIL) 5 MG tablet Take 1-2 tablets (5-10 mg total) by mouth at bedtime. Patient not taking: Reported on 07/06/2022 06/06/20   Wieters, Fran Lowes C, PA-C  diclofenac Sodium (VOLTAREN) 1 % GEL Apply one application (2g) up to four times daily as needed for pain. 05/19/23   Nira Conn, MD  donepezil (ARICEPT) 10 MG tablet Take 1 tablet (10 mg total) by mouth at bedtime. 06/06/17   Marvel Plan, MD  esomeprazole (NEXIUM) 40 MG capsule Take 40 mg by mouth daily at 12 noon.    [provider]  fluconazole (DIFLUCAN) 150 MG tablet fluconazole 150 mg tablet Patient not taking: Reported on 07/06/2022    [provider]  fluticasone (FLONASE) 50 MCG/ACT nasal spray Place 1 spray into both nostrils daily. Patient taking differently: Place 1 spray into both nostrils daily as needed for allergies. 09/15/18   Janace Aris, NP  gabapentin (NEURONTIN)  100 MG capsule Take 100 mg by mouth at bedtime.    [provider]  glimepiride (AMARYL) 4 MG tablet Take 1 tablet (4 mg total) by mouth daily with breakfast. 06/29/22   Charlynne Pander, MD  insulin glargine (LANTUS) 100 UNIT/ML injection Inject 1 Units into the skin every evening.    [provider]  losartan (COZAAR) 25 MG tablet Take 25 mg by mouth daily. Patient not taking: Reported on 07/06/2022 05/03/20   [provider]  metFORMIN (GLUCOPHAGE) 500 MG tablet Take 1 tablet (500 mg total) by mouth 2 (two) times daily with a meal. 06/29/22   Charlynne Pander, MD  metroNIDAZOLE (FLAGYL) 500 MG tablet Take 1 tablet (500 mg total) by mouth 2 (two) times daily. Patient not taking: Reported on 07/06/2022 03/31/22   Milagros Loll, MD  mupirocin ointment (BACTROBAN) 2 % Apply 1 application. topically 2 (two) times daily. To affected area till better Patient not taking: Reported on 07/06/2022 05/09/22   Zenia Resides, MD  nystatin cream (MYCOSTATIN) Apply to affected area 2 times daily Patient not taking: Reported on 07/06/2022 04/02/20   Dahlia Byes A, NP  pantoprazole (PROTONIX) 20 MG tablet Take 1 tablet (20 mg total) by mouth daily. Patient not taking: Reported on 07/06/2022 02/24/20   Coralyn Mark, NP  traMADol (ULTRAM) 50 MG tablet Take 1 tablet (50 mg total) by mouth 2 (two) times daily as needed for severe pain. for pain Patient not taking: Reported on 07/06/2022 11/23/21   Antony Madura, PA-C                                                                                                                                    Allergies Metoprolol  Review of Systems Review of Systems As noted in HPI  Physical Exam Vital Signs  I have reviewed the triage vital signs BP (!) 146/67 (BP Location: Right Arm)   Pulse 63   Temp 97.9 F (36.6 C)   Resp 18   Wt 63.5 kg   SpO2 99%   BMI 24.03 kg/m   Physical Exam Vitals reviewed.  Constitutional:      General: She is not in acute distress.    Appearance: She is well-developed. She is not diaphoretic.  HENT:     Head: Normocephalic and atraumatic.     Right Ear: External ear normal.     Left Ear: External ear normal.     Nose: Nose normal.  Eyes:     General: No scleral icterus.    Conjunctiva/sclera: Conjunctivae normal.  Neck:     Trachea: Phonation normal.  Cardiovascular:     Rate and Rhythm: Normal rate and regular rhythm.   Pulmonary:     Effort: Pulmonary effort is normal. No respiratory distress.     Breath sounds: No stridor.  Abdominal:     General: There is no distension.  Musculoskeletal:  General: Normal range of motion.     Right hand: Tenderness (mild) present. No swelling, deformity or lacerations. Normal range of motion. Normal strength. Normal sensation. Normal pulse.     Left hand: Tenderness (mild) present. No swelling, deformity or lacerations. Normal range of motion. Normal strength. Normal sensation. Normal pulse.     Cervical back: Normal range of motion.  Neurological:     Mental Status: She is alert and oriented to person, place, and time.  Psychiatric:        Behavior: Behavior normal.     ED Results and Treatments Labs (all labs ordered are listed, but only abnormal results are displayed) Labs Reviewed - No data to display                                                                                                                       EKG  EKG Interpretation  Date/Time:    Ventricular Rate:    PR Interval:    QRS Duration:   QT Interval:    QTC Calculation:   R Axis:     Text Interpretation:         Radiology DG Hand Complete Right  Result Date: 05/18/2023 CLINICAL DATA:  Bilateral hand pain. No known injury. EXAM: RIGHT HAND - COMPLETE 3+ VIEW COMPARISON:  Radiograph 04/01/2020 FINDINGS: There is no evidence of fracture or dislocation. Scattered osteoarthritis primarily throughout the digits. Degenerative change at the thumb carpal metacarpal joint has mildly progressed. No erosions or periostitis. No focal soft tissue abnormalities. IMPRESSION: 1. Scattered osteoarthritis throughout the digits. 2. Mild progression of degenerative change at the thumb carpal metacarpal joint. 3. No evidence of inflammatory arthropathy.  No acute findings. Electronically Signed   By: Narda Rutherford M.D.   On: 05/18/2023 23:09   DG Hand Complete Left  Result Date:  05/18/2023 CLINICAL DATA:  Bilateral hand pain. No known injury. EXAM: LEFT HAND - COMPLETE 3+ VIEW COMPARISON:  Left hand radiograph 07/16/2019 FINDINGS: There is no evidence of fracture or dislocation. Scattered osteoarthritis primarily throughout the digits. Degenerative changes at the thumb carpal metacarpal joint has slightly progressed from 2020 exam. No focal soft tissue abnormality soft tissues are unremarkable. IMPRESSION: 1. Scattered osteoarthritis primarily throughout the digits. 2. Mild progression of degenerative change at the thumb carpometacarpal joint. 3. Chondrocalcinosis of the triangular fibrocartilage. 4. No evidence of inflammatory arthropathy or acute findings. Electronically Signed   By: Narda Rutherford M.D.   On: 05/18/2023 23:07    Medications Ordered in ED Medications - No data to display Procedures Procedures  (including critical care time) Medical Decision Making / ED Course   Medical Decision Making Amount and/or Complexity of Data Reviewed Radiology: ordered.    Bilateral hand pain.  Pulses intact unlikely vascular process.  No evidence of infection.  Plain films notable for degenerative joint disease.  No fracture or dislocation.  Prescribed Voltaren.  Recommended PCP follow-up.    Final Clinical Impression(s) / ED Diagnoses Final diagnoses:  Bilateral hand pain  Arthritis   The patient appears reasonably screened and/or stabilized for discharge and I doubt any other medical condition or other [facility] requiring further screening, evaluation, or treatment in the ED at this time. I have discussed the findings, Dx and Tx plan with the patient/family who expressed understanding and agree(s) with the plan. Discharge instructions discussed at length. The patient/family was given strict return precautions who verbalized understanding of the instructions. No further questions at time of discharge.  Disposition: Discharge  Condition: Good  ED Discharge Orders           Ordered    diclofenac Sodium (VOLTAREN) 1 % GEL        05/19/23 0243              Follow Up: Wilmer Floor, NP [address] [place] [address] [phone]  Call  to schedule an appointment for close follow up    This chart was dictated using voice recognition software.  Despite best efforts to proofread,  errors can occur which can change the documentation meaning.    Nira Conn, MD 05/19/23 [phone]

## 2023-05-20 DIAGNOSIS — Z794 Long term (current) use of insulin: Secondary | ICD-10-CM | POA: Diagnosis not present

## 2023-05-20 DIAGNOSIS — E1142 Type 2 diabetes mellitus with diabetic polyneuropathy: Secondary | ICD-10-CM | POA: Diagnosis not present

## 2023-05-28 ENCOUNTER — Emergency Department (HOSPITAL_COMMUNITY): Payer: Medicare HMO

## 2023-05-28 ENCOUNTER — Other Ambulatory Visit: Payer: Self-pay

## 2023-05-28 ENCOUNTER — Observation Stay (HOSPITAL_COMMUNITY)
Admission: EM | Admit: 2023-05-28 | Discharge: 2023-05-29 | Disposition: A | Discharge status: Home Health | Payer: Medicare HMO | Attending: Emergency Medicine | Admitting: Emergency Medicine

## 2023-05-28 ENCOUNTER — Encounter (HOSPITAL_COMMUNITY): Payer: Self-pay

## 2023-05-28 DIAGNOSIS — Z79899 Other long term (current) drug therapy: Secondary | ICD-10-CM | POA: Diagnosis not present

## 2023-05-28 DIAGNOSIS — Z7984 Long term (current) use of oral hypoglycemic drugs: Secondary | ICD-10-CM | POA: Diagnosis not present

## 2023-05-28 DIAGNOSIS — R0602 Shortness of breath: Principal | ICD-10-CM

## 2023-05-28 DIAGNOSIS — K219 Gastro-esophageal reflux disease without esophagitis: Secondary | ICD-10-CM

## 2023-05-28 DIAGNOSIS — I10 Essential (primary) hypertension: Secondary | ICD-10-CM

## 2023-05-28 DIAGNOSIS — B9689 Other specified bacterial agents as the cause of diseases classified elsewhere: Secondary | ICD-10-CM | POA: Insufficient documentation

## 2023-05-28 DIAGNOSIS — E440 Moderate protein-calorie malnutrition: Secondary | ICD-10-CM | POA: Insufficient documentation

## 2023-05-28 DIAGNOSIS — I482 Chronic atrial fibrillation, unspecified: Secondary | ICD-10-CM | POA: Diagnosis not present

## 2023-05-28 DIAGNOSIS — E162 Hypoglycemia, unspecified: Secondary | ICD-10-CM | POA: Diagnosis not present

## 2023-05-28 DIAGNOSIS — E119 Type 2 diabetes mellitus without complications: Secondary | ICD-10-CM

## 2023-05-28 DIAGNOSIS — R4189 Other symptoms and signs involving cognitive functions and awareness: Secondary | ICD-10-CM | POA: Diagnosis not present

## 2023-05-28 DIAGNOSIS — E876 Hypokalemia: Secondary | ICD-10-CM | POA: Diagnosis present

## 2023-05-28 DIAGNOSIS — Z7901 Long term (current) use of anticoagulants: Secondary | ICD-10-CM | POA: Diagnosis not present

## 2023-05-28 DIAGNOSIS — R419 Unspecified symptoms and signs involving cognitive functions and awareness: Secondary | ICD-10-CM | POA: Diagnosis not present

## 2023-05-28 DIAGNOSIS — N390 Urinary tract infection, site not specified: Secondary | ICD-10-CM | POA: Diagnosis present

## 2023-05-28 DIAGNOSIS — Z794 Long term (current) use of insulin: Secondary | ICD-10-CM | POA: Insufficient documentation

## 2023-05-28 DIAGNOSIS — R2681 Unsteadiness on feet: Secondary | ICD-10-CM | POA: Insufficient documentation

## 2023-05-28 DIAGNOSIS — R41841 Cognitive communication deficit: Secondary | ICD-10-CM | POA: Diagnosis not present

## 2023-05-28 DIAGNOSIS — F0390 Unspecified dementia without behavioral disturbance: Secondary | ICD-10-CM | POA: Insufficient documentation

## 2023-05-28 DIAGNOSIS — E11649 Type 2 diabetes mellitus with hypoglycemia without coma: Secondary | ICD-10-CM | POA: Insufficient documentation

## 2023-05-28 LAB — CBC
HCT: 37.4 % (ref 36.0–46.0)
Hemoglobin: 12.2 g/dL (ref 12.0–15.0)
MCH: 32.2 pg (ref 26.0–34.0)
MCHC: 32.6 g/dL (ref 30.0–36.0)
MCV: 98.7 fL (ref 80.0–100.0)
Platelets: 329 10*3/uL (ref 150–400)
RBC: 3.79 MIL/uL — ABNORMAL LOW (ref 3.87–5.11)
RDW: 12.7 % (ref 11.5–15.5)
WBC: 8.2 10*3/uL (ref 4.0–10.5)
nRBC: 0 % (ref 0.0–0.2)

## 2023-05-28 LAB — TROPONIN I (HIGH SENSITIVITY)
Troponin I (High Sensitivity): 3 ng/L (ref <18)
Troponin I (High Sensitivity): 3 ng/L (ref <18)

## 2023-05-28 LAB — CBG MONITORING, ED
Glucose-Capillary: 185 mg/dL — ABNORMAL HIGH (ref 70–99)
Glucose-Capillary: 190 mg/dL — ABNORMAL HIGH (ref 70–99)
Glucose-Capillary: 45 mg/dL — ABNORMAL LOW (ref 70–99)
Glucose-Capillary: 49 mg/dL — ABNORMAL LOW (ref 70–99)

## 2023-05-28 LAB — BASIC METABOLIC PANEL
Anion gap: 9 (ref 5–15)
BUN: 15 mg/dL (ref 8–23)
CO2: 25 mmol/L (ref 22–32)
Calcium: 9.8 mg/dL (ref 8.9–10.3)
Chloride: 104 mmol/L (ref 98–111)
Creatinine, Ser: 0.81 mg/dL (ref 0.44–1.00)
GFR, Estimated: >60 mL/min (ref >60)
Glucose, Bld: 61 mg/dL — ABNORMAL LOW (ref 70–99)
Potassium: 4.1 mmol/L (ref 3.5–5.1)
Sodium: 138 mmol/L (ref 135–145)

## 2023-05-28 MED ORDER — DEXTROSE 50 % IV SOLN: 1.0000 | Freq: Once | INTRAVENOUS | Status: AC

## 2023-05-28 MED ORDER — DEXTROSE 50 % IV SOLN
INTRAVENOUS | Status: AC
  Administered 2023-05-28: 50 mL via INTRAVENOUS
  Filled 2023-05-28: qty 50

## 2023-05-28 MED ORDER — IOHEXOL 350 MG/ML SOLN
80.0000 mL | Freq: Once | INTRAVENOUS | Status: AC | PRN
  Administered 2023-05-28: 80 mL via INTRAVENOUS

## 2023-05-28 MED ORDER — DEXTROSE 10 % IV SOLN: INTRAVENOUS | Status: DC

## 2023-05-28 NOTE — H&P (Addendum)
Jacqueline Ball ZOX:[id number] DOB: [birth year] DOA: 05/28/2023     PCP: Wilmer Floor, NP   Outpatient Specialists: NONE    Patient arrived to ER on 05/28/23 at 1502 Referred by Attending Virgina Norfolk, DO   Patient coming from:    home Lives  With family    Chief Complaint:   Chief Complaint  Patient presents with   Shortness of Breath   Dizziness    HPI: Jacqueline Ball is a 82 y.o. female with medical history significant of DM 2, anxiety, A-fib on Eliquis hypertension, dementia    Presented with   shortness of breath  Patient reports she has been having more shortness of breath and feels lightheaded this was a transient episode and now feeling better On arrival she was noted to be hypoglycemic with blood sugars in 49 Patient has known history of diabetes Reports that she had a sudden onset of shortness of breath around 9:30 at night yesterday associated with left-sided chest tingling lasted for about 15 minutes and then improved when she woke up she had repeat episode again lasted about 15 minutes Does not smoke no fevers no chills Had in the past panic attacks thinks maybe it could be that Now she feels back to baseline    She has been very busy and has been doing a lot of cleanng  She is unsure if she took her insulin  Her daughter gives her the shots  Her memory has been a bit worse Feels more confused, reports chills  Denies significant ETOH intake   Does not smoke   Lab Results  Component Value Date   SARSCOV2NAA NEGATIVE 11/12/2021   SARSCOV2NAA NEGATIVE 01/12/2021        Regarding pertinent Chronic problems:     Hyperlipidemia - on statins Lipitor (atorvastatin)  Lipid Panel     Component Value Date/Time   CHOL 113 11/23/2014 0533   TRIG 78 11/23/2014 0533   HDL 44 11/23/2014 0533   CHOLHDL 2.6 11/23/2014 0533   VLDL 16 11/23/2014 0533   LDLCALC 53 11/23/2014 0533     HTN on atenolol, losartan      DM 2 -  Lab Results  Component Value Date    HGBA1C 5.7 (H) 11/22/2014   on insulin       A. Fib -  - CHA2DS2 vas score   5    current  on anticoagulation with   Eliquis,          -  Rate control:  Currently controlled with atenolol      While in ER:     Lab Orders         Basic metabolic panel         CBC         POC CBG, ED         POC CBG, ED         POC CBG, ED         POC CBG, ED      CXR -  NON acute   CTA chest - ordered  Following Medications were ordered in ER: Medications  dextrose 10 % infusion (has no administration in time range)  dextrose 50 % solution 50 mL (50 mLs Intravenous Given 05/28/23 2129)     ED Triage Vitals  Enc Vitals Group     BP 05/28/23 1510 105/71     Pulse Rate 05/28/23 1510 64     Resp 05/28/23 1510 16  Temp 05/28/23 1510 98 F (36.7 C)     Temp Source 05/28/23 1510 Oral     SpO2 05/28/23 1510 96 %     Weight 05/28/23 1510 130 lb (59 kg)     Height 05/28/23 1510 5\' 4"  (1.626 m)     Head Circumference --      Peak Flow --      Pain Score 05/28/23 1517 0     Pain Loc --      Pain Edu? --      Excl. in GC? --   TMAX(24)@     _________________________________________ Significant initial  Findings: Abnormal Labs Reviewed  BASIC METABOLIC PANEL - Abnormal; Notable for the following components:      Result Value   Glucose, Bld 61 (*)    All other components within normal limits  CBC - Abnormal; Notable for the following components:   RBC 3.79 (*)    All other components within normal limits  CBG MONITORING, ED - Abnormal; Notable for the following components:   Glucose-Capillary 49 (*)    All other components within normal limits  CBG MONITORING, ED - Abnormal; Notable for the following components:   Glucose-Capillary 45 (*)    All other components within normal limits  CBG MONITORING, ED - Abnormal; Notable for the following components:   Glucose-Capillary 190 (*)    All other components within normal limits    _________________________ Troponin  ordered Cardiac  Panel (last 3 results) Recent Labs    05/28/23 1730  TROPONINIHS 3     ECG: Ordered Personally reviewed and interpreted by me showing: HR : 61 Rhythm Sinus rhythm Nonspecific T abnormalities, lateral leads QTC 448  BNP (last 3 results) No results for input(s): "BNP" in the last 8760 hours.   COVID-19 Labs  No results for input(s): "DDIMER", "FERRITIN", "LDH", "CRP" in the last 72 hours.  Lab Results  Component Value Date   SARSCOV2NAA NEGATIVE 11/12/2021   SARSCOV2NAA NEGATIVE 01/12/2021      The recent clinical data is shown below. Vitals:   05/28/23 1510 05/28/23 1911 05/28/23 2132 05/28/23 2230  BP: 105/71 102/66 (!) 141/69 (!) 104/59  Pulse: 64 71 85 83  Resp: 16 16 16 16   Temp: 98 F (36.7 C) 98.1 F (36.7 C) 97.7 F (36.5 C)   TempSrc: Oral Oral Oral   SpO2: 96% 98% 98% 98%  Weight: 59 kg     Height: 5\' 4"  (1.626 m)       WBC     Component Value Date/Time   WBC 8.2 05/28/2023 1730   LYMPHSABS 1.4 07/08/2022 1341   MONOABS 0.5 07/08/2022 1341   EOSABS 0.0 07/08/2022 1341   BASOSABS 0.0 07/08/2022 1341       UA   ordered     Results for orders placed or performed during the hospital encounter of 03/31/22  Wet prep, genital     Status: Abnormal   Collection Time: 03/31/22  5:39 PM  Result Value Ref Range Status   Yeast Wet Prep HPF POC NONE SEEN NONE SEEN Final   Trich, Wet Prep NONE SEEN NONE SEEN Final   Clue Cells Wet Prep HPF POC PRESENT (A) NONE SEEN Final   WBC, Wet Prep HPF POC >=10 (A) <10 Final   Sperm NONE SEEN  Final    Comment: Performed at [facility], [address], [place], [place] [address]    _________________________________________________ Recent Labs  Lab 05/28/23 1730  NA  138  K 4.1  CO2 25  GLUCOSE 61*  BUN 15  CREATININE 0.81  CALCIUM 9.8    Cr    stable,    Lab Results  Component Value Date   CREATININE 0.81 05/28/2023   CREATININE 0.85 02/01/2023   CREATININE 0.75 12/28/2022    No results for  input(s): "AST", "ALT", "ALKPHOS", "BILITOT", "PROT", "ALBUMIN" in the last 168 hours. Lab Results  Component Value Date   CALCIUM 9.8 05/28/2023        Plt: Lab Results  Component Value Date   PLT 329 05/28/2023    Recent Labs  Lab 05/28/23 1730  WBC 8.2  HGB 12.2  HCT 37.4  MCV 98.7  PLT 329    HG/HCT   stable,       Component Value Date/Time   HGB 12.2 05/28/2023 1730   HCT 37.4 05/28/2023 1730   MCV 98.7 05/28/2023 1730     _______________________________________________ Hospitalist was called for admission for   Shortness of breath  Hypoglycemia     The following Work up has been ordered so far:  Orders Placed This Encounter  Procedures   DG Chest 2 View   Basic metabolic panel   CBC   ED Cardiac monitoring   Consult to hospitalist   POC CBG, ED   POC CBG, ED   POC CBG, ED   POC CBG, ED   EKG 12-Lead   ED EKG   Insert peripheral IV     OTHER Significant initial  Findings:  labs showing:     DM  labs:  HbA1C: No results for input(s): "HGBA1C" in the last 8760 hours.     CBG (last 3)  Recent Labs    05/28/23 2043 05/28/23 2122 05/28/23 2159  GLUCAP 49* 45* 190*    Cultures:    Component Value Date/Time   SDES URINE, CLEAN CATCH 03/27/2022 2208   SPECREQUEST NONE 03/27/2022 2208   CULT (A) 03/27/2022 2208    20,000 COLONIES/mL LACTOBACILLUS SPECIES Standardized susceptibility testing for this organism is not available. Performed at [facility], [address], [place], [place] [address]    REPTSTATUS 03/29/2022 FINAL 03/27/2022 2208     Radiological Exams on Admission: DG Chest 2 View  Result Date: 05/28/2023 CLINICAL DATA:  Shortness of breath. EXAM: CHEST - 2 VIEW COMPARISON:  February 11, 2022 FINDINGS: The heart size and mediastinal contours are within normal limits. A small curvilinear surgical suture is seen overlying the medial aspect of the left apex. This is located within the superficial soft tissues of the upper  back on the lateral view. Both lungs are clear. The visualized skeletal structures are unremarkable. IMPRESSION: No active cardiopulmonary disease. Electronically Signed   By: Aram Candela M.D.   On: 05/28/2023 18:53   _______________________________________________________________________________________________________ Latest  Blood pressure (!) 104/59, pulse 83, temperature 97.7 F (36.5 C), temperature source Oral, resp. rate 16, height 5\' 4"  (1.626 m), weight 59 kg, SpO2 98 %.   Vitals  labs and radiology finding personally reviewed  Review of Systems:    Pertinent positives include:   chills, fatigue, shortness of breath at rest.   dyspnea on exertion, Constitutional:  No weight loss, night sweats, Fevers, weight loss  HEENT:  No headaches, Difficulty swallowing,Tooth/dental problems,Sore throat,  No sneezing, itching, ear ache, nasal congestion, post nasal drip,  Cardio-vascular:  No chest pain, Orthopnea, PND, anasarca, dizziness, palpitations.no Bilateral lower extremity swelling  GI:  No heartburn, indigestion, abdominal pain, nausea,  vomiting, diarrhea, change in bowel habits, loss of appetite, melena, blood in stool, hematemesis Resp:  no  No excess mucus, no productive cough, No non-productive cough, No coughing up of blood.No change in color of mucus.No wheezing. Skin:  no rash or lesions. No jaundice GU:  no dysuria, change in color of urine, no urgency or frequency. No straining to urinate.  No flank pain.  Musculoskeletal:  No joint pain or no joint swelling. No decreased range of motion. No back pain.  Psych:  No change in mood or affect. No depression or anxiety. No memory loss.  Neuro: no localizing neurological complaints, no tingling, no weakness, no double vision, no gait abnormality, no slurred speech, no confusion  All systems reviewed and apart from HOPI all are  negative _______________________________________________________________________________________________ Past Medical History:   Past Medical History:  Diagnosis Date   Anxiety    Atrial fibrillation (HCC)    Diabetes mellitus    Hyperlipidemia    Hypertension    Memory loss    Mood disorder (HCC)    Physical exam, annual 10/22/06   Renal cyst     Past Surgical History:  Procedure Laterality Date   ABDOMINAL HYSTERECTOMY      Social History:  Ambulatory   cane,       reports that she has never smoked. She has never used smokeless tobacco. She reports that she does not drink alcohol and does not use drugs.   Family History:  Family History  Problem Relation Age of Onset   Diabetes Sister    ______________________________________________________________________________________________ Allergies: Allergies  Allergen Reactions   Metoprolol Nausea And Vomiting     Prior to Admission medications   Medication Sig Start Date End Date Taking? Authorizing Provider  ACCU-CHEK GUIDE test strip USE AS DIRECTED UP TO THREE TIMES DAILY 01/16/21   [provider]  acetaminophen (TYLENOL) 500 MG tablet Take 500 mg by mouth every 6 (six) hours as needed for pain.    [provider]  apixaban (ELIQUIS) 5 MG TABS tablet Take 1 tablet (5 mg total) by mouth 2 (two) times daily. 11/24/14   Rinaldo Cloud, MD  atenolol (TENORMIN) 50 MG tablet Take 50 mg by mouth daily. 07/29/19   [provider]  atorvastatin (LIPITOR) 20 MG tablet Take 20 mg by mouth every evening.    [provider]  azelastine (ASTELIN) 0.1 % nasal spray Place 1 spray into both nostrils as needed for rhinitis or allergies.  05/24/18   [provider]  Blood Glucose Monitoring Suppl (ACCU-CHEK GUIDE) w/Device KIT USE AS DIRECTED UP TO THREE TIMES DAILY 01/16/21   [provider]  cyclobenzaprine (FLEXERIL) 5 MG tablet Take 1-2 tablets (5-10 mg total) by mouth at bedtime.  06/06/20   Wieters, Hallie C, PA-C  diclofenac Sodium (VOLTAREN) 1 % GEL Apply one application (2g) up to four times daily as needed for pain. 05/19/23   Nira Conn, MD  donepezil (ARICEPT) 10 MG tablet Take 1 tablet (10 mg total) by mouth at bedtime. 06/06/17   Marvel Plan, MD  esomeprazole (NEXIUM) 40 MG capsule Take 40 mg by mouth daily at 12 noon.    [provider]  fluconazole (DIFLUCAN) 150 MG tablet     [provider]  fluticasone (FLONASE) 50 MCG/ACT nasal spray Place 1 spray into both nostrils daily. Patient taking differently: Place 1 spray into both nostrils daily as needed for allergies. 09/15/18   Dahlia Byes A, NP  gabapentin (NEURONTIN) 100  MG capsule Take 100 mg by mouth at bedtime.    [provider]  glimepiride (AMARYL) 4 MG tablet Take 1 tablet (4 mg total) by mouth daily with breakfast. 06/29/22   Charlynne Pander, MD  insulin glargine (LANTUS) 100 UNIT/ML injection Inject 1 Units into the skin every evening.    [provider]  losartan (COZAAR) 25 MG tablet Take 25 mg by mouth daily. 05/03/20   [provider]  metFORMIN (GLUCOPHAGE) 500 MG tablet Take 1 tablet (500 mg total) by mouth 2 (two) times daily with a meal. 06/29/22   Charlynne Pander, MD  metroNIDAZOLE (FLAGYL) 500 MG tablet Take 1 tablet (500 mg total) by mouth 2 (two) times daily. 03/31/22   Milagros Loll, MD  mupirocin ointment (BACTROBAN) 2 % Apply 1 application. topically 2 (two) times daily. To affected area till better 05/09/22   Zenia Resides, MD  nystatin cream (MYCOSTATIN) Apply to affected area 2 times daily 04/02/20   Dahlia Byes A, NP  pantoprazole (PROTONIX) 20 MG tablet Take 1 tablet (20 mg total) by mouth daily. 02/24/20   Coralyn Mark, NP  traMADol (ULTRAM) 50 MG tablet Take 1 tablet (50 mg total) by mouth 2 (two) times daily as needed for severe pain. for pain 11/23/21   Antony Madura, PA-C     ___________________________________________________________________________________________________ Physical Exam:    05/28/2023   10:30 PM 05/28/2023    9:32 PM 05/28/2023    7:11 PM  Vitals with BMI  Systolic 104 141 161  Diastolic 59 69 66  Pulse 83 85 71     1. General:  in No  Acute distress    Chronically ill   -appearing 2. Psychological: Alert and   Oriented 3. Head/ENT:    Dry Mucous Membranes                          Head Non traumatic, neck supple                            Poor Dentition 4. SKIN:  decreased Skin turgor,  Skin clean Dry and intact no rash    5. Heart: Regular rate and rhythm no  Murmur, no Rub or gallop 6. Lungs:   no wheezes or crackles   7. Abdomen: Soft,  non-tender, Non distended   bowel sounds present 8. Lower extremities: no clubbing, cyanosis, no  edema 9. Neurologically Grossly intact, moving all 4 extremities equally   10. MSK: Normal range of motion    Chart has been reviewed  ______________________________________________________________________________________________  Assessment/Plan   82 y.o. female with medical history significant of DM 2, anxiety, A-fib on Eliquis hypertension, dementia Admited for   Shortness of breath    Hypoglycemia    Present on Admission:  Hypoglycemia  Essential hypertension  Cognitive impairment  Chronic atrial fibrillation (HCC)  GERD (gastroesophageal reflux disease)  Shortness of breath  Hypomagnesemia  UTI (urinary tract infection)   Essential hypertension Soft blood pressure  Will allow permissive HTN for tonight    Cognitive impairment Pleasantly confused will monitor for any sign of sundowning continue Aricept  Chronic atrial fibrillation (HCC) Continue Eliquis at 5 mg twice daily when able would resume atenolol 50 mg daily   Type 2 diabetes mellitus without complication, without long-term current use of insulin (HCC) Check hemoglobin A1c Patient states her daughter has been  helping her medications patient  herself is pleasantly confused.  Attempted to contact daughter phone twice so far no answer.  Will need to reattempt in a.m. to see what medications patient is currently taking and if she is still on insulin.  Will hold for tonight as patient has been persistently hypoglycemic  GERD (gastroesophageal reflux disease) Continue Protonix 40 mg a day  Shortness of breath Patient reports 2 episodes of transient shortness of breath She said post to be on Eliquis unsure if she is actually taking it. Will obtain CTA Troponin unremarkable EKG nonischemic Continue to monitor Patient currently has no complaints Given her confusion and dementia it is possible that she had more of an episode of hypoglycemia which she interpreted as shortness of breath  Hypomagnesemia Will replace  UTI (urinary tract infection)  - treat with Rocephin         await results of urine culture and adjust antibiotic coverage as needed    Other plan as per orders.  DVT prophylaxis:  Eliquis    Code Status:    Code Status: Prior FULL CODE as per patient   I had personally discussed CODE STATUS with patient    ACP none   Family Communication:   Family not at  Bedside    Diet heart healthy   Disposition Plan:      To home once workup is complete and patient is stable   Following barriers for discharge:                            Electrolytes corrected                               BG improves       Consult Orders  (From admission, onward)           Start     Ordered   05/29/23 2309  Nutritional services consult  Once       Provider:  (Not yet assigned)  Question:  Reason for Consult?  Answer:  assess nutrtional status   05/28/23 2309   05/28/23 2212  Consult to hospitalist  Once       Provider:  (Not yet assigned)  Question Answer Comment  Place call to: [facility]   Reason for Consult Admit      05/28/23 2211                               Would  benefit from PT/OT eval prior to DC  Ordered                     Consults called: none   Admission status:  ED Disposition     ED Disposition  Admit   Condition  --   Comment  Hospital Area: [facility] [[id number]]  Level of Care: Progressive [102]  Admit to Progressive based on following criteria: NEUROLOGICAL AND NEUROSURGICAL complex patients with significant risk of instability, who do not meet ICU criteria, yet require close observation or frequent assessment (< / = every 2 - 4 hours) with medical / nursing intervention.  May place patient in observation at [facility] or [facility] if equivalent level of care is available:: No  Covid Evaluation: Asymptomatic - no recent exposure (last 10 days) testing not required  Diagnosis: Hypoglycemia [[id number]]  Admitting Physician:  Therisa Doyne [[id number]]  Attending Physician: Therisa Doyne [[id number]]          Obs     Level of care       progressive tele indefinitely please discontinue once patient no longer qualifies COVID-19 Labs   Arn Mcomber 05/28/2023, 11:26 PM    [facility]     after 2 AM please page floor coverage PA If 7AM-7PM, please contact the day team taking care of the patient using [email or web address]

## 2023-05-28 NOTE — Assessment & Plan Note (Signed)
Check hemoglobin A1c Patient states her daughter has been helping her medications patient herself is pleasantly confused.  Attempted to contact daughter phone twice so far no answer.  Will need to reattempt in a.m. to see what medications patient is currently taking and if she is still on insulin.  Will hold for tonight as patient has been persistently hypoglycemic

## 2023-05-28 NOTE — ED Notes (Signed)
Notified EDP about pt's hypoglycemia despite oral fluids and food, provided D50 via verbal order.

## 2023-05-28 NOTE — ED Triage Notes (Signed)
Patient is here for evaluation of breathing. Reports, "I felt like I could not breathe and every time I would breathe, I would get dizzy." Reports that this has resolved and now she is breathing "a lot better."

## 2023-05-28 NOTE — Subjective & Objective (Signed)
Patient reports she has been having more shortness of breath and feels lightheaded this was a transient episode and now feeling better On arrival she was noted to be hypoglycemic with blood sugars in 49 Patient has known history of diabetes Reports that she had a sudden onset of shortness of breath around 9:30 at night yesterday associated with left-sided chest tingling lasted for about 15 minutes and then improved when she woke up she had repeat episode again lasted about 15 minutes Does not smoke no fevers no chills Had in the past panic attacks thinks maybe it could be that Now she feels back to baseline

## 2023-05-28 NOTE — ED Provider Notes (Addendum)
[place] [facility]ERGENC[facility] DEPARTMENT AT [facility] Provider Note   CSN: [id number] Arrival date & time: 05/28/23  1502     History  Chief Complaint  Patient presents with   Shortness of Breath   Dizziness    Jacqueline Ball is a 82 y.o. female with medical history of anxiety, atrial fibrillation on anticoagulation, diabetes, hypertension, memory loss.  The patient presents to ED for evaluation of shortness of breath.  The patient states that last night around 9:30 PM she had a sudden onset of feeling as if she could not breathe.  She states that she also had some left-sided chest tingling but is unsure how long this lasted.  She states that this feeling as if she cannot breathe lasted for about 15 minutes and then resolved.  She states that she went to bed and woke up this morning and she had return of this feeling as if she could not breathe.  She states that this again lasted for about 15 minutes and then resolved.  She denies any chest pain with the episode this morning.  She denies lightheadedness, dizziness, weakness, syncope.  She denies nausea vomiting abdominal pain.  She states she does not smoke.  She denies of ever feeling this way before in the past.  She states she does have history of panic attacks and she believes she might of had a panic attack last night.  Patient denies any feelings of shortness of breath at this time.   Shortness of Breath Associated symptoms: chest pain   Dizziness Associated symptoms: chest pain and shortness of breath        Home Medications Prior to Admission medications   Medication Sig Start Date End Date Taking? Authorizing Provider  ACCU-CHEK GUIDE test strip USE AS DIRECTED UP TO THREE TIMES DAILY 01/16/21   [provider]  acetaminophen (TYLENOL) 500 MG tablet Take 500 mg by mouth every 6 (six) hours as needed for pain.    [provider]  apixaban (ELIQUIS) 5 MG TABS tablet Take 1 tablet (5 mg total) by mouth 2 (two)  times daily. 11/24/14   Rinaldo Cloud, MD  atenolol (TENORMIN) 50 MG tablet Take 50 mg by mouth daily. 07/29/19   [provider]  atorvastatin (LIPITOR) 20 MG tablet Take 20 mg by mouth every evening.    [provider]  azelastine (ASTELIN) 0.1 % nasal spray Place 1 spray into both nostrils as needed for rhinitis or allergies.  05/24/18   [provider]  Blood Glucose Monitoring Suppl (ACCU-CHEK GUIDE) w/Device KIT USE AS DIRECTED UP TO THREE TIMES DAILY 01/16/21   [provider]  cyclobenzaprine (FLEXERIL) 5 MG tablet Take 1-2 tablets (5-10 mg total) by mouth at bedtime. 06/06/20   Wieters, Hallie C, PA-C  diclofenac Sodium (VOLTAREN) 1 % GEL Apply one application (2g) up to four times daily as needed for pain. 05/19/23   Nira Conn, MD  donepezil (ARICEPT) 10 MG tablet Take 1 tablet (10 mg total) by mouth at bedtime. 06/06/17   Marvel Plan, MD  esomeprazole (NEXIUM) 40 MG capsule Take 40 mg by mouth daily at 12 noon.    [provider]  fluconazole (DIFLUCAN) 150 MG tablet     [provider]  fluticasone (FLONASE) 50 MCG/ACT nasal spray Place 1 spray into both nostrils daily. Patient taking differently: Place 1 spray into both nostrils daily as needed for allergies. 09/15/18   Dahlia Byes A, NP  gabapentin (NEURONTIN) 100 MG  capsule Take 100 mg by mouth at bedtime.    [provider]  glimepiride (AMARYL) 4 MG tablet Take 1 tablet (4 mg total) by mouth daily with breakfast. 06/29/22   Charlynne Pander, MD  insulin glargine (LANTUS) 100 UNIT/ML injection Inject 1 Units into the skin every evening.    [provider]  losartan (COZAAR) 25 MG tablet Take 25 mg by mouth daily. 05/03/20   [provider]  metFORMIN (GLUCOPHAGE) 500 MG tablet Take 1 tablet (500 mg total) by mouth 2 (two) times daily with a meal. 06/29/22   Charlynne Pander, MD  metroNIDAZOLE (FLAGYL) 500 MG tablet Take 1 tablet (500 mg total) by  mouth 2 (two) times daily. 03/31/22   Milagros Loll, MD  mupirocin ointment (BACTROBAN) 2 % Apply 1 application. topically 2 (two) times daily. To affected area till better 05/09/22   Zenia Resides, MD  nystatin cream (MYCOSTATIN) Apply to affected area 2 times daily 04/02/20   Dahlia Byes A, NP  pantoprazole (PROTONIX) 20 MG tablet Take 1 tablet (20 mg total) by mouth daily. 02/24/20   Coralyn Mark, NP  traMADol (ULTRAM) 50 MG tablet Take 1 tablet (50 mg total) by mouth 2 (two) times daily as needed for severe pain. for pain 11/23/21   Antony Madura, PA-C      Allergies    Metoprolol    Review of Systems   Review of Systems  Respiratory:  Positive for shortness of breath.   Cardiovascular:  Positive for chest pain.  All other systems reviewed and are negative.   Physical Exam Updated Vital Signs BP (!) 141/69 (BP Location: Left Arm)   Pulse 85   Temp 97.7 F (36.5 C) (Oral)   Resp 16   Ht 5\' 4"  (1.626 m)   Wt 59 kg   SpO2 98%   BMI 22.31 kg/m  Physical Exam Vitals and nursing note reviewed.  Constitutional:      General: She is not in acute distress.    Appearance: Normal appearance. She is not ill-appearing, toxic-appearing or diaphoretic.  HENT:     Head: Normocephalic and atraumatic.     Nose: Nose normal.     Mouth/Throat:     Mouth: Mucous membranes are moist.     Pharynx: Oropharynx is clear.  Eyes:     Extraocular Movements: Extraocular movements intact.     Conjunctiva/sclera: Conjunctivae normal.     Pupils: Pupils are equal, round, and reactive to light.  Cardiovascular:     Rate and Rhythm: Normal rate and regular rhythm.  Pulmonary:     Effort: Pulmonary effort is normal.     Breath sounds: Normal breath sounds. No wheezing.  Abdominal:     General: Abdomen is flat. Bowel sounds are normal.     Palpations: Abdomen is soft.     Tenderness: There is no abdominal tenderness. There is no right CVA tenderness or left CVA tenderness.   Musculoskeletal:     Cervical back: Normal range of motion and neck supple. No tenderness.  Skin:    General: Skin is warm and dry.     Capillary Refill: Capillary refill takes less than 2 seconds.  Neurological:     Mental Status: She is alert and oriented to person, place, and time.     ED Results / Procedures / Treatments   Labs (all labs ordered are listed, but only abnormal results are displayed) Labs Reviewed  BASIC METABOLIC PANEL - Abnormal; Notable  for the following components:      Result Value   Glucose, Bld 61 (*)    All other components within normal limits  CBC - Abnormal; Notable for the following components:   RBC 3.79 (*)    All other components within normal limits  CBG MONITORING, ED - Abnormal; Notable for the following components:   Glucose-Capillary 49 (*)    All other components within normal limits  CBG MONITORING, ED - Abnormal; Notable for the following components:   Glucose-Capillary 45 (*)    All other components within normal limits  CBG MONITORING, ED - Abnormal; Notable for the following components:   Glucose-Capillary 190 (*)    All other components within normal limits  TROPONIN I (HIGH SENSITIVITY)    EKG EKG Interpretation  Date/Time:  Tuesday May 28 2023 15:12:28 EDT Ventricular Rate:  61 PR Interval:  181 QRS Duration: 82 QT Interval:  444 QTC Calculation: 448 R Axis:   9 Text Interpretation: Sinus rhythm Nonspecific T abnormalities, lateral leads Confirmed by Vonita Moss [phone]) on 05/28/2023 5:03:17 PM  Radiology DG Chest 2 View  Result Date: 05/28/2023 CLINICAL DATA:  Shortness of breath. EXAM: CHEST - 2 VIEW COMPARISON:  February 11, 2022 FINDINGS: The heart size and mediastinal contours are within normal limits. A small curvilinear surgical suture is seen overlying the medial aspect of the left apex. This is located within the superficial soft tissues of the upper back on the lateral view. Both lungs are clear. The  visualized skeletal structures are unremarkable. IMPRESSION: No active cardiopulmonary disease. Electronically Signed   By: Aram Candela M.D.   On: 05/28/2023 18:53    Procedures Procedures   Medications Ordered in ED Medications  dextrose 50 % solution 50 mL (50 mLs Intravenous Given 05/28/23 2129)    ED Course/ Medical Decision Making/ A&P    Medical Decision Making Amount and/or Complexity of Data Reviewed Labs: ordered. Radiology: ordered.  Risk Prescription drug management. Decision regarding hospitalization.   82 year old female presents to ED for evaluation.  Please see HPI for further details.  On examination the patient is afebrile and nontachycardic.  Her lung sounds are clear bilaterally and she is not hypoxic on room air.  Her abdomen is soft and compressible throughout.  Neurological examination at baseline.  She is alert and oriented x 4.  Her oxygen saturation is 100% on the monitor in the room.  She is not tachypneic.  Overall nontoxic in appearance.  Patient CBC without leukocytosis or anemia.  Metabolic panel without electrolyte derangement however does show glucose of 61, on the fingerstick it shows glucose of 49.  Patient has been given amp of D50.[name]  Patient troponin 3.  Patient chest x-ray unremarkable.  EKG is nonischemic.  I feel that the chances of this patient having an PE are very low as she states that she is compliant on her Eliquis which she has been on for quite some time secondary to atrial fibrillation.  She reports episode of feeling as if she cannot breathe occurred 2 times, once last night and 1 this morning.  She denies that this feeling as if she cannot breathe has occurred since this morning.  She is unsure if this feeling was related to her anxiety.  She is unsure if perhaps she was having a panic attack during this time.  At this time, she feels completely asymptomatic.  Due to low blood sugar, patient will require admission.  Patient signed  out to attending Dr.  Curatolo.     Final Clinical Impression(s) / ED Diagnoses Final diagnoses:  Shortness of breath  Hypoglycemia    Rx / DC Orders ED Discharge Orders     None             Al Decant, PA-C 05/28/23 2221    Virgina Norfolk, DO 05/28/23 2236    Virgina Norfolk, DO 05/28/23 2252

## 2023-05-28 NOTE — Assessment & Plan Note (Signed)
Pleasantly confused will monitor for any sign of sundowning continue Aricept

## 2023-05-28 NOTE — Assessment & Plan Note (Signed)
Continue Eliquis at 5 mg twice daily when able would resume atenolol 50 mg daily

## 2023-05-28 NOTE — Assessment & Plan Note (Signed)
Continue Protonix 40 mg a day

## 2023-05-28 NOTE — Assessment & Plan Note (Signed)
Patient reports 2 episodes of transient shortness of breath She said post to be on Eliquis unsure if she is actually taking it. Will obtain CTA Troponin unremarkable EKG nonischemic Continue to monitor Patient currently has no complaints Given her confusion and dementia it is possible that she had more of an episode of hypoglycemia which she interpreted as shortness of breath

## 2023-05-28 NOTE — Assessment & Plan Note (Signed)
Soft blood pressure  Will allow permissive HTN for tonight

## 2023-05-29 DIAGNOSIS — R419 Unspecified symptoms and signs involving cognitive functions and awareness: Secondary | ICD-10-CM | POA: Diagnosis not present

## 2023-05-29 DIAGNOSIS — Z7984 Long term (current) use of oral hypoglycemic drugs: Secondary | ICD-10-CM | POA: Diagnosis not present

## 2023-05-29 DIAGNOSIS — F0390 Unspecified dementia without behavioral disturbance: Secondary | ICD-10-CM | POA: Diagnosis not present

## 2023-05-29 DIAGNOSIS — E11649 Type 2 diabetes mellitus with hypoglycemia without coma: Secondary | ICD-10-CM | POA: Diagnosis not present

## 2023-05-29 DIAGNOSIS — Z7901 Long term (current) use of anticoagulants: Secondary | ICD-10-CM | POA: Diagnosis not present

## 2023-05-29 DIAGNOSIS — I10 Essential (primary) hypertension: Secondary | ICD-10-CM | POA: Diagnosis not present

## 2023-05-29 DIAGNOSIS — Z794 Long term (current) use of insulin: Secondary | ICD-10-CM | POA: Diagnosis not present

## 2023-05-29 DIAGNOSIS — N390 Urinary tract infection, site not specified: Secondary | ICD-10-CM | POA: Diagnosis present

## 2023-05-29 DIAGNOSIS — E162 Hypoglycemia, unspecified: Secondary | ICD-10-CM | POA: Diagnosis not present

## 2023-05-29 DIAGNOSIS — Z79899 Other long term (current) drug therapy: Secondary | ICD-10-CM | POA: Diagnosis not present

## 2023-05-29 DIAGNOSIS — E440 Moderate protein-calorie malnutrition: Secondary | ICD-10-CM | POA: Insufficient documentation

## 2023-05-29 DIAGNOSIS — R0602 Shortness of breath: Secondary | ICD-10-CM | POA: Diagnosis not present

## 2023-05-29 DIAGNOSIS — I482 Chronic atrial fibrillation, unspecified: Secondary | ICD-10-CM | POA: Diagnosis not present

## 2023-05-29 DIAGNOSIS — E876 Hypokalemia: Secondary | ICD-10-CM | POA: Diagnosis present

## 2023-05-29 LAB — CBC
HCT: 37.3 % (ref 36.0–46.0)
Hemoglobin: 12.2 g/dL (ref 12.0–15.0)
MCH: 31.9 pg (ref 26.0–34.0)
MCHC: 32.7 g/dL (ref 30.0–36.0)
MCV: 97.4 fL (ref 80.0–100.0)
Platelets: 319 10*3/uL (ref 150–400)
RBC: 3.83 MIL/uL — ABNORMAL LOW (ref 3.87–5.11)
RDW: 12.7 % (ref 11.5–15.5)
WBC: 8.1 10*3/uL (ref 4.0–10.5)
nRBC: 0 % (ref 0.0–0.2)

## 2023-05-29 LAB — URINALYSIS, COMPLETE (UACMP) WITH MICROSCOPIC
Bilirubin Urine: NEGATIVE
Glucose, UA: 50 mg/dL — AB
Hgb urine dipstick: NEGATIVE
Ketones, ur: NEGATIVE mg/dL
Nitrite: NEGATIVE
Protein, ur: NEGATIVE mg/dL
Specific Gravity, Urine: 1.031 — ABNORMAL HIGH (ref 1.005–1.030)
pH: 5 (ref 5.0–8.0)

## 2023-05-29 LAB — OSMOLALITY, URINE: Osmolality, Ur: 550 mOsm/kg (ref 300–900)

## 2023-05-29 LAB — COMPREHENSIVE METABOLIC PANEL
ALT: 16 U/L (ref 0–44)
AST: 17 U/L (ref 15–41)
Albumin: 3.5 g/dL (ref 3.5–5.0)
Alkaline Phosphatase: 73 U/L (ref 38–126)
Anion gap: 9 (ref 5–15)
BUN: 16 mg/dL (ref 8–23)
CO2: 26 mmol/L (ref 22–32)
Calcium: 9.7 mg/dL (ref 8.9–10.3)
Chloride: 100 mmol/L (ref 98–111)
Creatinine, Ser: 0.82 mg/dL (ref 0.44–1.00)
GFR, Estimated: >60 mL/min (ref >60)
Glucose, Bld: 201 mg/dL — ABNORMAL HIGH (ref 70–99)
Potassium: 3.8 mmol/L (ref 3.5–5.1)
Sodium: 135 mmol/L (ref 135–145)
Total Bilirubin: 0.2 mg/dL — ABNORMAL LOW (ref 0.3–1.2)
Total Protein: 6.9 g/dL (ref 6.5–8.1)

## 2023-05-29 LAB — PHOSPHORUS
Phosphorus: 3.8 mg/dL (ref 2.5–4.6)
Phosphorus: 3.4 mg/dL (ref 2.5–4.6)

## 2023-05-29 LAB — HEPATIC FUNCTION PANEL
ALT: 16 U/L (ref 0–44)
AST: 15 U/L (ref 15–41)
Albumin: 3.5 g/dL (ref 3.5–5.0)
Alkaline Phosphatase: 72 U/L (ref 38–126)
Bilirubin, Direct: <0.1 mg/dL (ref 0.0–0.2)
Total Bilirubin: 0.3 mg/dL (ref 0.3–1.2)
Total Protein: 6.8 g/dL (ref 6.5–8.1)

## 2023-05-29 LAB — GLUCOSE, CAPILLARY
Glucose-Capillary: 110 mg/dL — ABNORMAL HIGH (ref 70–99)
Glucose-Capillary: 187 mg/dL — ABNORMAL HIGH (ref 70–99)
Glucose-Capillary: 188 mg/dL — ABNORMAL HIGH (ref 70–99)
Glucose-Capillary: 140 mg/dL — ABNORMAL HIGH (ref 70–99)
Glucose-Capillary: 204 mg/dL — ABNORMAL HIGH (ref 70–99)
Glucose-Capillary: 168 mg/dL — ABNORMAL HIGH (ref 70–99)
Glucose-Capillary: 164 mg/dL — ABNORMAL HIGH (ref 70–99)

## 2023-05-29 LAB — CK: Total CK: 68 U/L (ref 38–234)

## 2023-05-29 LAB — SODIUM, URINE, RANDOM: Sodium, Ur: 64 mmol/L

## 2023-05-29 LAB — CREATININE, URINE, RANDOM: Creatinine, Urine: 116 mg/dL

## 2023-05-29 LAB — HEMOGLOBIN A1C
Hgb A1c MFr Bld: 7.4 % — ABNORMAL HIGH (ref 4.8–5.6)
Mean Plasma Glucose: 165.68 mg/dL

## 2023-05-29 LAB — OSMOLALITY: Osmolality: 299 mOsm/kg — ABNORMAL HIGH (ref 275–295)

## 2023-05-29 LAB — MAGNESIUM
Magnesium: 1.4 mg/dL — ABNORMAL LOW (ref 1.7–2.4)
Magnesium: 1.7 mg/dL (ref 1.7–2.4)

## 2023-05-29 LAB — BETA-HYDROXYBUTYRIC ACID: Beta-Hydroxybutyric Acid: 0.15 mmol/L (ref 0.05–0.27)

## 2023-05-29 LAB — PREALBUMIN: Prealbumin: 23 mg/dL (ref 18–38)

## 2023-05-29 LAB — TROPONIN I (HIGH SENSITIVITY): Troponin I (High Sensitivity): 4 ng/L (ref <18)

## 2023-05-29 MED ORDER — CEPHALEXIN 500 MG PO CAPS: 500.0000 mg | ORAL_CAPSULE | Freq: Three times a day (TID) | ORAL | 0 refills | Status: DC

## 2023-05-29 MED ORDER — SENNA 8.6 MG PO TABS
1.0000 | ORAL_TABLET | Freq: Two times a day (BID) | ORAL | Status: DC
  Administered 2023-05-29: 8.6 mg via ORAL
  Filled 2023-05-29 (×2): qty 1

## 2023-05-29 MED ORDER — ATORVASTATIN CALCIUM 20 MG PO TABS: 20.0000 mg | ORAL_TABLET | Freq: Every evening | ORAL | Status: DC

## 2023-05-29 MED ORDER — DOCUSATE SODIUM 100 MG PO CAPS
100.0000 mg | ORAL_CAPSULE | Freq: Two times a day (BID) | ORAL | Status: DC
  Administered 2023-05-29 (×2): 100 mg via ORAL
  Filled 2023-05-29 (×2): qty 1

## 2023-05-29 MED ORDER — SODIUM CHLORIDE 0.9 % IV SOLN
1.0000 g | INTRAVENOUS | Status: DC
  Administered 2023-05-29: 1 g via INTRAVENOUS
  Filled 2023-05-29: qty 10

## 2023-05-29 MED ORDER — APIXABAN 2.5 MG PO TABS
2.5000 mg | ORAL_TABLET | Freq: Two times a day (BID) | ORAL | Status: DC
  Administered 2023-05-29: 2.5 mg via ORAL
  Filled 2023-05-29: qty 1

## 2023-05-29 MED ORDER — GUAIFENESIN ER 600 MG PO TB12
600.0000 mg | ORAL_TABLET | Freq: Two times a day (BID) | ORAL | Status: DC
  Administered 2023-05-29 (×2): 600 mg via ORAL
  Filled 2023-05-29 (×2): qty 1

## 2023-05-29 MED ORDER — ALBUTEROL SULFATE (2.5 MG/3ML) 0.083% IN NEBU: 2.5000 mg | INHALATION_SOLUTION | RESPIRATORY_TRACT | Status: DC | PRN

## 2023-05-29 MED ORDER — ACETAMINOPHEN 650 MG RE SUPP: 650.0000 mg | Freq: Four times a day (QID) | RECTAL | Status: DC | PRN

## 2023-05-29 MED ORDER — ONDANSETRON HCL 4 MG/2ML IJ SOLN: 4.0000 mg | Freq: Four times a day (QID) | INTRAMUSCULAR | Status: DC | PRN

## 2023-05-29 MED ORDER — INSULIN GLARGINE 100 UNIT/ML ~~LOC~~ SOLN: 12.0000 [IU] | Freq: Every evening | SUBCUTANEOUS | 11 refills | Status: DC

## 2023-05-29 MED ORDER — MAGNESIUM SULFATE 2 GM/50ML IV SOLN
2.0000 g | Freq: Once | INTRAVENOUS | Status: AC
  Administered 2023-05-29: 2 g via INTRAVENOUS
  Filled 2023-05-29: qty 50

## 2023-05-29 MED ORDER — ONDANSETRON HCL 4 MG PO TABS: 4.0000 mg | ORAL_TABLET | Freq: Four times a day (QID) | ORAL | Status: DC | PRN

## 2023-05-29 MED ORDER — POLYETHYLENE GLYCOL 3350 17 G PO PACK: 17.0000 g | PACK | Freq: Every day | ORAL | Status: DC | PRN

## 2023-05-29 MED ORDER — CEPHALEXIN 500 MG PO CAPS: 500.0000 mg | ORAL_CAPSULE | Freq: Three times a day (TID) | ORAL | 0 refills | Status: AC

## 2023-05-29 MED ORDER — HYDROCODONE-ACETAMINOPHEN 5-325 MG PO TABS: 1.0000 | ORAL_TABLET | ORAL | Status: DC | PRN

## 2023-05-29 MED ORDER — DEXTROSE IN LACTATED RINGERS 5 % IV SOLN: INTRAVENOUS | Status: DC

## 2023-05-29 MED ORDER — ACETAMINOPHEN 325 MG PO TABS: 650.0000 mg | ORAL_TABLET | Freq: Four times a day (QID) | ORAL | Status: DC | PRN

## 2023-05-29 MED ORDER — CEPHALEXIN 500 MG PO CAPS
500.0000 mg | ORAL_CAPSULE | Freq: Three times a day (TID) | ORAL | Status: DC
  Administered 2023-05-29: 500 mg via ORAL
  Filled 2023-05-29: qty 1

## 2023-05-29 MED ORDER — DONEPEZIL HCL 10 MG PO TABS: 10.0000 mg | ORAL_TABLET | Freq: Every day | ORAL | Status: DC

## 2023-05-29 MED ORDER — PANTOPRAZOLE SODIUM 20 MG PO TBEC: 20.0000 mg | DELAYED_RELEASE_TABLET | Freq: Every day | ORAL | Status: DC

## 2023-05-29 MED ORDER — PANTOPRAZOLE SODIUM 40 MG PO TBEC
40.0000 mg | DELAYED_RELEASE_TABLET | Freq: Every day | ORAL | Status: DC
  Administered 2023-05-29: 40 mg via ORAL
  Filled 2023-05-29: qty 1

## 2023-05-29 MED ORDER — ENSURE ENLIVE PO LIQD: 237.0000 mL | Freq: Two times a day (BID) | ORAL | Status: DC

## 2023-05-29 NOTE — TOC Transition Note (Signed)
Transition of Care [facility]) - CM/SW Discharge Note   Patient Details  Name: Jacqueline Ball MRN: [id number] Date of Birth: [birth year]  Transition of Care [facility]) CM/SW Contact:  Howell Rucks, RN Phone Number: 05/29/2023, 3:31 PM   Clinical Narrative:   PT recommendation for Home Safety Evaluation. NCM request attending to enter [facility] PT/OT/RN/CSW.     -3:20pm [facility] PT/OT/RN/CSW. NCM outreached to pt's spouse and dtr [name]) to confirm transportation, no answer, vm left requesting call back. Floor nurse reports pt's family stated they will pick pt up between 5 and 6pm. No further TOC needs identified   Final next level of care: Home w Home Health Services Barriers to Discharge: Barriers Resolved   Patient Goals and CMS Choice CMS Medicare.gov Compare Post Acute Care list provided to:: Patient Represenative (must comment) (dtr ([name])) Choice offered to / list presented to : Adult Children  Discharge Placement                    Name of family member notified: [name] Patient and family notified of of transfer: 05/29/23  Discharge Plan and Services Additional resources added to the After Visit Summary for                            [facility] Arranged: PT, OT, RN, Social Work [employer] Agency: [facility] Date [facility] Agency Contacted: 05/29/23 Time HH Agency Contacted: 1526 Representative spoke with at [facility] Agency: Tresa Endo  Social Determinants of Health (SDOH) Interventions SDOH Screenings   Food Insecurity: No Food Insecurity (05/29/2023)  Housing: Low Risk  (05/29/2023)  Transportation Needs: No Transportation Needs (05/29/2023)  Utilities: Not At Risk (05/29/2023)  Tobacco Use: Low Risk  (05/28/2023)     Readmission Risk Interventions     No data to display

## 2023-05-29 NOTE — Plan of Care (Signed)

## 2023-05-29 NOTE — ED Notes (Signed)
ED TO INPATIENT HANDOFF REPORT  Name/Age/Gender [name] 82 y.o. female  Code Status    Code Status Orders  (From admission, onward)           Start     Ordered   05/28/23 2309  Full code  Continuous       Question:  By:  Answer:  Consent: discussion documented in EHR   05/28/23 2309           Code Status History     Date Active Date Inactive Code Status Order ID Comments User Context   11/22/2014 2239 11/24/2014 2234 Full Code [id number]  Rinaldo Cloud, MD Inpatient       Home/SNF/Other Home  Chief Complaint Hypoglycemia [E16.2]  Level of Care/Admitting Diagnosis ED Disposition     ED Disposition  Admit   Condition  --   Comment  Hospital Area: [facility] [[id number]]  Level of Care: Progressive [102]  Admit to Progressive based on following criteria: NEUROLOGICAL AND NEUROSURGICAL complex patients with significant risk of instability, who do not meet ICU criteria, yet require close observation or frequent assessment (< / = every 2 - 4 hours) with medical / nursing intervention.  May place patient in observation at [facility] or [facility] if equivalent level of care is available:: No  Covid Evaluation: Asymptomatic - no recent exposure (last 10 days) testing not required  Diagnosis: Hypoglycemia [413244]  Admitting Physician: Therisa Doyne [[id number]]  Attending Physician: Therisa Doyne [[id number]]          Medical History Past Medical History:  Diagnosis Date   Anxiety    Atrial fibrillation (HCC)    Diabetes mellitus    Hyperlipidemia    Hypertension    Memory loss    Mood disorder (HCC)    Physical exam, annual 10/22/06   Renal cyst     Allergies Allergies  Allergen Reactions   Metoprolol Nausea And Vomiting    IV Location/Drains/Wounds Patient Lines/Drains/Airways Status     Active Line/Drains/Airways     Name Placement date Placement time Site Days   Peripheral IV 05/28/23 20 G Right Antecubital  05/28/23  1725  Antecubital  1            Labs/Imaging Results for orders placed or performed during the hospital encounter of 05/28/23 (from the past 48 hour(s))  Basic metabolic panel     Status: Abnormal   Collection Time: 05/28/23  5:30 PM  Result Value Ref Range   Sodium 138 135 - 145 mmol/L   Potassium 4.1 3.5 - 5.1 mmol/L   Chloride 104 98 - 111 mmol/L   CO2 25 22 - 32 mmol/L   Glucose, Bld 61 (L) 70 - 99 mg/dL    Comment: Glucose reference range applies only to samples taken after fasting for at least 8 hours.   BUN 15 8 - 23 mg/dL   Creatinine, Ser 0.10 0.44 - 1.00 mg/dL   Calcium 9.8 8.9 - 27.2 mg/dL   GFR, Estimated >53 >66 mL/min    Comment: (NOTE) Calculated using the CKD-EPI Creatinine Equation (2021)    Anion gap 9 5 - 15    Comment: Performed at [facility], [address], [place], [place] [address]  CBC     Status: Abnormal   Collection Time: 05/28/23  5:30 PM  Result Value Ref Range   WBC 8.2 4.0 - 10.5 K/uL   RBC 3.79 (L) 3.87 - 5.11 MIL/uL   Hemoglobin 12.2  12.0 - 15.0 g/dL   HCT 16.1 09.6 - 04.5 %   MCV 98.7 80.0 - 100.0 fL   MCH 32.2 26.0 - 34.0 pg   MCHC 32.6 30.0 - 36.0 g/dL   RDW 40.9 81.1 - 91.4 %   Platelets 329 150 - 400 K/uL   nRBC 0.0 0.0 - 0.2 %    Comment: Performed at [facility], [address] [place], [place] [address]  Troponin I (High Sensitivity)     Status: None   Collection Time: 05/28/23  5:30 PM  Result Value Ref Range   Troponin I (High Sensitivity) 3 <18 ng/L    Comment: (NOTE) Elevated high sensitivity troponin I (hsTnI) values and significant  changes across serial measurements may suggest ACS but many other  chronic and acute conditions are known to elevate hsTnI results.  Refer to the "Links" section for chest pain algorithms and additional  guidance. Performed at [facility], [address], [place], [place] [address]   POC CBG, ED     Status: Abnormal    Collection Time: 05/28/23  8:43 PM  Result Value Ref Range   Glucose-Capillary 49 (L) 70 - 99 mg/dL    Comment: Glucose reference range applies only to samples taken after fasting for at least 8 hours.  POC CBG, ED     Status: Abnormal   Collection Time: 05/28/23  9:22 PM  Result Value Ref Range   Glucose-Capillary 45 (L) 70 - 99 mg/dL    Comment: Glucose reference range applies only to samples taken after fasting for at least 8 hours.  POC CBG, ED     Status: Abnormal   Collection Time: 05/28/23  9:59 PM  Result Value Ref Range   Glucose-Capillary 190 (H) 70 - 99 mg/dL    Comment: Glucose reference range applies only to samples taken after fasting for at least 8 hours.  Troponin I (High Sensitivity)     Status: None   Collection Time: 05/28/23 10:54 PM  Result Value Ref Range   Troponin I (High Sensitivity) 3 <18 ng/L    Comment: (NOTE) Elevated high sensitivity troponin I (hsTnI) values and significant  changes across serial measurements may suggest ACS but many other  chronic and acute conditions are known to elevate hsTnI results.  Refer to the "Links" section for chest pain algorithms and additional  guidance. Performed at [facility], [address], [place], [place] [address]   CK     Status: None   Collection Time: 05/28/23 10:54 PM  Result Value Ref Range   Total CK 68 38 - 234 U/L    Comment: Performed at [facility], [address], [place], [place] [address]  Magnesium     Status: Abnormal   Collection Time: 05/28/23 10:54 PM  Result Value Ref Range   Magnesium 1.4 (L) 1.7 - 2.4 mg/dL    Comment: Performed at [facility], [address], [place], [place] [address]  Phosphorus     Status: None   Collection Time: 05/28/23 10:54 PM  Result Value Ref Range   Phosphorus 3.8 2.5 - 4.6 mg/dL    Comment: Performed at [facility], [address] [place], [place] [address]  POC CBG, ED      Status: Abnormal   Collection Time: 05/28/23 11:45 PM  Result Value Ref Range   Glucose-Capillary 185 (H) 70 - 99 mg/dL    Comment: Glucose reference range  applies only to samples taken after fasting for at least 8 hours.  Urinalysis, Complete w Microscopic -Urine, Clean Catch     Status: Abnormal   Collection Time: 05/29/23 12:27 AM  Result Value Ref Range   Color, Urine YELLOW YELLOW   APPearance HAZY (A) CLEAR   Specific Gravity, Urine 1.031 (H) 1.005 - 1.030   pH 5.0 5.0 - 8.0   Glucose, UA 50 (A) NEGATIVE mg/dL   Hgb urine dipstick NEGATIVE NEGATIVE   Bilirubin Urine NEGATIVE NEGATIVE   Ketones, ur NEGATIVE NEGATIVE mg/dL   Protein, ur NEGATIVE NEGATIVE mg/dL   Nitrite NEGATIVE NEGATIVE   Leukocytes,Ua MODERATE (A) NEGATIVE   RBC / HPF 6-10 0 - 5 RBC/hpf   WBC, UA 11-20 0 - 5 WBC/hpf   Bacteria, UA MANY (A) NONE SEEN   Squamous Epithelial / HPF 6-10 0 - 5 /HPF   Mucus PRESENT    Budding Yeast PRESENT    Hyaline Casts, UA PRESENT     Comment: Performed at [facility], [address] [place], [place] [address]   CT Angio Chest PE W and/or Wo Contrast  Result Date: 05/29/2023 CLINICAL DATA:  Pulmonary embolism (PE) suspected, high prob. Shortness of breath EXAM: CT ANGIOGRAPHY CHEST WITH CONTRAST TECHNIQUE: Multidetector CT imaging of the chest was performed using the standard protocol during bolus administration of intravenous contrast. Multiplanar CT image reconstructions and MIPs were obtained to evaluate the vascular anatomy. RADIATION DOSE REDUCTION: This exam was performed according to the departmental dose-optimization program which includes automated exposure control, adjustment of the mA and/or kV according to patient size and/or use of iterative reconstruction technique. CONTRAST:  80mL OMNIPAQUE IOHEXOL 350 MG/ML SOLN COMPARISON:  01/23/2012 FINDINGS: Cardiovascular: No filling defects in the pulmonary arteries to suggest pulmonary emboli. Heart is  normal size. Aorta is normal caliber. Scattered coronary artery and aortic calcifications. Mediastinum/Nodes: No mediastinal, hilar, or axillary adenopathy. Trachea and esophagus are unremarkable. Thyroid unremarkable. Lungs/Pleura: No confluent airspace opacities or effusions. Linear areas of atelectasis or scarring in the right lower lobe. Upper Abdomen: No acute findings Musculoskeletal: Chest wall soft tissues are unremarkable. No acute bony abnormality. Review of the MIP images confirms the above findings. IMPRESSION: No evidence of pulmonary embolus. No acute cardiopulmonary disease. Areas of scarring or atelectasis in the right lower lobe. Scattered coronary artery disease. Aortic Atherosclerosis (ICD10-I70.0). Electronically Signed   By: Charlett Nose M.D.   On: 05/29/2023 00:10   DG Chest 2 View  Result Date: 05/28/2023 CLINICAL DATA:  Shortness of breath. EXAM: CHEST - 2 VIEW COMPARISON:  February 11, 2022 FINDINGS: The heart size and mediastinal contours are within normal limits. A small curvilinear surgical suture is seen overlying the medial aspect of the left apex. This is located within the superficial soft tissues of the upper back on the lateral view. Both lungs are clear. The visualized skeletal structures are unremarkable. IMPRESSION: No active cardiopulmonary disease. Electronically Signed   By: Aram Candela M.D.   On: 05/28/2023 18:53    Pending Labs Unresulted Labs (From admission, onward)     Start     Ordered   05/29/23 0500  Prealbumin  Tomorrow morning,   R        05/28/23 2300   05/29/23 0051  Hemoglobin A1c  Add-on,   AD        05/29/23 0050   05/29/23 0048  Urine Culture (for pregnant, neutropenic or urologic patients or patients with an indwelling urinary catheter)  (  Urine Labs)  Once,   R       Question:  Indication  Answer:  Altered mental status (if no other cause identified)   05/29/23 0048   05/28/23 2315  Protein C, total  Once,   R        05/28/23 2315    05/28/23 2315  Protein C activity  Once,   R        05/28/23 2315   05/28/23 2315  Osmolality  Once,   R        05/28/23 2315   05/28/23 2313  Hepatic function panel  Add-on,   AD       Question:  Release to patient  Answer:  Immediate   05/28/23 2312   05/28/23 2302  Beta-hydroxybutyric acid  Once,   R        05/28/23 2301   05/28/23 2301  Osmolality, urine  Once,   R        05/28/23 2300   05/28/23 2301  Creatinine, urine, random  Once,   R        05/28/23 2300   05/28/23 2301  Sodium, urine, random  Once,   R        05/28/23 2300   Signed and Held  Magnesium  Tomorrow morning,   R        Signed and Held   Signed and Held  Phosphorus  Tomorrow morning,   R        Signed and Held   Signed and Held  Comprehensive metabolic panel  Tomorrow morning,   R       Question:  Release to patient  Answer:  Immediate   Signed and Held   Signed and Held  CBC  Tomorrow morning,   R       Question:  Release to patient  Answer:  Immediate   Signed and Held            Vitals/Pain Today's Vitals   05/28/23 1517 05/28/23 1911 05/28/23 2132 05/28/23 2230  BP:  102/66 (!) 141/69 (!) 104/59  Pulse:  71 85 83  Resp:  16 16 16   Temp:  98.1 F (36.7 C) 97.7 F (36.5 C)   TempSrc:  Oral Oral   SpO2:  98% 98% 98%  Weight:      Height:      PainSc: 0-No pain       Isolation Precautions No active isolations  Medications Medications  magnesium sulfate IVPB 2 g 50 mL (has no administration in time range)  cefTRIAXone (ROCEPHIN) 1 g in sodium chloride 0.9 % 100 mL IVPB (has no administration in time range)  dextrose 50 % solution 50 mL (50 mLs Intravenous Given 05/28/23 2129)  iohexol (OMNIPAQUE) 350 MG/ML injection 80 mL (80 mLs Intravenous Contrast Given 05/28/23 2347)    Mobility walks with person assist

## 2023-05-29 NOTE — Assessment & Plan Note (Signed)
Will replace ?

## 2023-05-29 NOTE — Evaluation (Signed)
Physical Therapy Evaluation-1x Patient Details Name: Jacqueline Ball MRN: [id number] DOB: 12-29-1940 Today's Date: 05/29/2023  History of Present Illness  81 y.o. female with presented with shortness of breath, hypoglycemia. Medical history significant of DM 2, anxiety, A-fib on Eliquis hypertension, dementia.  Clinical Impression  Independent with mobility. O2>90% throughout session. Will sign off.       Recommendations for follow up therapy are one component of a multi-disciplinary discharge planning process, led by the attending physician.  Recommendations may be updated based on patient status, additional functional criteria and insurance authorization.  Follow Up Recommendations       Assistance Recommended at Discharge Frequent or constant Supervision/Assistance  Patient can return home with the following       Equipment Recommendations None recommended by PT  Recommendations for Other Services       Functional Status Assessment       Precautions / Restrictions Precautions Precautions: None Restrictions Weight Bearing Restrictions: No      Mobility  Bed Mobility               General bed mobility comments: oob in recliner    Transfers Overall transfer level: Independent                      Ambulation/Gait Ambulation/Gait assistance: Independent Gait Distance (Feet): 400 Feet Assistive device: None Gait Pattern/deviations: Step-through pattern       General Gait Details: No LOB. No dizziness. No dyspnea. O2 >90% on RA throughout.  Stairs Stairs: Yes Stairs assistance: Modified independent (Device/Increase time) Stair Management: One rail Left, Alternating pattern, Forwards Number of Stairs: 5    Wheelchair Mobility    Modified Rankin (Stroke Patients Only)       Balance Overall balance assessment: Mild deficits observed, not formally tested                                           Pertinent Vitals/Pain Pain  Assessment Pain Assessment: No/denies pain    Home Living Family/patient expects to be discharged to:: Private residence Living Arrangements: Spouse/significant other;Children Available Help at Discharge: Family;Available 24 hours/day Type of Home: House Home Access: Stairs to enter Entrance Stairs-Rails: None;Right Entrance Stairs-Number of Steps: 5 Alternate Level Stairs-Number of Steps: flight Home Layout: Two level;Bed/bath upstairs Home Equipment: Pharmacist, hospital (2 wheels);Cane - single point      Prior Function Prior Level of Function : Independent/Modified Independent;Driving             Mobility Comments: husband helps with cooking; pt does her coooking, cleaning, finanaces adn medication management ADLs Comments: daughter gives shots     Hand Dominance   Dominant Hand: Left    Extremity/Trunk Assessment   Upper Extremity Assessment Upper Extremity Assessment: Overall WFL for tasks assessed (complaining of pain @ thenar area B hands however does not appear to inferfere functionally)    Lower Extremity Assessment Lower Extremity Assessment: Defer to PT evaluation    Cervical / Trunk Assessment Cervical / Trunk Assessment: Normal  Communication   Communication: No difficulties  Cognition Arousal/Alertness: Awake/alert Behavior During Therapy: WFL for tasks assessed/performed Overall Cognitive Status: History of cognitive impairments - at baseline Area of Impairment: Orientation, Memory, Safety/judgement                 Orientation Level: Disoriented to, Place, Time   Memory: Decreased short-term  memory   Safety/Judgement: Decreased awareness of safety              General Comments      Exercises     Assessment/Plan    PT Assessment All further PT needs can be met in the next venue of care (Recommend home safety evaluation)  PT Problem List         PT Treatment Interventions      PT Goals (Current goals can be found  in the Care Plan section)  Acute Rehab PT Goals Patient Stated Goal: home soon PT Goal Formulation: All assessment and education complete, DC therapy    Frequency       Co-evaluation               AM-PAC PT "6 Clicks" Mobility  Outcome Measure Help needed turning from your back to your side while in a flat bed without using bedrails?: None Help needed moving from lying on your back to sitting on the side of a flat bed without using bedrails?: None Help needed moving to and from a bed to a chair (including a wheelchair)?: None Help needed standing up from a chair using your arms (e.g., wheelchair or bedside chair)?: None Help needed to walk in hospital room?: None Help needed climbing 3-5 steps with a railing? : None 6 Click Score: 24    End of Session   Activity Tolerance: Patient tolerated treatment well Patient left: in chair;with call bell/phone within reach        Time: 1350-1404 PT Time Calculation (min) (ACUTE ONLY): 14 min   Charges:   PT Evaluation $PT Eval Low Complexity: 1 Low            Faye Ramsay, PT Acute Rehabilitation  Office: [phone]

## 2023-05-29 NOTE — Progress Notes (Signed)
Initial Nutrition Assessment  DOCUMENTATION CODES:   Non-severe (moderate) malnutrition in context of chronic illness  INTERVENTION:   -Ensure Plus High Protein po BID, each supplement provides 350 kcal and 20 grams of protein.   -Liberalize diet to regular diet given malnutrition  NUTRITION DIAGNOSIS:   Moderate Malnutrition related to chronic illness (dementia) as evidenced by mild fat depletion, mild muscle depletion.  GOAL:   Patient will meet greater than or equal to 90% of their needs  MONITOR:   PO intake, Supplement acceptance, Labs, Weight trends, I & O's  REASON FOR ASSESSMENT:   Consult Assessment of nutrition requirement/status  ASSESSMENT:   82 y.o. female with medical history significant of DM 2, anxiety, A-fib on Eliquis hypertension, dementia. Admitted with SOB.  Patient in room, very pleasant but confused. Pt reports she did eat some breakfast, eggs, banana and sausage. States she ate most of it but was documented by staff she ate 50% of breakfast. Pt not able to tell much detail regarding her eating habits. Denies issues with swallowing or chewing. Will order Ensure supplements and liberalize diet to promote good PO.   Per weight records, no weight loss noted.  Medications: Colace, Senokot, IV Mg sulfate  Labs reviewed: CBGs: 110-204   NUTRITION - FOCUSED PHYSICAL EXAM:  Flowsheet Row Most Recent Value  Orbital Region Mild depletion  Upper Arm Region Moderate depletion  Thoracic and Lumbar Region Unable to assess  Buccal Region Mild depletion  Temple Region Moderate depletion  Clavicle Bone Region Mild depletion  Clavicle and Acromion Bone Region Mild depletion  Scapular Bone Region Mild depletion  Dorsal Hand Mild depletion  Patellar Region Unable to assess  Anterior Thigh Region Unable to assess  Posterior Calf Region Unable to assess  Edema (RD Assessment) None  Hair Reviewed  Eyes Reviewed  Mouth Reviewed  Skin Reviewed        Diet Order:   Diet Order             Diet regular Room service appropriate? No; Fluid consistency: Thin  Diet effective now           Diet Carb Modified                   EDUCATION NEEDS:   Not appropriate for education at this time  Skin:  Skin Assessment: Reviewed RN Assessment  Last BM:  6/11  Height:   Ht Readings from Last 1 Encounters:  05/28/23 5\' 4"  (1.626 m)    Weight:   Wt Readings from Last 1 Encounters:  05/28/23 59 kg   BMI:  Body mass index is 22.31 kg/m.  Estimated Nutritional Needs:   Kcal:  1450-1650  Protein:  70-85g  Fluid:  1.6L/day   Tilda Franco, MS, RD, LDN Inpatient Clinical Dietitian Contact information available via Amion

## 2023-05-29 NOTE — Assessment & Plan Note (Signed)
-   treat with Rocephin         await results of urine culture and adjust antibiotic coverage as needed  

## 2023-05-29 NOTE — Discharge Summary (Signed)
[facility]  Physician Discharge Summary   Patient ID: Jacqueline Ball MRN: [id number] DOB/AGE: [birth year] 82 y.o.  Admit date: 05/28/2023 Discharge date:   05/29/2023   PCP: Wilmer Floor, NP  DISCHARGE DIAGNOSES:    Hypoglycemia   Cognitive impairment   Chronic atrial fibrillation (HCC)   Essential hypertension   Type 2 diabetes mellitus without complication, without long-term current use of insulin (HCC)   GERD (gastroesophageal reflux disease)   Shortness of breath   Hypomagnesemia   UTI (urinary tract infection)   RECOMMENDATIONS FOR OUTPATIENT FOLLOW UP: Patient instructed to follow-up with her PCP within 1 week. Dose of insulin has been decreased due to hypoglycemia.  Home Health: None Equipment/Devices: None  CODE STATUS: Full code  DISCHARGE CONDITION: fair  Diet recommendation: Modified carbohydrate  INITIAL HISTORY: 82 y.o. female with medical history significant of DM 2, anxiety, A-fib on Eliquis hypertension, dementia enters with complaints of shortness of breath.  She was noted to be hypoglycemic with a blood glucose level in the 40s.  CT angiogram chest did not show any acute findings.  She was placed on D5 infusion with improvement in glucose levels and improvement in her symptoms.  HOSPITAL COURSE:   Shortness of breath Patient presented with dyspnea.  Imaging studies were unremarkable.  Her symptoms were thought to be due to hypoglycemia.  She is back to baseline now.  No further workup at this time.  Hypoglycemia in the setting of diabetes mellitus type 2 on insulin HbA1c 7.4.  She presented with glucose levels in the 40s.  She is on Lantus and metformin at home.  Home medication list also showed Amaryl.  Her pharmacy was called and she is not on this medication at this time.  Dose of Lantus has been decreased.  Glucose levels to be monitored at home.  Follow-up with PCP.  Her other medical issues including chronic atrial fibrillation, cognitive  impairment, GERD are all stable. She may continue with her Eliquis.  Urine was noted to be abnormal.  Patient with cognitive impairment.  Denies any dysuria.  Will give her a 5-day course of Keflex.  Magnesium has been corrected.  Patient is stable.  Okay for discharge later today if glucose levels remain stable and if she tolerates her diet.   PERTINENT LABS:  The results of significant diagnostics from this hospitalization (including imaging, microbiology, ancillary and laboratory) are listed below for reference.    Labs:   Basic Metabolic Panel: Recent Labs  Lab 05/28/23 1730 05/28/23 2254 05/29/23 0505  NA 138  --  135  K 4.1  --  3.8  CL 104  --  100  CO2 25  --  26  GLUCOSE 61*  --  201*  BUN 15  --  16  CREATININE 0.81  --  0.82  CALCIUM 9.8  --  9.7  MG  --  1.4* 1.7  PHOS  --  3.8 3.4   Liver Function Tests: Recent Labs  Lab 05/29/23 0505 05/29/23 0507  AST 17 15  ALT 16 16  ALKPHOS 73 72  BILITOT 0.2* 0.3  PROT 6.9 6.8  ALBUMIN 3.5 3.5    CBC: Recent Labs  Lab 05/28/23 1730 05/29/23 0505  WBC 8.2 8.1  HGB 12.2 12.2  HCT 37.4 37.3  MCV 98.7 97.4  PLT 329 319   Cardiac Enzymes: Recent Labs  Lab 05/28/23 2254  CKTOTAL 68     CBG: Recent Labs  Lab 05/29/23 0259 05/29/23 0450 05/29/23 4098  05/29/23 0748 05/29/23 1002  GLUCAP 110* 187* 188* 140* 204*     IMAGING STUDIES CT Angio Chest PE W and/or Wo Contrast  Result Date: 05/29/2023 CLINICAL DATA:  Pulmonary embolism (PE) suspected, high prob. Shortness of breath EXAM: CT ANGIOGRAPHY CHEST WITH CONTRAST TECHNIQUE: Multidetector CT imaging of the chest was performed using the standard protocol during bolus administration of intravenous contrast. Multiplanar CT image reconstructions and MIPs were obtained to evaluate the vascular anatomy. RADIATION DOSE REDUCTION: This exam was performed according to the departmental dose-optimization program which includes automated exposure control,  adjustment of the mA and/or kV according to patient size and/or use of iterative reconstruction technique. CONTRAST:  80mL OMNIPAQUE IOHEXOL 350 MG/ML SOLN COMPARISON:  01/23/2012 FINDINGS: Cardiovascular: No filling defects in the pulmonary arteries to suggest pulmonary emboli. Heart is normal size. Aorta is normal caliber. Scattered coronary artery and aortic calcifications. Mediastinum/Nodes: No mediastinal, hilar, or axillary adenopathy. Trachea and esophagus are unremarkable. Thyroid unremarkable. Lungs/Pleura: No confluent airspace opacities or effusions. Linear areas of atelectasis or scarring in the right lower lobe. Upper Abdomen: No acute findings Musculoskeletal: Chest wall soft tissues are unremarkable. No acute bony abnormality. Review of the MIP images confirms the above findings. IMPRESSION: No evidence of pulmonary embolus. No acute cardiopulmonary disease. Areas of scarring or atelectasis in the right lower lobe. Scattered coronary artery disease. Aortic Atherosclerosis (ICD10-I70.0). Electronically Signed   By: Charlett Nose M.D.   On: 05/29/2023 00:10   DG Chest 2 View  Result Date: 05/28/2023 CLINICAL DATA:  Shortness of breath. EXAM: CHEST - 2 VIEW COMPARISON:  February 11, 2022 FINDINGS: The heart size and mediastinal contours are within normal limits. A small curvilinear surgical suture is seen overlying the medial aspect of the left apex. This is located within the superficial soft tissues of the upper back on the lateral view. Both lungs are clear. The visualized skeletal structures are unremarkable. IMPRESSION: No active cardiopulmonary disease. Electronically Signed   By: Aram Candela M.D.   On: 05/28/2023 18:53     DISCHARGE EXAMINATION: Vitals:   05/29/23 0200 05/29/23 0248 05/29/23 0651 05/29/23 1103  BP:  (!) 144/62 120/72 115/75  Pulse:  (!) 57 65 60  Resp:  16 16 18   Temp: 97.8 F (36.6 C) 98.1 F (36.7 C) 98.4 F (36.9 C) 97.9 F (36.6 C)  TempSrc:  Oral Oral  Oral  SpO2:  99% 100% 100%  Weight:      Height:       General appearance: Awake alert.  In no distress Resp: Clear to auscultation bilaterally.  Normal effort Cardio: S1-S2 is normal regular.  No S3-S4.  No rubs murmurs or bruit GI: Abdomen is soft.  Nontender nondistended.  Bowel sounds are present normal.  No masses organomegaly    DISPOSITION: Home  Discharge Instructions     Call MD for:  difficulty breathing, headache or visual disturbances   Complete by: As directed    Call MD for:  extreme fatigue   Complete by: As directed    Call MD for:  persistant dizziness or light-headedness   Complete by: As directed    Call MD for:  persistant nausea and vomiting   Complete by: As directed    Call MD for:  severe uncontrolled pain   Complete by: As directed    Call MD for:  temperature >100.4   Complete by: As directed    Diet Carb Modified   Complete by: As directed    Discharge  instructions   Complete by: As directed    Please take your medications as prescribed.  Please note that the dose of insulin has been decreased from 20 units to 12 units.  You may continue taking the metformin.  Monitor your glucose levels at home.  Seek attention if your glucose levels stay less than 100 or if they stay greater than 250.  Anything between 100 to 250 should be okay.  You were cared for by a hospitalist during your hospital stay. If you have any questions about your discharge medications or the care you received while you were in the hospital after you are discharged, you can call the unit and asked to speak with the hospitalist on call if the hospitalist that took care of you is not available. Once you are discharged, your primary care physician will handle any further medical issues. Please note that NO REFILLS for any discharge medications will be authorized once you are discharged, as it is imperative that you return to your primary care physician (or establish a relationship with a  primary care physician if you do not have one) for your aftercare needs so that they can reassess your need for medications and monitor your lab values. If you do not have a primary care physician, you can call [phone] for a physician referral.   Increase activity slowly   Complete by: As directed          Allergies as of 05/29/2023       Reactions   Metoprolol Nausea And Vomiting        Medication List     STOP taking these medications    glimepiride 4 MG tablet Commonly known as: AMARYL   metroNIDAZOLE 500 MG tablet Commonly known as: FLAGYL       TAKE these medications    Accu-Chek Guide test strip Generic drug: glucose blood USE AS DIRECTED UP TO THREE TIMES DAILY   Accu-Chek Guide w/Device Kit USE AS DIRECTED UP TO THREE TIMES DAILY   acetaminophen 500 MG tablet Commonly known as: TYLENOL Take 500 mg by mouth every 6 (six) hours as needed for pain.   apixaban 5 MG Tabs tablet Commonly known as: ELIQUIS Take 1 tablet (5 mg total) by mouth 2 (two) times daily.   atenolol 50 MG tablet Commonly known as: TENORMIN Take 50 mg by mouth daily.   atorvastatin 20 MG tablet Commonly known as: LIPITOR Take 20 mg by mouth every evening.   azelastine 0.1 % nasal spray Commonly known as: ASTELIN Place 1 spray into both nostrils as needed for rhinitis or allergies.   cephALEXin 500 MG capsule Commonly known as: KEFLEX Take 1 capsule (500 mg total) by mouth every 8 (eight) hours for 5 days.   cyclobenzaprine 5 MG tablet Commonly known as: FLEXERIL Take 1-2 tablets (5-10 mg total) by mouth at bedtime.   diclofenac Sodium 1 % Gel Commonly known as: Voltaren Apply one application (2g) up to four times daily as needed for pain.   donepezil 10 MG tablet Commonly known as: ARICEPT Take 1 tablet (10 mg total) by mouth at bedtime.   esomeprazole 40 MG capsule Commonly known as: NEXIUM Take 40 mg by mouth daily at 12 noon.   fluconazole 150 MG  tablet Commonly known as: DIFLUCAN   fluticasone 50 MCG/ACT nasal spray Commonly known as: FLONASE Place 1 spray into both nostrils daily. What changed:  when to take this reasons to take this   gabapentin 100 MG  capsule Commonly known as: NEURONTIN Take 100 mg by mouth at bedtime.   insulin glargine 100 UNIT/ML injection Commonly known as: LANTUS Inject 0.12 mLs (12 Units total) into the skin every evening. What changed: how much to take   losartan 25 MG tablet Commonly known as: COZAAR Take 25 mg by mouth daily.   metFORMIN 500 MG tablet Commonly known as: GLUCOPHAGE Take 1 tablet (500 mg total) by mouth 2 (two) times daily with a meal.   mupirocin ointment 2 % Commonly known as: BACTROBAN Apply 1 application. topically 2 (two) times daily. To affected area till better   nystatin cream Commonly known as: MYCOSTATIN Apply to affected area 2 times daily   pantoprazole 20 MG tablet Commonly known as: PROTONIX Take 1 tablet (20 mg total) by mouth daily.   traMADol 50 MG tablet Commonly known as: ULTRAM Take 1 tablet (50 mg total) by mouth 2 (two) times daily as needed for severe pain. for pain          Follow-up Information     Wilmer Floor, NP. Schedule an appointment as soon as possible for a visit .   Why: For re-evaluation Contact information: [address] [place] [address] [phone]                 TOTAL DISCHARGE TIME: 35 minutes  Virginio Isidore Rito Ehrlich  [facility] Pager on [email or web address]  05/29/2023, 11:52 AM

## 2023-05-29 NOTE — Evaluation (Signed)
Occupational Therapy Evaluation Patient Details Name: Jacqueline Ball MRN: [id number] DOB: 03/13/1941 Today's Date: 05/29/2023   History of Present Illness 82 y.o. female with presented with shortness of breath. Medical history significant of DM 2, anxiety, A-fib on Eliquis hypertension, dementia.   Clinical Impression   PTA pt lives at home with her daughter and husband who provide 24/7 S. Pt appears at her baseline regarding ADL tasks and mobility with VSS on RA. Pt with a history of dementia and husband expressed concern  regarding her driving s she has gotten lost, has lost her car for months at a time and has been brought home before by police. Pt scored a 21/28 on the Short Blessed Test of Memory and Concentration, demonstrating significant cognitive impairment. Family aware of cognitive impairment however feel they would greatly benefit from education regarding home safety, medication management ( diabetes coordinator consult as well) and issues related to driving. Discussed with nsg and CM.      Recommendations for follow up therapy are one component of a multi-disciplinary discharge planning process, led by the attending physician.  Recommendations may be updated based on patient status, additional functional criteria and insurance authorization.   Assistance Recommended at Discharge Frequent or constant Supervision/Assistance  Patient can return home with the following Assist for transportation;Direct supervision/assist for medications management;Direct supervision/assist for financial management;Assistance with cooking/housework    Functional Status Assessment  Patient has not had a recent decline in their functional status  Equipment Recommendations  None recommended by OT    Recommendations for Other Services Other (comment) (TOC follow up)     Precautions / Restrictions Precautions Precautions: None      Mobility Bed Mobility Overal bed mobility: Independent                   Transfers Overall transfer level: Independent                        Balance Overall balance assessment: No apparent balance deficits (not formally assessed)                                         ADL either performed or assessed with clinical judgement   ADL Overall ADL's : At baseline                                             Vision Baseline Vision/History:  (none noted) Patient Visual Report:  (ablet o read in distance and up close)       Perception     Praxis      Pertinent Vitals/Pain Pain Assessment Pain Assessment: No/denies pain     Hand Dominance Left   Extremity/Trunk Assessment Upper Extremity Assessment Upper Extremity Assessment: Overall WFL for tasks assessed (complaining of pain @ thenar area B hands however does not appear to inferfere functionally)   Lower Extremity Assessment Lower Extremity Assessment: Defer to PT evaluation   Cervical / Trunk Assessment Cervical / Trunk Assessment: Normal   Communication Communication Communication: No difficulties   Cognition Arousal/Alertness: Awake/alert Behavior During Therapy: WFL for tasks assessed/performed Overall Cognitive Status: History of cognitive impairments - at baseline Area of Impairment: Orientation, Attention, Memory, Safety/judgement, Awareness, Problem solving  Orientation Level: Disoriented to, Place, Time Current Attention Level: Sustained Memory: Decreased short-term memory   Safety/Judgement: Decreased awareness of safety, Decreased awareness of deficits Awareness: Intellectual   General Comments: tangential; scored a 21/28 on the Short Blessed Test, demosntratign significant cognitive impariment     General Comments       Exercises     Shoulder Instructions      Home Living Family/patient expects to be discharged to:: Private residence Living Arrangements: Spouse/significant  other;Children Available Help at Discharge: Family;Available 24 hours/day Type of Home: House Home Access: Stairs to enter [employer] of Steps: 5 Entrance Stairs-Rails: None;Right Home Layout: Two level;Bed/bath upstairs Alternate Level Stairs-Number of Steps: flight Alternate Level Stairs-Rails: Right;Left Bathroom Shower/Tub: Producer, television/film/video: Standard Bathroom Accessibility: Yes How Accessible: Accessible via wheelchair Home Equipment: Pharmacist, hospital (2 wheels);Cane - single point          Prior Functioning/Environment Prior Level of Function : Independent/Modified Independent;Driving             Mobility Comments: husband helps with cooking; pt does her coooking, cleaning, finanaces adn medication management ADLs Comments: daughter gives shots        OT Problem List: Decreased cognition      OT Treatment/Interventions:      OT Goals(Current goals can be found in the care plan section) Acute Rehab OT Goals Patient Stated Goal: to go home OT Goal Formulation: With patient/family  OT Frequency:      Co-evaluation              AM-PAC OT "6 Clicks" Daily Activity     Outcome Measure Help from another person eating meals?: None Help from another person taking care of personal grooming?: None Help from another person toileting, which includes using toliet, bedpan, or urinal?: None Help from another person bathing (including washing, rinsing, drying)?: None Help from another person to put on and taking off regular upper body clothing?: None Help from another person to put on and taking off regular lower body clothing?: None 6 Click Score: 24   End of Session Nurse Communication: Other (comment) (DC concerns)  Activity Tolerance: Patient tolerated treatment well Patient left: in chair;with call bell/phone within reach;with family/visitor present  OT Visit Diagnosis: Other symptoms and signs involving cognitive  function                Time: 2440-1027 OT Time Calculation (min): 29 min Charges:  OT General Charges $OT Visit: 1 Visit OT Evaluation $OT Eval Low Complexity: 1 Low  Luisa Dago, OT/L   Acute OT Clinical Specialist Acute Rehabilitation Services Pager [phone] Office [phone]   [facility] 05/29/2023, 11:58 AM

## 2023-05-29 NOTE — TOC Initial Note (Addendum)
Transition of Care [facility]) - Initial/Assessment Note    Patient Details  Name: Jacqueline Ball MRN: [id number] Date of Birth: 11/05/1941  [facility] CM/SW Contact:    Howell Rucks, RN Phone Number: 05/29/2023, 9:08 AM  Clinical Narrative:  [facility] consult received for HH/DME needs. PT eval pending ,await recommendation. TOC will continue to follow.     -1:11pm Teams chat received from OT, expressing concern for pt's home safety , states  daughter apparently does her meds but doesn't seem mentally stable. Not sure about husband either, maybe a son  who also helps with med management?. Major concern is safety - she drives and has gotten lost, been brought home by the police. Recommending HHRN, TOC for  resources for dementia/home safety tips. Home Safety Tips added to AVS. NCM will provide contact information to family for [employer]. PT eval pending, await recommendation. TOC will continue to follow.   -2:42pm PT recommendation for Home Safety Evaluation. NCM request attending to enter [facility] PT/OT/RN/CSW.    -3:20pm [facility] PT/OT/RN/CSW. NCM outreached to pt's spouse and dtr [name]) to confirm transportation, no answer, vm left requesting call back. Floor nurse reports pt's family stated they will pick pt up between 5 and 6pm. TOC will continue to follow.         Patient Goals and CMS Choice            Expected Discharge Plan and Services                                              Prior Living Arrangements/Services                       Activities of Daily Living Home Assistive Devices/Equipment: Dan Humphreys (specify type) ADL Screening (condition at time of admission) Patient's cognitive ability adequate to safely complete daily activities?: Yes Is the patient deaf or have difficulty hearing?: No Does the patient have difficulty seeing, even when wearing glasses/contacts?: No Does the patient have difficulty  concentrating, remembering, or making decisions?: Yes Patient able to express need for assistance with ADLs?: No Does the patient have difficulty dressing or bathing?: No Independently performs ADLs?: Yes (appropriate for developmental age) Does the patient have difficulty walking or climbing stairs?: No Weakness of Legs: None Weakness of Arms/Hands: None  Permission Sought/Granted                  Emotional Assessment              Admission diagnosis:  Shortness of breath [R06.02] Hypoglycemia [E16.2] Patient Active Problem List   Diagnosis Date Noted   Hypomagnesemia 05/29/2023   UTI (urinary tract infection) 05/29/2023   Hypoglycemia 05/28/2023   Shortness of breath 05/28/2023   Acute midline low back pain without sciatica 11/29/2019   GERD (gastroesophageal reflux disease) 05/12/2019   Arthritis 05/12/2019   Dementia (HCC) 05/12/2019   Hyperlipidemia 05/12/2019   Overactive bladder 05/12/2019   Chronic anticoagulation 03/18/2018   Late onset Alzheimer's disease without behavioral disturbance (HCC) 01/21/2017   Chronic atrial fibrillation (HCC) 11/02/2015   Essential hypertension 11/02/2015   Type 2 diabetes mellitus without complication, without long-term current use of insulin (HCC) 11/02/2015   Cognitive impairment 10/31/2015   Chest pain 11/22/2014   Muscle cramps 04/17/2012   Bilateral shoulder pain 03/09/2012  Essential hypertension 03/28/2011   Diabetes mellitus type 2, noninsulin dependent (HCC) 03/28/2011   Atypical anxiety disorder 03/28/2011   PCP:  Wilmer Floor, NP Pharmacy:   [facility] - [place], [place] - [address] [place] [address] Phone: [phone] Fax: [phone]     Social Determinants of Health (SDOH) Social History: SDOH Screenings   Food Insecurity: No Food Insecurity (05/29/2023)  Housing: Low Risk  (05/29/2023)  Transportation Needs: No Transportation Needs (05/29/2023)   Utilities: Not At Risk (05/29/2023)  Tobacco Use: Low Risk  (05/28/2023)   SDOH Interventions:     Readmission Risk Interventions     No data to display

## 2023-05-30 LAB — URINE CULTURE: Culture: >=100000 — AB

## 2023-05-30 LAB — PROTEIN C ACTIVITY: Protein C Activity: 143 % (ref 73–180)

## 2023-05-31 LAB — PROTEIN C, TOTAL: Protein C, Total: 115 % (ref 60–150)

## 2023-06-04 DIAGNOSIS — E440 Moderate protein-calorie malnutrition: Secondary | ICD-10-CM | POA: Diagnosis not present

## 2023-06-04 DIAGNOSIS — I700 Atherosclerosis of aorta: Secondary | ICD-10-CM | POA: Diagnosis not present

## 2023-06-04 DIAGNOSIS — Z794 Long term (current) use of insulin: Secondary | ICD-10-CM | POA: Diagnosis not present

## 2023-06-04 DIAGNOSIS — Z556 Problems related to health literacy: Secondary | ICD-10-CM | POA: Diagnosis not present

## 2023-06-04 DIAGNOSIS — Z7984 Long term (current) use of oral hypoglycemic drugs: Secondary | ICD-10-CM | POA: Diagnosis not present

## 2023-06-04 DIAGNOSIS — Z9181 History of falling: Secondary | ICD-10-CM | POA: Diagnosis not present

## 2023-06-04 DIAGNOSIS — N390 Urinary tract infection, site not specified: Secondary | ICD-10-CM | POA: Diagnosis not present

## 2023-06-04 DIAGNOSIS — M1990 Unspecified osteoarthritis, unspecified site: Secondary | ICD-10-CM | POA: Diagnosis not present

## 2023-06-04 DIAGNOSIS — K219 Gastro-esophageal reflux disease without esophagitis: Secondary | ICD-10-CM | POA: Diagnosis not present

## 2023-06-04 DIAGNOSIS — Z7901 Long term (current) use of anticoagulants: Secondary | ICD-10-CM | POA: Diagnosis not present

## 2023-06-04 DIAGNOSIS — I10 Essential (primary) hypertension: Secondary | ICD-10-CM | POA: Diagnosis not present

## 2023-06-04 DIAGNOSIS — E11649 Type 2 diabetes mellitus with hypoglycemia without coma: Secondary | ICD-10-CM | POA: Diagnosis not present

## 2023-06-04 DIAGNOSIS — G301 Alzheimer's disease with late onset: Secondary | ICD-10-CM | POA: Diagnosis not present

## 2023-06-04 DIAGNOSIS — F0284 Dementia in other diseases classified elsewhere, unspecified severity, with anxiety: Secondary | ICD-10-CM | POA: Diagnosis not present

## 2023-06-04 DIAGNOSIS — I482 Chronic atrial fibrillation, unspecified: Secondary | ICD-10-CM | POA: Diagnosis not present

## 2023-06-05 ENCOUNTER — Telehealth: Payer: Self-pay | Admitting: Podiatry

## 2023-06-05 NOTE — Telephone Encounter (Signed)
Jacqueline Ball with [facility] called for two things. The first is she is requesting a delay in care until 06/11/23 as she and the office have been unsuccessful the past week in contacting the patient and her emergency contact. Also stated she got a message from after hours where Dr. Ralene Cork put in a order for wanting the dressing changed with betadine 3 times a week and that is not a skilled visit, nor is it covered by insurance.

## 2023-06-05 NOTE — Telephone Encounter (Signed)
Left message for Jacqueline Ball to let her Dr. Ralene Cork is okay with the delay and is fine if unable for the care. Told Dana to call us back with any other questions and/or concerns.

## 2023-06-06 DIAGNOSIS — Z7901 Long term (current) use of anticoagulants: Secondary | ICD-10-CM | POA: Diagnosis not present

## 2023-06-06 DIAGNOSIS — Z7984 Long term (current) use of oral hypoglycemic drugs: Secondary | ICD-10-CM | POA: Diagnosis not present

## 2023-06-06 DIAGNOSIS — E11649 Type 2 diabetes mellitus with hypoglycemia without coma: Secondary | ICD-10-CM | POA: Diagnosis not present

## 2023-06-06 DIAGNOSIS — K219 Gastro-esophageal reflux disease without esophagitis: Secondary | ICD-10-CM | POA: Diagnosis not present

## 2023-06-06 DIAGNOSIS — M1990 Unspecified osteoarthritis, unspecified site: Secondary | ICD-10-CM | POA: Diagnosis not present

## 2023-06-06 DIAGNOSIS — F0284 Dementia in other diseases classified elsewhere, unspecified severity, with anxiety: Secondary | ICD-10-CM | POA: Diagnosis not present

## 2023-06-06 DIAGNOSIS — Z9181 History of falling: Secondary | ICD-10-CM | POA: Diagnosis not present

## 2023-06-06 DIAGNOSIS — Z794 Long term (current) use of insulin: Secondary | ICD-10-CM | POA: Diagnosis not present

## 2023-06-06 DIAGNOSIS — E440 Moderate protein-calorie malnutrition: Secondary | ICD-10-CM | POA: Diagnosis not present

## 2023-06-06 DIAGNOSIS — G301 Alzheimer's disease with late onset: Secondary | ICD-10-CM | POA: Diagnosis not present

## 2023-06-06 DIAGNOSIS — N390 Urinary tract infection, site not specified: Secondary | ICD-10-CM | POA: Diagnosis not present

## 2023-06-06 DIAGNOSIS — Z556 Problems related to health literacy: Secondary | ICD-10-CM | POA: Diagnosis not present

## 2023-06-06 DIAGNOSIS — I10 Essential (primary) hypertension: Secondary | ICD-10-CM | POA: Diagnosis not present

## 2023-06-06 DIAGNOSIS — I700 Atherosclerosis of aorta: Secondary | ICD-10-CM | POA: Diagnosis not present

## 2023-06-06 DIAGNOSIS — I482 Chronic atrial fibrillation, unspecified: Secondary | ICD-10-CM | POA: Diagnosis not present

## 2023-06-07 DIAGNOSIS — N390 Urinary tract infection, site not specified: Secondary | ICD-10-CM | POA: Diagnosis not present

## 2023-06-07 DIAGNOSIS — Z7984 Long term (current) use of oral hypoglycemic drugs: Secondary | ICD-10-CM | POA: Diagnosis not present

## 2023-06-07 DIAGNOSIS — I482 Chronic atrial fibrillation, unspecified: Secondary | ICD-10-CM | POA: Diagnosis not present

## 2023-06-07 DIAGNOSIS — G301 Alzheimer's disease with late onset: Secondary | ICD-10-CM | POA: Diagnosis not present

## 2023-06-07 DIAGNOSIS — Z7901 Long term (current) use of anticoagulants: Secondary | ICD-10-CM | POA: Diagnosis not present

## 2023-06-07 DIAGNOSIS — Z794 Long term (current) use of insulin: Secondary | ICD-10-CM | POA: Diagnosis not present

## 2023-06-07 DIAGNOSIS — I700 Atherosclerosis of aorta: Secondary | ICD-10-CM | POA: Diagnosis not present

## 2023-06-07 DIAGNOSIS — I10 Essential (primary) hypertension: Secondary | ICD-10-CM | POA: Diagnosis not present

## 2023-06-07 DIAGNOSIS — E440 Moderate protein-calorie malnutrition: Secondary | ICD-10-CM | POA: Diagnosis not present

## 2023-06-07 DIAGNOSIS — Z556 Problems related to health literacy: Secondary | ICD-10-CM | POA: Diagnosis not present

## 2023-06-07 DIAGNOSIS — M1990 Unspecified osteoarthritis, unspecified site: Secondary | ICD-10-CM | POA: Diagnosis not present

## 2023-06-07 DIAGNOSIS — F0284 Dementia in other diseases classified elsewhere, unspecified severity, with anxiety: Secondary | ICD-10-CM | POA: Diagnosis not present

## 2023-06-07 DIAGNOSIS — K219 Gastro-esophageal reflux disease without esophagitis: Secondary | ICD-10-CM | POA: Diagnosis not present

## 2023-06-07 DIAGNOSIS — Z9181 History of falling: Secondary | ICD-10-CM | POA: Diagnosis not present

## 2023-06-07 DIAGNOSIS — E11649 Type 2 diabetes mellitus with hypoglycemia without coma: Secondary | ICD-10-CM | POA: Diagnosis not present

## 2023-06-13 ENCOUNTER — Telehealth: Payer: Self-pay | Admitting: *Deleted

## 2023-06-13 DIAGNOSIS — E440 Moderate protein-calorie malnutrition: Secondary | ICD-10-CM | POA: Diagnosis not present

## 2023-06-13 DIAGNOSIS — N390 Urinary tract infection, site not specified: Secondary | ICD-10-CM | POA: Diagnosis not present

## 2023-06-13 DIAGNOSIS — I10 Essential (primary) hypertension: Secondary | ICD-10-CM | POA: Diagnosis not present

## 2023-06-13 DIAGNOSIS — I700 Atherosclerosis of aorta: Secondary | ICD-10-CM | POA: Diagnosis not present

## 2023-06-13 DIAGNOSIS — Z7901 Long term (current) use of anticoagulants: Secondary | ICD-10-CM | POA: Diagnosis not present

## 2023-06-13 DIAGNOSIS — Z794 Long term (current) use of insulin: Secondary | ICD-10-CM | POA: Diagnosis not present

## 2023-06-13 DIAGNOSIS — I482 Chronic atrial fibrillation, unspecified: Secondary | ICD-10-CM | POA: Diagnosis not present

## 2023-06-13 DIAGNOSIS — Z9181 History of falling: Secondary | ICD-10-CM | POA: Diagnosis not present

## 2023-06-13 DIAGNOSIS — M1990 Unspecified osteoarthritis, unspecified site: Secondary | ICD-10-CM | POA: Diagnosis not present

## 2023-06-13 DIAGNOSIS — K219 Gastro-esophageal reflux disease without esophagitis: Secondary | ICD-10-CM | POA: Diagnosis not present

## 2023-06-13 DIAGNOSIS — Z556 Problems related to health literacy: Secondary | ICD-10-CM | POA: Diagnosis not present

## 2023-06-13 DIAGNOSIS — G301 Alzheimer's disease with late onset: Secondary | ICD-10-CM | POA: Diagnosis not present

## 2023-06-13 DIAGNOSIS — E11649 Type 2 diabetes mellitus with hypoglycemia without coma: Secondary | ICD-10-CM | POA: Diagnosis not present

## 2023-06-13 DIAGNOSIS — Z7984 Long term (current) use of oral hypoglycemic drugs: Secondary | ICD-10-CM | POA: Diagnosis not present

## 2023-06-13 DIAGNOSIS — F0284 Dementia in other diseases classified elsewhere, unspecified severity, with anxiety: Secondary | ICD-10-CM | POA: Diagnosis not present

## 2023-06-13 NOTE — Telephone Encounter (Signed)
[facility] is wanting to update the physician that the home health services will start on 06/13/23.

## 2023-06-14 ENCOUNTER — Emergency Department (HOSPITAL_COMMUNITY)
Admission: EM | Admit: 2023-06-14 | Discharge: 2023-06-15 | Disposition: A | Discharge status: Home / Self Care | Payer: Medicare HMO | Attending: Emergency Medicine | Admitting: Emergency Medicine

## 2023-06-14 ENCOUNTER — Encounter (HOSPITAL_COMMUNITY): Payer: Self-pay | Admitting: Emergency Medicine

## 2023-06-14 ENCOUNTER — Other Ambulatory Visit: Payer: Self-pay

## 2023-06-14 DIAGNOSIS — R42 Dizziness and giddiness: Secondary | ICD-10-CM | POA: Insufficient documentation

## 2023-06-14 DIAGNOSIS — I10 Essential (primary) hypertension: Secondary | ICD-10-CM | POA: Insufficient documentation

## 2023-06-14 DIAGNOSIS — Z79899 Other long term (current) drug therapy: Secondary | ICD-10-CM | POA: Diagnosis not present

## 2023-06-14 DIAGNOSIS — E1165 Type 2 diabetes mellitus with hyperglycemia: Secondary | ICD-10-CM | POA: Diagnosis not present

## 2023-06-14 DIAGNOSIS — F0390 Unspecified dementia without behavioral disturbance: Secondary | ICD-10-CM | POA: Insufficient documentation

## 2023-06-14 DIAGNOSIS — Z7901 Long term (current) use of anticoagulants: Secondary | ICD-10-CM | POA: Diagnosis not present

## 2023-06-14 DIAGNOSIS — R110 Nausea: Secondary | ICD-10-CM | POA: Insufficient documentation

## 2023-06-14 DIAGNOSIS — E11649 Type 2 diabetes mellitus with hypoglycemia without coma: Secondary | ICD-10-CM | POA: Diagnosis not present

## 2023-06-14 LAB — BASIC METABOLIC PANEL
Anion gap: 10 (ref 5–15)
BUN: 18 mg/dL (ref 8–23)
CO2: 21 mmol/L — ABNORMAL LOW (ref 22–32)
Calcium: 9.2 mg/dL (ref 8.9–10.3)
Chloride: 102 mmol/L (ref 98–111)
Creatinine, Ser: 0.9 mg/dL (ref 0.44–1.00)
GFR, Estimated: >60 mL/min (ref >60)
Glucose, Bld: 207 mg/dL — ABNORMAL HIGH (ref 70–99)
Potassium: 3.5 mmol/L (ref 3.5–5.1)
Sodium: 133 mmol/L — ABNORMAL LOW (ref 135–145)

## 2023-06-14 LAB — URINALYSIS, ROUTINE W REFLEX MICROSCOPIC
Bilirubin Urine: NEGATIVE
Glucose, UA: NEGATIVE mg/dL
Hgb urine dipstick: NEGATIVE
Ketones, ur: NEGATIVE mg/dL
Nitrite: NEGATIVE
Protein, ur: NEGATIVE mg/dL
Specific Gravity, Urine: >1.03 — ABNORMAL HIGH (ref 1.005–1.030)
pH: 5.5 (ref 5.0–8.0)

## 2023-06-14 LAB — CBC
HCT: 36.8 % (ref 36.0–46.0)
Hemoglobin: 12.2 g/dL (ref 12.0–15.0)
MCH: 32.1 pg (ref 26.0–34.0)
MCHC: 33.2 g/dL (ref 30.0–36.0)
MCV: 96.8 fL (ref 80.0–100.0)
Platelets: 326 10*3/uL (ref 150–400)
RBC: 3.8 MIL/uL — ABNORMAL LOW (ref 3.87–5.11)
RDW: 12.8 % (ref 11.5–15.5)
WBC: 9 10*3/uL (ref 4.0–10.5)
nRBC: 0 % (ref 0.0–0.2)

## 2023-06-14 LAB — URINALYSIS, MICROSCOPIC (REFLEX)
Bacteria, UA: NONE SEEN
RBC / HPF: NONE SEEN RBC/hpf (ref 0–5)

## 2023-06-14 LAB — CBG MONITORING, ED: Glucose-Capillary: 216 mg/dL — ABNORMAL HIGH (ref 70–99)

## 2023-06-14 NOTE — ED Triage Notes (Signed)
Pt to ED from home c/o dizziness and shakiness for a couple days.  States eating and drinking okay at home.  Denies pain or SOB.  Unsure if falls at home recently.  Also c/o nausea without vomiting, denies urinary changes.  Pt A&Ox4.

## 2023-06-14 NOTE — ED Provider Notes (Signed)
[place] EMERGENCY DEPARTMENT AT [facility] Provider Note  CSN: [id number] Arrival date & time: 06/14/23 1944  Chief Complaint(s) Dizziness  HPI Jacqueline Ball is a 82 y.o. female here for dizziness. Onset 2 to 3 days. Patient reports URI symptoms/nasal congestion 1 to 2 weeks ago.  Reports that since then things just do not taste right.  Because of that she has had decreased p.o. intake.  She denies any known fevers.  Reports nausea without emesis.  Denies any abdominal pain.  Dizziness is usually when she is up and about and moving.  She denies any focal deficits.  No visual disturbance.  No bloody bowel movements.  No chest pain or shortness of breath.  No other physical complaints.   Dizziness   Past Medical History Past Medical History:  Diagnosis Date   Anxiety    Atrial fibrillation (HCC)    Diabetes mellitus    Hyperlipidemia    Hypertension    Memory loss    Mood disorder (HCC)    Physical exam, annual 10/22/06   Renal cyst    Patient Active Problem List   Diagnosis Date Noted   Hypomagnesemia 05/29/2023   UTI (urinary tract infection) 05/29/2023   Malnutrition of moderate degree 05/29/2023   Hypoglycemia 05/28/2023   Shortness of breath 05/28/2023   Acute midline low back pain without sciatica 11/29/2019   GERD (gastroesophageal reflux disease) 05/12/2019   Arthritis 05/12/2019   Dementia (HCC) 05/12/2019   Hyperlipidemia 05/12/2019   Overactive bladder 05/12/2019   Chronic anticoagulation 03/18/2018   Late onset Alzheimer's disease without behavioral disturbance (HCC) 01/21/2017   Chronic atrial fibrillation (HCC) 11/02/2015   Essential hypertension 11/02/2015   Type 2 diabetes mellitus without complication, without long-term current use of insulin (HCC) 11/02/2015   Cognitive impairment 10/31/2015   Chest pain 11/22/2014   Muscle cramps 04/17/2012   Bilateral shoulder pain 03/09/2012   Essential hypertension 03/28/2011   Diabetes mellitus  type 2, noninsulin dependent (HCC) 03/28/2011   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 Authorizing Provider  ACCU-CHEK GUIDE test strip USE AS DIRECTED UP TO THREE TIMES DAILY 01/16/21   [provider]  acetaminophen (TYLENOL) 500 MG tablet Take 500 mg by mouth every 6 (six) hours as needed for pain.    [provider]  apixaban (ELIQUIS) 5 MG TABS tablet Take 1 tablet (5 mg total) by mouth 2 (two) times daily. 11/24/14   Rinaldo Cloud, MD  atenolol (TENORMIN) 50 MG tablet Take 50 mg by mouth daily. 07/29/19   [provider]  atorvastatin (LIPITOR) 20 MG tablet Take 20 mg by mouth every evening.    [provider]  azelastine (ASTELIN) 0.1 % nasal spray Place 1 spray into both nostrils as needed for rhinitis or allergies.  05/24/18   [provider]  Blood Glucose Monitoring Suppl (ACCU-CHEK GUIDE) w/Device KIT USE AS DIRECTED UP TO THREE TIMES DAILY 01/16/21   [provider]  cyclobenzaprine (FLEXERIL) 5 MG tablet Take 1-2 tablets (5-10 mg total) by mouth at bedtime. 06/06/20   Wieters, Hallie C, PA-C  diclofenac Sodium (VOLTAREN) 1 % GEL Apply one application (2g) up to four times daily as needed for pain. 05/19/23   Nira Conn, MD  donepezil (ARICEPT) 10 MG tablet Take 1 tablet (10 mg total) by mouth at bedtime. 06/06/17   Marvel Plan, MD  esomeprazole (NEXIUM) 40 MG capsule Take  40 mg by mouth daily at 12 noon.    [provider]  fluconazole (DIFLUCAN) 150 MG tablet     [provider]  fluticasone (FLONASE) 50 MCG/ACT nasal spray Place 1 spray into both nostrils daily. Patient taking differently: Place 1 spray into both nostrils daily as needed for allergies. 09/15/18   Dahlia Byes A, NP  gabapentin (NEURONTIN) 100 MG capsule Take 100 mg by mouth at bedtime.    [provider]  insulin glargine (LANTUS) 100  UNIT/ML injection Inject 0.12 mLs (12 Units total) into the skin every evening. 05/29/23   Osvaldo Shipper, MD  losartan (COZAAR) 25 MG tablet Take 25 mg by mouth daily. 05/03/20   [provider]  metFORMIN (GLUCOPHAGE) 500 MG tablet Take 1 tablet (500 mg total) by mouth 2 (two) times daily with a meal. 06/29/22   Charlynne Pander, MD  mupirocin ointment (BACTROBAN) 2 % Apply 1 application. topically 2 (two) times daily. To affected area till better 05/09/22   Zenia Resides, MD  nystatin cream (MYCOSTATIN) Apply to affected area 2 times daily 04/02/20   Dahlia Byes A, NP  pantoprazole (PROTONIX) 20 MG tablet Take 1 tablet (20 mg total) by mouth daily. 02/24/20   Coralyn Mark, NP  traMADol (ULTRAM) 50 MG tablet Take 1 tablet (50 mg total) by mouth 2 (two) times daily as needed for severe pain. for pain 11/23/21   Antony Madura, PA-C                                                                                                                                    Allergies Metoprolol  Review of Systems Review of Systems  Neurological:  Positive for dizziness.   As noted in HPI  Physical Exam Vital Signs  I have reviewed the triage vital signs BP 126/76 (BP Location: Left Arm)   Pulse 69   Temp 97.7 F (36.5 C) (Oral)   Resp 18   Ht 5\' 4"  (1.626 m)   Wt 63.5 kg   SpO2 100%   BMI 24.03 kg/m   Physical Exam Vitals reviewed.  Constitutional:      General: She is not in acute distress.    Appearance: She is well-developed. She is not diaphoretic.  HENT:     Head: Normocephalic and atraumatic.     Nose: Nose normal.  Eyes:     General: No scleral icterus.       Right eye: No discharge.        Left eye: No discharge.     Conjunctiva/sclera: Conjunctivae normal.     Pupils: Pupils are equal, round, and reactive to light.  Cardiovascular:     Rate and Rhythm: Normal rate and regular rhythm.     Heart sounds: No murmur heard.    No friction rub. No gallop.   Pulmonary:     Effort: Pulmonary effort is normal. No  respiratory distress.     Breath sounds: Normal breath sounds. No stridor. No rales.  Abdominal:     General: There is no distension.     Palpations: Abdomen is soft.     Tenderness: There is no abdominal tenderness.  Musculoskeletal:        General: No tenderness.     Cervical back: Normal range of motion and neck supple.  Skin:    General: Skin is warm and dry.     Findings: No erythema or rash.  Neurological:     Comments: Mental Status:  Alert and oriented to person, place, and time.  Attention and concentration normal.  Speech clear.   Cranial Nerves:  II Visual Fields: Intact to confrontation. Visual fields intact. III, IV, VI: Pupils equal and reactive to light and near. Full eye movement without nystagmus  V Facial Sensation: Normal. No weakness of masticatory muscles  VII: No facial weakness or asymmetry  VIII Auditory Acuity: Grossly normal  IX/X: The uvula is midline; the palate elevates symmetrically  XI: Normal sternocleidomastoid and trapezius strength  XII: The tongue is midline. No atrophy or fasciculations.   Motor System: Muscle Strength: 5/5 and symmetric in the upper and lower extremities. No pronation or drift.  Muscle Tone: Tone and muscle bulk are normal in the upper and lower extremities.  Reflexes: DTRs: 1+ and symmetrical in all four extremities. No Clonus Coordination: Intact finger-to-nose, heel-to-shin. No tremor.  Sensation: Intact to light touch..  Gait: Routine gait normal.      ED Results and Treatments Labs (all labs ordered are listed, but only abnormal results are displayed) Labs Reviewed  BASIC METABOLIC PANEL - Abnormal; Notable for the following components:      Result Value   Sodium 133 (*)    CO2 21 (*)    Glucose, Bld 207 (*)    All other components within normal limits  CBC - Abnormal; Notable for the following components:   RBC 3.80 (*)    All other components within  normal limits  URINALYSIS, ROUTINE W REFLEX MICROSCOPIC - Abnormal; Notable for the following components:   Specific Gravity, Urine >1.030 (*)    Leukocytes,Ua SMALL (*)    All other components within normal limits  CBG MONITORING, ED - Abnormal; Notable for the following components:   Glucose-Capillary 216 (*)    All other components within normal limits  URINALYSIS, MICROSCOPIC (REFLEX)                                                                                                                         EKG  EKG Interpretation Date/Time:  Friday June 14 2023 20:05:42 EDT Ventricular Rate:  68 PR Interval:  159 QRS Duration:  92 QT Interval:  397 QTC Calculation: 423 R Axis:   11  Text Interpretation: Sinus rhythm No significant change since last tracing Confirmed by Gwyneth Sprout ([id number]) on 06/14/2023 9:54:12 PM       Radiology No results found.  Medications Ordered in ED Medications - No data to display Procedures Procedures  (including critical care time) Medical Decision Making / ED Course   Medical Decision Making Amount and/or Complexity of Data Reviewed Labs: ordered.    Patient presents for lightheadedness the setting of recent URI symptoms and decreased p.o. intake.  On review of records, patient was admitted 2 weeks ago for shortness of breath and lightheadedness found to be hyperglycemic required admission.  Differential includes but not limited to anemia, electrolyte derangements, hypoglycemia, metabolic derangements, renal insufficiency, urinary tract infection, dysrhythmia.  No focal deficits noted on exam concerning for CVA.  Ambulated well without assistance.  EKG without acute ischemic changes, dysrhythmias or blocks. CBC without leukocytosis or anemia. Metabolic panel without significant electrolyte derangements or renal sufficiency.  Mild hyperglycemia without DKA. UA not concerning for infection.    Final Clinical Impression(s) / ED  Diagnoses Final diagnoses:  Lightheadedness    The patient appears reasonably screened and/or stabilized for discharge and I doubt any other medical condition or other [facility] requiring further screening, evaluation, or treatment in the ED at this time. I have discussed the findings, Dx and Tx plan with the patient/family who expressed understanding and agree(s) with the plan. Discharge instructions discussed at length. The patient/family was given strict return precautions who verbalized understanding of the instructions. No further questions at time of discharge.  Disposition: Discharge  Condition: Good  ED Discharge Orders     None       Follow Up: Wilmer Floor, NP [address] [place] [address] [phone]  Call  to schedule an appointment for close follow up     This chart was dictated using voice recognition software.  Despite best efforts to proofread,  errors can occur which can change the documentation meaning.    Nira Conn, MD 06/14/23 [phone]

## 2023-06-15 NOTE — ED Notes (Signed)
Pt family member kimera Wierman notified by phone from this nurse that pt is up for DC, just needs a ride home, as this family member called earlier tonight to notify staff it is unsafe for pt to drive herself home. Family member states they are unable to come and get her from the ER until in the morning. Charge Nurse and pt updated on this information. Pt reports she drove herself here tonight and has her car out front. Pt redirected to not drive herself home at this time.

## 2023-06-15 NOTE — ED Notes (Signed)
Spoke to son about arranging transportation for pt. Back home. Pt.'s son states that he will be at the ED in about 30 mins to pick patient up.

## 2023-06-16 ENCOUNTER — Observation Stay (HOSPITAL_COMMUNITY)
Admission: EM | Admit: 2023-06-16 | Discharge: 2023-06-17 | Disposition: A | Discharge status: Home / Self Care | Payer: Medicare HMO | Attending: Family Medicine | Admitting: Family Medicine

## 2023-06-16 ENCOUNTER — Emergency Department (HOSPITAL_COMMUNITY): Payer: Medicare HMO

## 2023-06-16 ENCOUNTER — Encounter (HOSPITAL_COMMUNITY): Payer: Self-pay

## 2023-06-16 ENCOUNTER — Other Ambulatory Visit: Payer: Self-pay

## 2023-06-16 DIAGNOSIS — Z79899 Other long term (current) drug therapy: Secondary | ICD-10-CM | POA: Insufficient documentation

## 2023-06-16 DIAGNOSIS — Z7901 Long term (current) use of anticoagulants: Secondary | ICD-10-CM | POA: Insufficient documentation

## 2023-06-16 DIAGNOSIS — R42 Dizziness and giddiness: Principal | ICD-10-CM | POA: Insufficient documentation

## 2023-06-16 DIAGNOSIS — I482 Chronic atrial fibrillation, unspecified: Secondary | ICD-10-CM | POA: Diagnosis not present

## 2023-06-16 DIAGNOSIS — E872 Acidosis, unspecified: Secondary | ICD-10-CM

## 2023-06-16 DIAGNOSIS — E119 Type 2 diabetes mellitus without complications: Secondary | ICD-10-CM | POA: Diagnosis not present

## 2023-06-16 DIAGNOSIS — Z7984 Long term (current) use of oral hypoglycemic drugs: Secondary | ICD-10-CM | POA: Diagnosis not present

## 2023-06-16 DIAGNOSIS — I10 Essential (primary) hypertension: Secondary | ICD-10-CM | POA: Insufficient documentation

## 2023-06-16 DIAGNOSIS — I959 Hypotension, unspecified: Secondary | ICD-10-CM | POA: Diagnosis not present

## 2023-06-16 DIAGNOSIS — Z794 Long term (current) use of insulin: Secondary | ICD-10-CM | POA: Insufficient documentation

## 2023-06-16 DIAGNOSIS — R531 Weakness: Secondary | ICD-10-CM | POA: Diagnosis not present

## 2023-06-16 DIAGNOSIS — F0390 Unspecified dementia without behavioral disturbance: Secondary | ICD-10-CM | POA: Insufficient documentation

## 2023-06-16 DIAGNOSIS — E11649 Type 2 diabetes mellitus with hypoglycemia without coma: Secondary | ICD-10-CM | POA: Diagnosis not present

## 2023-06-16 LAB — COMPREHENSIVE METABOLIC PANEL
ALT: 16 U/L (ref 0–44)
AST: 19 U/L (ref 15–41)
Albumin: 4.1 g/dL (ref 3.5–5.0)
Alkaline Phosphatase: 63 U/L (ref 38–126)
Anion gap: 8 (ref 5–15)
BUN: 13 mg/dL (ref 8–23)
CO2: 24 mmol/L (ref 22–32)
Calcium: 9.1 mg/dL (ref 8.9–10.3)
Chloride: 104 mmol/L (ref 98–111)
Creatinine, Ser: 0.8 mg/dL (ref 0.44–1.00)
GFR, Estimated: >60 mL/min (ref >60)
Glucose, Bld: 126 mg/dL — ABNORMAL HIGH (ref 70–99)
Potassium: 3.8 mmol/L (ref 3.5–5.1)
Sodium: 136 mmol/L (ref 135–145)
Total Bilirubin: 0.7 mg/dL (ref 0.3–1.2)
Total Protein: 7.4 g/dL (ref 6.5–8.1)

## 2023-06-16 LAB — LACTIC ACID, PLASMA
Lactic Acid, Venous: 2.9 mmol/L (ref 0.5–1.9)
Lactic Acid, Venous: 3.7 mmol/L (ref 0.5–1.9)
Lactic Acid, Venous: 3.8 mmol/L (ref 0.5–1.9)
Lactic Acid, Venous: 2.8 mmol/L (ref 0.5–1.9)

## 2023-06-16 LAB — URINALYSIS, W/ REFLEX TO CULTURE (INFECTION SUSPECTED)
Bilirubin Urine: NEGATIVE
Glucose, UA: NEGATIVE mg/dL
Hgb urine dipstick: NEGATIVE
Ketones, ur: NEGATIVE mg/dL
Leukocytes,Ua: NEGATIVE
Nitrite: NEGATIVE
Protein, ur: 30 mg/dL — AB
Specific Gravity, Urine: 1.005 (ref 1.005–1.030)
pH: 6 (ref 5.0–8.0)

## 2023-06-16 LAB — CBC WITH DIFFERENTIAL/PLATELET
Abs Immature Granulocytes: 0.01 10*3/uL (ref 0.00–0.07)
Basophils Absolute: 0 10*3/uL (ref 0.0–0.1)
Basophils Relative: 1 %
Eosinophils Absolute: 0 10*3/uL (ref 0.0–0.5)
Eosinophils Relative: 0 %
HCT: 37 % (ref 36.0–46.0)
Hemoglobin: 12.1 g/dL (ref 12.0–15.0)
Immature Granulocytes: 0 %
Lymphocytes Relative: 28 %
Lymphs Abs: 1.8 10*3/uL (ref 0.7–4.0)
MCH: 32.2 pg (ref 26.0–34.0)
MCHC: 32.7 g/dL (ref 30.0–36.0)
MCV: 98.4 fL (ref 80.0–100.0)
Monocytes Absolute: 0.7 10*3/uL (ref 0.1–1.0)
Monocytes Relative: 10 %
Neutro Abs: 4 10*3/uL (ref 1.7–7.7)
Neutrophils Relative %: 61 %
Platelets: 299 10*3/uL (ref 150–400)
RBC: 3.76 MIL/uL — ABNORMAL LOW (ref 3.87–5.11)
RDW: 12.7 % (ref 11.5–15.5)
WBC: 6.5 10*3/uL (ref 4.0–10.5)
nRBC: 0 % (ref 0.0–0.2)

## 2023-06-16 LAB — CULTURE, BLOOD (ROUTINE X 2)

## 2023-06-16 LAB — CBG MONITORING, ED: Glucose-Capillary: 121 mg/dL — ABNORMAL HIGH (ref 70–99)

## 2023-06-16 LAB — CREATININE, SERUM
Creatinine, Ser: 0.75 mg/dL (ref 0.44–1.00)
GFR, Estimated: >60 mL/min (ref >60)

## 2023-06-16 LAB — TSH: TSH: 1.486 u[IU]/mL (ref 0.350–4.500)

## 2023-06-16 LAB — GLUCOSE, CAPILLARY: Glucose-Capillary: 123 mg/dL — ABNORMAL HIGH (ref 70–99)

## 2023-06-16 MED ORDER — SODIUM CHLORIDE 0.9 % IV SOLN: INTRAVENOUS | Status: DC

## 2023-06-16 MED ORDER — ALBUTEROL SULFATE (2.5 MG/3ML) 0.083% IN NEBU: 2.5000 mg | INHALATION_SOLUTION | RESPIRATORY_TRACT | Status: DC | PRN

## 2023-06-16 MED ORDER — INSULIN ASPART 100 UNIT/ML IJ SOLN
0.0000 [IU] | Freq: Three times a day (TID) | INTRAMUSCULAR | Status: DC
  Filled 2023-06-16: qty 0.09

## 2023-06-16 MED ORDER — GABAPENTIN 100 MG PO CAPS
100.0000 mg | ORAL_CAPSULE | Freq: Every day | ORAL | Status: DC
  Administered 2023-06-16: 100 mg via ORAL
  Filled 2023-06-16: qty 1

## 2023-06-16 MED ORDER — ACETAMINOPHEN 650 MG RE SUPP: 650.0000 mg | Freq: Four times a day (QID) | RECTAL | Status: DC | PRN

## 2023-06-16 MED ORDER — ONDANSETRON HCL 4 MG/2ML IJ SOLN: 4.0000 mg | Freq: Four times a day (QID) | INTRAMUSCULAR | Status: DC | PRN

## 2023-06-16 MED ORDER — LACTATED RINGERS IV BOLUS
1000.0000 mL | Freq: Once | INTRAVENOUS | Status: AC
  Administered 2023-06-16: 1000 mL via INTRAVENOUS

## 2023-06-16 MED ORDER — MECLIZINE HCL 25 MG PO TABS
25.0000 mg | ORAL_TABLET | Freq: Two times a day (BID) | ORAL | Status: DC | PRN
  Administered 2023-06-16: 25 mg via ORAL
  Filled 2023-06-16: qty 1

## 2023-06-16 MED ORDER — DONEPEZIL HCL 5 MG PO TABS
10.0000 mg | ORAL_TABLET | Freq: Every day | ORAL | Status: DC
  Administered 2023-06-16: 10 mg via ORAL
  Filled 2023-06-16: qty 2

## 2023-06-16 MED ORDER — ASPIRIN 81 MG PO TBEC
81.0000 mg | DELAYED_RELEASE_TABLET | Freq: Every day | ORAL | Status: DC
  Administered 2023-06-17: 81 mg via ORAL
  Filled 2023-06-16: qty 1

## 2023-06-16 MED ORDER — ACETAMINOPHEN 325 MG PO TABS: 650.0000 mg | ORAL_TABLET | Freq: Four times a day (QID) | ORAL | Status: DC | PRN

## 2023-06-16 MED ORDER — ACETAMINOPHEN 500 MG PO TABS: 500.0000 mg | ORAL_TABLET | Freq: Four times a day (QID) | ORAL | Status: DC | PRN

## 2023-06-16 MED ORDER — AZELASTINE HCL 0.1 % NA SOLN: 1.0000 | NASAL | Status: DC | PRN

## 2023-06-16 MED ORDER — APIXABAN 5 MG PO TABS
5.0000 mg | ORAL_TABLET | Freq: Two times a day (BID) | ORAL | Status: DC
  Administered 2023-06-16 – 2023-06-17 (×2): 5 mg via ORAL
  Filled 2023-06-16 (×2): qty 1

## 2023-06-16 MED ORDER — ONDANSETRON HCL 4 MG PO TABS: 4.0000 mg | ORAL_TABLET | Freq: Four times a day (QID) | ORAL | Status: DC | PRN

## 2023-06-16 MED ORDER — SENNOSIDES-DOCUSATE SODIUM 8.6-50 MG PO TABS: 1.0000 | ORAL_TABLET | Freq: Every evening | ORAL | Status: DC | PRN

## 2023-06-16 MED ORDER — ATORVASTATIN CALCIUM 10 MG PO TABS
20.0000 mg | ORAL_TABLET | Freq: Every evening | ORAL | Status: DC
  Administered 2023-06-16: 20 mg via ORAL
  Filled 2023-06-16: qty 2

## 2023-06-16 MED ORDER — PANTOPRAZOLE SODIUM 20 MG PO TBEC
20.0000 mg | DELAYED_RELEASE_TABLET | Freq: Every day | ORAL | Status: DC
  Administered 2023-06-17: 20 mg via ORAL
  Filled 2023-06-16: qty 1

## 2023-06-16 NOTE — H&P (Signed)
History and Physical    Patient: Jacqueline Ball WUJ:[id number] DOB: 1941/03/29 DOA: 06/16/2023 DOS: the patient was seen and examined on 06/16/2023 PCP: Wilmer Floor, NP  Patient coming from: Home  Chief Complaint:  Chief Complaint  Patient presents with   Dizziness   HPI:  Patient is a poor historian.  Family members are not at bedside.  Jacqueline Ball is a 82 y.o. female with medical history significant of dementia, anxiety disorder, diabetes mellitus on insulin therapy, hypertension, hyperlipidemia who presents from home on account of complaints of dizziness over the past couple days.  Dizziness occurs at rest and with exertion.  Patient feels weak and unusually tired.  She however denies any chest pain or palpitations.  Denies any fever or chills.  Denies nausea or vomiting.  She admits compliance with her medications.  Denies any recent diarrhea or constipation.  She was last admitted a few weeks prior on account of hypoglycemia.  She was discharged home on a lower dose of Lantus.  In the ED, patient was reported to be hypotensive requiring IV fluids boluses.  Workup thus far however were negative for any source of infection. Review of Systems: As mentioned in the history of present illness. All other systems reviewed and are negative. Past Medical History:  Diagnosis Date   Anxiety    Atrial fibrillation (HCC)    Diabetes mellitus    Hyperlipidemia    Hypertension    Memory loss    Mood disorder [facility])    Physical exam, annual 10/22/06   Renal cyst    Past Surgical History:  Procedure Laterality Date   ABDOMINAL HYSTERECTOMY     Social History:  reports that she has never smoked. She has never used smokeless tobacco. She reports that she does not drink alcohol and does not use drugs.  Allergies  Allergen Reactions   Metoprolol Nausea And Vomiting    Family History  Problem Relation Age of Onset   Diabetes Sister     Prior to Admission medications   Medication Sig  Start Date End Date Taking? Authorizing Provider  ACCU-CHEK GUIDE test strip 1 each by Other route See admin instructions. USE AS DIRECTED UP TO THREE TIMES DAILY 01/16/21   [provider]  acetaminophen (TYLENOL) 500 MG tablet Take 500 mg by mouth every 6 (six) hours as needed for pain.    [provider]  apixaban (ELIQUIS) 5 MG TABS tablet Take 1 tablet (5 mg total) by mouth 2 (two) times daily. 11/24/14   Rinaldo Cloud, MD  atenolol (TENORMIN) 50 MG tablet Take 50 mg by mouth daily. 07/29/19   [provider]  atorvastatin (LIPITOR) 20 MG tablet Take 20 mg by mouth every evening.    [provider]  azelastine (ASTELIN) 0.1 % nasal spray Place 1 spray into both nostrils as needed for rhinitis or allergies.  05/24/18   [provider]  Blood Glucose Monitoring Suppl (ACCU-CHEK GUIDE) w/Device KIT 1 each by Other route See admin instructions. USE AS DIRECTED UP TO THREE TIMES DAILY 01/16/21   [provider]  cyclobenzaprine (FLEXERIL) 5 MG tablet Take 1-2 tablets (5-10 mg total) by mouth at bedtime. 06/06/20   Wieters, Hallie C, PA-C  diclofenac Sodium (VOLTAREN) 1 % GEL Apply one application (2g) up to four times daily as needed for pain. 05/19/23   Nira Conn, MD  donepezil (ARICEPT) 10 MG tablet Take 1 tablet (10 mg total) by mouth at bedtime. 06/06/17   Marvel Plan,  MD  esomeprazole (NEXIUM) 40 MG capsule Take 40 mg by mouth daily at 12 noon.    [provider]  fluconazole (DIFLUCAN) 150 MG tablet Take 150 mg by mouth once.    [provider]  fluticasone (FLONASE) 50 MCG/ACT nasal spray Place 1 spray into both nostrils daily. Patient taking differently: Place 1 spray into both nostrils daily as needed for allergies. 09/15/18   Dahlia Byes A, NP  gabapentin (NEURONTIN) 100 MG capsule Take 100 mg by mouth at bedtime.    [provider]  insulin glargine (LANTUS) 100 UNIT/ML injection Inject 0.12 mLs (12 Units  total) into the skin every evening. 05/29/23   Osvaldo Shipper, MD  losartan (COZAAR) 25 MG tablet Take 25 mg by mouth daily. 05/03/20   [provider]  metFORMIN (GLUCOPHAGE) 500 MG tablet Take 1 tablet (500 mg total) by mouth 2 (two) times daily with a meal. 06/29/22   Charlynne Pander, MD  mupirocin ointment (BACTROBAN) 2 % Apply 1 application. topically 2 (two) times daily. To affected area till better 05/09/22   Zenia Resides, MD  nystatin cream (MYCOSTATIN) Apply to affected area 2 times daily 04/02/20   Dahlia Byes A, NP  pantoprazole (PROTONIX) 20 MG tablet Take 1 tablet (20 mg total) by mouth daily. 02/24/20   Coralyn Mark, NP  traMADol (ULTRAM) 50 MG tablet Take 1 tablet (50 mg total) by mouth 2 (two) times daily as needed for severe pain. for pain 11/23/21   Antony Madura, [place]    Physical Exam: Vitals:   06/16/23 1500 06/16/23 1530 06/16/23 1551 06/16/23 1600  BP: 118/69 116/69  112/69  Pulse: (!) 56 (!) 50  (!) 56  Resp: (!) 21 15  (!) 22  Temp:   (S) 98.1 F (36.7 C)   TempSrc:   Rectal   SpO2: 99% 99%  98%    Anemia.  Think of.  7-this writer right hip pain 1.57  5711 significant adhesive [id number] okay good thank you I will just call her to  Thank Data Reviewed: CT head negative for any acute intracranial normalities. Lactic 2.9>>3.7 Sodium 136, potassium 3.8, chloride 104, bicarb 24 BUN 13, creatinine 0.8 glucose 126, WBC 6.5, hemoglobin 12.1, hematocrit 37, platelet count 299 Urinalysis was unremarkable.  Chest x-ray shows no active disease.  Assessment and Plan:  82 year old female with dementia, hypertension, diabetes mellitus and hyperlipidemia who presents with 2 days history of dizziness/ vertigo of unclear etiology.  No prior history of CVA.  No focal deficits.  CT scan on admission were negative for any acute intracranial normalities.  Dizziness/giddiness/Vetigo: Etiology is unclear.  No source of infection at this time.  Differentials  included posterior cerebellar stroke.  If symptoms do persist, consider MRI of the brain to rule out any posterior cerebellar strokes.  Other possible differentials include orthostasis.  Elevated lactic acidosis: No obvious source of infection.  Patient does not exhibit any inflammatory response symptoms at this time.  Patient will continued on IV fluids.  Type 2 diabetes mellitus: Last A1c was 7.4.  Insulin sliding scale per protocol.  Monitor fingerstick glucose  Chronic atrial fibrillation on anticoagulation with Eliquis.  Rate and rhythm within normal limits.  Her heart rate runs low in the 50s.  At baseline, patient is on atenolol.    Essential hypertension: Due to hypotension on presentation, will hold off on BP medications.  Will resume as tolerated by patients blood pressure  Cognitive impairment/dementia: Patient is on Aricept  at home.  Agitation protocol in place for any behavioral abnormalities.   Advance Care Planning:   Code Status: Full Code   Consults: None  Family Communication: No family member at bedside at this time.  Attempts will be made to call family to help with history.  Severity of Illness: The appropriate patient status for this patient is OBSERVATION. Observation status is judged to be reasonable and necessary in order to provide the required intensity of service to ensure the patient's safety. The patient's presenting symptoms, physical exam findings, and initial radiographic and laboratory data in the context of their medical condition is felt to place them at decreased risk for further clinical deterioration. Furthermore, it is anticipated that the patient will be medically stable for discharge from the hospital within 2 midnights of admission.   Author: Lilia Pro, MD 06/16/2023 4:46 PM  For on call review [email or web address].

## 2023-06-16 NOTE — ED Notes (Signed)
Medic obtained manual pressure of 90/60

## 2023-06-16 NOTE — ED Provider Notes (Signed)
[place] EMERGENCY DEPARTMENT AT [facility] Provider Note   CSN: [id number] Arrival date & time: 06/16/23  1233     History  Chief Complaint  Patient presents with   Dizziness    Jacqueline Ball is a 82 y.o. female.  Patient is a 82 year old female with a history of dementia, hypertension, diabetes, hyperlipidemia, atrial fibrillation on Eliquis.  She presents with dizziness.  She says she has been dizzy for the last couple days although her husband thinks it just started today.  She describes it more as a lightheaded type feeling.  She denies any dizziness when she is laying down.  She denies any dizziness when she moves her head from side-to-side.  It seems like it is worse when she stands up and tries to walk around.  She denies any recent injuries.  No speech deficits or vision changes.  No numbness or weakness to her extremities.  She denies any fevers.  No cough or cold symptoms.  No urinary symptoms.  No vomiting or diarrhea.  Of note, patient and her family are very poor historians.       Home Medications Prior to Admission medications   Medication Sig Start Date End Date Taking? Authorizing Provider  ACCU-CHEK GUIDE test strip 1 each by Other route See admin instructions. USE AS DIRECTED UP TO THREE TIMES DAILY 01/16/21   [provider]  acetaminophen (TYLENOL) 500 MG tablet Take 500 mg by mouth every 6 (six) hours as needed for pain.    [provider]  apixaban (ELIQUIS) 5 MG TABS tablet Take 1 tablet (5 mg total) by mouth 2 (two) times daily. 11/24/14   Rinaldo Cloud, MD  atenolol (TENORMIN) 50 MG tablet Take 50 mg by mouth daily. 07/29/19   [provider]  atorvastatin (LIPITOR) 20 MG tablet Take 20 mg by mouth every evening.    [provider]  azelastine (ASTELIN) 0.1 % nasal spray Place 1 spray into both nostrils as needed for rhinitis or allergies.  05/24/18   [provider]  Blood Glucose Monitoring Suppl  (ACCU-CHEK GUIDE) w/Device KIT 1 each by Other route See admin instructions. USE AS DIRECTED UP TO THREE TIMES DAILY 01/16/21   [provider]  cyclobenzaprine (FLEXERIL) 5 MG tablet Take 1-2 tablets (5-10 mg total) by mouth at bedtime. 06/06/20   Wieters, Hallie C, PA-C  diclofenac Sodium (VOLTAREN) 1 % GEL Apply one application (2g) up to four times daily as needed for pain. 05/19/23   Nira Conn, MD  donepezil (ARICEPT) 10 MG tablet Take 1 tablet (10 mg total) by mouth at bedtime. 06/06/17   Marvel Plan, MD  esomeprazole (NEXIUM) 40 MG capsule Take 40 mg by mouth daily at 12 noon.    [provider]  fluconazole (DIFLUCAN) 150 MG tablet Take 150 mg by mouth once.    [provider]  fluticasone (FLONASE) 50 MCG/ACT nasal spray Place 1 spray into both nostrils daily. Patient taking differently: Place 1 spray into both nostrils daily as needed for allergies. 09/15/18   Dahlia Byes A, NP  gabapentin (NEURONTIN) 100 MG capsule Take 100 mg by mouth at bedtime.    [provider]  insulin glargine (LANTUS) 100 UNIT/ML injection Inject 0.12 mLs (12 Units total) into the skin every evening. 05/29/23   Osvaldo Shipper, MD  losartan (COZAAR) 25 MG tablet Take 25 mg by mouth daily. 05/03/20   [provider]  metFORMIN (GLUCOPHAGE) 500 MG tablet Take 1 tablet (  500 mg total) by mouth 2 (two) times daily with a meal. 06/29/22   Charlynne Pander, MD  mupirocin ointment (BACTROBAN) 2 % Apply 1 application. topically 2 (two) times daily. To affected area till better 05/09/22   Zenia Resides, MD  nystatin cream (MYCOSTATIN) Apply to affected area 2 times daily 04/02/20   Dahlia Byes A, NP  pantoprazole (PROTONIX) 20 MG tablet Take 1 tablet (20 mg total) by mouth daily. 02/24/20   Coralyn Mark, NP  traMADol (ULTRAM) 50 MG tablet Take 1 tablet (50 mg total) by mouth 2 (two) times daily as needed for severe pain. for pain 11/23/21   Antony Madura, PA-C       Allergies    Metoprolol    Review of Systems   Review of Systems  Constitutional:  Negative for chills, diaphoresis, fatigue and fever.  HENT:  Negative for congestion, rhinorrhea and sneezing.   Eyes: Negative.   Respiratory:  Negative for cough, chest tightness and shortness of breath.   Cardiovascular:  Negative for chest pain and leg swelling.  Gastrointestinal:  Negative for abdominal pain, blood in stool, diarrhea, nausea and vomiting.  Genitourinary:  Negative for difficulty urinating, flank pain, frequency and hematuria.  Musculoskeletal:  Negative for arthralgias and back pain.  Skin:  Negative for rash.  Neurological:  Positive for dizziness and light-headedness. Negative for speech difficulty, weakness, numbness and headaches.    Physical Exam Updated Vital Signs BP (!) 107/94   Pulse (!) 56   Temp (S) 98.1 F (36.7 C) (Rectal)   Resp 13   SpO2 100%  Physical Exam Constitutional:      Appearance: She is well-developed.  HENT:     Head: Normocephalic and atraumatic.  Eyes:     Extraocular Movements: Extraocular movements intact.     Pupils: Pupils are equal, round, and reactive to light.     Comments: No nystagmus  Cardiovascular:     Rate and Rhythm: Normal rate and regular rhythm.     Heart sounds: Normal heart sounds.  Pulmonary:     Effort: Pulmonary effort is normal. No respiratory distress.     Breath sounds: Normal breath sounds. No wheezing or rales.  Chest:     Chest wall: No tenderness.  Abdominal:     General: Bowel sounds are normal.     Palpations: Abdomen is soft.     Tenderness: There is no abdominal tenderness. There is no guarding or rebound.  Musculoskeletal:        General: Normal range of motion.     Cervical back: Normal range of motion and neck supple.  Lymphadenopathy:     Cervical: No cervical adenopathy.  Skin:    General: Skin is warm and dry.     Findings: No rash.  Neurological:     General: No focal deficit present.      Mental Status: She is alert.     Comments: Oriented to person, place and year     ED Results / Procedures / Treatments   Labs (all labs ordered are listed, but only abnormal results are displayed) Labs Reviewed  COMPREHENSIVE METABOLIC PANEL - Abnormal; Notable for the following components:      Result Value   Glucose, Bld 126 (*)    All other components within normal limits  LACTIC ACID, PLASMA - Abnormal; Notable for the following components:   Lactic Acid, Venous 2.9 (*)    All other components within normal limits  LACTIC ACID,  PLASMA - Abnormal; Notable for the following components:   Lactic Acid, Venous 3.7 (*)    All other components within normal limits  CBC WITH DIFFERENTIAL/PLATELET - Abnormal; Notable for the following components:   RBC 3.76 (*)    All other components within normal limits  URINALYSIS, W/ REFLEX TO CULTURE (INFECTION SUSPECTED) - Abnormal; Notable for the following components:   Color, Urine STRAW (*)    Protein, ur 30 (*)    Bacteria, UA RARE (*)    All other components within normal limits  CBG MONITORING, ED - Abnormal; Notable for the following components:   Glucose-Capillary 121 (*)    All other components within normal limits  CULTURE, BLOOD (ROUTINE X 2)  CULTURE, BLOOD (ROUTINE X 2)    EKG EKG Interpretation Date/Time:  Sunday June 16 2023 12:48:03 EDT Ventricular Rate:  64 PR Interval:  189 QRS Duration:  90 QT Interval:  407 QTC Calculation: 420 R Axis:   -1  Text Interpretation: Sinus rhythm Low voltage, precordial leads since last tracing no significant change Confirmed by Rolan Bucco [phone]) on 06/16/2023 1:01:46 PM  Radiology DG Chest Port 1 View  Result Date: 06/16/2023 CLINICAL DATA:  Possible sepsis.  Dizziness for several days. EXAM: PORTABLE CHEST 1 VIEW COMPARISON:  CT angio chest 05/28/2023; chest radiograph 05/28/2023, 02/11/2022 FINDINGS: The heart size and mediastinal contours are within normal limits. Both lungs  are clear. No pleural effusion or pneumothorax. The visualized skeletal structures are unremarkable. IMPRESSION: No active disease. Electronically Signed   By: Sherron Ales M.D.   On: 06/16/2023 14:09    Procedures Procedures    Medications Ordered in ED Medications  lactated ringers bolus 1,000 mL (0 mLs Intravenous Stopped 06/16/23 1458)    ED Course/ Medical Decision Making/ A&P                             Medical Decision Making Amount and/or Complexity of Data Reviewed Labs: ordered. Radiology: ordered.  Risk Decision regarding hospitalization.   Patient is a 82 year old female who presents with dizziness.  It sounds to be more lightheadedness rather than vertiginous.  However she is a poor historian.  I do not appreciate any focal neurologic deficits.  She does not have any nystagmus on exam.  She does not have any fever.  Her rectal temp is actually little bit on the low side at 98.1.  She was hypotensive on arrival with blood pressures initially in the 80s.  Manual blood pressure was 90/60.  This does seem to have improved with IV fluids.  She was given 1 L of LR.  Her last blood pressure is 107/94.  She still feels a bit weak and dizzy.  She does have some lactic acidosis as well.  Her initial lactate is 2.9.  Repeat after fluids was over 3.  She is on metformin which could be contributing to this.  At this point I do not appreciate an infectious etiology.  Her white count is normal.  She did have blood cultures drawn however.  Her urine is not consistent with infection.  Chest x-ray was obtained.  This was interpreted by me and confirmed by the radiologist.  She does not have any apparent skin lesions.  However given her episodes of hypotension associated with lactic acidosis, I have consulted with the hospitalist for overnight admission and observation.  I have not administered antibiotics at this point.   Final Clinical Impression(s) /  ED Diagnoses Final diagnoses:   Hypotension, unspecified hypotension type  Lactic acidosis    Rx / DC Orders ED Discharge Orders          Ordered    TSH        06/16/23 1601              Rolan Bucco, MD 06/16/23 1614    Rolan Bucco, MD 06/18/23 1431

## 2023-06-16 NOTE — ED Triage Notes (Signed)
Pt c/o continued dizziness for several days. Denies any SOB, pain or any other sx

## 2023-06-17 DIAGNOSIS — R42 Dizziness and giddiness: Secondary | ICD-10-CM

## 2023-06-17 LAB — COMPREHENSIVE METABOLIC PANEL
ALT: 17 U/L (ref 0–44)
AST: 18 U/L (ref 15–41)
Albumin: 3.7 g/dL (ref 3.5–5.0)
Alkaline Phosphatase: 59 U/L (ref 38–126)
Anion gap: 8 (ref 5–15)
BUN: 10 mg/dL (ref 8–23)
CO2: 22 mmol/L (ref 22–32)
Calcium: 9.1 mg/dL (ref 8.9–10.3)
Chloride: 110 mmol/L (ref 98–111)
Creatinine, Ser: 0.7 mg/dL (ref 0.44–1.00)
GFR, Estimated: >60 mL/min (ref >60)
Glucose, Bld: 93 mg/dL (ref 70–99)
Potassium: 3.4 mmol/L — ABNORMAL LOW (ref 3.5–5.1)
Sodium: 140 mmol/L (ref 135–145)
Total Bilirubin: 0.6 mg/dL (ref 0.3–1.2)
Total Protein: 6.6 g/dL (ref 6.5–8.1)

## 2023-06-17 LAB — CBC
HCT: 38.1 % (ref 36.0–46.0)
Hemoglobin: 12.3 g/dL (ref 12.0–15.0)
MCH: 32.5 pg (ref 26.0–34.0)
MCHC: 32.3 g/dL (ref 30.0–36.0)
MCV: 100.5 fL — ABNORMAL HIGH (ref 80.0–100.0)
Platelets: 296 10*3/uL (ref 150–400)
RBC: 3.79 MIL/uL — ABNORMAL LOW (ref 3.87–5.11)
RDW: 12.9 % (ref 11.5–15.5)
WBC: 7.8 10*3/uL (ref 4.0–10.5)
nRBC: 0 % (ref 0.0–0.2)

## 2023-06-17 LAB — GLUCOSE, CAPILLARY: Glucose-Capillary: 104 mg/dL — ABNORMAL HIGH (ref 70–99)

## 2023-06-17 LAB — CULTURE, BLOOD (ROUTINE X 2): Culture: NO GROWTH

## 2023-06-17 MED ORDER — POTASSIUM CHLORIDE CRYS ER 20 MEQ PO TBCR
40.0000 meq | EXTENDED_RELEASE_TABLET | Freq: Once | ORAL | Status: AC
  Administered 2023-06-17: 40 meq via ORAL
  Filled 2023-06-17: qty 2

## 2023-06-17 NOTE — Evaluation (Signed)
Physical Therapy Evaluation Patient Details Name: Jacqueline Ball MRN: [id number] DOB: 1941/11/13 Today's Date: 06/17/2023  History of Present Illness  Pt is 82 yo female admitted on 06/16/23.  She presented with dizziness for a couple days - described as more lightheadedness - worse with walking.  BP was soft at admission (90/60) and pt given fluid.  Noted orthostatic taken today and were stable with resting BP of 128/70. Pt with hx including but not limited to  dementia, anxiety disorder, diabetes mellitus on insulin therapy, hypertension, hyperlipidemia  Clinical Impression  Pt admitted with above diagnosis.  Pt has support at home and is typically independent. She has hx of dementia but overall was able to follow commands, oriented, and answered questions appropriately.  She did have some difficulty providing detail in regards to her dizziness.    Screened vestibular system as pt had difficulty providing cause, timing, type of dizziness (although seemed more lightheaded and with activity).  Vestibular workup was negative and pt was able to ambulate 250' without AD and denied dizziness.  Did provide min guard for safety.  Will maintain on caseload while admitted to advance mobility and safety; however, suspect pt near baseline and could return home with family when discharged.     Recommendations for follow up therapy are one component of a multi-disciplinary discharge planning process, led by the attending physician.  Recommendations may be updated based on patient status, additional functional criteria and insurance authorization.  Follow Up Recommendations       Assistance Recommended at Discharge Intermittent Supervision/Assistance  Patient can return home with the following  A little help with walking and/or transfers;A little help with bathing/dressing/bathroom;Assistance with cooking/housework    Equipment Recommendations None recommended by PT  Recommendations for Other Services        Functional Status Assessment Patient has had a recent decline in their functional status and demonstrates the ability to make significant improvements in function in a reasonable and predictable amount of time.     Precautions / Restrictions Precautions Precautions: Fall      Mobility  Bed Mobility Overal bed mobility: Needs Assistance Bed Mobility: Supine to Sit, Sit to Supine     Supine to sit: Supervision Sit to supine: Supervision        Transfers Overall transfer level: Needs assistance Equipment used: None Transfers: Sit to/from Stand Sit to Stand: Min guard           General transfer comment: min guard for safety    Ambulation/Gait Ambulation/Gait assistance: Min guard Gait Distance (Feet): 250 Feet Assistive device: None Gait Pattern/deviations: Step-through pattern Gait velocity: decreased     General Gait Details: Min guard for safety, no dizziness or lightheadedness, VSS, tolerated well  Stairs            Wheelchair Mobility    Modified Rankin (Stroke Patients Only)       Balance Overall balance assessment: Needs assistance Sitting-balance support: No upper extremity supported Sitting balance-Leahy Scale: Good     Standing balance support: No upper extremity supported Standing balance-Leahy Scale: Good Standing balance comment: Ambulated without AD               High Level Balance Comments: Pt slowed with head turns, navigating around obstacles, and turns but no LOB             Pertinent Vitals/Pain      Home Living Family/patient expects to be discharged to:: Private residence Living Arrangements: Spouse/significant other Available Help at Discharge:  Family;Available 24 hours/day Type of Home: House Home Access: Stairs to enter Entrance Stairs-Rails: None;Right Entrance Stairs-Number of Steps: 5 Alternate Level Stairs-Number of Steps: flight Home Layout: Multi-level;Bed/bath upstairs Home Equipment: Advice worker (2 wheels);Cane - single point Additional Comments: Pt questionable historian (obtained from chart review 2 weeks ago)    Prior Function Prior Level of Function : Needs assist             Mobility Comments: Ambulates without AD ADLs Comments: Husband assist with IADLs; Pt reports independent ADLs     Hand Dominance        Extremity/Trunk Assessment   Upper Extremity Assessment Upper Extremity Assessment: Overall WFL for tasks assessed    Lower Extremity Assessment Lower Extremity Assessment: Overall WFL for tasks assessed    Cervical / Trunk Assessment Cervical / Trunk Assessment: Normal  Communication   Communication: No difficulties  Cognition Arousal/Alertness: Awake/alert Behavior During Therapy: WFL for tasks assessed/performed Overall Cognitive Status: No family/caregiver present to determine baseline cognitive functioning                                 General Comments: Pt was oriented x 4, has hx of dementia , did have difficulty providing detail in regards to dizziness/lightheadedness, follows basic commands        General Comments General comments (skin integrity, edema, etc.): Pt reports dizziness was more of a lightheaded feeling and seemed to occur with OOB activity.  Did note soft BP at admission and latest orthostatic BP negative.  Vestibular Screen: EOEM/Smooth Pursuit: intact, no dizziness, no nystagmus Test of Skew: negative Head Turns: no dizziness [employer] and Horizontal roll : Negative    Exercises     Assessment/Plan    PT Assessment Patient needs continued PT services  PT Problem List Decreased cognition;Decreased activity tolerance;Decreased safety awareness;Decreased balance;Decreased mobility;Decreased knowledge of use of DME       PT Treatment Interventions DME instruction;Therapeutic exercise;Gait training;Balance training;Stair training;Neuromuscular re-education;Functional mobility  training;Cognitive remediation;Therapeutic activities;Patient/family education    PT Goals (Current goals can be found in the Care Plan section)  Acute Rehab PT Goals Patient Stated Goal: home soon PT Goal Formulation: All assessment and education complete, DC therapy    Frequency Min 1X/week     Co-evaluation               AM-PAC PT "6 Clicks" Mobility  Outcome Measure Help needed turning from your back to your side while in a flat bed without using bedrails?: None Help needed moving from lying on your back to sitting on the side of a flat bed without using bedrails?: None Help needed moving to and from a bed to a chair (including a wheelchair)?: A Little Help needed standing up from a chair using your arms (e.g., wheelchair or bedside chair)?: A Little Help needed to walk in hospital room?: A Little Help needed climbing 3-5 steps with a railing? : A Little 6 Click Score: 20    End of Session Equipment Utilized During Treatment: Gait belt Activity Tolerance: Patient tolerated treatment well Patient left: in bed;with call bell/phone within reach;with bed alarm set Nurse Communication: Mobility status PT Visit Diagnosis: Other abnormalities of gait and mobility (R26.89);Muscle weakness (generalized) (M62.81)    Time: 7829-5621 PT Time Calculation (min) (ACUTE ONLY): 20 min   Charges:   PT Evaluation $PT Eval Low Complexity: 1 Low  Anise Salvo, PT Acute Rehab [facility] Rehab [phone]   Rayetta Humphrey 06/17/2023, 12:21 PM

## 2023-06-17 NOTE — TOC CM/SW Note (Signed)
Transition of Care [facility]) - Inpatient Brief Assessment   Patient Details  Name: Jacqueline Ball MRN: [id number] Date of Birth: 01/03/1941  Transition of Care [facility]) CM/SW Contact:    Otelia Santee, LCSW Phone Number: 06/17/2023, 11:26 AM   Clinical Narrative: Chart reviewed. No TOC needs.   Transition of Care Asessment: Insurance and Status: Insurance coverage has been reviewed Patient has primary care physician: Yes Home environment has been reviewed: Home w/ spouse Prior level of function:: Independent Prior/Current Home Services: No current home services Social Determinants of Health Reivew: SDOH reviewed no interventions necessary Readmission risk has been reviewed: Yes Transition of care needs: no transition of care needs at this time

## 2023-06-17 NOTE — Plan of Care (Signed)

## 2023-06-17 NOTE — Discharge Summary (Signed)
Physician Discharge Summary   Patient: Jacqueline Ball MRN: [id number] DOB: April 15, 1941  Admit date:     06/16/2023  Discharge date: 06/17/23  Discharge Physician: Tyrone Nine   PCP: Wilmer Floor, NP   Recommendations at discharge:  Monitor ECG. Atenolol held due to sinus bradycardia with rate into 40's persistently and a 2.6 second sinus pause.  Consider repeat lactic acid to confirm resolution of elevation, suspected to be related to dehydration and metformin use.   Discharge Diagnoses: Principal Problem:   Dizziness and giddiness   Hospital Course: Jacqueline Ball is an 82 y.o. female with medical history significant of dementia, anxiety disorder, diabetes mellitus on insulin therapy, hypertension, hyperlipidemia who presents from home on account of complaints of dizziness over the past couple days.  Dizziness occurs at rest and with exertion.  Patient feels weak and unusually tired.  She however denies any chest pain or palpitations.  Denies any fever or chills.  Denies nausea or vomiting.  She admits compliance with her medications.  Denies any recent diarrhea or constipation.  She was last admitted a few weeks prior on account of hypoglycemia.  She was discharged home on a lower dose of Lantus.   In the ED, patient was reported to be hypotensive requiring IV fluids boluses.  Workup thus far however were negative for any source of infection.  She presented 6/28 for the same symptoms, dizziness/lightheadedness in setting of decreased oral intake.   Since arrival, she's felt significantly improved with resolution of symptoms. She ate all of breakfast, no nausea or vomiting. Labs are stable and improving.   Assessment and Plan: Dizziness: No nystagmus on exam, no focal deficits on exam, CT negative for acute intracranial abnormalities. Her history would suggest dehydration-related/orthostatic lightheadedness as the etiology. She, thankfully, is now eating quite well, tolerating plenty of  fluids. There were no orthostatic symptoms and negative orthostatic vital sign changes on morning of discharge. PT evaluation requested: Pt ambulated 227ft without AD and resolved symptoms.  Elevated lactic acidosis: No obvious source of infection. Suspect dehydration and metformin use.   Type 2 diabetes mellitus: Last A1c was 7.4%.     Chronic atrial fibrillation on anticoagulation with Eliquis: NSR with persistent sinus bradycardia overnight. Given bradycardic rate here, atenolol will be held until follow up. Aricept also likely contributing to bradycardia.    Essential hypertension: Hold atenolol for now   Cognitive impairment/dementia: Continue aricept.  GERD:  - Continue PPI (note redundant PPI x2 on MAR, suggested she stop one of these)  Consultants: None Procedures performed: None  Disposition: Home Diet recommendation:  Regular diet DISCHARGE MEDICATION: Allergies as of 06/17/2023       Reactions   Metoprolol Nausea And Vomiting        Medication List     STOP taking these medications    atenolol 50 MG tablet Commonly known as: TENORMIN   pantoprazole 20 MG tablet Commonly known as: PROTONIX       TAKE these medications    Accu-Chek Guide test strip Generic drug: glucose blood 1 each by Other route See admin instructions. USE AS DIRECTED UP TO THREE TIMES DAILY   Accu-Chek Guide w/Device Kit 1 each by Other route See admin instructions. USE AS DIRECTED UP TO THREE TIMES DAILY   acetaminophen 500 MG tablet Commonly known as: TYLENOL Take 500 mg by mouth every 6 (six) hours as needed for pain.   apixaban 5 MG Tabs tablet Commonly known as: ELIQUIS Take 1 tablet (5 mg  total) by mouth 2 (two) times daily.   atorvastatin 20 MG tablet Commonly known as: LIPITOR Take 20 mg by mouth every evening.   azelastine 0.1 % nasal spray Commonly known as: ASTELIN Place 1 spray into both nostrils as needed for rhinitis or allergies.   cyclobenzaprine 5 MG  tablet Commonly known as: FLEXERIL Take 1-2 tablets (5-10 mg total) by mouth at bedtime.   diclofenac Sodium 1 % Gel Commonly known as: Voltaren Apply one application (2g) up to four times daily as needed for pain.   donepezil 10 MG tablet Commonly known as: ARICEPT Take 1 tablet (10 mg total) by mouth at bedtime.   esomeprazole 40 MG capsule Commonly known as: NEXIUM Take 40 mg by mouth daily at 12 noon.   fluconazole 150 MG tablet Commonly known as: DIFLUCAN Take 150 mg by mouth once.   fluticasone 50 MCG/ACT nasal spray Commonly known as: FLONASE Place 1 spray into both nostrils daily. What changed:  when to take this reasons to take this   gabapentin 100 MG capsule Commonly known as: NEURONTIN Take 100 mg by mouth at bedtime.   insulin glargine 100 UNIT/ML injection Commonly known as: LANTUS Inject 0.12 mLs (12 Units total) into the skin every evening.   losartan 25 MG tablet Commonly known as: COZAAR Take 25 mg by mouth daily.   metFORMIN 500 MG tablet Commonly known as: GLUCOPHAGE Take 1 tablet (500 mg total) by mouth 2 (two) times daily with a meal.   mupirocin ointment 2 % Commonly known as: BACTROBAN Apply 1 application. topically 2 (two) times daily. To affected area till better   nystatin cream Commonly known as: MYCOSTATIN Apply to affected area 2 times daily   traMADol 50 MG tablet Commonly known as: ULTRAM Take 1 tablet (50 mg total) by mouth 2 (two) times daily as needed for severe pain. for pain        Discharge Exam: Filed Weights   06/16/23 1900  Weight: 60.1 kg  BP 130/71 (BP Location: Left Arm)   Pulse (!) 58   Temp 97.8 F (36.6 C) (Oral)   Resp 16   Ht 5\' 4"  (1.626 m)   Wt 60.1 kg   SpO2 100%   BMI 22.74 kg/m   Elderly female with decreased muscle bulk, normal tone, no focal neurological deficits. She's oriented but a circuitous historian. She's in no distress. Clear lungs, nonlabored respirations. Regular rate and rhythm  by telemetry review.   Condition at discharge: stable  The results of significant diagnostics from this hospitalization (including imaging, microbiology, ancillary and laboratory) are listed below for reference.   Imaging Studies: CT Head Wo Contrast  Result Date: 06/16/2023 CLINICAL DATA:  Dizziness several days with generalized weakness. EXAM: CT HEAD WITHOUT CONTRAST TECHNIQUE: Contiguous axial images were obtained from the base of the skull through the vertex without intravenous contrast. RADIATION DOSE REDUCTION: This exam was performed according to the departmental dose-optimization program which includes automated exposure control, adjustment of the mA and/or kV according to patient size and/or use of iterative reconstruction technique. COMPARISON:  02/01/2023 FINDINGS: Brain: Ventricles, cisterns and other CSF spaces are within normal as there is minimal age related atrophic change present. There is no mass, mass effect, shift of midline structures or acute hemorrhage. No acute infarction. Vascular: No hyperdense vessel or unexpected calcification. Skull: Normal. Negative for fracture or focal lesion. Sinuses/Orbits: Orbits are normal.  Paranasal sinuses are clear. Other: None. IMPRESSION: 1. No acute findings. 2. Minimal age  related atrophic change. Electronically Signed   By: Elberta Fortis M.D.   On: 06/16/2023 16:31   DG Chest Port 1 View  Result Date: 06/16/2023 CLINICAL DATA:  Possible sepsis.  Dizziness for several days. EXAM: PORTABLE CHEST 1 VIEW COMPARISON:  CT angio chest 05/28/2023; chest radiograph 05/28/2023, 02/11/2022 FINDINGS: The heart size and mediastinal contours are within normal limits. Both lungs are clear. No pleural effusion or pneumothorax. The visualized skeletal structures are unremarkable. IMPRESSION: No active disease. Electronically Signed   By: Sherron Ales M.D.   On: 06/16/2023 14:09   CT Angio Chest PE W and/or Wo Contrast  Result Date: 05/29/2023 CLINICAL  DATA:  Pulmonary embolism (PE) suspected, high prob. Shortness of breath EXAM: CT ANGIOGRAPHY CHEST WITH CONTRAST TECHNIQUE: Multidetector CT imaging of the chest was performed using the standard protocol during bolus administration of intravenous contrast. Multiplanar CT image reconstructions and MIPs were obtained to evaluate the vascular anatomy. RADIATION DOSE REDUCTION: This exam was performed according to the departmental dose-optimization program which includes automated exposure control, adjustment of the mA and/or kV according to patient size and/or use of iterative reconstruction technique. CONTRAST:  80mL OMNIPAQUE IOHEXOL 350 MG/ML SOLN COMPARISON:  01/23/2012 FINDINGS: Cardiovascular: No filling defects in the pulmonary arteries to suggest pulmonary emboli. Heart is normal size. Aorta is normal caliber. Scattered coronary artery and aortic calcifications. Mediastinum/Nodes: No mediastinal, hilar, or axillary adenopathy. Trachea and esophagus are unremarkable. Thyroid unremarkable. Lungs/Pleura: No confluent airspace opacities or effusions. Linear areas of atelectasis or scarring in the right lower lobe. Upper Abdomen: No acute findings Musculoskeletal: Chest wall soft tissues are unremarkable. No acute bony abnormality. Review of the MIP images confirms the above findings. IMPRESSION: No evidence of pulmonary embolus. No acute cardiopulmonary disease. Areas of scarring or atelectasis in the right lower lobe. Scattered coronary artery disease. Aortic Atherosclerosis (ICD10-I70.0). Electronically Signed   By: Charlett Nose M.D.   On: 05/29/2023 00:10   DG Chest 2 View  Result Date: 05/28/2023 CLINICAL DATA:  Shortness of breath. EXAM: CHEST - 2 VIEW COMPARISON:  February 11, 2022 FINDINGS: The heart size and mediastinal contours are within normal limits. A small curvilinear surgical suture is seen overlying the medial aspect of the left apex. This is located within the superficial soft tissues of the  upper back on the lateral view. Both lungs are clear. The visualized skeletal structures are unremarkable. IMPRESSION: No active cardiopulmonary disease. Electronically Signed   By: Aram Candela M.D.   On: 05/28/2023 18:53   DG Hand Complete Right  Result Date: 05/18/2023 CLINICAL DATA:  Bilateral hand pain. No known injury. EXAM: RIGHT HAND - COMPLETE 3+ VIEW COMPARISON:  Radiograph 04/01/2020 FINDINGS: There is no evidence of fracture or dislocation. Scattered osteoarthritis primarily throughout the digits. Degenerative change at the thumb carpal metacarpal joint has mildly progressed. No erosions or periostitis. No focal soft tissue abnormalities. IMPRESSION: 1. Scattered osteoarthritis throughout the digits. 2. Mild progression of degenerative change at the thumb carpal metacarpal joint. 3. No evidence of inflammatory arthropathy.  No acute findings. Electronically Signed   By: Narda Rutherford M.D.   On: 05/18/2023 23:09   DG Hand Complete Left  Result Date: 05/18/2023 CLINICAL DATA:  Bilateral hand pain. No known injury. EXAM: LEFT HAND - COMPLETE 3+ VIEW COMPARISON:  Left hand radiograph 07/16/2019 FINDINGS: There is no evidence of fracture or dislocation. Scattered osteoarthritis primarily throughout the digits. Degenerative changes at the thumb carpal metacarpal joint has slightly progressed from 2020 exam. No  focal soft tissue abnormality soft tissues are unremarkable. IMPRESSION: 1. Scattered osteoarthritis primarily throughout the digits. 2. Mild progression of degenerative change at the thumb carpometacarpal joint. 3. Chondrocalcinosis of the triangular fibrocartilage. 4. No evidence of inflammatory arthropathy or acute findings. Electronically Signed   By: Narda Rutherford M.D.   On: 05/18/2023 23:07    Microbiology: Results for orders placed or performed during the hospital encounter of 06/16/23  Culture, blood (routine x 2)     Status: None (Preliminary result)   Collection Time:  06/16/23  4:00 PM   Specimen: BLOOD RIGHT FOREARM  Result Value Ref Range Status   Specimen Description   Final    BLOOD RIGHT FOREARM Performed at [facility], [address], [place], [place] [address]    Special Requests   Final    BOTTLES DRAWN AEROBIC AND ANAEROBIC Blood Culture adequate volume Performed at [facility], [address] [place], [place] [address]    Culture   Final    NO GROWTH < 24 HOURS Performed at [facility], [address] [place], [place] [address]    Report Status PENDING  Incomplete  Culture, blood (routine x 2)     Status: None (Preliminary result)   Collection Time: 06/16/23  4:51 PM   Specimen: BLOOD  Result Value Ref Range Status   Specimen Description   Final    BLOOD RIGHT ANTECUBITAL Performed at [facility], [address] [place], [place] [address]    Special Requests   Final    BOTTLES DRAWN AEROBIC AND ANAEROBIC Blood Culture adequate volume Performed at [facility], [address] [place], [place] [address]    Culture   Final    NO GROWTH < 24 HOURS Performed at [facility], [address] [place], [place] [address]    Report Status PENDING  Incomplete    Labs: CBC: Recent Labs  Lab 06/14/23 2010 06/16/23 1314 06/17/23 0502  WBC 9.0 6.5 7.8  NEUTROABS  --  4.0  --   HGB 12.2 12.1 12.3  HCT 36.8 37.0 38.1  MCV 96.8 98.4 100.5*  PLT 326 299 296   Basic Metabolic Panel: Recent Labs  Lab 06/14/23 2010 06/16/23 1314 06/16/23 1726 06/17/23 0502  NA 133* 136  --  140  K 3.5 3.8  --  3.4*  CL 102 104  --  110  CO2 21* 24  --  22  GLUCOSE 207* 126*  --  93  BUN 18 13  --  10  CREATININE 0.90 0.80 0.75 0.70  CALCIUM 9.2 9.1  --  9.1   Liver Function Tests: Recent Labs  Lab 06/16/23 1314 06/17/23 0502  AST 19 18  ALT 16 17  ALKPHOS 63 59  BILITOT 0.7 0.6  PROT 7.4 6.6  ALBUMIN 4.1 3.7   CBG: Recent Labs  Lab 06/14/23 2001  06/16/23 1243 06/16/23 2039 06/17/23 0726  GLUCAP 216* 121* 123* 104*    Discharge time spent: greater than 30 minutes.  Signed: Tyrone Nine, MD [facility] 06/17/2023

## 2023-06-17 NOTE — Progress Notes (Signed)
Patient has telemetry order that is set to expire soon, notified on-call provider Johann Capers, NP) that she has been sinus brady in the 50's with a few dips into the 40's, and she also had a 2.65 second pause this evening. Cardiac monitoring has been reordered for another 12 hours.

## 2023-06-18 DIAGNOSIS — E11649 Type 2 diabetes mellitus with hypoglycemia without coma: Secondary | ICD-10-CM | POA: Diagnosis not present

## 2023-06-18 DIAGNOSIS — I10 Essential (primary) hypertension: Secondary | ICD-10-CM | POA: Diagnosis not present

## 2023-06-18 DIAGNOSIS — Z7984 Long term (current) use of oral hypoglycemic drugs: Secondary | ICD-10-CM | POA: Diagnosis not present

## 2023-06-18 DIAGNOSIS — I482 Chronic atrial fibrillation, unspecified: Secondary | ICD-10-CM | POA: Diagnosis not present

## 2023-06-18 DIAGNOSIS — Z9181 History of falling: Secondary | ICD-10-CM | POA: Diagnosis not present

## 2023-06-18 DIAGNOSIS — K219 Gastro-esophageal reflux disease without esophagitis: Secondary | ICD-10-CM | POA: Diagnosis not present

## 2023-06-18 DIAGNOSIS — Z794 Long term (current) use of insulin: Secondary | ICD-10-CM | POA: Diagnosis not present

## 2023-06-18 DIAGNOSIS — G301 Alzheimer's disease with late onset: Secondary | ICD-10-CM | POA: Diagnosis not present

## 2023-06-18 DIAGNOSIS — Z556 Problems related to health literacy: Secondary | ICD-10-CM | POA: Diagnosis not present

## 2023-06-18 DIAGNOSIS — E440 Moderate protein-calorie malnutrition: Secondary | ICD-10-CM | POA: Diagnosis not present

## 2023-06-18 DIAGNOSIS — N390 Urinary tract infection, site not specified: Secondary | ICD-10-CM | POA: Diagnosis not present

## 2023-06-18 DIAGNOSIS — I700 Atherosclerosis of aorta: Secondary | ICD-10-CM | POA: Diagnosis not present

## 2023-06-18 DIAGNOSIS — M1990 Unspecified osteoarthritis, unspecified site: Secondary | ICD-10-CM | POA: Diagnosis not present

## 2023-06-18 DIAGNOSIS — Z7901 Long term (current) use of anticoagulants: Secondary | ICD-10-CM | POA: Diagnosis not present

## 2023-06-18 DIAGNOSIS — F0284 Dementia in other diseases classified elsewhere, unspecified severity, with anxiety: Secondary | ICD-10-CM | POA: Diagnosis not present

## 2023-06-18 LAB — CULTURE, BLOOD (ROUTINE X 2): Special Requests: ADEQUATE

## 2023-06-19 LAB — CULTURE, BLOOD (ROUTINE X 2)
Culture: NO GROWTH
Special Requests: ADEQUATE

## 2023-06-20 DIAGNOSIS — Z9181 History of falling: Secondary | ICD-10-CM | POA: Diagnosis not present

## 2023-06-20 DIAGNOSIS — Z7984 Long term (current) use of oral hypoglycemic drugs: Secondary | ICD-10-CM | POA: Diagnosis not present

## 2023-06-20 DIAGNOSIS — E11649 Type 2 diabetes mellitus with hypoglycemia without coma: Secondary | ICD-10-CM | POA: Diagnosis not present

## 2023-06-20 DIAGNOSIS — I700 Atherosclerosis of aorta: Secondary | ICD-10-CM | POA: Diagnosis not present

## 2023-06-20 DIAGNOSIS — Z7901 Long term (current) use of anticoagulants: Secondary | ICD-10-CM | POA: Diagnosis not present

## 2023-06-20 DIAGNOSIS — I10 Essential (primary) hypertension: Secondary | ICD-10-CM | POA: Diagnosis not present

## 2023-06-20 DIAGNOSIS — F0284 Dementia in other diseases classified elsewhere, unspecified severity, with anxiety: Secondary | ICD-10-CM | POA: Diagnosis not present

## 2023-06-20 DIAGNOSIS — E440 Moderate protein-calorie malnutrition: Secondary | ICD-10-CM | POA: Diagnosis not present

## 2023-06-20 DIAGNOSIS — Z556 Problems related to health literacy: Secondary | ICD-10-CM | POA: Diagnosis not present

## 2023-06-20 DIAGNOSIS — I482 Chronic atrial fibrillation, unspecified: Secondary | ICD-10-CM | POA: Diagnosis not present

## 2023-06-20 DIAGNOSIS — K219 Gastro-esophageal reflux disease without esophagitis: Secondary | ICD-10-CM | POA: Diagnosis not present

## 2023-06-20 DIAGNOSIS — Z794 Long term (current) use of insulin: Secondary | ICD-10-CM | POA: Diagnosis not present

## 2023-06-20 DIAGNOSIS — M1990 Unspecified osteoarthritis, unspecified site: Secondary | ICD-10-CM | POA: Diagnosis not present

## 2023-06-20 DIAGNOSIS — N390 Urinary tract infection, site not specified: Secondary | ICD-10-CM | POA: Diagnosis not present

## 2023-06-20 DIAGNOSIS — G301 Alzheimer's disease with late onset: Secondary | ICD-10-CM | POA: Diagnosis not present

## 2023-06-21 LAB — CULTURE, BLOOD (ROUTINE X 2)

## 2023-06-26 ENCOUNTER — Emergency Department (HOSPITAL_COMMUNITY)
Admission: EM | Admit: 2023-06-26 | Discharge: 2023-06-26 | Disposition: A | Discharge status: Home / Self Care | Payer: Medicare HMO | Attending: Emergency Medicine | Admitting: Emergency Medicine

## 2023-06-26 ENCOUNTER — Other Ambulatory Visit: Payer: Self-pay

## 2023-06-26 ENCOUNTER — Encounter (HOSPITAL_COMMUNITY): Payer: Self-pay

## 2023-06-26 DIAGNOSIS — E860 Dehydration: Secondary | ICD-10-CM | POA: Diagnosis not present

## 2023-06-26 DIAGNOSIS — I10 Essential (primary) hypertension: Secondary | ICD-10-CM | POA: Diagnosis not present

## 2023-06-26 DIAGNOSIS — R110 Nausea: Secondary | ICD-10-CM | POA: Insufficient documentation

## 2023-06-26 DIAGNOSIS — Z7901 Long term (current) use of anticoagulants: Secondary | ICD-10-CM | POA: Diagnosis not present

## 2023-06-26 DIAGNOSIS — Z79899 Other long term (current) drug therapy: Secondary | ICD-10-CM | POA: Diagnosis not present

## 2023-06-26 DIAGNOSIS — Z7984 Long term (current) use of oral hypoglycemic drugs: Secondary | ICD-10-CM | POA: Insufficient documentation

## 2023-06-26 DIAGNOSIS — Z794 Long term (current) use of insulin: Secondary | ICD-10-CM | POA: Diagnosis not present

## 2023-06-26 DIAGNOSIS — R112 Nausea with vomiting, unspecified: Secondary | ICD-10-CM | POA: Diagnosis not present

## 2023-06-26 DIAGNOSIS — R42 Dizziness and giddiness: Secondary | ICD-10-CM | POA: Diagnosis not present

## 2023-06-26 LAB — CBC
HCT: 39.5 % (ref 36.0–46.0)
Hemoglobin: 13.2 g/dL (ref 12.0–15.0)
MCH: 31.7 pg (ref 26.0–34.0)
MCHC: 33.4 g/dL (ref 30.0–36.0)
MCV: 95 fL (ref 80.0–100.0)
Platelets: 372 10*3/uL (ref 150–400)
RBC: 4.16 MIL/uL (ref 3.87–5.11)
RDW: 12.7 % (ref 11.5–15.5)
WBC: 8.6 10*3/uL (ref 4.0–10.5)
nRBC: 0 % (ref 0.0–0.2)

## 2023-06-26 LAB — HEPATIC FUNCTION PANEL
ALT: 12 U/L (ref 0–44)
AST: 20 U/L (ref 15–41)
Albumin: 4.1 g/dL (ref 3.5–5.0)
Alkaline Phosphatase: 69 U/L (ref 38–126)
Bilirubin, Direct: 0.2 mg/dL (ref 0.0–0.2)
Indirect Bilirubin: 0.4 mg/dL (ref 0.3–0.9)
Total Bilirubin: 0.6 mg/dL (ref 0.3–1.2)
Total Protein: 7.5 g/dL (ref 6.5–8.1)

## 2023-06-26 LAB — BASIC METABOLIC PANEL
Anion gap: 12 (ref 5–15)
BUN: 18 mg/dL (ref 8–23)
CO2: 21 mmol/L — ABNORMAL LOW (ref 22–32)
Calcium: 9.8 mg/dL (ref 8.9–10.3)
Chloride: 102 mmol/L (ref 98–111)
Creatinine, Ser: 1.31 mg/dL — ABNORMAL HIGH (ref 0.44–1.00)
GFR, Estimated: 41 mL/min — ABNORMAL LOW (ref >60)
Glucose, Bld: 117 mg/dL — ABNORMAL HIGH (ref 70–99)
Potassium: 3.8 mmol/L (ref 3.5–5.1)
Sodium: 135 mmol/L (ref 135–145)

## 2023-06-26 LAB — CBG MONITORING, ED: Glucose-Capillary: 119 mg/dL — ABNORMAL HIGH (ref 70–99)

## 2023-06-26 LAB — TROPONIN I (HIGH SENSITIVITY): Troponin I (High Sensitivity): 11 ng/L (ref <18)

## 2023-06-26 LAB — LIPASE, BLOOD: Lipase: 39 U/L (ref 11–51)

## 2023-06-26 MED ORDER — SODIUM CHLORIDE 0.9 % IV BOLUS
1000.0000 mL | Freq: Once | INTRAVENOUS | Status: AC
  Administered 2023-06-26: 1000 mL via INTRAVENOUS

## 2023-06-26 MED ORDER — ONDANSETRON HCL 4 MG PO TABS: 4.0000 mg | ORAL_TABLET | Freq: Four times a day (QID) | ORAL | 0 refills | Status: DC

## 2023-06-26 NOTE — ED Provider Notes (Signed)
[place] EMERGENCY DEPARTMENT AT [facility] Provider Note   CSN: [id number] Arrival date & time: 06/26/23  1533     History  Chief Complaint  Patient presents with   Nausea    Jacqueline Ball is a 82 y.o. female.  Patient is a 82 year old woman with past history significant for HTN, HLD, presenting to the ED for nausea. Patient reports nausea, poor oral intake, and weakness for the past couple days. Upon seeing the patient, she could not immediately remember her reason for coming to the ED. Of note, patient has had multiple recent visits to the ED and admissions for similar complaints. She has not had vomiting, abdominal pain, fever or chills.         Home Medications Prior to Admission medications   Medication Sig Start Date End Date Taking? Authorizing Provider  ACCU-CHEK GUIDE test strip 1 each by Other route See admin instructions. USE AS DIRECTED UP TO THREE TIMES DAILY 01/16/21   [provider]  acetaminophen (TYLENOL) 500 MG tablet Take 500 mg by mouth every 6 (six) hours as needed for pain.    [provider]  apixaban (ELIQUIS) 5 MG TABS tablet Take 1 tablet (5 mg total) by mouth 2 (two) times daily. 11/24/14   Rinaldo Cloud, MD  atorvastatin (LIPITOR) 20 MG tablet Take 20 mg by mouth every evening.    [provider]  azelastine (ASTELIN) 0.1 % nasal spray Place 1 spray into both nostrils as needed for rhinitis or allergies.  05/24/18   [provider]  Blood Glucose Monitoring Suppl (ACCU-CHEK GUIDE) w/Device KIT 1 each by Other route See admin instructions. USE AS DIRECTED UP TO THREE TIMES DAILY 01/16/21   [provider]  cyclobenzaprine (FLEXERIL) 5 MG tablet Take 1-2 tablets (5-10 mg total) by mouth at bedtime. 06/06/20   Wieters, Hallie C, PA-C  diclofenac Sodium (VOLTAREN) 1 % GEL Apply one application (2g) up to four times daily as needed for pain. 05/19/23   Nira Conn, MD  donepezil (ARICEPT) 10 MG  tablet Take 1 tablet (10 mg total) by mouth at bedtime. 06/06/17   Marvel Plan, MD  esomeprazole (NEXIUM) 40 MG capsule Take 40 mg by mouth daily at 12 noon.    [provider]  fluconazole (DIFLUCAN) 150 MG tablet Take 150 mg by mouth once.    [provider]  fluticasone (FLONASE) 50 MCG/ACT nasal spray Place 1 spray into both nostrils daily. Patient taking differently: Place 1 spray into both nostrils daily as needed for allergies. 09/15/18   Dahlia Byes A, NP  gabapentin (NEURONTIN) 100 MG capsule Take 100 mg by mouth at bedtime.    [provider]  insulin glargine (LANTUS) 100 UNIT/ML injection Inject 0.12 mLs (12 Units total) into the skin every evening. 05/29/23   Osvaldo Shipper, MD  losartan (COZAAR) 25 MG tablet Take 25 mg by mouth daily. 05/03/20   [provider]  metFORMIN (GLUCOPHAGE) 500 MG tablet Take 1 tablet (500 mg total) by mouth 2 (two) times daily with a meal. 06/29/22   Charlynne Pander, MD  mupirocin ointment (BACTROBAN) 2 % Apply 1 application. topically 2 (two) times daily. To affected area till better 05/09/22   Zenia Resides, MD  nystatin cream (MYCOSTATIN) Apply to affected area 2 times daily 04/02/20   Dahlia Byes A, NP  traMADol (ULTRAM) 50 MG tablet Take 1 tablet (50 mg total) by mouth 2 (two) times daily as needed  for severe pain. for pain 11/23/21   Antony Madura, PA-C      Allergies    Metoprolol    Review of Systems   Review of Systems  Constitutional:  Positive for fatigue. Negative for fever.  Respiratory:  Positive for chest tightness. Negative for shortness of breath.   Gastrointestinal:  Positive for nausea. Negative for abdominal pain and vomiting.  Genitourinary:  Negative for dysuria.    Physical Exam Updated Vital Signs BP (!) 137/97   Pulse 66   Temp 98.5 F (36.9 C) (Oral)   Resp 16   Ht 5\' 4"  (1.626 m)   Wt 60.1 kg   SpO2 100%   BMI 22.74 kg/m  Physical Exam Constitutional:      General: She is  not in acute distress. Cardiovascular:     Rate and Rhythm: Normal rate and regular rhythm.     Heart sounds: Normal heart sounds.  Pulmonary:     Effort: Pulmonary effort is normal. No respiratory distress.     Breath sounds: Normal breath sounds.  Abdominal:     General: There is no distension.     Palpations: Abdomen is soft.     Tenderness: There is no abdominal tenderness.  Neurological:     Mental Status: She is alert.     ED Results / Procedures / Treatments   Labs (all labs ordered are listed, but only abnormal results are displayed) Labs Reviewed  BASIC METABOLIC PANEL - Abnormal; Notable for the following components:      Result Value   CO2 21 (*)    Glucose, Bld 117 (*)    Creatinine, Ser 1.31 (*)    GFR, Estimated 41 (*)    All other components within normal limits  CBG MONITORING, ED - Abnormal; Notable for the following components:   Glucose-Capillary 119 (*)    All other components within normal limits  CBC  URINALYSIS, ROUTINE W REFLEX MICROSCOPIC  HEPATIC FUNCTION PANEL  LIPASE, BLOOD  TROPONIN I (HIGH SENSITIVITY)    EKG EKG Interpretation Date/Time:  Wednesday June 26 2023 16:15:13 EDT Ventricular Rate:  89 PR Interval:  150 QRS Duration:  68 QT Interval:  332 QTC Calculation: 403 R Axis:   24  Text Interpretation: Normal sinus rhythm Cannot rule out Anterior infarct , age undetermined T wave abnormality, consider lateral ischemia Abnormal ECG No significant change since last tracing Confirmed by Melene Plan [phone]) on 06/26/2023 7:30:33 PM  Radiology No results found.  Procedures Procedures    Medications Ordered in ED Medications  sodium chloride 0.9 % bolus 1,000 mL (1,000 mLs Intravenous New Bag/Given 06/26/23 2048)    ED Course/ Medical Decision Making/ A&P                             Medical Decision Making Amount and/or Complexity of Data Reviewed Labs: ordered.  Medical Decision Making:   Jacqueline Ball is a 82 y.o. female who  presented to the ED today with nausea detailed above.    Complete initial physical exam performed, notably the patient  was in no acute distress with no abdominal tenderness.    Reviewed and confirmed nursing documentation for past medical history, family history, social history.    Initial Assessment:   With the patient's presentation of nausea, most likely diagnosis is dehydration. Other diagnoses were considered including (but not limited to) colitis, UTI, URI, sepsis. These are considered less likely due to history  of present illness and physical exam findings.    Initial Plan:   Screening labs including CBC and Metabolic panel to evaluate for infectious or metabolic etiology of disease.  CXR to evaluate for structural/infectious intrathoracic pathology.  EKG to evaluate for cardiac pathology Objective evaluation as below reviewed   Initial Study Results:   Laboratory  All laboratory results reviewed without evidence of clinically relevant pathology.    EKG EKG was reviewed independently. Rate, rhythm, axis, intervals all examined and without medically relevant abnormality. ST segments without concerns for elevations.    Radiology:  All images reviewed independently. Agree with radiology report at this time.   CT Head Wo Contrast  Result Date: 06/16/2023 CLINICAL DATA:  Dizziness several days with generalized weakness. EXAM: CT HEAD WITHOUT CONTRAST TECHNIQUE: Contiguous axial images were obtained from the base of the skull through the vertex without intravenous contrast. RADIATION DOSE REDUCTION: This exam was performed according to the departmental dose-optimization program which includes automated exposure control, adjustment of the mA and/or kV according to patient size and/or use of iterative reconstruction technique. COMPARISON:  02/01/2023 FINDINGS: Brain: Ventricles, cisterns and other CSF spaces are within normal as there is minimal age related atrophic change present. There is  no mass, mass effect, shift of midline structures or acute hemorrhage. No acute infarction. Vascular: No hyperdense vessel or unexpected calcification. Skull: Normal. Negative for fracture or focal lesion. Sinuses/Orbits: Orbits are normal.  Paranasal sinuses are clear. Other: None. IMPRESSION: 1. No acute findings. 2. Minimal age related atrophic change. Electronically Signed   By: Elberta Fortis M.D.   On: 06/16/2023 16:31   DG Chest Port 1 View  Result Date: 06/16/2023 CLINICAL DATA:  Possible sepsis.  Dizziness for several days. EXAM: PORTABLE CHEST 1 VIEW COMPARISON:  CT angio chest 05/28/2023; chest radiograph 05/28/2023, 02/11/2022 FINDINGS: The heart size and mediastinal contours are within normal limits. Both lungs are clear. No pleural effusion or pneumothorax. The visualized skeletal structures are unremarkable. IMPRESSION: No active disease. Electronically Signed   By: Sherron Ales M.D.   On: 06/16/2023 14:09   CT Angio Chest PE W and/or Wo Contrast  Result Date: 05/29/2023 CLINICAL DATA:  Pulmonary embolism (PE) suspected, high prob. Shortness of breath EXAM: CT ANGIOGRAPHY CHEST WITH CONTRAST TECHNIQUE: Multidetector CT imaging of the chest was performed using the standard protocol during bolus administration of intravenous contrast. Multiplanar CT image reconstructions and MIPs were obtained to evaluate the vascular anatomy. RADIATION DOSE REDUCTION: This exam was performed according to the departmental dose-optimization program which includes automated exposure control, adjustment of the mA and/or kV according to patient size and/or use of iterative reconstruction technique. CONTRAST:  80mL OMNIPAQUE IOHEXOL 350 MG/ML SOLN COMPARISON:  01/23/2012 FINDINGS: Cardiovascular: No filling defects in the pulmonary arteries to suggest pulmonary emboli. Heart is normal size. Aorta is normal caliber. Scattered coronary artery and aortic calcifications. Mediastinum/Nodes: No mediastinal, hilar, or  axillary adenopathy. Trachea and esophagus are unremarkable. Thyroid unremarkable. Lungs/Pleura: No confluent airspace opacities or effusions. Linear areas of atelectasis or scarring in the right lower lobe. Upper Abdomen: No acute findings Musculoskeletal: Chest wall soft tissues are unremarkable. No acute bony abnormality. Review of the MIP images confirms the above findings. IMPRESSION: No evidence of pulmonary embolus. No acute cardiopulmonary disease. Areas of scarring or atelectasis in the right lower lobe. Scattered coronary artery disease. Aortic Atherosclerosis (ICD10-I70.0). Electronically Signed   By: Charlett Nose M.D.   On: 05/29/2023 00:10   DG Chest 2 View  Result  Date: 05/28/2023 CLINICAL DATA:  Shortness of breath. EXAM: CHEST - 2 VIEW COMPARISON:  February 11, 2022 FINDINGS: The heart size and mediastinal contours are within normal limits. A small curvilinear surgical suture is seen overlying the medial aspect of the left apex. This is located within the superficial soft tissues of the upper back on the lateral view. Both lungs are clear. The visualized skeletal structures are unremarkable. IMPRESSION: No active cardiopulmonary disease. Electronically Signed   By: Aram Candela M.D.   On: 05/28/2023 18:53    Reassessment and Plan:   Patient is an 82 year old woman presenting with nausea. Physical exam was significant for signs of dehydration. All labs and imaging were reassuring and pointed to no acute pathology. Patient was given 1L NS for hydration. On reassessment, patient was resting comfortably in bed. Patient agreed with plan to be discharged.          Final Clinical Impression(s) / ED Diagnoses Final diagnoses:  None    Rx / DC Orders ED Discharge Orders     None         Monna Fam, MD 06/26/23 2235    Melene Plan, DO 06/26/23 2247

## 2023-06-26 NOTE — ED Triage Notes (Signed)
Pt c/o nausea every time she eatsx1d. Pt denies vomiting. Pt c/o generalized weaknessx2-3d.

## 2023-06-26 NOTE — Discharge Instructions (Addendum)
Take Zofran as needed for nausea. Please continue to emphasize eating and drinking to maintain hydration and blood pressure.

## 2023-06-27 DIAGNOSIS — Z9181 History of falling: Secondary | ICD-10-CM | POA: Diagnosis not present

## 2023-06-27 DIAGNOSIS — M1990 Unspecified osteoarthritis, unspecified site: Secondary | ICD-10-CM | POA: Diagnosis not present

## 2023-06-27 DIAGNOSIS — E440 Moderate protein-calorie malnutrition: Secondary | ICD-10-CM | POA: Diagnosis not present

## 2023-06-27 DIAGNOSIS — Z794 Long term (current) use of insulin: Secondary | ICD-10-CM | POA: Diagnosis not present

## 2023-06-27 DIAGNOSIS — G301 Alzheimer's disease with late onset: Secondary | ICD-10-CM | POA: Diagnosis not present

## 2023-06-27 DIAGNOSIS — Z7901 Long term (current) use of anticoagulants: Secondary | ICD-10-CM | POA: Diagnosis not present

## 2023-06-27 DIAGNOSIS — Z556 Problems related to health literacy: Secondary | ICD-10-CM | POA: Diagnosis not present

## 2023-06-27 DIAGNOSIS — I10 Essential (primary) hypertension: Secondary | ICD-10-CM | POA: Diagnosis not present

## 2023-06-27 DIAGNOSIS — F0284 Dementia in other diseases classified elsewhere, unspecified severity, with anxiety: Secondary | ICD-10-CM | POA: Diagnosis not present

## 2023-06-27 DIAGNOSIS — I700 Atherosclerosis of aorta: Secondary | ICD-10-CM | POA: Diagnosis not present

## 2023-06-27 DIAGNOSIS — Z7984 Long term (current) use of oral hypoglycemic drugs: Secondary | ICD-10-CM | POA: Diagnosis not present

## 2023-06-27 DIAGNOSIS — N390 Urinary tract infection, site not specified: Secondary | ICD-10-CM | POA: Diagnosis not present

## 2023-06-27 DIAGNOSIS — E11649 Type 2 diabetes mellitus with hypoglycemia without coma: Secondary | ICD-10-CM | POA: Diagnosis not present

## 2023-06-27 DIAGNOSIS — K219 Gastro-esophageal reflux disease without esophagitis: Secondary | ICD-10-CM | POA: Diagnosis not present

## 2023-06-27 DIAGNOSIS — I482 Chronic atrial fibrillation, unspecified: Secondary | ICD-10-CM | POA: Diagnosis not present

## 2023-07-07 ENCOUNTER — Emergency Department (HOSPITAL_COMMUNITY)
Admission: EM | Admit: 2023-07-07 | Discharge: 2023-07-07 | Disposition: A | Discharge status: Home / Self Care | Payer: Medicare HMO | Attending: Emergency Medicine | Admitting: Emergency Medicine

## 2023-07-07 ENCOUNTER — Encounter (HOSPITAL_COMMUNITY): Payer: Self-pay

## 2023-07-07 ENCOUNTER — Other Ambulatory Visit: Payer: Self-pay

## 2023-07-07 DIAGNOSIS — S90812A Abrasion, left foot, initial encounter: Secondary | ICD-10-CM | POA: Diagnosis not present

## 2023-07-07 DIAGNOSIS — S90811A Abrasion, right foot, initial encounter: Secondary | ICD-10-CM | POA: Diagnosis not present

## 2023-07-07 DIAGNOSIS — X58XXXA Exposure to other specified factors, initial encounter: Secondary | ICD-10-CM | POA: Insufficient documentation

## 2023-07-07 DIAGNOSIS — S91301A Unspecified open wound, right foot, initial encounter: Secondary | ICD-10-CM | POA: Diagnosis not present

## 2023-07-07 DIAGNOSIS — Z7901 Long term (current) use of anticoagulants: Secondary | ICD-10-CM | POA: Insufficient documentation

## 2023-07-07 NOTE — ED Provider Notes (Signed)
[place] EMERGENCY DEPARTMENT AT [facility] Provider Note   CSN: [id number] Arrival date & time: 07/07/23  1344     History  Chief Complaint  Patient presents with   Foot Pain    Jacqueline Ball is a 82 y.o. female.  Patient complains of wounds on both feet.  Patient reports right foot is worse than left.  Patient complains of soreness in both heels right greater than left.  Patient does not recall any injuries she has not had any falls.  Patient denies significant difficulty ambulating she reports areas are sore when she walks.  The history is provided by the patient and a relative. No language interpreter was used.  Foot Pain This is a new problem. Episode onset: 2 weeks. The problem occurs constantly. Nothing aggravates the symptoms. Nothing relieves the symptoms. She has tried nothing for the symptoms. The treatment provided no relief.       Home Medications Prior to Admission medications   Medication Sig Start Date End Date Taking? Authorizing Provider  ACCU-CHEK GUIDE test strip 1 each by Other route See admin instructions. USE AS DIRECTED UP TO THREE TIMES DAILY 01/16/21   [provider]  acetaminophen (TYLENOL) 500 MG tablet Take 500 mg by mouth every 6 (six) hours as needed for pain.    [provider]  apixaban (ELIQUIS) 5 MG TABS tablet Take 1 tablet (5 mg total) by mouth 2 (two) times daily. 11/24/14   Rinaldo Cloud, MD  atorvastatin (LIPITOR) 20 MG tablet Take 20 mg by mouth every evening.    [provider]  azelastine (ASTELIN) 0.1 % nasal spray Place 1 spray into both nostrils as needed for rhinitis or allergies.  05/24/18   [provider]  Blood Glucose Monitoring Suppl (ACCU-CHEK GUIDE) w/Device KIT 1 each by Other route See admin instructions. USE AS DIRECTED UP TO THREE TIMES DAILY 01/16/21   [provider]  cyclobenzaprine (FLEXERIL) 5 MG tablet Take 1-2 tablets (5-10 mg total) by mouth at bedtime. 06/06/20    Wieters, Hallie C, PA-C  diclofenac Sodium (VOLTAREN) 1 % GEL Apply one application (2g) up to four times daily as needed for pain. 05/19/23   Nira Conn, MD  donepezil (ARICEPT) 10 MG tablet Take 1 tablet (10 mg total) by mouth at bedtime. 06/06/17   Marvel Plan, MD  esomeprazole (NEXIUM) 40 MG capsule Take 40 mg by mouth daily at 12 noon.    [provider]  fluconazole (DIFLUCAN) 150 MG tablet Take 150 mg by mouth once.    [provider]  fluticasone (FLONASE) 50 MCG/ACT nasal spray Place 1 spray into both nostrils daily. Patient taking differently: Place 1 spray into both nostrils daily as needed for allergies. 09/15/18   Dahlia Byes A, NP  gabapentin (NEURONTIN) 100 MG capsule Take 100 mg by mouth at bedtime.    [provider]  insulin glargine (LANTUS) 100 UNIT/ML injection Inject 0.12 mLs (12 Units total) into the skin every evening. 05/29/23   Osvaldo Shipper, MD  losartan (COZAAR) 25 MG tablet Take 25 mg by mouth daily. 05/03/20   [provider]  metFORMIN (GLUCOPHAGE) 500 MG tablet Take 1 tablet (500 mg total) by mouth 2 (two) times daily with a meal. 06/29/22   Charlynne Pander, MD  mupirocin ointment (BACTROBAN) 2 % Apply 1 application. topically 2 (two) times daily. To affected area till better 05/09/22   Zenia Resides, MD  nystatin cream (MYCOSTATIN) Apply to affected  area 2 times daily 04/02/20   Dahlia Byes A, NP  ondansetron (ZOFRAN) 4 MG tablet Take 1 tablet (4 mg total) by mouth every 6 (six) hours. 06/26/23   Monna Fam, MD  traMADol (ULTRAM) 50 MG tablet Take 1 tablet (50 mg total) by mouth 2 (two) times daily as needed for severe pain. for pain 11/23/21   Antony Madura, PA-C      Allergies    Metoprolol    Review of Systems   Review of Systems  All other systems reviewed and are negative.   Physical Exam Updated Vital Signs BP 117/86 (BP Location: Right Arm)   Pulse (!) 103   Temp 98.2 F (36.8 C) (Oral)   Resp 16    Ht 5\' 4"  (1.626 m)   Wt 60.1 kg   SpO2 99%   BMI 22.74 kg/m  Physical Exam Vitals reviewed.  Constitutional:      Appearance: Normal appearance.  Pulmonary:     Effort: Pulmonary effort is normal.  Musculoskeletal:     Comments: Superficial abrasions right heel no gaping, slight redness left heel no open wounds.  Full range of motion bilateral feet neurovascular neurosensory are intact  Skin:    General: Skin is warm.  Neurological:     General: No focal deficit present.     Mental Status: She is alert.  Psychiatric:        Mood and Affect: Mood normal.     ED Results / Procedures / Treatments   Labs (all labs ordered are listed, but only abnormal results are displayed) Labs Reviewed - No data to display  EKG None  Radiology No results found.  Procedures Procedures    Medications Ordered in ED Medications - No data to display  ED Course/ Medical Decision Making/ A&P                             Medical Decision Making Patient complains of bilateral foot soreness  Amount and/or Complexity of Data Reviewed Independent Historian: caregiver    Details: Patient is here with her daughter who is supportive  Risk Risk Details: Patient has not had any injuries she is not having any difficulty ambulating.  Patient appears to have some abrasions on her heel probably from rubbing.  I have advised antibiotic ointment to keep the area covered.  I discussed with patient's daughter having the patient wear socks to prevent her from rubbing the areas and opening them up.  I advised follow-up with primary care physician for recheck.  I counseled on wound care           Final Clinical Impression(s) / ED Diagnoses Final diagnoses:  Abrasion of right foot, initial encounter  Abrasion of left foot, initial encounter    Rx / DC Orders ED Discharge Orders     None      An After Visit Summary was printed and given to the patient.    Elson Areas, [place] 07/07/23  1650    Terald Sleeper, MD 07/07/23 Jerene Bears

## 2023-07-07 NOTE — ED Triage Notes (Signed)
Pt c/o right foot pain in posterior of foot. Pt has dried, peeling skin near heel of footx2wks

## 2023-07-07 NOTE — Discharge Instructions (Addendum)
Apply topical antibiotic (neosporin) twice a day Keep open areas bandaged.  Keep socks on to prevent reoccurrence

## 2023-07-16 ENCOUNTER — Encounter (HOSPITAL_COMMUNITY): Payer: Self-pay | Admitting: *Deleted

## 2023-07-16 ENCOUNTER — Ambulatory Visit (HOSPITAL_COMMUNITY)
Admission: EM | Admit: 2023-07-16 | Discharge: 2023-07-16 | Disposition: A | Discharge status: Home / Self Care | Payer: Medicare HMO | Attending: Internal Medicine | Admitting: Internal Medicine

## 2023-07-16 DIAGNOSIS — F0280 Dementia in other diseases classified elsewhere without behavioral disturbance: Secondary | ICD-10-CM | POA: Insufficient documentation

## 2023-07-16 DIAGNOSIS — M545 Low back pain, unspecified: Secondary | ICD-10-CM | POA: Diagnosis not present

## 2023-07-16 DIAGNOSIS — G301 Alzheimer's disease with late onset: Secondary | ICD-10-CM | POA: Diagnosis not present

## 2023-07-16 DIAGNOSIS — N3001 Acute cystitis with hematuria: Secondary | ICD-10-CM | POA: Insufficient documentation

## 2023-07-16 LAB — POCT URINALYSIS DIP (MANUAL ENTRY)
Bilirubin, UA: NEGATIVE
Glucose, UA: NEGATIVE mg/dL
Nitrite, UA: POSITIVE — AB
Protein Ur, POC: NEGATIVE mg/dL
Spec Grav, UA: 1.02 (ref 1.010–1.025)
Urobilinogen, UA: 0.2 E.U./dL
pH, UA: 5 (ref 5.0–8.0)

## 2023-07-16 LAB — POCT FASTING CBG KUC MANUAL ENTRY: POCT Glucose (KUC): 142 mg/dL — AB (ref 70–99)

## 2023-07-16 MED ORDER — CEPHALEXIN 250 MG PO CAPS: 250.0000 mg | ORAL_CAPSULE | Freq: Two times a day (BID) | ORAL | 0 refills | Status: AC

## 2023-07-16 NOTE — ED Provider Notes (Signed)
[facility]    CSN: [id number] Arrival date & time: 07/16/23  1809      History   Chief Complaint Chief Complaint  Patient presents with   Shoulder Pain    HPI Jacqueline Ball is a 82 y.o. female.   Patient with history of dementia presents to urgent care for evaluation of right-sided lower back pain started a couple of days ago.  She is very pleasant, however confused and is a relatively poor historian.  She denies urinary symptoms, recent falls, headache, dizziness, and abdominal pain.  She has not had any recent fevers or chills.  Patient is only alert to herself and disoriented to time, place, and situation on arrival.  Patient took her husband's phone with her to be able to drive to urgent care but she is unable to explain to me how she got here on arrival.  Per chart review, takes Aricept and Namenda.  History of type 2 diabetes and atrial fibrillation.   Shoulder Pain   Past Medical History:  Diagnosis Date   Anxiety    Atrial fibrillation (HCC)    Diabetes mellitus    Hyperlipidemia    Hypertension    Memory loss    Mood disorder [facility])    Physical exam, annual 10/22/06   Renal cyst     Patient Active Problem List   Diagnosis Date Noted   Dizziness and giddiness 06/16/2023   Hypomagnesemia 05/29/2023   UTI (urinary tract infection) 05/29/2023   Malnutrition of moderate degree 05/29/2023   Hypoglycemia 05/28/2023   Shortness of breath 05/28/2023   Acute midline low back pain without sciatica 11/29/2019   GERD (gastroesophageal reflux disease) 05/12/2019   Arthritis 05/12/2019   Dementia (HCC) 05/12/2019   Hyperlipidemia 05/12/2019   Overactive bladder 05/12/2019   Chronic anticoagulation 03/18/2018   Late onset Alzheimer's disease without behavioral disturbance (HCC) 01/21/2017   Chronic atrial fibrillation (HCC) 11/02/2015   Essential hypertension 11/02/2015   Type 2 diabetes mellitus without complication, without long-term current use of insulin  (HCC) 11/02/2015   Cognitive impairment 10/31/2015   Chest pain 11/22/2014   Muscle cramps 04/17/2012   Bilateral shoulder pain 03/09/2012   Essential hypertension 03/28/2011   Diabetes mellitus type 2, noninsulin dependent (HCC) 03/28/2011   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      Home Medications    Prior to Admission medications   Medication Sig Start Date End Date Taking? Authorizing Provider  ACCU-CHEK GUIDE test strip 1 each by Other route See admin instructions. USE AS DIRECTED UP TO THREE TIMES DAILY 01/16/21  Yes [provider]  acetaminophen (TYLENOL) 500 MG tablet Take 500 mg by mouth every 6 (six) hours as needed for pain.   Yes [provider]  azelastine (ASTELIN) 0.1 % nasal spray Place 1 spray into both nostrils as needed for rhinitis or allergies.  05/24/18  Yes [provider]  Blood Glucose Monitoring Suppl (ACCU-CHEK GUIDE) w/Device KIT 1 each by Other route See admin instructions. USE AS DIRECTED UP TO THREE TIMES DAILY 01/16/21  Yes [provider]  cephALEXin (KEFLEX) 250 MG capsule Take 1 capsule (250 mg total) by mouth 2 (two) times daily for 7 days. 07/16/23 07/23/23 Yes Carlisle Beers, FNP  fluconazole (DIFLUCAN) 150 MG tablet Take 150 mg by mouth once.   Yes [provider]  insulin glargine (LANTUS) 100 UNIT/ML injection Inject 0.12 mLs (12 Units total) into the skin every evening. 05/29/23  Yes Osvaldo Shipper, MD  nystatin cream (MYCOSTATIN) Apply to affected area 2 times daily 04/02/20  Yes Bast, Traci A, NP  traMADol (ULTRAM) 50 MG tablet Take 1 tablet (50 mg total) by mouth 2 (two) times daily as needed for severe pain. for pain 11/23/21  Yes Antony Madura, PA-C  apixaban (ELIQUIS) 5 MG TABS tablet Take 1 tablet (5 mg total) by mouth 2 (two) times daily. 11/24/14   Rinaldo Cloud, MD   atorvastatin (LIPITOR) 20 MG tablet Take 20 mg by mouth every evening.    [provider]  cyclobenzaprine (FLEXERIL) 5 MG tablet Take 1-2 tablets (5-10 mg total) by mouth at bedtime. 06/06/20   Wieters, Hallie C, PA-C  diclofenac Sodium (VOLTAREN) 1 % GEL Apply one application (2g) up to four times daily as needed for pain. 05/19/23   Nira Conn, MD  donepezil (ARICEPT) 10 MG tablet Take 1 tablet (10 mg total) by mouth at bedtime. 06/06/17   Marvel Plan, MD  esomeprazole (NEXIUM) 40 MG capsule Take 40 mg by mouth daily at 12 noon.    [provider]  fluticasone (FLONASE) 50 MCG/ACT nasal spray Place 1 spray into both nostrils daily. Patient taking differently: Place 1 spray into both nostrils daily as needed for allergies. 09/15/18   Dahlia Byes A, NP  gabapentin (NEURONTIN) 100 MG capsule Take 100 mg by mouth at bedtime.    [provider]  losartan (COZAAR) 25 MG tablet Take 25 mg by mouth daily. 05/03/20   [provider]  metFORMIN (GLUCOPHAGE) 500 MG tablet Take 1 tablet (500 mg total) by mouth 2 (two) times daily with a meal. 06/29/22   Charlynne Pander, MD  mupirocin ointment (BACTROBAN) 2 % Apply 1 application. topically 2 (two) times daily. To affected area till better 05/09/22   Zenia Resides, MD  ondansetron (ZOFRAN) 4 MG tablet Take 1 tablet (4 mg total) by mouth every 6 (six) hours. 06/26/23   Monna Fam, MD    Family History Family History  Problem Relation Age of Onset   Diabetes Sister     Social History Social History   Tobacco Use   Smoking status: Never   Smokeless tobacco: Never  Vaping Use   Vaping status: Never Used  Substance Use Topics   Alcohol use: Never   Drug use: Never     Allergies   Metoprolol   Review of Systems Review of Systems   Physical Exam Triage Vital Signs ED Triage Vitals  Encounter Vitals Group     BP 07/16/23 1855 123/65     Systolic BP Percentile --      Diastolic BP  Percentile --      Pulse Rate 07/16/23 1855 82     Resp 07/16/23 1855 18     Temp 07/16/23 1855 98.1 F (36.7 C)     Temp Source 07/16/23 1855 Oral     SpO2 07/16/23 1855 98 %     Weight --      Height --      Head Circumference --      Peak Flow --      Pain Score 07/16/23 1852 8     Pain Loc --      Pain Education --      Exclude from Growth Chart --    No data found.  Updated Vital  Signs BP 123/65 (BP Location: Right Arm)   Pulse 82   Temp 98.1 F (36.7 C) (Oral)   Resp 18   SpO2 98%   Visual Acuity Right Eye Distance:   Left Eye Distance:   Bilateral Distance:    Right Eye Near:   Left Eye Near:    Bilateral Near:     Physical Exam   UC Treatments / Results  Labs (all labs ordered are listed, but only abnormal results are displayed) Labs Reviewed  POCT URINALYSIS DIP (MANUAL ENTRY) - Abnormal; Notable for the following components:      Result Value   Color, UA brown (*)    Ketones, POC UA trace (5) (*)    Blood, UA trace-lysed (*)    Nitrite, UA Positive (*)    Leukocytes, UA Small (1+) (*)    All other components within normal limits  POCT FASTING CBG [facility] MANUAL ENTRY - Abnormal; Notable for the following components:   POCT Glucose ([facility]) 142 (*)    All other components within normal limits  URINE CULTURE    EKG   Radiology No results found.  Procedures Procedures (including critical care time)  Medications Ordered in UC Medications - No data to display  Initial Impression / Assessment and Plan / UC Course  I have reviewed the triage vital signs and the nursing notes.  Pertinent labs & imaging results that were available during my care of the patient were reviewed by me and considered in my medical decision making (see chart for details).   I was able to contact patient's son via her husband's cell phone He works in [employer] I described patient's mental status to him and he states this is her baseline    Final Clinical  Impressions(s) / UC Diagnoses   Final diagnoses:  Acute right-sided low back pain without sciatica  Acute cystitis with hematuria  Late onset Alzheimer dementia, unspecified dementia severity, unspecified whether behavioral, psychotic, or mood disturbance or anxiety (HCC)     Discharge Instructions      You have a urinary tract infection. Take Keflex antibiotic twice daily for max 7 days. Staff will call you if your urine culture shows any abnormalities indicating need for change of antibiotic.  Please schedule an appointment to follow-up with primary care provider in the next 7 to 10 days for recheck.  Return to urgent care as needed.     ED Prescriptions     Medication Sig Dispense Auth. Provider   cephALEXin (KEFLEX) 250 MG capsule Take 1 capsule (250 mg total) by mouth 2 (two) times daily for 7 days. 14 capsule Carlisle Beers, FNP      PDMP not reviewed this encounter.

## 2023-07-16 NOTE — ED Triage Notes (Signed)
Pt states she has had right shoulder pain and lower back pain x couple days. She hasn't been taking any meds.    Pt states she doesn't know her meds but she knows she doesn't take them everyday, including her blood thinner. Pt states she drove here alone and her husband didn't want her to come. During triage pt asked where she was at states she didn't remember.

## 2023-07-16 NOTE — Discharge Instructions (Addendum)
You have a urinary tract infection. Take Keflex antibiotic twice daily for max 7 days. Staff will call you if your urine culture shows any abnormalities indicating need for change of antibiotic.  Please schedule an appointment to follow-up with primary care provider in the next 7 to 10 days for recheck.  Return to urgent care as needed.

## 2023-07-16 NOTE — ED Notes (Signed)
Called husbands cell number and the pt has the phone with her.  Called multiple times to son and daughter number and left message to call back.

## 2023-07-23 ENCOUNTER — Encounter (HOSPITAL_COMMUNITY): Payer: Self-pay | Admitting: Emergency Medicine

## 2023-07-23 ENCOUNTER — Emergency Department (HOSPITAL_COMMUNITY)
Admission: EM | Admit: 2023-07-23 | Discharge: 2023-07-23 | Disposition: A | Discharge status: Home / Self Care | Payer: Medicare HMO | Source: Home / Self Care | Attending: Emergency Medicine | Admitting: Emergency Medicine

## 2023-07-23 ENCOUNTER — Other Ambulatory Visit: Payer: Self-pay

## 2023-07-23 ENCOUNTER — Emergency Department (HOSPITAL_COMMUNITY): Payer: Medicare HMO

## 2023-07-23 DIAGNOSIS — R42 Dizziness and giddiness: Secondary | ICD-10-CM | POA: Insufficient documentation

## 2023-07-23 DIAGNOSIS — R29818 Other symptoms and signs involving the nervous system: Secondary | ICD-10-CM | POA: Diagnosis not present

## 2023-07-23 DIAGNOSIS — F0390 Unspecified dementia without behavioral disturbance: Secondary | ICD-10-CM | POA: Insufficient documentation

## 2023-07-23 DIAGNOSIS — E119 Type 2 diabetes mellitus without complications: Secondary | ICD-10-CM | POA: Insufficient documentation

## 2023-07-23 DIAGNOSIS — H538 Other visual disturbances: Secondary | ICD-10-CM | POA: Insufficient documentation

## 2023-07-23 DIAGNOSIS — H5789 Other specified disorders of eye and adnexa: Secondary | ICD-10-CM | POA: Diagnosis present

## 2023-07-23 DIAGNOSIS — Z7901 Long term (current) use of anticoagulants: Secondary | ICD-10-CM | POA: Insufficient documentation

## 2023-07-23 DIAGNOSIS — E876 Hypokalemia: Secondary | ICD-10-CM | POA: Diagnosis not present

## 2023-07-23 DIAGNOSIS — D649 Anemia, unspecified: Secondary | ICD-10-CM | POA: Diagnosis not present

## 2023-07-23 DIAGNOSIS — Z7984 Long term (current) use of oral hypoglycemic drugs: Secondary | ICD-10-CM | POA: Diagnosis not present

## 2023-07-23 DIAGNOSIS — E1165 Type 2 diabetes mellitus with hyperglycemia: Secondary | ICD-10-CM | POA: Diagnosis not present

## 2023-07-23 DIAGNOSIS — Z794 Long term (current) use of insulin: Secondary | ICD-10-CM | POA: Diagnosis not present

## 2023-07-23 DIAGNOSIS — B309 Viral conjunctivitis, unspecified: Secondary | ICD-10-CM | POA: Diagnosis not present

## 2023-07-23 DIAGNOSIS — Z8669 Personal history of other diseases of the nervous system and sense organs: Secondary | ICD-10-CM

## 2023-07-23 DIAGNOSIS — H539 Unspecified visual disturbance: Secondary | ICD-10-CM | POA: Diagnosis not present

## 2023-07-23 LAB — CBC WITH DIFFERENTIAL/PLATELET
Abs Immature Granulocytes: 0.02 10*3/uL (ref 0.00–0.07)
Basophils Absolute: 0 10*3/uL (ref 0.0–0.1)
Basophils Relative: 0 %
Eosinophils Absolute: 0 10*3/uL (ref 0.0–0.5)
Eosinophils Relative: 0 %
HCT: 34.6 % — ABNORMAL LOW (ref 36.0–46.0)
Hemoglobin: 11.2 g/dL — ABNORMAL LOW (ref 12.0–15.0)
Immature Granulocytes: 0 %
Lymphocytes Relative: 35 %
Lymphs Abs: 2.3 10*3/uL (ref 0.7–4.0)
MCH: 31.4 pg (ref 26.0–34.0)
MCHC: 32.4 g/dL (ref 30.0–36.0)
MCV: 96.9 fL (ref 80.0–100.0)
Monocytes Absolute: 0.7 10*3/uL (ref 0.1–1.0)
Monocytes Relative: 11 %
Neutro Abs: 3.4 10*3/uL (ref 1.7–7.7)
Neutrophils Relative %: 54 %
Platelets: 393 10*3/uL (ref 150–400)
RBC: 3.57 MIL/uL — ABNORMAL LOW (ref 3.87–5.11)
RDW: 12.8 % (ref 11.5–15.5)
WBC: 6.4 10*3/uL (ref 4.0–10.5)
nRBC: 0 % (ref 0.0–0.2)

## 2023-07-23 LAB — URINALYSIS, ROUTINE W REFLEX MICROSCOPIC
Bilirubin Urine: NEGATIVE
Glucose, UA: NEGATIVE mg/dL
Hgb urine dipstick: NEGATIVE
Ketones, ur: NEGATIVE mg/dL
Nitrite: NEGATIVE
Protein, ur: NEGATIVE mg/dL
Specific Gravity, Urine: 1.011 (ref 1.005–1.030)
pH: 5 (ref 5.0–8.0)

## 2023-07-23 LAB — COMPREHENSIVE METABOLIC PANEL
ALT: 14 U/L (ref 0–44)
AST: 16 U/L (ref 15–41)
Albumin: 3.6 g/dL (ref 3.5–5.0)
Alkaline Phosphatase: 77 U/L (ref 38–126)
Anion gap: 10 (ref 5–15)
BUN: 5 mg/dL — ABNORMAL LOW (ref 8–23)
CO2: 25 mmol/L (ref 22–32)
Calcium: 9.4 mg/dL (ref 8.9–10.3)
Chloride: 104 mmol/L (ref 98–111)
Creatinine, Ser: 0.78 mg/dL (ref 0.44–1.00)
GFR, Estimated: >60 mL/min (ref >60)
Glucose, Bld: 96 mg/dL (ref 70–99)
Potassium: 3.2 mmol/L — ABNORMAL LOW (ref 3.5–5.1)
Sodium: 139 mmol/L (ref 135–145)
Total Bilirubin: 0.3 mg/dL (ref 0.3–1.2)
Total Protein: 6.6 g/dL (ref 6.5–8.1)

## 2023-07-23 LAB — CBG MONITORING, ED: Glucose-Capillary: 84 mg/dL (ref 70–99)

## 2023-07-23 MED ORDER — POTASSIUM CHLORIDE CRYS ER 20 MEQ PO TBCR
40.0000 meq | EXTENDED_RELEASE_TABLET | Freq: Once | ORAL | Status: AC
  Administered 2023-07-23: 40 meq via ORAL
  Filled 2023-07-23: qty 2

## 2023-07-23 NOTE — Discharge Instructions (Signed)
You were seen in the emergency department after your episode of dizziness and blurred vision.  Your workup showed no signs of abnormal heart rhythms, dehydration, or stroke and your potassium level was mildly low which we repleted for you in the emergency department.  You can follow-up with your primary doctor and your eye doctor to have your symptoms rechecked.  You should return to the emergency department if you are having recurrent episodes of dizziness and vision changes, you completely lose your vision, you pass out, you have numbness or weakness on one side of body compared to the other or if you have any other new or concerning symptoms.

## 2023-07-23 NOTE — ED Provider Notes (Signed)
[place] EMERGENCY DEPARTMENT AT [facility] Provider Note   CSN: [id number] Arrival date & time: 07/23/23  1638     History  Chief Complaint  Patient presents with   Eye Problem    Jacqueline Ball is a 82 y.o. female.  Patient is an 82 year old female with a past medical history of dementia, diabetes and A-fib on Eliquis presenting to the emergency department with blurred vision and dizziness.  The patient states that this morning she reported that "she could not see".  She states that this seemed like her vision was blurred and also reported "that she could not open her eyelids".  She states that she also felt dizzy but denies feeling like she was passing out or the room was spinning.  She denies any numbness or weakness.  She states that she had some nausea but denies any vomiting.  She states that she was "sick" about a month ago when she was admitted to the hospital and this feels somewhat similar.  She denies any associated headache.  She states that this lasted for about 2 hours and that her vision is now back to normal.  She denies any eye pain.  She denied any spots in her vision.  Was seen at urgent care earlier today and was diagnosed with allergic conjunctivitis but wanted to be reevaluated to "make sure it does not happen again".  I attempted to reach out to family to cooperate story.  I spoke with her husband who states that she has dementia but was not with her earlier today and states that she was with her daughter.  I attempted to call her daughter and phone went to voicemail and unable to leave voicemail as inbox was full.  The history is provided by the patient and the spouse. History limited by: Dementia.  Eye Problem      Home Medications Prior to Admission medications   Medication Sig Start Date End Date Taking? Authorizing Provider  ACCU-CHEK GUIDE test strip 1 each by Other route See admin instructions. USE AS DIRECTED UP TO THREE TIMES DAILY 01/16/21    [provider]  acetaminophen (TYLENOL) 500 MG tablet Take 500 mg by mouth every 6 (six) hours as needed for pain.    [provider]  apixaban (ELIQUIS) 5 MG TABS tablet Take 1 tablet (5 mg total) by mouth 2 (two) times daily. 11/24/14   Rinaldo Cloud, MD  atorvastatin (LIPITOR) 20 MG tablet Take 20 mg by mouth every evening.    [provider]  azelastine (ASTELIN) 0.1 % nasal spray Place 1 spray into both nostrils as needed for rhinitis or allergies.  05/24/18   [provider]  Blood Glucose Monitoring Suppl (ACCU-CHEK GUIDE) w/Device KIT 1 each by Other route See admin instructions. USE AS DIRECTED UP TO THREE TIMES DAILY 01/16/21   [provider]  cephALEXin (KEFLEX) 250 MG capsule Take 1 capsule (250 mg total) by mouth 2 (two) times daily for 7 days. 07/16/23 07/23/23  Carlisle Beers, FNP  cyclobenzaprine (FLEXERIL) 5 MG tablet Take 1-2 tablets (5-10 mg total) by mouth at bedtime. 06/06/20   Wieters, Hallie C, PA-C  diclofenac Sodium (VOLTAREN) 1 % GEL Apply one application (2g) up to four times daily as needed for pain. 05/19/23   Nira Conn, MD  donepezil (ARICEPT) 10 MG tablet Take 1 tablet (10 mg total) by mouth at bedtime. 06/06/17   Marvel Plan, MD  esomeprazole (NEXIUM) 40 MG capsule Take 40  mg by mouth daily at 12 noon.    [provider]  fluconazole (DIFLUCAN) 150 MG tablet Take 150 mg by mouth once.    [provider]  fluticasone (FLONASE) 50 MCG/ACT nasal spray Place 1 spray into both nostrils daily. Patient taking differently: Place 1 spray into both nostrils daily as needed for allergies. 09/15/18   Dahlia Byes A, NP  gabapentin (NEURONTIN) 100 MG capsule Take 100 mg by mouth at bedtime.    [provider]  insulin glargine (LANTUS) 100 UNIT/ML injection Inject 0.12 mLs (12 Units total) into the skin every evening. 05/29/23   Osvaldo Shipper, MD  losartan (COZAAR) 25 MG tablet Take 25 mg by mouth  daily. 05/03/20   [provider]  metFORMIN (GLUCOPHAGE) 500 MG tablet Take 1 tablet (500 mg total) by mouth 2 (two) times daily with a meal. 06/29/22   Charlynne Pander, MD  mupirocin ointment (BACTROBAN) 2 % Apply 1 application. topically 2 (two) times daily. To affected area till better 05/09/22   Zenia Resides, MD  nystatin cream (MYCOSTATIN) Apply to affected area 2 times daily 04/02/20   Dahlia Byes A, NP  ondansetron (ZOFRAN) 4 MG tablet Take 1 tablet (4 mg total) by mouth every 6 (six) hours. 06/26/23   Monna Fam, MD  traMADol (ULTRAM) 50 MG tablet Take 1 tablet (50 mg total) by mouth 2 (two) times daily as needed for severe pain. for pain 11/23/21   Antony Madura, PA-C      Allergies    Metoprolol    Review of Systems   Review of Systems  Physical Exam Updated Vital Signs BP (!) 130/113 (BP Location: Right Arm)   Pulse 77   Temp 97.9 F (36.6 C) (Oral)   Resp (!) 22   SpO2 100%  Physical Exam Vitals and nursing note reviewed.  Constitutional:      General: She is not in acute distress.    Appearance: Normal appearance.  HENT:     Head: Normocephalic and atraumatic.     Nose: Nose normal.     Mouth/Throat:     Mouth: Mucous membranes are moist.     Pharynx: Oropharynx is clear.  Eyes:     Extraocular Movements: Extraocular movements intact.     Conjunctiva/sclera: Conjunctivae normal.     Pupils: Pupils are equal, round, and reactive to light.     Comments: No nystagmus Normal visual fields  Cardiovascular:     Rate and Rhythm: Normal rate and regular rhythm.     Heart sounds: Normal heart sounds.  Pulmonary:     Effort: Pulmonary effort is normal.     Breath sounds: Normal breath sounds.  Abdominal:     General: Abdomen is flat.     Palpations: Abdomen is soft.     Tenderness: There is no abdominal tenderness.  Musculoskeletal:        General: Normal range of motion.     Cervical back: Normal range of motion.  Skin:    General: Skin is warm  and dry.  Neurological:     General: No focal deficit present.     Mental Status: She is alert. Mental status is at baseline.     Cranial Nerves: No cranial nerve deficit.     Sensory: No sensory deficit.     Motor: No weakness.     Coordination: Coordination normal.  Psychiatric:        Mood and Affect: Mood normal.  Behavior: Behavior normal.     ED Results / Procedures / Treatments   Labs (all labs ordered are listed, but only abnormal results are displayed) Labs Reviewed  COMPREHENSIVE METABOLIC PANEL - Abnormal; Notable for the following components:      Result Value   Potassium 3.2 (*)    BUN 5 (*)    All other components within normal limits  CBC WITH DIFFERENTIAL/PLATELET - Abnormal; Notable for the following components:   RBC 3.57 (*)    Hemoglobin 11.2 (*)    HCT 34.6 (*)    All other components within normal limits  URINALYSIS, ROUTINE W REFLEX MICROSCOPIC - Abnormal; Notable for the following components:   Leukocytes,Ua SMALL (*)    Bacteria, UA RARE (*)    All other components within normal limits  CBG MONITORING, ED    EKG EKG Interpretation Date/Time:  Tuesday July 23 2023 18:47:54 EDT Ventricular Rate:  71 PR Interval:  178 QRS Duration:  85 QT Interval:  409 QTC Calculation: 445 R Axis:   46  Text Interpretation: Sinus rhythm No significant change since last tracing Confirmed by Elayne Snare ([id number]) on 07/23/2023 6:49:23 PM  Radiology CT Head Wo Contrast  Result Date: 07/23/2023 CLINICAL DATA:  Neuro deficit, acute, stroke suspected EXAM: CT HEAD WITHOUT CONTRAST TECHNIQUE: Contiguous axial images were obtained from the base of the skull through the vertex without intravenous contrast. RADIATION DOSE REDUCTION: This exam was performed according to the departmental dose-optimization program which includes automated exposure control, adjustment of the mA and/or kV according to patient size and/or use of iterative reconstruction technique.  COMPARISON:  CT head 06/16/2023 FINDINGS: Brain: Stable cerebral ventricle sizes are concordant with the degree of cerebral volume loss. no evidence of large-territorial acute infarction. No parenchymal hemorrhage. No mass lesion. No extra-axial collection. No mass effect or midline shift. No hydrocephalus. Basilar cisterns are patent. Vascular: No hyperdense vessel. Skull: No acute fracture or focal lesion. Sinuses/Orbits: Paranasal sinuses and mastoid air cells are clear. Bilateral lens replacement. Otherwise the orbits are unremarkable. Other: None. IMPRESSION: No acute intracranial abnormality. Electronically Signed   By: Tish Frederickson M.D.   On: 07/23/2023 19:50    Procedures Procedures    Medications Ordered in ED Medications  potassium chloride SA (KLOR-CON M) CR tablet 40 mEq (40 mEq Oral Given 07/23/23 2025)    ED Course/ Medical Decision Making/ A&P Clinical Course as of 07/23/23 2127  Tue Jul 23, 2023  1934 Mild anemia on labs. [VK]  2020 CTH within normal range. Mildly low potassium will be repleted. [VK]  2116 Rare bacteria but 6-10 squams making UTI unlikely. [VK]  2124 Patient remains asymptomatic at this time.  She is stable for discharge home with outpatient primary care follow-up and was given strict return precautions. [VK]    Clinical Course User Index [VK] Rexford Maus, DO                                 Medical Decision Making This patient presents to the ED with chief complaint(s) of dizziness, vision change with pertinent past medical history of dementia, a fib on Eliquis, DM which further complicates the presenting complaint. The complaint involves an extensive differential diagnosis and also carries with it a high risk of complications and morbidity.    The differential diagnosis includes TIA, electrolyte abnormality, hypo or hyperglycemia, arrhythmia, anemia, possible vertigo versus near syncope, vision affected bilateral eyes  and is now back to normal  making a retinal detachment unlikely, no associated pain making acute angle-closure glaucoma unlikely  Additional history obtained: Additional history obtained from family Records reviewed previous admission documents and outpatient urgent care records  ED Course and Reassessment: On patient's arrival to the emergency department she is hemodynamically stable in no acute distress and is asymptomatic at this time without any neurologic deficits.  With patient's associated dizziness will have EKG, labs and head CT performed and she will be closely reassessed.  Independent labs interpretation:  The following labs were independently interpreted: Mild anemia, mild hypokalemia, otherwise within normal range  Independent visualization of imaging: - I independently visualized the following imaging with scope of interpretation limited to determining acute life threatening conditions related to emergency care: CT head, which revealed no acute disease  Consultation: - Consulted or discussed management/test interpretation w/ external professional: N/A  Consideration for admission or further workup: Patient has no emergent conditions requiring admission or further work-up at this time and is stable for discharge home with primary care follow-up  Social Determinants of health: N/A    Amount and/or Complexity of Data Reviewed Labs: ordered. Radiology: ordered.  Risk Prescription drug management.          Final Clinical Impression(s) / ED Diagnoses Final diagnoses:  Dizziness  Hx of blurred vision    Rx / DC Orders ED Discharge Orders     None         Rexford Maus, DO 07/23/23 2127

## 2023-07-23 NOTE — ED Triage Notes (Signed)
Pt presents with changes in her vision.  These have resolved at this time.  Was seen at urgent care ([facility]) earlier and prescribed medicine for conjunctivitis.  Pt states she is better but wanted to get checked ou because she wants some help before it happens again.

## 2023-07-24 ENCOUNTER — Ambulatory Visit (INDEPENDENT_AMBULATORY_CARE_PROVIDER_SITE_OTHER): Payer: Medicare HMO | Admitting: Podiatry

## 2023-07-24 DIAGNOSIS — Z91199 Patient's noncompliance with other medical treatment and regimen due to unspecified reason: Secondary | ICD-10-CM

## 2023-07-24 NOTE — Progress Notes (Signed)
No show

## 2023-08-07 ENCOUNTER — Other Ambulatory Visit: Payer: Self-pay

## 2023-08-07 ENCOUNTER — Emergency Department (HOSPITAL_COMMUNITY)
Admission: EM | Admit: 2023-08-07 | Discharge: 2023-08-07 | Disposition: A | Discharge status: Home / Self Care | Payer: Medicare HMO | Attending: Emergency Medicine | Admitting: Emergency Medicine

## 2023-08-07 ENCOUNTER — Encounter (HOSPITAL_COMMUNITY): Payer: Self-pay

## 2023-08-07 DIAGNOSIS — E119 Type 2 diabetes mellitus without complications: Secondary | ICD-10-CM | POA: Diagnosis not present

## 2023-08-07 DIAGNOSIS — I10 Essential (primary) hypertension: Secondary | ICD-10-CM | POA: Diagnosis not present

## 2023-08-07 DIAGNOSIS — Z7901 Long term (current) use of anticoagulants: Secondary | ICD-10-CM | POA: Insufficient documentation

## 2023-08-07 DIAGNOSIS — Z794 Long term (current) use of insulin: Secondary | ICD-10-CM | POA: Diagnosis not present

## 2023-08-07 DIAGNOSIS — Z79899 Other long term (current) drug therapy: Secondary | ICD-10-CM | POA: Diagnosis not present

## 2023-08-07 DIAGNOSIS — Z7984 Long term (current) use of oral hypoglycemic drugs: Secondary | ICD-10-CM | POA: Insufficient documentation

## 2023-08-07 DIAGNOSIS — L853 Xerosis cutis: Secondary | ICD-10-CM | POA: Insufficient documentation

## 2023-08-07 MED ORDER — BACITRACIN ZINC 500 UNIT/GM EX OINT
TOPICAL_OINTMENT | Freq: Two times a day (BID) | CUTANEOUS | Status: DC
  Administered 2023-08-07: 1 via TOPICAL
  Filled 2023-08-07: qty 0.9

## 2023-08-07 NOTE — ED Provider Notes (Signed)
[place] EMERGENCY DEPARTMENT AT [facility] Provider Note   CSN: [id number] Arrival date & time: 08/07/23  1717     History  Chief Complaint  Patient presents with   feet pain    Jacqueline Ball is a 82 y.o. female.  HPI Patient presents with dry skin on her right foot.  Seen for the same out a month ago.  States it is peeling on her heel.  Also has a crack on the foot.  States there is been a little bit of blood.  No fevers.   Past Medical History:  Diagnosis Date   Anxiety    Atrial fibrillation (HCC)    Diabetes mellitus    Hyperlipidemia    Hypertension    Memory loss    Mood disorder [facility])    Physical exam, annual 10/22/06   Renal cyst     Home Medications Prior to Admission medications   Medication Sig Start Date End Date Taking? Authorizing Provider  ACCU-CHEK GUIDE test strip 1 each by Other route See admin instructions. USE AS DIRECTED UP TO THREE TIMES DAILY 01/16/21   [provider]  acetaminophen (TYLENOL) 500 MG tablet Take 500 mg by mouth every 6 (six) hours as needed for pain.    [provider]  apixaban (ELIQUIS) 5 MG TABS tablet Take 1 tablet (5 mg total) by mouth 2 (two) times daily. 11/24/14   Rinaldo Cloud, MD  atorvastatin (LIPITOR) 20 MG tablet Take 20 mg by mouth every evening.    [provider]  azelastine (ASTELIN) 0.1 % nasal spray Place 1 spray into both nostrils as needed for rhinitis or allergies.  05/24/18   [provider]  Blood Glucose Monitoring Suppl (ACCU-CHEK GUIDE) w/Device KIT 1 each by Other route See admin instructions. USE AS DIRECTED UP TO THREE TIMES DAILY 01/16/21   [provider]  cyclobenzaprine (FLEXERIL) 5 MG tablet Take 1-2 tablets (5-10 mg total) by mouth at bedtime. 06/06/20   Wieters, Hallie C, PA-C  diclofenac Sodium (VOLTAREN) 1 % GEL Apply one application (2g) up to four times daily as needed for pain. 05/19/23   Nira Conn, MD  donepezil (ARICEPT) 10 MG  tablet Take 1 tablet (10 mg total) by mouth at bedtime. 06/06/17   Marvel Plan, MD  esomeprazole (NEXIUM) 40 MG capsule Take 40 mg by mouth daily at 12 noon.    [provider]  fluconazole (DIFLUCAN) 150 MG tablet Take 150 mg by mouth once.    [provider]  fluticasone (FLONASE) 50 MCG/ACT nasal spray Place 1 spray into both nostrils daily. Patient taking differently: Place 1 spray into both nostrils daily as needed for allergies. 09/15/18   Dahlia Byes A, NP  gabapentin (NEURONTIN) 100 MG capsule Take 100 mg by mouth at bedtime.    [provider]  insulin glargine (LANTUS) 100 UNIT/ML injection Inject 0.12 mLs (12 Units total) into the skin every evening. 05/29/23   Osvaldo Shipper, MD  losartan (COZAAR) 25 MG tablet Take 25 mg by mouth daily. 05/03/20   [provider]  metFORMIN (GLUCOPHAGE) 500 MG tablet Take 1 tablet (500 mg total) by mouth 2 (two) times daily with a meal. 06/29/22   Charlynne Pander, MD  mupirocin ointment (BACTROBAN) 2 % Apply 1 application. topically 2 (two) times daily. To affected area till better 05/09/22   Zenia Resides, MD  nystatin cream (MYCOSTATIN) Apply to affected area 2 times daily 04/02/20   Dahlia Byes  A, NP  ondansetron (ZOFRAN) 4 MG tablet Take 1 tablet (4 mg total) by mouth every 6 (six) hours. 06/26/23   Monna Fam, MD  traMADol (ULTRAM) 50 MG tablet Take 1 tablet (50 mg total) by mouth 2 (two) times daily as needed for severe pain. for pain 11/23/21   Antony Madura, PA-C      Allergies    Metoprolol    Review of Systems   Review of Systems  Physical Exam Updated Vital Signs BP (!) 139/91   Pulse 67   Temp 98.5 F (36.9 C)   Resp 18   Ht 5\' 4"  (1.626 m)   Wt 60.1 kg   SpO2 100%   BMI 22.74 kg/m  Physical Exam Vitals and nursing note reviewed.  Skin:    Comments: Heel of right foot does have some peeling superficial layer of skin.  No bleeding at this area.  No erythema.  On the lateral aspect of the  foot there is a crack that does not go very deep but does have some small amount of bleeding.  Neurological:     Mental Status: She is alert.     ED Results / Procedures / Treatments   Labs (all labs ordered are listed, but only abnormal results are displayed) Labs Reviewed - No data to display  EKG None  Radiology No results found.  Procedures Procedures    Medications Ordered in ED Medications  bacitracin ointment (has no administration in time range)    ED Course/ Medical Decision Making/ A&P                                 Medical Decision Making Risk OTC drugs.  Patient with peeling foot also does have a crack.  Does not appear infected.  Ointment may help with the crack to help prevent infection.  Other cream such as CeraVe may help with the dry feet.        Final Clinical Impression(s) / ED Diagnoses Final diagnoses:  Dry skin    Rx / DC Orders ED Discharge Orders     None         Benjiman Core, MD 08/07/23 2126

## 2023-08-07 NOTE — ED Provider Triage Note (Signed)
Emergency Medicine Provider Triage Evaluation Note  Jacqueline Ball , Ball 82 y.o. female  was evaluated in triage.  Pt complains of bilateral feet pain x 1 week.  Patient has peeled skin to the back of the feet.  Denies swelling or color change.  Denies fever at home.  Patient is picking at her callused feet during exam.  Review of Systems  Positive:  Negative:   Physical Exam  BP 118/80   Pulse 72   Temp 98.5 F (36.9 C)   Resp 18   Ht 5\' 4"  (1.626 m)   Wt 60.1 kg   SpO2 100%   BMI 22.74 kg/m  Gen:   Awake, no distress   Resp:  Normal effort  MSK:   Moves extremities without difficulty  Other:  Callused feet noted bilaterally.  No appreciable erythema or drainage noted.  Medical Decision Making  Medically screening exam initiated at 6:47 PM.  Appropriate orders placed.  Jacqueline Ball was informed that the remainder of the evaluation will be completed by another provider, this initial triage assessment does not replace that evaluation, and the importance of remaining in the ED until their evaluation is complete.  Work up initiated.    Jacqueline Jacqueline A, PA-C 08/07/23 1857

## 2023-08-07 NOTE — ED Triage Notes (Signed)
Pt c/o feet pain bilatx1wk. Pt has peeled skin on posterior of feet bilat. Pt's feet do not appear to be infected.

## 2023-08-07 NOTE — Discharge Instructions (Signed)
Lotion such as CeraVe a may help with the peeling of the skin.  The antibiotic ointment may help with the wound.

## 2023-08-09 ENCOUNTER — Encounter (HOSPITAL_COMMUNITY): Payer: Self-pay

## 2023-08-09 ENCOUNTER — Other Ambulatory Visit: Payer: Self-pay

## 2023-08-09 ENCOUNTER — Emergency Department (HOSPITAL_COMMUNITY)
Admission: EM | Admit: 2023-08-09 | Discharge: 2023-08-09 | Disposition: A | Discharge status: Home / Self Care | Payer: Medicare HMO | Attending: Emergency Medicine | Admitting: Emergency Medicine

## 2023-08-09 DIAGNOSIS — D649 Anemia, unspecified: Secondary | ICD-10-CM | POA: Insufficient documentation

## 2023-08-09 DIAGNOSIS — I10 Essential (primary) hypertension: Secondary | ICD-10-CM | POA: Insufficient documentation

## 2023-08-09 DIAGNOSIS — E876 Hypokalemia: Secondary | ICD-10-CM

## 2023-08-09 DIAGNOSIS — R42 Dizziness and giddiness: Secondary | ICD-10-CM | POA: Diagnosis not present

## 2023-08-09 DIAGNOSIS — Z7984 Long term (current) use of oral hypoglycemic drugs: Secondary | ICD-10-CM | POA: Insufficient documentation

## 2023-08-09 DIAGNOSIS — G309 Alzheimer's disease, unspecified: Secondary | ICD-10-CM | POA: Diagnosis not present

## 2023-08-09 DIAGNOSIS — E119 Type 2 diabetes mellitus without complications: Secondary | ICD-10-CM | POA: Insufficient documentation

## 2023-08-09 DIAGNOSIS — Z7901 Long term (current) use of anticoagulants: Secondary | ICD-10-CM | POA: Diagnosis not present

## 2023-08-09 DIAGNOSIS — Z794 Long term (current) use of insulin: Secondary | ICD-10-CM | POA: Diagnosis not present

## 2023-08-09 DIAGNOSIS — Z79899 Other long term (current) drug therapy: Secondary | ICD-10-CM | POA: Insufficient documentation

## 2023-08-09 LAB — BASIC METABOLIC PANEL
Anion gap: 8 (ref 5–15)
BUN: 9 mg/dL (ref 8–23)
CO2: 23 mmol/L (ref 22–32)
Calcium: 8.7 mg/dL — ABNORMAL LOW (ref 8.9–10.3)
Chloride: 107 mmol/L (ref 98–111)
Creatinine, Ser: 0.89 mg/dL (ref 0.44–1.00)
GFR, Estimated: >60 mL/min (ref >60)
Glucose, Bld: 85 mg/dL (ref 70–99)
Potassium: 3.3 mmol/L — ABNORMAL LOW (ref 3.5–5.1)
Sodium: 138 mmol/L (ref 135–145)

## 2023-08-09 LAB — CBC
HCT: 33.4 % — ABNORMAL LOW (ref 36.0–46.0)
Hemoglobin: 10.9 g/dL — ABNORMAL LOW (ref 12.0–15.0)
MCH: 32.3 pg (ref 26.0–34.0)
MCHC: 32.6 g/dL (ref 30.0–36.0)
MCV: 99.1 fL (ref 80.0–100.0)
Platelets: 341 10*3/uL (ref 150–400)
RBC: 3.37 MIL/uL — ABNORMAL LOW (ref 3.87–5.11)
RDW: 12.7 % (ref 11.5–15.5)
WBC: 8.5 10*3/uL (ref 4.0–10.5)
nRBC: 0 % (ref 0.0–0.2)

## 2023-08-09 MED ORDER — POTASSIUM CHLORIDE CRYS ER 20 MEQ PO TBCR
40.0000 meq | EXTENDED_RELEASE_TABLET | Freq: Once | ORAL | Status: AC
  Administered 2023-08-09: 40 meq via ORAL
  Filled 2023-08-09: qty 2

## 2023-08-09 NOTE — ED Triage Notes (Signed)
Pt coming in today complaining of intermittent dizziness that has been going on for a couple of days. Pt says "maybe a little bit" when asked if she has any nausea.

## 2023-08-09 NOTE — ED Provider Notes (Signed)
[place] EMERGENCY DEPARTMENT AT [facility] Provider Note   CSN: [id number] Arrival date & time: 08/09/23  1500     History  Chief Complaint  Patient presents with   Dizziness    Jacqueline Ball is a 82 y.o. female past medical history significant for hypertension, diabetes, chronic A-fib, Alzheimer's, frequent emergency department visits who presents with concern for 15 minutes of dizziness.  Patient reports that it has gone intermittently for a few days.  When reviewing her chart she has been seen multiple times in the past for similar.  She denies any chest pain, shortness of breath, nausea, vomiting, vision changes, numbness, weakness.  Patient reports at this time the dizziness has resolved but she "wants to make sure she is okay".   Dizziness      Home Medications Prior to Admission medications   Medication Sig Start Date End Date Taking? Authorizing Provider  ACCU-CHEK GUIDE test strip 1 each by Other route See admin instructions. USE AS DIRECTED UP TO THREE TIMES DAILY 01/16/21   [provider]  acetaminophen (TYLENOL) 500 MG tablet Take 500 mg by mouth every 6 (six) hours as needed for pain.    [provider]  apixaban (ELIQUIS) 5 MG TABS tablet Take 1 tablet (5 mg total) by mouth 2 (two) times daily. 11/24/14   Rinaldo Cloud, MD  atorvastatin (LIPITOR) 20 MG tablet Take 20 mg by mouth every evening.    [provider]  azelastine (ASTELIN) 0.1 % nasal spray Place 1 spray into both nostrils as needed for rhinitis or allergies.  05/24/18   [provider]  Blood Glucose Monitoring Suppl (ACCU-CHEK GUIDE) w/Device KIT 1 each by Other route See admin instructions. USE AS DIRECTED UP TO THREE TIMES DAILY 01/16/21   [provider]  cyclobenzaprine (FLEXERIL) 5 MG tablet Take 1-2 tablets (5-10 mg total) by mouth at bedtime. 06/06/20   Wieters, Hallie C, PA-C  diclofenac Sodium (VOLTAREN) 1 % GEL Apply one application (2g) up to  four times daily as needed for pain. 05/19/23   Nira Conn, MD  donepezil (ARICEPT) 10 MG tablet Take 1 tablet (10 mg total) by mouth at bedtime. 06/06/17   Marvel Plan, MD  esomeprazole (NEXIUM) 40 MG capsule Take 40 mg by mouth daily at 12 noon.    [provider]  fluconazole (DIFLUCAN) 150 MG tablet Take 150 mg by mouth once.    [provider]  fluticasone (FLONASE) 50 MCG/ACT nasal spray Place 1 spray into both nostrils daily. Patient taking differently: Place 1 spray into both nostrils daily as needed for allergies. 09/15/18   Dahlia Byes A, NP  gabapentin (NEURONTIN) 100 MG capsule Take 100 mg by mouth at bedtime.    [provider]  insulin glargine (LANTUS) 100 UNIT/ML injection Inject 0.12 mLs (12 Units total) into the skin every evening. 05/29/23   Osvaldo Shipper, MD  losartan (COZAAR) 25 MG tablet Take 25 mg by mouth daily. 05/03/20   [provider]  metFORMIN (GLUCOPHAGE) 500 MG tablet Take 1 tablet (500 mg total) by mouth 2 (two) times daily with a meal. 06/29/22   Charlynne Pander, MD  mupirocin ointment (BACTROBAN) 2 % Apply 1 application. topically 2 (two) times daily. To affected area till better 05/09/22   Zenia Resides, MD  nystatin cream (MYCOSTATIN) Apply to affected area 2 times daily 04/02/20   Dahlia Byes A, NP  ondansetron (ZOFRAN) 4 MG tablet Take 1 tablet (4 mg  total) by mouth every 6 (six) hours. 06/26/23   Monna Fam, MD  traMADol (ULTRAM) 50 MG tablet Take 1 tablet (50 mg total) by mouth 2 (two) times daily as needed for severe pain. for pain 11/23/21   Antony Madura, PA-C      Allergies    Metoprolol    Review of Systems   Review of Systems  Neurological:  Positive for dizziness.  All other systems reviewed and are negative.   Physical Exam Updated Vital Signs BP 124/69   Pulse 72   Temp 98.1 F (36.7 C) (Oral)   Resp 18   Ht 5\' 4"  (1.626 m)   Wt 61.2 kg   SpO2 99%   BMI 23.17 kg/m  Physical  Exam Vitals and nursing note reviewed.  Constitutional:      General: She is not in acute distress.    Appearance: Normal appearance.  HENT:     Head: Normocephalic and atraumatic.  Eyes:     General:        Right eye: No discharge.        Left eye: No discharge.  Cardiovascular:     Rate and Rhythm: Normal rate and regular rhythm.     Heart sounds: No murmur heard.    No friction rub. No gallop.  Pulmonary:     Effort: Pulmonary effort is normal.     Breath sounds: Normal breath sounds.  Abdominal:     General: Bowel sounds are normal.     Palpations: Abdomen is soft.  Skin:    General: Skin is warm and dry.     Capillary Refill: Capillary refill takes less than 2 seconds.  Neurological:     Mental Status: She is alert and oriented to person, place, and time.     Comments: Cranial nerves II through XII grossly intact.  Intact finger-nose, intact heel-to-shin.  Romberg negative, gait normal.  Alert and oriented to baseline. Knows, name, situation, place, somewhat confused on time.  Moves all 4 limbs spontaneously, normal coordination.  No pronator drift.  Intact strength 5 out of 5 bilateral upper and lower extremities.    Psychiatric:        Mood and Affect: Mood normal.        Behavior: Behavior normal.     ED Results / Procedures / Treatments   Labs (all labs ordered are listed, but only abnormal results are displayed) Labs Reviewed  CBC - Abnormal; Notable for the following components:      Result Value   RBC 3.37 (*)    Hemoglobin 10.9 (*)    HCT 33.4 (*)    All other components within normal limits  BASIC METABOLIC PANEL - Abnormal; Notable for the following components:   Potassium 3.3 (*)    Calcium 8.7 (*)    All other components within normal limits    EKG None  Radiology No results found.  Procedures Procedures    Medications Ordered in ED Medications  potassium chloride SA (KLOR-CON M) CR tablet 40 mEq (40 mEq Oral Given 08/09/23 1754)    ED  Course/ Medical Decision Making/ A&P                                 Medical Decision Making Amount and/or Complexity of Data Reviewed Labs: ordered.   This patient is a 82 y.o. female  who presents to the ED for concern of dizziness.  Differential diagnoses prior to evaluation: The emergent differential diagnosis includes, but is not limited to,  BPPV, vestibular migraine, head trauma, AVM, intracranial tumor, multiple sclerosis, drug-related, CVA, vasovagal syncope, orthostatic hypotension, sepsis, hypoglycemia, electrolyte disturbance, anemia, anxiety/panic attack . This is not an exhaustive differential.   Past Medical History / Co-morbidities / Social History: hypertension, diabetes, chronic A-fib, Alzheimer's, frequent emergency department visits   Additional history: Chart reviewed. Pertinent results include: reviewed labwork, imaging from from recent previous emergency department visits  Physical Exam: Physical exam performed. The pertinent findings include: Cranial nerves II through XII grossly intact.  Intact finger-nose, intact heel-to-shin.  Romberg negative, gait normal.  Alert and oriented to baseline. Knows, name, situation, place, somewhat confused on time.  Moves all 4 limbs spontaneously, normal coordination.  No pronator drift.  Intact strength 5 out of 5 bilateral upper and lower extremities.  No nystagmus noted, dizziness does not seem positional in nature  Lab Tests/Imaging studies: I personally interpreted labs/imaging and the pertinent results include: CBC notable for mild anemia, not significantly changed from baseline but is mildly downtrending, encourage close PCP recheck.  BMP is notable for mild hypokalemia potassium 3.3, we will orally replete.  Cardiac monitoring: EKG obtained and interpreted by myself and attending physician which shows: NSR   Medications: I ordered medication including kclor for potassium.  I have reviewed the patients home medicines  and have made adjustments as needed.   Disposition: After consideration of the diagnostic results and the patients response to treatment, I feel that patient is stable for discharge at this time, no longer dizzy, no emergent cause for dizziness identified .   emergency department workup does not suggest an emergent condition requiring admission or immediate intervention beyond what has been performed at this time. The plan is: as above. The patient is safe for discharge and has been instructed to return immediately for worsening symptoms, change in symptoms or any other concerns.  Final Clinical Impression(s) / ED Diagnoses Final diagnoses:  Dizziness  Hypokalemia    Rx / DC Orders ED Discharge Orders     None         Olene Floss, PA-C 08/09/23 1759    Loetta Rough, MD 08/09/23 2352

## 2023-08-12 DIAGNOSIS — M79672 Pain in left foot: Secondary | ICD-10-CM | POA: Diagnosis not present

## 2023-08-12 DIAGNOSIS — M79671 Pain in right foot: Secondary | ICD-10-CM | POA: Diagnosis not present

## 2023-08-12 DIAGNOSIS — R202 Paresthesia of skin: Secondary | ICD-10-CM | POA: Diagnosis not present

## 2023-08-13 ENCOUNTER — Ambulatory Visit (HOSPITAL_COMMUNITY)
Admission: EM | Admit: 2023-08-13 | Discharge: 2023-08-13 | Disposition: A | Discharge status: Home / Self Care | Payer: Medicare HMO | Attending: Family Medicine | Admitting: Family Medicine

## 2023-08-13 DIAGNOSIS — L853 Xerosis cutis: Secondary | ICD-10-CM | POA: Diagnosis not present

## 2023-08-13 MED ORDER — MUPIROCIN 2 % EX OINT: 1.0000 | TOPICAL_OINTMENT | Freq: Two times a day (BID) | CUTANEOUS | 0 refills | Status: DC

## 2023-08-13 NOTE — ED Triage Notes (Signed)
Pt presents with c/o bilateral foot pain. States she has been soaking her feet. States she feels her feet have been cracked.

## 2023-08-13 NOTE — Discharge Instructions (Signed)
Put mupirocin ointment on the sore areas twice daily until improved   Also apply Vaseline or petroleum jelly to the dry areas on your feet 2 times a day.  Follow-up with your primary care about this issue

## 2023-08-13 NOTE — ED Provider Notes (Signed)
[facility]    CSN: [id number] Arrival date & time: 08/13/23  1530      History   Chief Complaint Chief Complaint  Patient presents with   Foot Pain    HPI Jacqueline Ball is a 82 y.o. female.    Foot Pain  Here for burning pain and peeling skin of her feet. No new lotions/soaps/chemicals. She does clean the shower with bleach  All began about a week ago  Past Medical History:  Diagnosis Date   Anxiety    Atrial fibrillation (HCC)    Diabetes mellitus    Hyperlipidemia    Hypertension    Memory loss    Mood disorder (HCC)    Physical exam, annual 10/22/06   Renal cyst     Patient Active Problem List   Diagnosis Date Noted   Dizziness and giddiness 06/16/2023   Hypomagnesemia 05/29/2023   UTI (urinary tract infection) 05/29/2023   Malnutrition of moderate degree 05/29/2023   Hypoglycemia 05/28/2023   Shortness of breath 05/28/2023   Acute midline low back pain without sciatica 11/29/2019   GERD (gastroesophageal reflux disease) 05/12/2019   Arthritis 05/12/2019   Dementia (HCC) 05/12/2019   Hyperlipidemia 05/12/2019   Overactive bladder 05/12/2019   Chronic anticoagulation 03/18/2018   Late onset Alzheimer's 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      Home Medications    Prior to Admission medications   Medication Sig Start Date End Date Taking?  Authorizing Provider  mupirocin ointment (BACTROBAN) 2 % Apply 1 Application topically 2 (two) times daily. To affected area till better 08/13/23  Yes Raha Tennison, Janace Aris, MD  ACCU-CHEK GUIDE test strip 1 each by Other route See admin instructions. USE AS DIRECTED UP TO THREE TIMES DAILY 01/16/21   [provider]  acetaminophen (TYLENOL) 500 MG tablet Take 500 mg by mouth every 6 (six) hours as needed for pain.    [provider]  apixaban (ELIQUIS) 5 MG TABS tablet Take 1 tablet (5 mg total) by mouth 2 (two) times daily. 11/24/14   Rinaldo Cloud, MD  atorvastatin (LIPITOR) 20 MG tablet Take 20 mg by mouth every evening.    [provider]  azelastine (ASTELIN) 0.1 % nasal spray Place 1 spray into both nostrils as needed for rhinitis or allergies.  05/24/18   [provider]  Blood Glucose Monitoring Suppl (ACCU-CHEK GUIDE) w/Device KIT 1 each by Other route See admin instructions. USE AS DIRECTED UP TO THREE TIMES DAILY 01/16/21   [provider]  cyclobenzaprine (FLEXERIL) 5 MG tablet Take 1-2 tablets (5-10 mg total) by mouth at bedtime. 06/06/20   Wieters, Hallie C, PA-C  diclofenac Sodium (VOLTAREN) 1 % GEL Apply one application (2g) up to four times daily as needed for pain. 05/19/23   Nira Conn, MD  donepezil (ARICEPT) 10 MG tablet Take 1 tablet (10 mg total) by mouth at bedtime. 06/06/17  Marvel Plan, MD  esomeprazole (NEXIUM) 40 MG capsule Take 40 mg by mouth daily at 12 noon.    [provider]  fluconazole (DIFLUCAN) 150 MG tablet Take 150 mg by mouth once.    [provider]  fluticasone (FLONASE) 50 MCG/ACT nasal spray Place 1 spray into both nostrils daily. Patient taking differently: Place 1 spray into both nostrils daily as needed for allergies. 09/15/18   Dahlia Byes A, NP  gabapentin (NEURONTIN) 100 MG capsule Take 100 mg by mouth at bedtime.    [provider]  insulin glargine (LANTUS) 100 UNIT/ML injection  Inject 0.12 mLs (12 Units total) into the skin every evening. 05/29/23   Osvaldo Shipper, MD  losartan (COZAAR) 25 MG tablet Take 25 mg by mouth daily. 05/03/20   [provider]  metFORMIN (GLUCOPHAGE) 500 MG tablet Take 1 tablet (500 mg total) by mouth 2 (two) times daily with a meal. 06/29/22   Charlynne Pander, MD  nystatin cream (MYCOSTATIN) Apply to affected area 2 times daily 04/02/20   Dahlia Byes A, NP  ondansetron (ZOFRAN) 4 MG tablet Take 1 tablet (4 mg total) by mouth every 6 (six) hours. 06/26/23   Monna Fam, MD  traMADol (ULTRAM) 50 MG tablet Take 1 tablet (50 mg total) by mouth 2 (two) times daily as needed for severe pain. for pain 11/23/21   Antony Madura, PA-C    Family History Family History  Problem Relation Age of Onset   Diabetes Sister     Social History Social History   Tobacco Use   Smoking status: Never   Smokeless tobacco: Never  Vaping Use   Vaping status: Never Used  Substance Use Topics   Alcohol use: Never   Drug use: Never     Allergies   Metoprolol   Review of Systems Review of Systems   Physical Exam Triage Vital Signs ED Triage Vitals  Encounter Vitals Group     BP 08/13/23 1631 119/81     Systolic BP Percentile --      Diastolic BP Percentile --      Pulse Rate 08/13/23 1631 85     Resp 08/13/23 1631 17     Temp 08/13/23 1631 98 F (36.7 C)     Temp Source 08/13/23 1631 Oral     SpO2 08/13/23 1631 99 %     Weight --      Height --      Head Circumference --      Peak Flow --      Pain Score 08/13/23 1629 6     Pain Loc --      Pain Education --      Exclude from Growth Chart --    No data found.  Updated Vital Signs BP 119/81 (BP Location: Right Arm)   Pulse 85   Temp 98 F (36.7 C) (Oral)   Resp 17   SpO2 99%   Visual Acuity Right Eye Distance:   Left Eye Distance:   Bilateral Distance:    Right Eye Near:   Left Eye Near:    Bilateral Near:     Physical Exam Vitals reviewed.  Constitutional:       General: She is not in acute distress.    Appearance: She is not ill-appearing, toxic-appearing or diaphoretic.  Skin:    Coloration: Skin is not pale.     Comments: The skin on the heels of her feet is peeling some.  There is a  couple of places where there is a small abrasion.  There is no induration or drainage.  No erythema.  No swelling    Neurological:     Mental Status: She is alert and oriented to person, place, and time.  Psychiatric:        Behavior: Behavior normal.      UC Treatments / Results  Labs (all labs ordered are listed, but only abnormal results are displayed) Labs Reviewed - No data to display  EKG   Radiology No results found.  Procedures Procedures (including critical care time)  Medications Ordered in UC Medications - No data to display  Initial Impression / Assessment and Plan / UC Course  I have reviewed the triage vital signs and the nursing notes.  Pertinent labs & imaging results that were available during my care of the patient were reviewed by me and considered in my medical decision making (see chart for details).        Bactroban is sent in to apply to the open spots. She will use petroleum jelly on the dry skin, and I have asked her to follow-up with primary care about this issue Final Clinical Impressions(s) / UC Diagnoses   Final diagnoses:  Dry skin     Discharge Instructions      Put mupirocin ointment on the sore areas twice daily until improved   Also apply Vaseline or petroleum jelly to the dry areas on your feet 2 times a day.  Follow-up with your primary care about this issue     ED Prescriptions     Medication Sig Dispense Auth. Provider   mupirocin ointment (BACTROBAN) 2 % Apply 1 Application topically 2 (two) times daily. To affected area till better 22 g Marlinda Mike Janace Aris, MD      PDMP not reviewed this encounter.   Zenia Resides, MD 08/13/23 1700

## 2023-08-20 ENCOUNTER — Other Ambulatory Visit: Payer: Self-pay

## 2023-08-20 ENCOUNTER — Emergency Department (HOSPITAL_COMMUNITY)
Admission: EM | Admit: 2023-08-20 | Discharge: 2023-08-20 | Disposition: A | Discharge status: Home / Self Care | Payer: Medicare HMO | Attending: Emergency Medicine | Admitting: Emergency Medicine

## 2023-08-20 ENCOUNTER — Encounter (HOSPITAL_COMMUNITY): Payer: Self-pay

## 2023-08-20 DIAGNOSIS — Z7901 Long term (current) use of anticoagulants: Secondary | ICD-10-CM | POA: Insufficient documentation

## 2023-08-20 DIAGNOSIS — R6883 Chills (without fever): Secondary | ICD-10-CM | POA: Diagnosis not present

## 2023-08-20 DIAGNOSIS — Z794 Long term (current) use of insulin: Secondary | ICD-10-CM | POA: Diagnosis not present

## 2023-08-20 DIAGNOSIS — R509 Fever, unspecified: Secondary | ICD-10-CM | POA: Diagnosis not present

## 2023-08-20 LAB — CBC WITH DIFFERENTIAL/PLATELET
Abs Immature Granulocytes: 0.02 10*3/uL (ref 0.00–0.07)
Basophils Absolute: 0 10*3/uL (ref 0.0–0.1)
Basophils Relative: 0 %
Eosinophils Absolute: 0 10*3/uL (ref 0.0–0.5)
Eosinophils Relative: 0 %
HCT: 36.3 % (ref 36.0–46.0)
Hemoglobin: 12 g/dL (ref 12.0–15.0)
Immature Granulocytes: 0 %
Lymphocytes Relative: 32 %
Lymphs Abs: 2.3 10*3/uL (ref 0.7–4.0)
MCH: 33 pg (ref 26.0–34.0)
MCHC: 33.1 g/dL (ref 30.0–36.0)
MCV: 99.7 fL (ref 80.0–100.0)
Monocytes Absolute: 0.6 10*3/uL (ref 0.1–1.0)
Monocytes Relative: 8 %
Neutro Abs: 4.4 10*3/uL (ref 1.7–7.7)
Neutrophils Relative %: 60 %
Platelets: 358 10*3/uL (ref 150–400)
RBC: 3.64 MIL/uL — ABNORMAL LOW (ref 3.87–5.11)
RDW: 12.5 % (ref 11.5–15.5)
WBC: 7.4 10*3/uL (ref 4.0–10.5)
nRBC: 0 % (ref 0.0–0.2)

## 2023-08-20 LAB — BASIC METABOLIC PANEL
Anion gap: 12 (ref 5–15)
BUN: 14 mg/dL (ref 8–23)
CO2: 24 mmol/L (ref 22–32)
Calcium: 9.9 mg/dL (ref 8.9–10.3)
Chloride: 102 mmol/L (ref 98–111)
Creatinine, Ser: 0.78 mg/dL (ref 0.44–1.00)
GFR, Estimated: >60 mL/min (ref >60)
Glucose, Bld: 123 mg/dL — ABNORMAL HIGH (ref 70–99)
Potassium: 4 mmol/L (ref 3.5–5.1)
Sodium: 138 mmol/L (ref 135–145)

## 2023-08-20 NOTE — ED Provider Notes (Signed)
[place] EMERGENCY DEPARTMENT AT [facility] Provider Note   CSN: [id number] Arrival date & time: 08/20/23  1540     History  No chief complaint on file.   Jacqueline Ball is a 82 y.o. female here presenting with chills.  Patient states that she has a subjective fever for the last several days.  Patient never took her temperature.  Denies any cough or abdominal pain or urinary symptoms.  Patient was seen several times last week for dizziness and weakness and had a normal CT head several days ago.   The history is provided by the patient.       Home Medications Prior to Admission medications   Medication Sig Start Date End Date Taking? Authorizing Provider  ACCU-CHEK GUIDE test strip 1 each by Other route See admin instructions. USE AS DIRECTED UP TO THREE TIMES DAILY 01/16/21   [provider]  acetaminophen (TYLENOL) 500 MG tablet Take 500 mg by mouth every 6 (six) hours as needed for pain.    [provider]  apixaban (ELIQUIS) 5 MG TABS tablet Take 1 tablet (5 mg total) by mouth 2 (two) times daily. 11/24/14   Rinaldo Cloud, MD  atorvastatin (LIPITOR) 20 MG tablet Take 20 mg by mouth every evening.    [provider]  azelastine (ASTELIN) 0.1 % nasal spray Place 1 spray into both nostrils as needed for rhinitis or allergies.  05/24/18   [provider]  Blood Glucose Monitoring Suppl (ACCU-CHEK GUIDE) w/Device KIT 1 each by Other route See admin instructions. USE AS DIRECTED UP TO THREE TIMES DAILY 01/16/21   [provider]  cyclobenzaprine (FLEXERIL) 5 MG tablet Take 1-2 tablets (5-10 mg total) by mouth at bedtime. 06/06/20   Wieters, Hallie C, PA-C  diclofenac Sodium (VOLTAREN) 1 % GEL Apply one application (2g) up to four times daily as needed for pain. 05/19/23   Nira Conn, MD  donepezil (ARICEPT) 10 MG tablet Take 1 tablet (10 mg total) by mouth at bedtime. 06/06/17   Marvel Plan, MD  esomeprazole (NEXIUM) 40 MG  capsule Take 40 mg by mouth daily at 12 noon.    [provider]  fluconazole (DIFLUCAN) 150 MG tablet Take 150 mg by mouth once.    [provider]  fluticasone (FLONASE) 50 MCG/ACT nasal spray Place 1 spray into both nostrils daily. Patient taking differently: Place 1 spray into both nostrils daily as needed for allergies. 09/15/18   Dahlia Byes A, NP  gabapentin (NEURONTIN) 100 MG capsule Take 100 mg by mouth at bedtime.    [provider]  insulin glargine (LANTUS) 100 UNIT/ML injection Inject 0.12 mLs (12 Units total) into the skin every evening. 05/29/23   Osvaldo Shipper, MD  losartan (COZAAR) 25 MG tablet Take 25 mg by mouth daily. 05/03/20   [provider]  metFORMIN (GLUCOPHAGE) 500 MG tablet Take 1 tablet (500 mg total) by mouth 2 (two) times daily with a meal. 06/29/22   Charlynne Pander, MD  mupirocin ointment (BACTROBAN) 2 % Apply 1 Application topically 2 (two) times daily. To affected area till better 08/13/23   Zenia Resides, MD  nystatin cream (MYCOSTATIN) Apply to affected area 2 times daily 04/02/20   Dahlia Byes A, NP  ondansetron (ZOFRAN) 4 MG tablet Take 1 tablet (4 mg total) by mouth every 6 (six) hours. 06/26/23   Monna Fam, MD  traMADol (ULTRAM) 50 MG tablet Take 1 tablet (50 mg total) by mouth 2 (  two) times daily as needed for severe pain. for pain 11/23/21   Antony Madura, PA-C      Allergies    Metoprolol    Review of Systems   Review of Systems  Constitutional:  Positive for chills.  All other systems reviewed and are negative.   Physical Exam Updated Vital Signs BP (!) 141/107 (BP Location: Left Arm)   Pulse (!) 58   Temp 97.6 F (36.4 C) (Oral)   Resp 16   Ht 5\' 4"  (1.626 m)   Wt 61.2 kg   SpO2 100%   BMI 23.16 kg/m  Physical Exam Vitals and nursing note reviewed.  Constitutional:      Comments: Well-appearing  HENT:     Head: Normocephalic.     Nose: Nose normal.     Mouth/Throat:     Mouth: Mucous  membranes are moist.  Eyes:     Extraocular Movements: Extraocular movements intact.     Pupils: Pupils are equal, round, and reactive to light.  Cardiovascular:     Rate and Rhythm: Normal rate and regular rhythm.     Pulses: Normal pulses.     Heart sounds: Normal heart sounds.  Pulmonary:     Effort: Pulmonary effort is normal.     Breath sounds: Normal breath sounds.  Abdominal:     General: Abdomen is flat.     Palpations: Abdomen is soft.  Musculoskeletal:        General: Normal range of motion.     Cervical back: Normal range of motion and neck supple.  Skin:    General: Skin is warm.     Capillary Refill: Capillary refill takes less than 2 seconds.  Neurological:     General: No focal deficit present.     Mental Status: She is oriented to person, place, and time.     Comments: Cranial nerves II to XII intact.  Patient has normal strength bilateral arms and legs.  Patient has normal gait.  Psychiatric:        Mood and Affect: Mood normal.        Behavior: Behavior normal.     ED Results / Procedures / Treatments   Labs (all labs ordered are listed, but only abnormal results are displayed) Labs Reviewed  BASIC METABOLIC PANEL - Abnormal; Notable for the following components:      Result Value   Glucose, Bld 123 (*)    All other components within normal limits  CBC WITH DIFFERENTIAL/PLATELET - Abnormal; Notable for the following components:   RBC 3.64 (*)    All other components within normal limits    EKG None  Radiology No results found.  Procedures Procedures    Medications Ordered in ED Medications - No data to display  ED Course/ Medical Decision Making/ A&P                                 Medical Decision Making Jacqueline Ball is a 82 y.o. female here presenting with chills.  Patient is afebrile.  Patient is well-appearing.  Patient has no cough or urinary symptoms and abdomen is nontender.  CBC and BMP reviewed and they were unremarkable.  Patient  likely has viral syndrome.  Stable for discharge   Problems Addressed: Chills: acute illness or injury  Amount and/or Complexity of Data Reviewed Labs: ordered. Decision-making details documented in ED Course.    Final Clinical Impression(s) / ED  Diagnoses Final diagnoses:  None    Rx / DC Orders ED Discharge Orders     None         Charlynne Pander, MD 08/20/23 2213

## 2023-08-20 NOTE — ED Triage Notes (Signed)
Pt c/o sweatingx1wk. Pt states she never took her temp

## 2023-08-20 NOTE — Discharge Instructions (Signed)
As we discussed, your labs were unremarkable today.  If you have chills you may take Tylenol or Motrin as needed.  Please see your doctor for follow-up  Return to ER if you have fever > 101, weakness or numbness or trouble walking

## 2023-08-21 DIAGNOSIS — E1142 Type 2 diabetes mellitus with diabetic polyneuropathy: Secondary | ICD-10-CM | POA: Diagnosis not present

## 2023-08-21 DIAGNOSIS — Z794 Long term (current) use of insulin: Secondary | ICD-10-CM | POA: Diagnosis not present

## 2023-08-23 ENCOUNTER — Encounter (HOSPITAL_COMMUNITY): Payer: Self-pay | Admitting: Emergency Medicine

## 2023-08-23 ENCOUNTER — Emergency Department (HOSPITAL_COMMUNITY): Payer: Medicare HMO

## 2023-08-23 ENCOUNTER — Other Ambulatory Visit: Payer: Self-pay

## 2023-08-23 ENCOUNTER — Emergency Department (HOSPITAL_COMMUNITY)
Admission: EM | Admit: 2023-08-23 | Discharge: 2023-08-23 | Disposition: A | Discharge status: Home / Self Care | Payer: Medicare HMO | Attending: Emergency Medicine | Admitting: Emergency Medicine

## 2023-08-23 DIAGNOSIS — R509 Fever, unspecified: Secondary | ICD-10-CM | POA: Insufficient documentation

## 2023-08-23 DIAGNOSIS — Z7901 Long term (current) use of anticoagulants: Secondary | ICD-10-CM | POA: Diagnosis not present

## 2023-08-23 DIAGNOSIS — R531 Weakness: Secondary | ICD-10-CM | POA: Diagnosis not present

## 2023-08-23 DIAGNOSIS — Z1152 Encounter for screening for COVID-19: Secondary | ICD-10-CM | POA: Diagnosis not present

## 2023-08-23 DIAGNOSIS — Z794 Long term (current) use of insulin: Secondary | ICD-10-CM | POA: Diagnosis not present

## 2023-08-23 DIAGNOSIS — M791 Myalgia, unspecified site: Secondary | ICD-10-CM | POA: Diagnosis not present

## 2023-08-23 LAB — CBC WITH DIFFERENTIAL/PLATELET
Abs Immature Granulocytes: 0.01 10*3/uL (ref 0.00–0.07)
Basophils Absolute: 0 10*3/uL (ref 0.0–0.1)
Basophils Relative: 0 %
Eosinophils Absolute: 0 10*3/uL (ref 0.0–0.5)
Eosinophils Relative: 0 %
HCT: 37.7 % (ref 36.0–46.0)
Hemoglobin: 12.3 g/dL (ref 12.0–15.0)
Immature Granulocytes: 0 %
Lymphocytes Relative: 37 %
Lymphs Abs: 2.8 10*3/uL (ref 0.7–4.0)
MCH: 32.4 pg (ref 26.0–34.0)
MCHC: 32.6 g/dL (ref 30.0–36.0)
MCV: 99.2 fL (ref 80.0–100.0)
Monocytes Absolute: 0.7 10*3/uL (ref 0.1–1.0)
Monocytes Relative: 9 %
Neutro Abs: 4.1 10*3/uL (ref 1.7–7.7)
Neutrophils Relative %: 54 %
Platelets: 361 10*3/uL (ref 150–400)
RBC: 3.8 MIL/uL — ABNORMAL LOW (ref 3.87–5.11)
RDW: 12.5 % (ref 11.5–15.5)
WBC: 7.5 10*3/uL (ref 4.0–10.5)
nRBC: 0 % (ref 0.0–0.2)

## 2023-08-23 LAB — URINALYSIS, W/ REFLEX TO CULTURE (INFECTION SUSPECTED)
Bilirubin Urine: NEGATIVE
Glucose, UA: NEGATIVE mg/dL
Hgb urine dipstick: NEGATIVE
Ketones, ur: NEGATIVE mg/dL
Nitrite: NEGATIVE
Protein, ur: NEGATIVE mg/dL
Specific Gravity, Urine: 1.016 (ref 1.005–1.030)
pH: 5 (ref 5.0–8.0)

## 2023-08-23 LAB — COMPREHENSIVE METABOLIC PANEL
ALT: 14 U/L (ref 0–44)
AST: 20 U/L (ref 15–41)
Albumin: 4.2 g/dL (ref 3.5–5.0)
Alkaline Phosphatase: 62 U/L (ref 38–126)
Anion gap: 11 (ref 5–15)
BUN: 9 mg/dL (ref 8–23)
CO2: 24 mmol/L (ref 22–32)
Calcium: 9.6 mg/dL (ref 8.9–10.3)
Chloride: 105 mmol/L (ref 98–111)
Creatinine, Ser: 0.66 mg/dL (ref 0.44–1.00)
GFR, Estimated: >60 mL/min (ref >60)
Glucose, Bld: 60 mg/dL — ABNORMAL LOW (ref 70–99)
Potassium: 3.9 mmol/L (ref 3.5–5.1)
Sodium: 140 mmol/L (ref 135–145)
Total Bilirubin: 0.6 mg/dL (ref 0.3–1.2)
Total Protein: 7.5 g/dL (ref 6.5–8.1)

## 2023-08-23 LAB — CK: Total CK: 100 U/L (ref 38–234)

## 2023-08-23 LAB — SARS CORONAVIRUS 2 BY RT PCR: SARS Coronavirus 2 by RT PCR: NEGATIVE

## 2023-08-23 MED ORDER — ACETAMINOPHEN 325 MG PO TABS
650.0000 mg | ORAL_TABLET | Freq: Once | ORAL | Status: AC
  Administered 2023-08-23: 650 mg via ORAL
  Filled 2023-08-23: qty 2

## 2023-08-23 MED ORDER — SODIUM CHLORIDE 0.9 % IV BOLUS
1000.0000 mL | Freq: Once | INTRAVENOUS | Status: AC
  Administered 2023-08-23: 1000 mL via INTRAVENOUS

## 2023-08-23 NOTE — Discharge Instructions (Addendum)
If you develop high fever, vomiting, trouble breathing, or any other new/concerning symptoms then return to the ER or call 911.

## 2023-08-23 NOTE — ED Triage Notes (Signed)
Pt reports bilateral shoulder pain that started "months ago." Pt reporting "I fell 5 years ago, it may be from that."

## 2023-08-23 NOTE — ED Provider Notes (Signed)
[place] EMERGENCY DEPARTMENT AT [facility] Provider Note   CSN: [id number] Arrival date & time: 08/23/23  1255     History  Chief Complaint  Patient presents with   Shoulder Pain    Jacqueline Ball is a 82 y.o. female.  HPI 82 year old female presents with concern for fever.  She states for the past several days she has been having times where she feels hot and then cold.  She has not yet taken her temperature.  She also has joint pain and muscle pain throughout, though it is vague on when exactly that part started.  She denies any obvious source of fever including no headache, sore throat, cough, rash, joint swelling, abdominal pain, urinary symptoms or vomiting/diarrhea.  She has not taken anything for the pain in her extremities.  Home Medications Prior to Admission medications   Medication Sig Start Date End Date Taking? Authorizing Provider  ACCU-CHEK GUIDE test strip 1 each by Other route See admin instructions. USE AS DIRECTED UP TO THREE TIMES DAILY 01/16/21   [provider]  acetaminophen (TYLENOL) 500 MG tablet Take 500 mg by mouth every 6 (six) hours as needed for pain.    [provider]  apixaban (ELIQUIS) 5 MG TABS tablet Take 1 tablet (5 mg total) by mouth 2 (two) times daily. 11/24/14   Rinaldo Cloud, MD  atorvastatin (LIPITOR) 20 MG tablet Take 20 mg by mouth every evening.    [provider]  azelastine (ASTELIN) 0.1 % nasal spray Place 1 spray into both nostrils as needed for rhinitis or allergies.  05/24/18   [provider]  Blood Glucose Monitoring Suppl (ACCU-CHEK GUIDE) w/Device KIT 1 each by Other route See admin instructions. USE AS DIRECTED UP TO THREE TIMES DAILY 01/16/21   [provider]  cyclobenzaprine (FLEXERIL) 5 MG tablet Take 1-2 tablets (5-10 mg total) by mouth at bedtime. 06/06/20   Wieters, Hallie C, PA-C  diclofenac Sodium (VOLTAREN) 1 % GEL Apply one application (2g) up to four times daily as  needed for pain. 05/19/23   Nira Conn, MD  donepezil (ARICEPT) 10 MG tablet Take 1 tablet (10 mg total) by mouth at bedtime. 06/06/17   Marvel Plan, MD  esomeprazole (NEXIUM) 40 MG capsule Take 40 mg by mouth daily at 12 noon.    [provider]  fluconazole (DIFLUCAN) 150 MG tablet Take 150 mg by mouth once.    [provider]  fluticasone (FLONASE) 50 MCG/ACT nasal spray Place 1 spray into both nostrils daily. Patient taking differently: Place 1 spray into both nostrils daily as needed for allergies. 09/15/18   Dahlia Byes A, NP  gabapentin (NEURONTIN) 100 MG capsule Take 100 mg by mouth at bedtime.    [provider]  insulin glargine (LANTUS) 100 UNIT/ML injection Inject 0.12 mLs (12 Units total) into the skin every evening. 05/29/23   Osvaldo Shipper, MD  losartan (COZAAR) 25 MG tablet Take 25 mg by mouth daily. 05/03/20   [provider]  metFORMIN (GLUCOPHAGE) 500 MG tablet Take 1 tablet (500 mg total) by mouth 2 (two) times daily with a meal. 06/29/22   Charlynne Pander, MD  mupirocin ointment (BACTROBAN) 2 % Apply 1 Application topically 2 (two) times daily. To affected area till better 08/13/23   Zenia Resides, MD  nystatin cream (MYCOSTATIN) Apply to affected area 2 times daily 04/02/20   Dahlia Byes A, NP  ondansetron (ZOFRAN) 4 MG tablet Take 1 tablet (4  mg total) by mouth every 6 (six) hours. 06/26/23   Monna Fam, MD  traMADol (ULTRAM) 50 MG tablet Take 1 tablet (50 mg total) by mouth 2 (two) times daily as needed for severe pain. for pain 11/23/21   Antony Madura, PA-C      Allergies    Metoprolol    Review of Systems   Review of Systems  Constitutional:  Positive for fever (subjective). Negative for chills.  Respiratory:  Negative for cough and shortness of breath.   Cardiovascular:  Negative for chest pain.  Gastrointestinal:  Negative for abdominal pain.  Musculoskeletal:  Positive for arthralgias and myalgias. Negative for  joint swelling.  Skin:  Negative for rash.  Neurological:  Negative for headaches.    Physical Exam Updated Vital Signs BP (!) 111/51 (BP Location: Left Arm)   Pulse 64   Temp 97.8 F (36.6 C)   Resp 16   SpO2 99%  Physical Exam Vitals and nursing note reviewed.  Constitutional:      Appearance: She is well-developed.  HENT:     Head: Normocephalic and atraumatic.  Cardiovascular:     Rate and Rhythm: Normal rate and regular rhythm.     Heart sounds: Normal heart sounds. No murmur heard. Pulmonary:     Effort: Pulmonary effort is normal.     Breath sounds: Normal breath sounds.  Abdominal:     Palpations: Abdomen is soft.     Tenderness: There is no abdominal tenderness.  Musculoskeletal:     Cervical back: No rigidity.     Comments: No significant tenderness in any of her extremities.  There is no decreased range of motion to the large joints such as shoulder, elbow, knee, hip.  No obvious joint effusions or cellulitis.  Skin:    General: Skin is warm and dry.  Neurological:     Mental Status: She is alert.     ED Results / Procedures / Treatments   Labs (all labs ordered are listed, but only abnormal results are displayed) Labs Reviewed  URINALYSIS, W/ REFLEX TO CULTURE (INFECTION SUSPECTED) - Abnormal; Notable for the following components:      Result Value   APPearance HAZY (*)    Leukocytes,Ua SMALL (*)    Bacteria, UA RARE (*)    All other components within normal limits  COMPREHENSIVE METABOLIC PANEL - Abnormal; Notable for the following components:   Glucose, Bld 60 (*)    All other components within normal limits  CBC WITH DIFFERENTIAL/PLATELET - Abnormal; Notable for the following components:   RBC 3.80 (*)    All other components within normal limits  SARS CORONAVIRUS 2 BY RT PCR  CULTURE, BLOOD (ROUTINE X 2)  CULTURE, BLOOD (ROUTINE X 2)  CK    EKG None  Radiology DG Chest 2 View  Result Date: 08/23/2023 CLINICAL DATA:  Fever, weakness  EXAM: CHEST - 2 VIEW COMPARISON:  06/16/2023 FINDINGS: Cardiac and mediastinal contours are within normal limits. No focal pulmonary opacity. No pleural effusion or pneumothorax. No acute osseous abnormality. IMPRESSION: No acute cardiopulmonary process. Electronically Signed   By: Wiliam Ke M.D.   On: 08/23/2023 19:10    Procedures Procedures    Medications Ordered in ED Medications  sodium chloride 0.9 % bolus 1,000 mL (0 mLs Intravenous Stopped 08/23/23 1938)  acetaminophen (TYLENOL) tablet 650 mg (650 mg Oral Given 08/23/23 1743)    ED Course/ Medical Decision Making/ A&P  Medical Decision Making Amount and/or Complexity of Data Reviewed Labs: ordered.    Details: Normal WBC and CK Radiology: ordered and independent interpretation performed.    Details: No pneumonia  Risk OTC drugs.   Patient presented with similar symptoms a few days ago.  No fever here.  Vital signs reassuring.  Seems to have feel like she is hot and cold though there is no report of an actual fever.  I have broaden the workup but still it is unremarkable.  She has no UTI symptoms and her urine is contaminated without obvious evidence of UTI.  I do not think this is needing antibiotics.  She otherwise was given Tylenol and feels better.  Will discharge home with return precautions.        Final Clinical Impression(s) / ED Diagnoses Final diagnoses:  Myalgia    Rx / DC Orders ED Discharge Orders     None         Pricilla Loveless, MD 08/23/23 [phone]

## 2023-08-24 LAB — BLOOD CULTURE ID PANEL (REFLEXED) - BCID2

## 2023-08-26 LAB — CULTURE, BLOOD (ROUTINE X 2): Special Requests: ADEQUATE

## 2023-08-27 ENCOUNTER — Telehealth (HOSPITAL_BASED_OUTPATIENT_CLINIC_OR_DEPARTMENT_OTHER): Payer: Self-pay | Admitting: *Deleted

## 2023-08-27 NOTE — Progress Notes (Signed)
ED Antimicrobial Stewardship Positive Culture Follow Up   Jacqueline Ball is an 82 y.o. female who presented to [facility] on 08/23/2023 with a chief complaint of  Chief Complaint  Patient presents with   Shoulder Pain    Recent Results (from the past 720 hour(s))  Culture, blood (routine x 2)     Status: None (Preliminary result)   Collection Time: 08/23/23  5:30 PM   Specimen: BLOOD RIGHT FOREARM  Result Value Ref Range Status   Specimen Description   Final    BLOOD RIGHT FOREARM Performed at [facility], [address], [place], [place] [address]    Special Requests   Final    BOTTLES DRAWN AEROBIC ONLY Blood Culture results may not be optimal due to an inadequate volume of blood received in culture bottles Performed at [facility], [address] [place], [place] [address]    Culture   Final    NO GROWTH 4 DAYS Performed at [facility], [address] [place], [place] [address]    Report Status PENDING  Incomplete  Culture, blood (routine x 2)     Status: Abnormal   Collection Time: 08/23/23  5:30 PM   Specimen: BLOOD  Result Value Ref Range Status   Specimen Description   Final    BLOOD RIGHT ANTECUBITAL Performed at [facility], [address] [place], [place] [address]    Special Requests   Final    BOTTLES DRAWN AEROBIC AND ANAEROBIC Blood Culture adequate volume Performed at [facility], [address] [place], [place] [address]    Culture  Setup Time   Final    GRAM POSITIVE COCCI ANAEROBIC BOTTLE ONLY Organism ID to follow CRITICAL RESULT CALLED TO, READ BACK BY AND VERIFIED WITH: RN Therese Sarah [id number] AT 1335 BY EC    Culture (A)  Final    STAPHYLOCOCCUS HOMINIS THE SIGNIFICANCE OF ISOLATING THIS ORGANISM FROM A SINGLE SET OF BLOOD CULTURES WHEN MULTIPLE SETS ARE DRAWN IS UNCERTAIN. PLEASE NOTIFY THE MICROBIOLOGY DEPARTMENT WITHIN ONE WEEK IF SPECIATION AND SENSITIVITIES ARE  REQUIRED. Performed at [facility], [address], [place], [place] [address]    Report Status 08/26/2023 FINAL  Final  Blood Culture ID Panel (Reflexed)     Status: Abnormal   Collection Time: 08/23/23  5:30 PM  Result Value Ref Range Status   Enterococcus faecalis NOT DETECTED NOT DETECTED Final   Enterococcus Faecium NOT DETECTED NOT DETECTED Final   Listeria monocytogenes NOT DETECTED NOT DETECTED Final   Staphylococcus species DETECTED (A) NOT DETECTED Final    Comment: CRITICAL RESULT CALLED TO, READ BACK BY AND VERIFIED WITH: RN Therese Sarah [phone] AT 1335 BY EC    Staphylococcus aureus (BCID) NOT DETECTED NOT DETECTED Final   Staphylococcus epidermidis NOT DETECTED NOT DETECTED Final   Staphylococcus lugdunensis NOT DETECTED NOT DETECTED Final   Streptococcus species NOT DETECTED NOT DETECTED Final   Streptococcus agalactiae NOT DETECTED NOT DETECTED Final   Streptococcus pneumoniae NOT DETECTED NOT DETECTED Final   Streptococcus pyogenes NOT DETECTED NOT DETECTED Final   A.calcoaceticus-baumannii NOT DETECTED NOT DETECTED Final   Bacteroides fragilis NOT DETECTED NOT DETECTED Final   Enterobacterales NOT DETECTED NOT DETECTED Final   Enterobacter cloacae complex NOT DETECTED NOT DETECTED Final   Escherichia coli NOT DETECTED NOT DETECTED Final   Klebsiella aerogenes NOT DETECTED NOT DETECTED Final   Klebsiella oxytoca NOT DETECTED NOT DETECTED Final  Klebsiella pneumoniae NOT DETECTED NOT DETECTED Final   Proteus species NOT DETECTED NOT DETECTED Final   Salmonella species NOT DETECTED NOT DETECTED Final   Serratia marcescens NOT DETECTED NOT DETECTED Final   Haemophilus influenzae NOT DETECTED NOT DETECTED Final   Neisseria meningitidis NOT DETECTED NOT DETECTED Final   Pseudomonas aeruginosa NOT DETECTED NOT DETECTED Final   Stenotrophomonas maltophilia NOT DETECTED NOT DETECTED Final   Candida albicans NOT DETECTED NOT DETECTED Final   Candida auris NOT DETECTED  NOT DETECTED Final   Candida glabrata NOT DETECTED NOT DETECTED Final   Candida krusei NOT DETECTED NOT DETECTED Final   Candida parapsilosis NOT DETECTED NOT DETECTED Final   Candida tropicalis NOT DETECTED NOT DETECTED Final   Cryptococcus neoformans/gattii NOT DETECTED NOT DETECTED Final    Comment: Performed at [facility], [address], [place], [place] [address]  SARS Coronavirus 2 by RT PCR (hospital order, performed in [facility] hospital lab) *cepheid single result test* Anterior Nasal Swab     Status: None   Collection Time: 08/23/23  5:41 PM   Specimen: Anterior Nasal Swab  Result Value Ref Range Status   SARS Coronavirus 2 by RT PCR NEGATIVE NEGATIVE Final    Comment: (NOTE) SARS-CoV-2 target nucleic acids are NOT DETECTED.  The SARS-CoV-2 RNA is generally detectable in upper and lower respiratory specimens during the acute phase of infection. The lowest concentration of SARS-CoV-2 viral copies this assay can detect is 250 copies / mL. A negative result does not preclude SARS-CoV-2 infection and should not be used as the sole basis for treatment or other patient management decisions.  A negative result may occur with improper specimen collection / handling, submission of specimen other than nasopharyngeal swab, presence of viral mutation(s) within the areas targeted by this assay, and inadequate number of viral copies (<250 copies / mL). A negative result must be combined with clinical observations, patient history, and epidemiological information.  Fact Sheet for Patients:   RoadLapTop.co.za  Fact Sheet for Healthcare Providers: http://kim-miller.com/  This test is not yet approved or  cleared by the [place] FDA and has been authorized for detection and/or diagnosis of SARS-CoV-2 by FDA under an Emergency Use Authorization (EUA).  This EUA will remain in effect (meaning this test can be used) for the duration  of the COVID-19 declaration under Section 564(b)(1) of the Act, 21 U.S.C. section 360bbb-3(b)(1), unless the authorization is terminated or revoked sooner.  Performed at [facility], [address], [place], [place] [address]     Patient discharged originally without antimicrobial agent. Blood cultures positive for Staphylococcus hominis found in only 1 of 3 bottles. This is considered to be a contaminant. No antibiotic treatment is indicated.    ED Provider: Beckey Downing, MD   Buddy Duty PharmD Candidate [id number]  08/27/2023 9:50 AM

## 2023-08-27 NOTE — Telephone Encounter (Signed)
Post ED Visit - Positive Culture Follow-up  Culture report reviewed by antimicrobial stewardship pharmacist: [facility] []  Enzo Bi, Pharm.D. []  Celedonio Miyamoto, Pharm.D., BCPS AQ-ID []  Garvin Fila, Pharm.D., BCPS []  Georgina Pillion, Pharm.D., BCPS []  [place], [address].D., BCPS, AAHIVP []  Estella Husk, Pharm.D., BCPS, AAHIVP []  Lysle Pearl, PharmD, BCPS []  Phillips Climes, PharmD, BCPS []  Agapito Games, PharmD, BCPS []  Verlan Friends, PharmD []  Mervyn Gay, PharmD, BCPS []  Vinnie Level, PharmD  [facility] []  Barclay  MartinPharmD []  Greer Pickerel, PharmD []  Adalberto Cole, PharmD []  Perlie Gold, Rph []  Lonell Face) Jean Rosenthal, PharmD []  Earl Many, PharmD []  Junita Push, PharmD []  Dorna Leitz, PharmD []  Terrilee Files, PharmD []  Lynann Beaver, PharmD []  Keturah Barre, PharmD []  Loralee Pacas, PharmD []  Bernadene Person, PharmD   Positive blood culture Treated with no antibiotic, Considered to be contamination.  Nena Polio Garner Nash 08/27/2023, 10:50 AM

## 2023-08-28 LAB — CULTURE, BLOOD (ROUTINE X 2): Culture: NO GROWTH

## 2023-09-06 ENCOUNTER — Emergency Department (HOSPITAL_COMMUNITY)
Admission: EM | Admit: 2023-09-06 | Discharge: 2023-09-07 | Disposition: A | Discharge status: Home / Self Care | Payer: Medicare HMO | Attending: Emergency Medicine | Admitting: Emergency Medicine

## 2023-09-06 ENCOUNTER — Encounter (HOSPITAL_COMMUNITY): Payer: Self-pay | Admitting: *Deleted

## 2023-09-06 ENCOUNTER — Other Ambulatory Visit: Payer: Self-pay

## 2023-09-06 DIAGNOSIS — I10 Essential (primary) hypertension: Secondary | ICD-10-CM | POA: Insufficient documentation

## 2023-09-06 DIAGNOSIS — Z7984 Long term (current) use of oral hypoglycemic drugs: Secondary | ICD-10-CM | POA: Insufficient documentation

## 2023-09-06 DIAGNOSIS — Z794 Long term (current) use of insulin: Secondary | ICD-10-CM | POA: Insufficient documentation

## 2023-09-06 DIAGNOSIS — R42 Dizziness and giddiness: Secondary | ICD-10-CM | POA: Diagnosis not present

## 2023-09-06 DIAGNOSIS — Z79899 Other long term (current) drug therapy: Secondary | ICD-10-CM | POA: Diagnosis not present

## 2023-09-06 DIAGNOSIS — E119 Type 2 diabetes mellitus without complications: Secondary | ICD-10-CM | POA: Insufficient documentation

## 2023-09-06 DIAGNOSIS — Z7901 Long term (current) use of anticoagulants: Secondary | ICD-10-CM | POA: Diagnosis not present

## 2023-09-06 DIAGNOSIS — N179 Acute kidney failure, unspecified: Secondary | ICD-10-CM | POA: Insufficient documentation

## 2023-09-06 DIAGNOSIS — R5381 Other malaise: Secondary | ICD-10-CM | POA: Diagnosis not present

## 2023-09-06 DIAGNOSIS — I4891 Unspecified atrial fibrillation: Secondary | ICD-10-CM | POA: Insufficient documentation

## 2023-09-06 LAB — COMPREHENSIVE METABOLIC PANEL
ALT: 15 U/L (ref 0–44)
AST: 22 U/L (ref 15–41)
Albumin: 4 g/dL (ref 3.5–5.0)
Alkaline Phosphatase: 61 U/L (ref 38–126)
Anion gap: 15 (ref 5–15)
BUN: 22 mg/dL (ref 8–23)
CO2: 19 mmol/L — ABNORMAL LOW (ref 22–32)
Calcium: 9.5 mg/dL (ref 8.9–10.3)
Chloride: 102 mmol/L (ref 98–111)
Creatinine, Ser: 1.24 mg/dL — ABNORMAL HIGH (ref 0.44–1.00)
GFR, Estimated: 44 mL/min — ABNORMAL LOW (ref >60)
Glucose, Bld: 127 mg/dL — ABNORMAL HIGH (ref 70–99)
Potassium: 3.4 mmol/L — ABNORMAL LOW (ref 3.5–5.1)
Sodium: 136 mmol/L (ref 135–145)
Total Bilirubin: 0.6 mg/dL (ref 0.3–1.2)
Total Protein: 7.2 g/dL (ref 6.5–8.1)

## 2023-09-06 LAB — CBC
HCT: 37.8 % (ref 36.0–46.0)
Hemoglobin: 12.4 g/dL (ref 12.0–15.0)
MCH: 32.2 pg (ref 26.0–34.0)
MCHC: 32.8 g/dL (ref 30.0–36.0)
MCV: 98.2 fL (ref 80.0–100.0)
Platelets: 405 10*3/uL — ABNORMAL HIGH (ref 150–400)
RBC: 3.85 MIL/uL — ABNORMAL LOW (ref 3.87–5.11)
RDW: 12.4 % (ref 11.5–15.5)
WBC: 8.3 10*3/uL (ref 4.0–10.5)
nRBC: 0 % (ref 0.0–0.2)

## 2023-09-06 LAB — LIPASE, BLOOD: Lipase: 36 U/L (ref 11–51)

## 2023-09-06 NOTE — ED Triage Notes (Signed)
The pt reports that's she is going to pass out any time for one month

## 2023-09-07 LAB — URINALYSIS, ROUTINE W REFLEX MICROSCOPIC
Bilirubin Urine: NEGATIVE
Glucose, UA: NEGATIVE mg/dL
Hgb urine dipstick: NEGATIVE
Ketones, ur: 5 mg/dL — AB
Nitrite: NEGATIVE
Protein, ur: NEGATIVE mg/dL
Specific Gravity, Urine: 1.02 (ref 1.005–1.030)
pH: 5 (ref 5.0–8.0)

## 2023-09-07 MED ORDER — SODIUM CHLORIDE 0.9 % IV BOLUS
1000.0000 mL | Freq: Once | INTRAVENOUS | Status: AC
  Administered 2023-09-07: 1000 mL via INTRAVENOUS

## 2023-09-07 NOTE — ED Notes (Signed)
Pt provided with AVS.  Education complete; all questions answered.  Pt leaving ED in stable condition at this time, ambulatory with all belongings. 

## 2023-09-07 NOTE — Discharge Instructions (Signed)
Your kidney function was mildly elevated and you are given a bolus of IV fluids while in the emergency department.  We recommend having your kidney function rechecked by your primary care doctor in approximately 1 week.  Be sure to eat regular meals and drink plenty of fluids.  The remainder of your evaluation while in the ED today has been reassuring.  Return for any new or concerning symptoms.

## 2023-09-07 NOTE — ED Provider Notes (Signed)
[place] EMERGENCY DEPARTMENT AT [facility] Provider Note   CSN: [id number] Arrival date & time: 09/06/23  1654     History  Chief Complaint  Patient presents with   feels like she is going to pass out    Jacqueline Ball is a 82 y.o. female.  82 year old female with a history of hypertension, diabetes, hyperlipidemia, atrial fibrillation (on Eliquis) presents to the emergency department for evaluation.  In triage she reports sensations as though she was going to pass out.  Referenced intermittent symptoms for the past month.  During my assessment, she endorses complaints of more fatigue and malaise.  Feels that she has been experiencing low energy.  She states that she has felt this way for the past 4 to 5 months.  She has not seen her primary care doctor for further evaluation of this.  She has no chest pain, shortness of breath, nausea, vomiting, urinary symptoms.  Her daughter reports that she has not been eating well.  No recent fevers.  This is the patient's 8th visit to the ED in the past 2 months for similar, vague complaints.  The history is provided by the patient. No language interpreter was used.       Home Medications Prior to Admission medications   Medication Sig Start Date End Date Taking? Authorizing Provider  ACCU-CHEK GUIDE test strip 1 each by Other route See admin instructions. USE AS DIRECTED UP TO THREE TIMES DAILY 01/16/21   [provider]  acetaminophen (TYLENOL) 500 MG tablet Take 500 mg by mouth every 6 (six) hours as needed for pain.    [provider]  apixaban (ELIQUIS) 5 MG TABS tablet Take 1 tablet (5 mg total) by mouth 2 (two) times daily. 11/24/14   Rinaldo Cloud, MD  atorvastatin (LIPITOR) 20 MG tablet Take 20 mg by mouth every evening.    [provider]  azelastine (ASTELIN) 0.1 % nasal spray Place 1 spray into both nostrils as needed for rhinitis or allergies.  05/24/18   [provider]  Blood Glucose  Monitoring Suppl (ACCU-CHEK GUIDE) w/Device KIT 1 each by Other route See admin instructions. USE AS DIRECTED UP TO THREE TIMES DAILY 01/16/21   [provider]  cyclobenzaprine (FLEXERIL) 5 MG tablet Take 1-2 tablets (5-10 mg total) by mouth at bedtime. 06/06/20   Wieters, Hallie C, PA-C  diclofenac Sodium (VOLTAREN) 1 % GEL Apply one application (2g) up to four times daily as needed for pain. 05/19/23   Nira Conn, MD  donepezil (ARICEPT) 10 MG tablet Take 1 tablet (10 mg total) by mouth at bedtime. 06/06/17   Marvel Plan, MD  esomeprazole (NEXIUM) 40 MG capsule Take 40 mg by mouth daily at 12 noon.    [provider]  fluconazole (DIFLUCAN) 150 MG tablet Take 150 mg by mouth once.    [provider]  fluticasone (FLONASE) 50 MCG/ACT nasal spray Place 1 spray into both nostrils daily. Patient taking differently: Place 1 spray into both nostrils daily as needed for allergies. 09/15/18   Dahlia Byes A, NP  gabapentin (NEURONTIN) 100 MG capsule Take 100 mg by mouth at bedtime.    [provider]  insulin glargine (LANTUS) 100 UNIT/ML injection Inject 0.12 mLs (12 Units total) into the skin every evening. 05/29/23   Osvaldo Shipper, MD  losartan (COZAAR) 25 MG tablet Take 25 mg by mouth daily. 05/03/20   [provider]  metFORMIN (GLUCOPHAGE) 500 MG tablet Take 1 tablet (  500 mg total) by mouth 2 (two) times daily with a meal. 06/29/22   Charlynne Pander, MD  mupirocin ointment (BACTROBAN) 2 % Apply 1 Application topically 2 (two) times daily. To affected area till better 08/13/23   Zenia Resides, MD  nystatin cream (MYCOSTATIN) Apply to affected area 2 times daily 04/02/20   Dahlia Byes A, NP  ondansetron (ZOFRAN) 4 MG tablet Take 1 tablet (4 mg total) by mouth every 6 (six) hours. 06/26/23   Monna Fam, MD  traMADol (ULTRAM) 50 MG tablet Take 1 tablet (50 mg total) by mouth 2 (two) times daily as needed for severe pain. for pain 11/23/21   Antony Madura, PA-C      Allergies    Metoprolol    Review of Systems   Review of Systems Ten systems reviewed and are negative for acute change, except as noted in the HPI.    Physical Exam Updated Vital Signs BP 127/89   Pulse 63   Temp 97.9 F (36.6 C)   Resp 16   Ht 5\' 4"  (1.626 m)   Wt 61.2 kg   SpO2 100%   BMI 23.16 kg/m   Physical Exam Vitals and nursing note reviewed.  Constitutional:      General: She is not in acute distress.    Appearance: She is well-developed. She is not diaphoretic.     Comments: Nontoxic appearing and in NAD. Pleasant AA female.  HENT:     Head: Normocephalic and atraumatic.  Eyes:     General: No scleral icterus.    Extraocular Movements: EOM normal.     Conjunctiva/sclera: Conjunctivae normal.  Cardiovascular:     Rate and Rhythm: Normal rate and regular rhythm.     Pulses: Normal pulses.  Pulmonary:     Effort: Pulmonary effort is normal. No respiratory distress.     Breath sounds: No stridor. No wheezing.     Comments: Respirations even and unlabored Musculoskeletal:        General: Normal range of motion.     Cervical back: Normal range of motion.  Skin:    General: Skin is warm and dry.     Coloration: Skin is not pale.     Findings: No erythema or rash.  Neurological:     Mental Status: She is alert and oriented to person, place, and time.     Coordination: Coordination normal.  Psychiatric:        Mood and Affect: Mood and affect normal.        Behavior: Behavior normal.     ED Results / Procedures / Treatments   Labs (all labs ordered are listed, but only abnormal results are displayed) Labs Reviewed  COMPREHENSIVE METABOLIC PANEL - Abnormal; Notable for the following components:      Result Value   Potassium 3.4 (*)    CO2 19 (*)    Glucose, Bld 127 (*)    Creatinine, Ser 1.24 (*)    GFR, Estimated 44 (*)    All other components within normal limits  CBC - Abnormal; Notable for the following components:   RBC  3.85 (*)    Platelets 405 (*)    All other components within normal limits  URINALYSIS, ROUTINE W REFLEX MICROSCOPIC - Abnormal; Notable for the following components:   APPearance HAZY (*)    Ketones, ur 5 (*)    Leukocytes,Ua SMALL (*)    Bacteria, UA RARE (*)    All other components within normal  limits  LIPASE, BLOOD    EKG EKG Interpretation Date/Time:  Friday September 06 2023 17:47:38 EDT Ventricular Rate:  90 PR Interval:  142 QRS Duration:  66 QT Interval:  358 QTC Calculation: 437 R Axis:   34  Text Interpretation: Sinus rhythm with occasional Premature ventricular complexes Cannot rule out Anterior infarct , age undetermined Abnormal ECG When compared with ECG of 09-Aug-2023 16:35, PREVIOUS ECG IS PRESENT Confirmed by Marily Memos [phone]) on 09/06/2023 11:22:41 PM  Radiology No results found.  Procedures Procedures    Medications Ordered in ED Medications  sodium chloride 0.9 % bolus 1,000 mL (0 mLs Intravenous Stopped 09/07/23 0207)    ED Course/ Medical Decision Making/ A&P Clinical Course as of 09/07/23 0332  Sat Sep 07, 2023  0154 VSS with orthostatic testing. [KH]  0331 Patient sleeping comfortably.  Upon waking she has no additional complaints.  Denies lightheadedness or dizziness with position change when undergoing orthostatic testing. [KH]    Clinical Course User Index [KH] Antony Madura, PA-C                                 Medical Decision Making  This patient presents to the ED for concern of fatigue/malaise, this involves an extensive number of treatment options, and is a complaint that carries with it a high risk of complications and morbidity.  The differential diagnosis includes dehydration vs viral illness vs hypoglycemia vs UTI vs arrhythmia    Co morbidities that complicate the patient evaluation  HTN DM Afib   Additional history obtained:  Additional history obtained from daughter, at bedside External records from outside  source obtained and reviewed including negative head CT on 07/23/23   Lab Tests:  I Ordered, and personally interpreted labs.  The pertinent results include:  creatinine 1.24 (baseline ~0.7). Negative UA. Normal CBC.   Cardiac Monitoring:  The patient was maintained on a cardiac monitor.  I personally viewed and interpreted the cardiac monitored which showed an underlying rhythm of: NSR   Medicines ordered and prescription drug management:  I have reviewed the patients home medicines and have made adjustments as needed   Test Considered:  CT head - however, recent reassuring imaging and no focal deficits on exam.   Problem List / ED Course:  Patient presenting for vague complaints of fatigue and malaise.  This is her eighth visit to the emergency department in the past 2 months for similar reasons. She denies any recent fever, symptoms of viral illness.  Continues to have a normal white blood cell count.  Low suspicion for infectious etiology.  No significant electrolyte derangements. The patient does have a slight bump in her creatinine from baseline.  This occurred once a few months ago and was transient, resolving by the patient's next ED visit.  She was hydrated with 1 L of IV fluids.  Stable to have her creatinine rechecked by a primary care doctor.  Orthostatic vital signs testing was negative.  No tachycardia or hypotension with position change. Urinalysis negative for urinary tract infection. EKG without concerning arrhythmia.  No complaints of chest pain or shortness of breath.   Reevaluation:  After the interventions noted above, I reevaluated the patient and found that they have :stayed the same   Social Determinants of Health:  Good social support; family present.   Dispostion:  After consideration of the diagnostic results and the patients response to treatment, I feel that  the patent would benefit from follow-up with her primary care doctor.  No concern for  emergent process based on workup today.  Patient encouraged to continue her prescribed medications.  Stressed to the importance of a balanced diet and adequate fluid intake.  Return precautions discussed and provided. Patient discharged in stable condition with no unaddressed concerns.         Final Clinical Impression(s) / ED Diagnoses Final diagnoses:  AKI (acute kidney injury) [facility])    Rx / DC Orders ED Discharge Orders     None         Antony Madura, PA-C 09/07/23 0346    Sabas Sous, MD 09/07/23 [phone]

## 2023-09-13 ENCOUNTER — Other Ambulatory Visit: Payer: Self-pay

## 2023-09-13 ENCOUNTER — Emergency Department (HOSPITAL_COMMUNITY)
Admission: EM | Admit: 2023-09-13 | Discharge: 2023-09-13 | Disposition: A | Discharge status: Home / Self Care | Payer: Medicare HMO | Attending: Emergency Medicine | Admitting: Emergency Medicine

## 2023-09-13 ENCOUNTER — Encounter (HOSPITAL_COMMUNITY): Payer: Self-pay | Admitting: *Deleted

## 2023-09-13 DIAGNOSIS — M79642 Pain in left hand: Secondary | ICD-10-CM | POA: Diagnosis not present

## 2023-09-13 DIAGNOSIS — Z794 Long term (current) use of insulin: Secondary | ICD-10-CM | POA: Insufficient documentation

## 2023-09-13 DIAGNOSIS — L299 Pruritus, unspecified: Secondary | ICD-10-CM | POA: Insufficient documentation

## 2023-09-13 DIAGNOSIS — E876 Hypokalemia: Secondary | ICD-10-CM | POA: Diagnosis not present

## 2023-09-13 DIAGNOSIS — Z7901 Long term (current) use of anticoagulants: Secondary | ICD-10-CM | POA: Insufficient documentation

## 2023-09-13 DIAGNOSIS — M79641 Pain in right hand: Secondary | ICD-10-CM | POA: Diagnosis not present

## 2023-09-13 LAB — CBC
HCT: 34.7 % — ABNORMAL LOW (ref 36.0–46.0)
Hemoglobin: 11.6 g/dL — ABNORMAL LOW (ref 12.0–15.0)
MCH: 32.7 pg (ref 26.0–34.0)
MCHC: 33.4 g/dL (ref 30.0–36.0)
MCV: 97.7 fL (ref 80.0–100.0)
Platelets: 376 10*3/uL (ref 150–400)
RBC: 3.55 MIL/uL — ABNORMAL LOW (ref 3.87–5.11)
RDW: 12.1 % (ref 11.5–15.5)
WBC: 7.9 10*3/uL (ref 4.0–10.5)
nRBC: 0 % (ref 0.0–0.2)

## 2023-09-13 LAB — COMPREHENSIVE METABOLIC PANEL
ALT: 15 U/L (ref 0–44)
AST: 19 U/L (ref 15–41)
Albumin: 3.6 g/dL (ref 3.5–5.0)
Alkaline Phosphatase: 66 U/L (ref 38–126)
Anion gap: 10 (ref 5–15)
BUN: 7 mg/dL — ABNORMAL LOW (ref 8–23)
CO2: 24 mmol/L (ref 22–32)
Calcium: 9.2 mg/dL (ref 8.9–10.3)
Chloride: 105 mmol/L (ref 98–111)
Creatinine, Ser: 1.03 mg/dL — ABNORMAL HIGH (ref 0.44–1.00)
GFR, Estimated: 55 mL/min — ABNORMAL LOW (ref >60)
Glucose, Bld: 129 mg/dL — ABNORMAL HIGH (ref 70–99)
Potassium: 2.9 mmol/L — ABNORMAL LOW (ref 3.5–5.1)
Sodium: 139 mmol/L (ref 135–145)
Total Bilirubin: 0.6 mg/dL (ref 0.3–1.2)
Total Protein: 6.8 g/dL (ref 6.5–8.1)

## 2023-09-13 MED ORDER — DICLOFENAC SODIUM 1 % EX GEL: 2.0000 g | Freq: Four times a day (QID) | CUTANEOUS | 0 refills | Status: DC

## 2023-09-13 MED ORDER — CLOTRIMAZOLE 1 % EX CREA: TOPICAL_CREAM | CUTANEOUS | 0 refills | Status: DC

## 2023-09-13 MED ORDER — POTASSIUM CHLORIDE CRYS ER 20 MEQ PO TBCR
40.0000 meq | EXTENDED_RELEASE_TABLET | Freq: Once | ORAL | Status: AC
  Administered 2023-09-13: 40 meq via ORAL
  Filled 2023-09-13: qty 2

## 2023-09-13 NOTE — ED Triage Notes (Signed)
The pt was here last week with an inablity to eat  for several days

## 2023-09-13 NOTE — Discharge Instructions (Addendum)
Apply Voltaren gel to your hands. Apply Lotrimin to your feet. Follow-up with podiatry if your feet continue to bother you.

## 2023-09-13 NOTE — ED Provider Notes (Signed)
[place] EMERGENCY DEPARTMENT AT [facility] Provider Note   CSN: [id number] Arrival date & time: 09/13/23  1749     History  Chief Complaint  Patient presents with   cant eat    Jacqueline Ball is a 82 y.o. female.  82 year old female brought in by daughter and husband for concern for bilateral thumb pain and foot itching. Reports as an ongoing problem, not improving with foot cream. States feet are very itchy, claws at her feet at times. Last seen by PCP in clinic 2 weeks ago, did not inquire about hands and feet at that visit. Husband asks if she needs arch supports. No other questions or concerns tonight.        Home Medications Prior to Admission medications   Medication Sig Start Date End Date Taking? Authorizing Provider  clotrimazole (LOTRIMIN) 1 % cream Apply to affected area 2 times daily 09/13/23  Yes Army Melia A, PA-C  diclofenac Sodium (VOLTAREN) 1 % GEL Apply 2 g topically 4 (four) times daily. 09/13/23  Yes Jeannie Fend, PA-C  ACCU-CHEK GUIDE test strip 1 each by Other route See admin instructions. USE AS DIRECTED UP TO THREE TIMES DAILY 01/16/21   [provider]  acetaminophen (TYLENOL) 500 MG tablet Take 500 mg by mouth every 6 (six) hours as needed for pain.    [provider]  apixaban (ELIQUIS) 5 MG TABS tablet Take 1 tablet (5 mg total) by mouth 2 (two) times daily. 11/24/14   Rinaldo Cloud, MD  atorvastatin (LIPITOR) 20 MG tablet Take 20 mg by mouth every evening.    [provider]  azelastine (ASTELIN) 0.1 % nasal spray Place 1 spray into both nostrils as needed for rhinitis or allergies.  05/24/18   [provider]  Blood Glucose Monitoring Suppl (ACCU-CHEK GUIDE) w/Device KIT 1 each by Other route See admin instructions. USE AS DIRECTED UP TO THREE TIMES DAILY 01/16/21   [provider]  cyclobenzaprine (FLEXERIL) 5 MG tablet Take 1-2 tablets (5-10 mg total) by mouth at bedtime. 06/06/20   Wieters,  Hallie C, PA-C  donepezil (ARICEPT) 10 MG tablet Take 1 tablet (10 mg total) by mouth at bedtime. 06/06/17   Marvel Plan, MD  esomeprazole (NEXIUM) 40 MG capsule Take 40 mg by mouth daily at 12 noon.    [provider]  fluconazole (DIFLUCAN) 150 MG tablet Take 150 mg by mouth once.    [provider]  fluticasone (FLONASE) 50 MCG/ACT nasal spray Place 1 spray into both nostrils daily. Patient taking differently: Place 1 spray into both nostrils daily as needed for allergies. 09/15/18   Dahlia Byes A, NP  gabapentin (NEURONTIN) 100 MG capsule Take 100 mg by mouth at bedtime.    [provider]  insulin glargine (LANTUS) 100 UNIT/ML injection Inject 0.12 mLs (12 Units total) into the skin every evening. 05/29/23   Osvaldo Shipper, MD  losartan (COZAAR) 25 MG tablet Take 25 mg by mouth daily. 05/03/20   [provider]  metFORMIN (GLUCOPHAGE) 500 MG tablet Take 1 tablet (500 mg total) by mouth 2 (two) times daily with a meal. 06/29/22   Charlynne Pander, MD  mupirocin ointment (BACTROBAN) 2 % Apply 1 Application topically 2 (two) times daily. To affected area till better 08/13/23   Zenia Resides, MD  nystatin cream (MYCOSTATIN) Apply to affected area 2 times daily 04/02/20   Dahlia Byes A, NP  ondansetron (ZOFRAN) 4 MG tablet Take 1  tablet (4 mg total) by mouth every 6 (six) hours. 06/26/23   Monna Fam, MD  traMADol (ULTRAM) 50 MG tablet Take 1 tablet (50 mg total) by mouth 2 (two) times daily as needed for severe pain. for pain 11/23/21   Antony Madura, PA-C      Allergies    Metoprolol    Review of Systems   Review of Systems Negative except as per HPI Physical Exam Updated Vital Signs BP 109/81 (BP Location: Left Arm)   Pulse 87   Temp (!) 97.5 F (36.4 C)   Resp 17   Ht 5\' 4"  (1.626 m)   Wt 61.2 kg   SpO2 98%   BMI 23.16 kg/m  Physical Exam Vitals and nursing note reviewed.  Constitutional:      General: She is not in acute distress.     Appearance: She is well-developed. She is not diaphoretic.  HENT:     Head: Normocephalic and atraumatic.  Cardiovascular:     Pulses: Normal pulses.  Pulmonary:     Effort: Pulmonary effort is normal.  Musculoskeletal:        General: No swelling, tenderness or signs of injury. Normal range of motion.     Right lower leg: No edema.     Left lower leg: No edema.     Comments: Arthritic changes to joints of hands without erythema, skin changes, tenderness. Normal ROM of joints, sensation intact, cap refill present.  Plantar surface of feet appear dry, peeling, areas of excoriation without evidence of secondary infection  Skin:    General: Skin is warm and dry.  Neurological:     Mental Status: She is alert and oriented to person, place, and time.  Psychiatric:        Behavior: Behavior normal.     ED Results / Procedures / Treatments   Labs (all labs ordered are listed, but only abnormal results are displayed) Labs Reviewed  COMPREHENSIVE METABOLIC PANEL - Abnormal; Notable for the following components:      Result Value   Potassium 2.9 (*)    Glucose, Bld 129 (*)    BUN 7 (*)    Creatinine, Ser 1.03 (*)    GFR, Estimated 55 (*)    All other components within normal limits  CBC - Abnormal; Notable for the following components:   RBC 3.55 (*)    Hemoglobin 11.6 (*)    HCT 34.7 (*)    All other components within normal limits    EKG None  Radiology No results found.  Procedures Procedures    Medications Ordered in ED Medications  potassium chloride SA (KLOR-CON M) CR tablet 40 mEq (has no administration in time range)    ED Course/ Medical Decision Making/ A&P                                 Medical Decision Making  82 year old female brought in by family with triage complaint of inability to eat for several days. At the time of my exam, patient denies this and states she is here today because she has pain in her thumbs and her feet itch. Hands with  arthritic changes, no erythema or skin changes to suggest septic joint of other emergent condition. Feet are dry and excoriated consistent with history of itching her feet. Recommend Lotrimin cream to feet, voltaren gel to hands. Provided with information for podiatry for follow up as  needed if not improving with the Lotrimin.   Labs reviewed, mild hypokalemia, replaced orally. Cr improved compared to prior on file.         Final Clinical Impression(s) / ED Diagnoses Final diagnoses:  Pruritus  Pain in both hands  Hypokalemia    Rx / DC Orders ED Discharge Orders          Ordered    diclofenac Sodium (VOLTAREN) 1 % GEL  4 times daily        09/13/23 2234    clotrimazole (LOTRIMIN) 1 % cream        09/13/23 2234              Jeannie Fend, PA-C 09/13/23 2245    Sabas Sous, MD 09/14/23 [phone]

## 2023-09-24 ENCOUNTER — Other Ambulatory Visit: Payer: Self-pay

## 2023-09-24 ENCOUNTER — Emergency Department (HOSPITAL_COMMUNITY)
Admission: EM | Admit: 2023-09-24 | Discharge: 2023-09-24 | Discharge status: Against Medical Advice | Payer: Medicare HMO | Attending: Emergency Medicine | Admitting: Emergency Medicine

## 2023-09-24 DIAGNOSIS — Z5321 Procedure and treatment not carried out due to patient leaving prior to being seen by health care provider: Secondary | ICD-10-CM | POA: Diagnosis not present

## 2023-09-24 DIAGNOSIS — R519 Headache, unspecified: Secondary | ICD-10-CM | POA: Insufficient documentation

## 2023-09-24 NOTE — ED Notes (Signed)
Pt left without being seen.

## 2023-09-24 NOTE — ED Triage Notes (Signed)
Patient reports facial pain and hot feeling on her face for 2 months.

## 2023-09-26 ENCOUNTER — Other Ambulatory Visit: Payer: Self-pay

## 2023-09-26 ENCOUNTER — Emergency Department (HOSPITAL_COMMUNITY)
Admission: EM | Admit: 2023-09-26 | Discharge: 2023-09-26 | Disposition: A | Discharge status: Home / Self Care | Payer: Medicare HMO | Attending: Emergency Medicine | Admitting: Emergency Medicine

## 2023-09-26 DIAGNOSIS — Z7901 Long term (current) use of anticoagulants: Secondary | ICD-10-CM | POA: Diagnosis not present

## 2023-09-26 DIAGNOSIS — M79672 Pain in left foot: Secondary | ICD-10-CM | POA: Diagnosis not present

## 2023-09-26 DIAGNOSIS — M79671 Pain in right foot: Secondary | ICD-10-CM | POA: Insufficient documentation

## 2023-09-26 DIAGNOSIS — Z7984 Long term (current) use of oral hypoglycemic drugs: Secondary | ICD-10-CM | POA: Diagnosis not present

## 2023-09-26 DIAGNOSIS — Z79899 Other long term (current) drug therapy: Secondary | ICD-10-CM | POA: Insufficient documentation

## 2023-09-26 DIAGNOSIS — Z794 Long term (current) use of insulin: Secondary | ICD-10-CM | POA: Diagnosis not present

## 2023-09-26 NOTE — Discharge Instructions (Signed)
Return for any problem.  Continue to use Lotrimin Cream as previously instructed.

## 2023-09-26 NOTE — ED Notes (Signed)
Pt provided with socks.

## 2023-09-26 NOTE — ED Triage Notes (Signed)
Pt c/o bilateral foot pain that has been ongoing for several months. Pt ambulatory in triage. No trauma/injury.

## 2023-09-26 NOTE — ED Provider Notes (Signed)
[place] EMERGENCY DEPARTMENT AT [facility] Provider Note   CSN: [id number] Arrival date & time: 09/26/23  1832     History  Chief Complaint  Patient presents with   Foot Pain    Bilateral     Jacqueline Ball is a 82 y.o. female.  82 year old female with prior medical history detailed below presents for evaluation.  Patient appears to be primarily coming for evaluation of persistent foot discomfort.  Patient was seen for same complaint by our facilities within the last 2 weeks.  Patient was advised to use Lotrimin cream to both feet.  She reports that she has been using the Lotrimin with some improvement noted.  On arrival today, patient is wearing rubber boots without socks.  She is ambulatory without difficulty.  She is without other complaint.  Notably, patient arrived here with her daughter who also checked in as a patient.  Patient reports that she has not yet followed up with her PCP and/or podiatry as suggested on prior evaluation.  The history is provided by the patient and medical records.       Home Medications Prior to Admission medications   Medication Sig Start Date End Date Taking? Authorizing Provider  ACCU-CHEK GUIDE test strip 1 each by Other route See admin instructions. USE AS DIRECTED UP TO THREE TIMES DAILY 01/16/21   [provider]  acetaminophen (TYLENOL) 500 MG tablet Take 500 mg by mouth every 6 (six) hours as needed for pain.    [provider]  apixaban (ELIQUIS) 5 MG TABS tablet Take 1 tablet (5 mg total) by mouth 2 (two) times daily. 11/24/14   Rinaldo Cloud, MD  atorvastatin (LIPITOR) 20 MG tablet Take 20 mg by mouth every evening.    [provider]  azelastine (ASTELIN) 0.1 % nasal spray Place 1 spray into both nostrils as needed for rhinitis or allergies.  05/24/18   [provider]  Blood Glucose Monitoring Suppl (ACCU-CHEK GUIDE) w/Device KIT 1 each by Other route See admin instructions. USE AS  DIRECTED UP TO THREE TIMES DAILY 01/16/21   [provider]  clotrimazole (LOTRIMIN) 1 % cream Apply to affected area 2 times daily 09/13/23   Army Melia A, PA-C  cyclobenzaprine (FLEXERIL) 5 MG tablet Take 1-2 tablets (5-10 mg total) by mouth at bedtime. 06/06/20   Wieters, Hallie C, PA-C  diclofenac Sodium (VOLTAREN) 1 % GEL Apply 2 g topically 4 (four) times daily. 09/13/23   Jeannie Fend, PA-C  donepezil (ARICEPT) 10 MG tablet Take 1 tablet (10 mg total) by mouth at bedtime. 06/06/17   Marvel Plan, MD  esomeprazole (NEXIUM) 40 MG capsule Take 40 mg by mouth daily at 12 noon.    [provider]  fluconazole (DIFLUCAN) 150 MG tablet Take 150 mg by mouth once.    [provider]  fluticasone (FLONASE) 50 MCG/ACT nasal spray Place 1 spray into both nostrils daily. Patient taking differently: Place 1 spray into both nostrils daily as needed for allergies. 09/15/18   Dahlia Byes A, NP  gabapentin (NEURONTIN) 100 MG capsule Take 100 mg by mouth at bedtime.    [provider]  insulin glargine (LANTUS) 100 UNIT/ML injection Inject 0.12 mLs (12 Units total) into the skin every evening. 05/29/23   Osvaldo Shipper, MD  losartan (COZAAR) 25 MG tablet Take 25 mg by mouth daily. 05/03/20   [provider]  metFORMIN (GLUCOPHAGE) 500 MG tablet Take 1 tablet (500 mg total) by mouth 2 (  two) times daily with a meal. 06/29/22   Charlynne Pander, MD  mupirocin ointment (BACTROBAN) 2 % Apply 1 Application topically 2 (two) times daily. To affected area till better 08/13/23   Zenia Resides, MD  nystatin cream (MYCOSTATIN) Apply to affected area 2 times daily 04/02/20   Dahlia Byes A, NP  ondansetron (ZOFRAN) 4 MG tablet Take 1 tablet (4 mg total) by mouth every 6 (six) hours. 06/26/23   Monna Fam, MD  traMADol (ULTRAM) 50 MG tablet Take 1 tablet (50 mg total) by mouth 2 (two) times daily as needed for severe pain. for pain 11/23/21   Antony Madura, PA-C      Allergies     Metoprolol    Review of Systems   Review of Systems  All other systems reviewed and are negative.   Physical Exam Updated Vital Signs BP 113/62 (BP Location: Right Arm)   Pulse 87   Temp 98 F (36.7 C) (Oral)   Resp 19   Ht 5\' 4"  (1.626 m)   Wt 60.8 kg   SpO2 98%   BMI 23.00 kg/m  Physical Exam Vitals and nursing note reviewed.  Constitutional:      General: She is not in acute distress.    Appearance: Normal appearance. She is well-developed.  HENT:     Head: Normocephalic and atraumatic.  Eyes:     Conjunctiva/sclera: Conjunctivae normal.     Pupils: Pupils are equal, round, and reactive to light.  Cardiovascular:     Rate and Rhythm: Normal rate and regular rhythm.     Heart sounds: Normal heart sounds.  Pulmonary:     Effort: Pulmonary effort is normal. No respiratory distress.     Breath sounds: Normal breath sounds.  Abdominal:     General: There is no distension.     Palpations: Abdomen is soft.     Tenderness: There is no abdominal tenderness.  Musculoskeletal:        General: No deformity. Normal range of motion.     Cervical back: Normal range of motion and neck supple.  Skin:    General: Skin is warm and dry.  Neurological:     General: No focal deficit present.     Mental Status: She is alert and oriented to person, place, and time.     ED Results / Procedures / Treatments   Labs (all labs ordered are listed, but only abnormal results are displayed) Labs Reviewed - No data to display  EKG None  Radiology No results found.  Procedures Procedures    Medications Ordered in ED Medications - No data to display  ED Course/ Medical Decision Making/ A&P                                 Medical Decision Making   Medical Screen Complete  This patient presented to the ED with complaint of foot discomfort.  This complaint involves an extensive number of treatment options. The initial differential diagnosis includes, but is not limited  to, resolving tinea pedis  This presentation is: Chronic, Self-Limited, Previously Undiagnosed, Uncertain Prognosis, Complicated, Systemic Symptoms, and Threat to Life/Bodily Function  Patient with vague nonspecific complaint of foot discomfort bilaterally.  Patient appears to have had multiple recent evaluations for same complaint.  Patient reports that her symptoms are improving with use of Lotrimin cream.  Exam is most suggestive of likely resolving tinea pedis.  Patient  without indication for further emergent evaluation or treatment.  Patient is encouraged to continue her current course of treatment.  She is advised to closely follow-up with her PCP and/or podiatry.  Strict return precautions given and understood  Importance of close outpatient follow-up stressed.  Additional history obtained: External records from outside sources obtained and reviewed including prior ED visits and prior Inpatient records.   Problem List / ED Course:  Bilateral Foot Discomfort   Reevaluation:  After the interventions noted above, I reevaluated the patient and found that they have: stayed the same  Disposition:  After consideration of the diagnostic results and the patients response to treatment, I feel that the patent would benefit from close outpatient followup.          Final Clinical Impression(s) / ED Diagnoses Final diagnoses:  Foot pain, right  Foot pain, left    Rx / DC Orders ED Discharge Orders     None         Wynetta Fines, MD 09/26/23 2221

## 2023-09-30 ENCOUNTER — Ambulatory Visit (INDEPENDENT_AMBULATORY_CARE_PROVIDER_SITE_OTHER): Payer: Medicare HMO | Admitting: Podiatry

## 2023-09-30 DIAGNOSIS — S91301A Unspecified open wound, right foot, initial encounter: Secondary | ICD-10-CM | POA: Diagnosis not present

## 2023-09-30 DIAGNOSIS — S91302A Unspecified open wound, left foot, initial encounter: Secondary | ICD-10-CM | POA: Diagnosis not present

## 2023-09-30 DIAGNOSIS — Z91199 Patient's noncompliance with other medical treatment and regimen due to unspecified reason: Secondary | ICD-10-CM

## 2023-09-30 NOTE — Progress Notes (Signed)
No show

## 2023-10-06 ENCOUNTER — Other Ambulatory Visit: Payer: Self-pay

## 2023-10-06 ENCOUNTER — Encounter (HOSPITAL_COMMUNITY): Payer: Self-pay | Admitting: Emergency Medicine

## 2023-10-06 ENCOUNTER — Emergency Department (HOSPITAL_COMMUNITY)
Admission: EM | Admit: 2023-10-06 | Discharge: 2023-10-07 | Disposition: A | Discharge status: Home / Self Care | Payer: Medicare HMO | Attending: Emergency Medicine | Admitting: Emergency Medicine

## 2023-10-06 DIAGNOSIS — Z79899 Other long term (current) drug therapy: Secondary | ICD-10-CM | POA: Diagnosis not present

## 2023-10-06 DIAGNOSIS — I4891 Unspecified atrial fibrillation: Secondary | ICD-10-CM | POA: Insufficient documentation

## 2023-10-06 DIAGNOSIS — F0390 Unspecified dementia without behavioral disturbance: Secondary | ICD-10-CM | POA: Diagnosis not present

## 2023-10-06 DIAGNOSIS — E119 Type 2 diabetes mellitus without complications: Secondary | ICD-10-CM | POA: Insufficient documentation

## 2023-10-06 DIAGNOSIS — Z7984 Long term (current) use of oral hypoglycemic drugs: Secondary | ICD-10-CM | POA: Diagnosis not present

## 2023-10-06 DIAGNOSIS — M79672 Pain in left foot: Secondary | ICD-10-CM | POA: Diagnosis not present

## 2023-10-06 DIAGNOSIS — I10 Essential (primary) hypertension: Secondary | ICD-10-CM | POA: Diagnosis not present

## 2023-10-06 DIAGNOSIS — Z7901 Long term (current) use of anticoagulants: Secondary | ICD-10-CM | POA: Diagnosis not present

## 2023-10-06 DIAGNOSIS — B353 Tinea pedis: Secondary | ICD-10-CM | POA: Insufficient documentation

## 2023-10-06 DIAGNOSIS — L853 Xerosis cutis: Secondary | ICD-10-CM | POA: Insufficient documentation

## 2023-10-06 LAB — CBC WITH DIFFERENTIAL/PLATELET
Abs Immature Granulocytes: 0.01 10*3/uL (ref 0.00–0.07)
Basophils Absolute: 0 10*3/uL (ref 0.0–0.1)
Basophils Relative: 0 %
Eosinophils Absolute: 0 10*3/uL (ref 0.0–0.5)
Eosinophils Relative: 0 %
HCT: 32.8 % — ABNORMAL LOW (ref 36.0–46.0)
Hemoglobin: 10.5 g/dL — ABNORMAL LOW (ref 12.0–15.0)
Immature Granulocytes: 0 %
Lymphocytes Relative: 33 %
Lymphs Abs: 2.4 10*3/uL (ref 0.7–4.0)
MCH: 30.8 pg (ref 26.0–34.0)
MCHC: 32 g/dL (ref 30.0–36.0)
MCV: 96.2 fL (ref 80.0–100.0)
Monocytes Absolute: 0.7 10*3/uL (ref 0.1–1.0)
Monocytes Relative: 9 %
Neutro Abs: 4.2 10*3/uL (ref 1.7–7.7)
Neutrophils Relative %: 58 %
Platelets: 356 10*3/uL (ref 150–400)
RBC: 3.41 MIL/uL — ABNORMAL LOW (ref 3.87–5.11)
RDW: 12.9 % (ref 11.5–15.5)
WBC: 7.3 10*3/uL (ref 4.0–10.5)
nRBC: 0 % (ref 0.0–0.2)

## 2023-10-06 LAB — BASIC METABOLIC PANEL
Anion gap: 12 (ref 5–15)
BUN: 8 mg/dL (ref 8–23)
CO2: 22 mmol/L (ref 22–32)
Calcium: 9.6 mg/dL (ref 8.9–10.3)
Chloride: 104 mmol/L (ref 98–111)
Creatinine, Ser: 0.78 mg/dL (ref 0.44–1.00)
GFR, Estimated: >60 mL/min (ref >60)
Glucose, Bld: 78 mg/dL (ref 70–99)
Potassium: 3.5 mmol/L (ref 3.5–5.1)
Sodium: 138 mmol/L (ref 135–145)

## 2023-10-06 NOTE — ED Triage Notes (Addendum)
Pt in with pain and peeling to bilateral feet, ongoing x few months. Ambulatory into triage, states "I just can't over this weakness"

## 2023-10-07 ENCOUNTER — Ambulatory Visit: Payer: Medicare HMO | Admitting: Podiatry

## 2023-10-07 MED ORDER — TERBINAFINE HCL 1 % EX CREA: 1.0000 | TOPICAL_CREAM | Freq: Two times a day (BID) | CUTANEOUS | 0 refills | Status: DC

## 2023-10-07 NOTE — ED Provider Notes (Signed)
[place] EMERGENCY DEPARTMENT AT [facility] Provider Note  CSN: [id number] Arrival date & time: 10/06/23 1913  Chief Complaint(s) Blisters on Feet  HPI Jacqueline Ball is a 82 y.o. female with a past medical history listed below who presents to the emergency department with concern of blisters of bilateral feet.  Patient reports that this has been ongoing for a while.  States that she picks and peels at the skin of her feet.  No fevers or chills.  Patient has been treated before for tinea pedis.  HPI  Past Medical History Past Medical History:  Diagnosis Date   Anxiety    Atrial fibrillation (HCC)    Diabetes mellitus    Hyperlipidemia    Hypertension    Memory loss    Mood disorder (HCC)    Physical exam, annual 10/22/06   Renal cyst    Patient Active Problem List   Diagnosis Date Noted   Dizziness and giddiness 06/16/2023   Hypomagnesemia 05/29/2023   UTI (urinary tract infection) 05/29/2023   Malnutrition of moderate degree 05/29/2023   Hypoglycemia 05/28/2023   Shortness of breath 05/28/2023   Acute midline low back pain without sciatica 11/29/2019   GERD (gastroesophageal reflux disease) 05/12/2019   Arthritis 05/12/2019   Dementia (HCC) 05/12/2019   Hyperlipidemia 05/12/2019   Overactive bladder 05/12/2019   Chronic anticoagulation 03/18/2018   Late onset Alzheimer's disease without behavioral disturbance (HCC) 01/21/2017   Chronic atrial fibrillation (HCC) 11/02/2015   Essential hypertension 11/02/2015   Type 2 diabetes mellitus without complication, without long-term current use of insulin (HCC) 11/02/2015   Cognitive impairment 10/31/2015   Chest pain 11/22/2014   Muscle cramps 04/17/2012   Bilateral shoulder pain 03/09/2012   Essential hypertension 03/28/2011   Diabetes mellitus type 2, noninsulin dependent (HCC) 03/28/2011   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 Authorizing Provider  terbinafine (LAMISIL AT) 1 % cream Apply 1 Application topically 2 (two) times daily. 10/07/23  Yes Broedy Osbourne, Amadeo Garnet, MD  ACCU-CHEK GUIDE test strip 1 each by Other route See admin instructions. USE AS DIRECTED UP TO THREE TIMES DAILY 01/16/21   [provider]  acetaminophen (TYLENOL) 500 MG tablet Take 500 mg by mouth every 6 (six) hours as needed for pain.    [provider]  apixaban (ELIQUIS) 5 MG TABS tablet Take 1 tablet (5 mg total) by mouth 2 (two) times daily. 11/24/14   Rinaldo Cloud, MD  atorvastatin (LIPITOR) 20 MG tablet Take 20 mg by mouth every evening.    [provider]  azelastine (ASTELIN) 0.1 % nasal spray Place 1 spray into both nostrils as needed for rhinitis or allergies.  05/24/18   [provider]  Blood Glucose Monitoring Suppl (ACCU-CHEK GUIDE) w/Device KIT 1 each by Other route See admin instructions. USE AS DIRECTED UP TO THREE TIMES DAILY 01/16/21   [provider]  cyclobenzaprine (FLEXERIL) 5 MG tablet Take 1-2 tablets (5-10 mg total) by mouth at bedtime. 06/06/20   Wieters, Hallie C, PA-C  diclofenac Sodium (VOLTAREN) 1 % GEL Apply 2 g topically 4 (four) times daily. 09/13/23   Jeannie Fend, PA-C  donepezil (ARICEPT) 10 MG tablet Take 1 tablet (10 mg total) by mouth at bedtime. 06/06/17   Marvel Plan, MD  esomeprazole (NEXIUM) 40 MG capsule Take 40 mg by mouth daily at 12 noon.    [provider]  fluconazole (  DIFLUCAN) 150 MG tablet Take 150 mg by mouth once.    [provider]  fluticasone (FLONASE) 50 MCG/ACT nasal spray Place 1 spray into both nostrils daily. Patient taking differently: Place 1 spray into both nostrils daily as needed for allergies. 09/15/18   Dahlia Byes A, NP  gabapentin (NEURONTIN) 100 MG capsule Take 100 mg by mouth at bedtime.    [provider]  insulin glargine (LANTUS) 100 UNIT/ML injection Inject 0.12 mLs (12 Units total) into  the skin every evening. 05/29/23   Osvaldo Shipper, MD  losartan (COZAAR) 25 MG tablet Take 25 mg by mouth daily. 05/03/20   [provider]  metFORMIN (GLUCOPHAGE) 500 MG tablet Take 1 tablet (500 mg total) by mouth 2 (two) times daily with a meal. 06/29/22   Charlynne Pander, MD  mupirocin ointment (BACTROBAN) 2 % Apply 1 Application topically 2 (two) times daily. To affected area till better 08/13/23   Zenia Resides, MD  ondansetron (ZOFRAN) 4 MG tablet Take 1 tablet (4 mg total) by mouth every 6 (six) hours. 06/26/23   Monna Fam, MD  traMADol (ULTRAM) 50 MG tablet Take 1 tablet (50 mg total) by mouth 2 (two) times daily as needed for severe pain. for pain 11/23/21   Antony Madura, PA-C                                                                                                                                    Allergies Metoprolol  Review of Systems Review of Systems As noted in HPI  Physical Exam Vital Signs  I have reviewed the triage vital signs BP 121/79   Pulse 76   Temp 98 F (36.7 C)   Resp 16   Wt 60.8 kg   SpO2 99%   BMI 23.00 kg/m   Physical Exam Vitals reviewed.  Constitutional:      General: She is not in acute distress.    Appearance: She is well-developed. She is not diaphoretic.  HENT:     Head: Normocephalic and atraumatic.     Right Ear: External ear normal.     Left Ear: External ear normal.     Nose: Nose normal.  Eyes:     General: No scleral icterus.    Conjunctiva/sclera: Conjunctivae normal.  Neck:     Trachea: Phonation normal.  Cardiovascular:     Rate and Rhythm: Normal rate and regular rhythm.  Pulmonary:     Effort: Pulmonary effort is normal. No respiratory distress.     Breath sounds: No stridor.  Abdominal:     General: There is no distension.  Musculoskeletal:        General: Normal range of motion.     Cervical back: Normal range of motion.  Feet:     Right foot:     Skin integrity: Dry skin present. No ulcer,  blister or erythema.     Toenail  Condition: Fungal disease present.    Left foot:     Skin integrity: Dry skin present. No ulcer, blister or erythema.     Toenail Condition: Fungal disease present. Neurological:     Mental Status: She is alert and oriented to person, place, and time.  Psychiatric:        Behavior: Behavior normal.     ED Results and Treatments Labs (all labs ordered are listed, but only abnormal results are displayed) Labs Reviewed  CBC WITH DIFFERENTIAL/PLATELET - Abnormal; Notable for the following components:      Result Value   RBC 3.41 (*)    Hemoglobin 10.5 (*)    HCT 32.8 (*)    All other components within normal limits  BASIC METABOLIC PANEL                                                                                                                         EKG  EKG Interpretation Date/Time:    Ventricular Rate:    PR Interval:    QRS Duration:    QT Interval:    QTC Calculation:   R Axis:      Text Interpretation:         Radiology No results found.  Medications Ordered in ED Medications - No data to display Procedures Procedures  (including critical care time) Medical Decision Making / ED Course   Medical Decision Making Amount and/or Complexity of Data Reviewed Labs: ordered.     Labs drawn in triage were grossly reassuring. Presentation and exam is consistent with tinea pedis.  Patient recommended use of antifungal cream as well as moisturizing ointments.    Final Clinical Impression(s) / ED Diagnoses Final diagnoses:  Dry skin  Tinea pedis of both feet   The patient appears reasonably screened and/or stabilized for discharge and I doubt any other medical condition or other [facility] requiring further screening, evaluation, or treatment in the ED at this time. I have discussed the findings, Dx and Tx plan with the patient/family who expressed understanding and agree(s) with the plan. Discharge instructions discussed at length.  The patient/family was given strict return precautions who verbalized understanding of the instructions. No further questions at time of discharge.  Disposition: Discharge  Condition: Good  ED Discharge Orders          Ordered    terbinafine (LAMISIL AT) 1 % cream  2 times daily        10/07/23 0100              Follow Up: Wilmer Floor, NP [address] [place] [address] [phone]  Call  to schedule an appointment for close follow up    This chart was dictated using voice recognition software.  Despite best efforts to proofread,  errors can occur which can change the documentation meaning.    Nira Conn, MD 10/07/23 [phone]

## 2023-10-17 ENCOUNTER — Ambulatory Visit (HOSPITAL_COMMUNITY)
Admission: EM | Admit: 2023-10-17 | Discharge: 2023-10-17 | Disposition: A | Discharge status: Home / Self Care | Payer: Medicare HMO | Attending: Internal Medicine | Admitting: Internal Medicine

## 2023-10-17 ENCOUNTER — Encounter (HOSPITAL_COMMUNITY): Payer: Self-pay

## 2023-10-17 DIAGNOSIS — H1013 Acute atopic conjunctivitis, bilateral: Secondary | ICD-10-CM

## 2023-10-17 MED ORDER — OLOPATADINE HCL 0.1 % OP SOLN: 1.0000 [drp] | Freq: Two times a day (BID) | OPHTHALMIC | 12 refills | Status: DC

## 2023-10-17 NOTE — Discharge Instructions (Signed)
Olopatadine as needed to both eyes to help with burning and drainage. This is due to allergies. Use warm compresses to help with symptoms further.  If you develop any new or worsening symptoms or if your symptoms do not start to improve, please return here or follow-up with your primary care provider. If your symptoms are severe, please go to the emergency room.

## 2023-10-17 NOTE — ED Provider Notes (Signed)
[facility]    CSN: [id number] Arrival date & time: 10/17/23  1748      History   Chief Complaint No chief complaint on file.   HPI Jacqueline Ball is a 82 y.o. female.   Patient with history of dementia presents to urgent care with her daughter for evaluation of burning sensation and watery eye drainage to both eyes that started yesterday.  She is not experiencing any rhinorrhea, cough, sneezing, fever, chills, or crusty drainage from both eyes.  No redness or swelling to the eyes. Denies recent injuries to the eyes. She has not attempted use of any OTC medicines PTA.      Past Medical History:  Diagnosis Date   Anxiety    Atrial fibrillation (HCC)    Diabetes mellitus    Hyperlipidemia    Hypertension    Memory loss    Mood disorder [facility])    Physical exam, annual 10/22/06   Renal cyst     Patient Active Problem List   Diagnosis Date Noted   Dizziness and giddiness 06/16/2023   Hypomagnesemia 05/29/2023   UTI (urinary tract infection) 05/29/2023   Malnutrition of moderate degree 05/29/2023   Hypoglycemia 05/28/2023   Shortness of breath 05/28/2023   Acute midline low back pain without sciatica 11/29/2019   GERD (gastroesophageal reflux disease) 05/12/2019   Arthritis 05/12/2019   Dementia (HCC) 05/12/2019   Hyperlipidemia 05/12/2019   Overactive bladder 05/12/2019   Chronic anticoagulation 03/18/2018   Late onset Alzheimer's disease without behavioral disturbance (HCC) 01/21/2017   Chronic atrial fibrillation (HCC) 11/02/2015   Essential hypertension 11/02/2015   Type 2 diabetes mellitus without complication, without long-term current use of insulin (HCC) 11/02/2015   Cognitive impairment 10/31/2015   Chest pain 11/22/2014   Muscle cramps 04/17/2012   Bilateral shoulder pain 03/09/2012   Essential hypertension 03/28/2011   Diabetes mellitus type 2, noninsulin dependent (HCC) 03/28/2011   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      Home Medications    Prior to Admission medications   Medication Sig Start Date End Date Taking? Authorizing Provider  olopatadine (PATANOL) 0.1 % ophthalmic solution Place 1 drop into both eyes 2 (two) times daily. 10/17/23  Yes StanhopeDonavan Burnet, FNP  ACCU-CHEK GUIDE test strip 1 each by Other route See admin instructions. USE AS DIRECTED UP TO THREE TIMES DAILY 01/16/21   [provider]  acetaminophen (TYLENOL) 500 MG tablet Take 500 mg by mouth every 6 (six) hours as needed for pain.    [provider]  apixaban (ELIQUIS) 5 MG TABS tablet Take 1 tablet (5 mg total) by mouth 2 (two) times daily. 11/24/14   Rinaldo Cloud, MD  atorvastatin (LIPITOR) 20 MG tablet Take 20 mg by mouth every evening.    [provider]  azelastine (ASTELIN) 0.1 % nasal spray Place 1 spray into both nostrils as needed for rhinitis or allergies.  05/24/18   [provider]  Blood Glucose Monitoring Suppl (ACCU-CHEK GUIDE) w/Device KIT 1 each by Other route See admin instructions. USE AS DIRECTED UP TO THREE TIMES DAILY 01/16/21   [provider]  cyclobenzaprine (FLEXERIL) 5 MG tablet Take 1-2 tablets (5-10 mg total) by mouth at bedtime. 06/06/20   Wieters, Hallie C, PA-C  diclofenac Sodium (VOLTAREN) 1 % GEL Apply 2 g topically 4 (four)  times daily. 09/13/23   Jeannie Fend, PA-C  donepezil (ARICEPT) 10 MG tablet Take 1 tablet (10 mg total) by mouth at bedtime. 06/06/17   Marvel Plan, MD  esomeprazole (NEXIUM) 40 MG capsule Take 40 mg by mouth daily at 12 noon.    [provider]  fluconazole (DIFLUCAN) 150 MG tablet Take 150 mg by mouth once.    [provider]  fluticasone (FLONASE) 50 MCG/ACT nasal spray Place 1 spray into both nostrils daily. Patient taking differently: Place 1 spray into both nostrils daily as needed for allergies.  09/15/18   Dahlia Byes A, NP  gabapentin (NEURONTIN) 100 MG capsule Take 100 mg by mouth at bedtime.    [provider]  insulin glargine (LANTUS) 100 UNIT/ML injection Inject 0.12 mLs (12 Units total) into the skin every evening. 05/29/23   Osvaldo Shipper, MD  losartan (COZAAR) 25 MG tablet Take 25 mg by mouth daily. 05/03/20   [provider]  metFORMIN (GLUCOPHAGE) 500 MG tablet Take 1 tablet (500 mg total) by mouth 2 (two) times daily with a meal. 06/29/22   Charlynne Pander, MD  mupirocin ointment (BACTROBAN) 2 % Apply 1 Application topically 2 (two) times daily. To affected area till better 08/13/23   Zenia Resides, MD  ondansetron (ZOFRAN) 4 MG tablet Take 1 tablet (4 mg total) by mouth every 6 (six) hours. 06/26/23   Monna Fam, MD  terbinafine (LAMISIL AT) 1 % cream Apply 1 Application topically 2 (two) times daily. 10/07/23   Nira Conn, MD  traMADol (ULTRAM) 50 MG tablet Take 1 tablet (50 mg total) by mouth 2 (two) times daily as needed for severe pain. for pain 11/23/21   Antony Madura, PA-C    Family History Family History  Problem Relation Age of Onset   Diabetes Sister     Social History Social History   Tobacco Use   Smoking status: Never   Smokeless tobacco: Never  Vaping Use   Vaping status: Never Used  Substance Use Topics   Alcohol use: Never   Drug use: Never     Allergies   Metoprolol   Review of Systems Review of Systems Per HPI  Physical Exam Triage Vital Signs ED Triage Vitals  Encounter Vitals Group     BP 10/17/23 1828 135/85     Systolic BP Percentile --      Diastolic BP Percentile --      Pulse Rate 10/17/23 1828 68     Resp 10/17/23 1828 16     Temp 10/17/23 1828 98.1 F (36.7 C)     Temp Source 10/17/23 1828 Oral     SpO2 10/17/23 1828 98 %     Weight --      Height --      Head Circumference --      Peak Flow --      Pain Score 10/17/23 1830 10     Pain Loc --      Pain Education --       Exclude from Growth Chart --    No data found.  Updated Vital Signs BP 135/85 (BP Location: Left Arm)   Pulse 68   Temp 98.1 F (36.7 C) (Oral)   Resp 16   SpO2 98%   Visual Acuity Right Eye Distance:   Left Eye Distance:   Bilateral Distance:    Right Eye Near:   Left Eye Near:    Bilateral Near:  Physical Exam Vitals and nursing note reviewed.  Constitutional:      Appearance: She is not ill-appearing or toxic-appearing.  HENT:     Head: Normocephalic and atraumatic.     Right Ear: Hearing and external ear normal.     Left Ear: Hearing and external ear normal.     Nose: Nose normal.     Mouth/Throat:     Lips: Pink.  Eyes:     General: Lids are normal. Vision grossly intact. Gaze aligned appropriately.        Right eye: No foreign body, discharge or hordeolum.        Left eye: No foreign body, discharge or hordeolum.     Extraocular Movements: Extraocular movements intact.     Conjunctiva/sclera: Conjunctivae normal.     Right eye: Right conjunctiva is not injected. No chemosis, exudate or hemorrhage.    Left eye: Left conjunctiva is not injected. No chemosis, exudate or hemorrhage. Cardiovascular:     Rate and Rhythm: Normal rate and regular rhythm.     Heart sounds: Normal heart sounds, S1 normal and S2 normal.  Pulmonary:     Effort: Pulmonary effort is normal. No respiratory distress.     Breath sounds: Normal breath sounds and air entry.  Musculoskeletal:     Cervical back: Neck supple.  Skin:    General: Skin is warm and dry.     Capillary Refill: Capillary refill takes less than 2 seconds.     Findings: No rash.  Neurological:     General: No focal deficit present.     Mental Status: She is alert and oriented to person, place, and time. Mental status is at baseline.     Cranial Nerves: No dysarthria or facial asymmetry.  Psychiatric:        Mood and Affect: Mood normal.        Speech: Speech normal.        Behavior: Behavior normal.         Thought Content: Thought content normal.        Judgment: Judgment normal.      UC Treatments / Results  Labs (all labs ordered are listed, but only abnormal results are displayed) Labs Reviewed - No data to display  EKG   Radiology No results found.  Procedures Procedures (including critical care time)  Medications Ordered in UC Medications - No data to display  Initial Impression / Assessment and Plan / UC Course  I have reviewed the triage vital signs and the nursing notes.  Pertinent labs & imaging results that were available during my care of the patient were reviewed by me and considered in my medical decision making (see chart for details).   1. Allergic conjunctivitis of both eyes Olopatadine as needed. Warm compresses. Discharged home with daughter who is driving.  Counseled patient on potential for adverse effects with medications prescribed/recommended today, strict ER and return-to-clinic precautions discussed, patient verbalized understanding.    Final Clinical Impressions(s) / UC Diagnoses   Final diagnoses:  Allergic conjunctivitis of both eyes     Discharge Instructions      Olopatadine as needed to both eyes to help with burning and drainage. This is due to allergies. Use warm compresses to help with symptoms further.  If you develop any new or worsening symptoms or if your symptoms do not start to improve, please return here or follow-up with your primary care provider. If your symptoms are severe, please go to the emergency room.  ED Prescriptions     Medication Sig Dispense Auth. Provider   olopatadine (PATANOL) 0.1 % ophthalmic solution Place 1 drop into both eyes 2 (two) times daily. 5 mL Carlisle Beers, FNP      PDMP not reviewed this encounter.   Carlisle Beers, [place] 10/17/23 1900

## 2023-10-17 NOTE — ED Triage Notes (Signed)
Patient states that she has been having eye burning in both eyes that started today.  Patient denies taking any medication.

## 2023-11-15 ENCOUNTER — Emergency Department (HOSPITAL_COMMUNITY): Admission: EM | Admit: 2023-11-15 | Payer: Medicare HMO | Source: Home / Self Care

## 2023-11-20 DIAGNOSIS — E1142 Type 2 diabetes mellitus with diabetic polyneuropathy: Secondary | ICD-10-CM | POA: Diagnosis not present

## 2023-11-20 DIAGNOSIS — Z794 Long term (current) use of insulin: Secondary | ICD-10-CM | POA: Diagnosis not present

## 2023-11-25 DIAGNOSIS — Z1159 Encounter for screening for other viral diseases: Secondary | ICD-10-CM | POA: Diagnosis not present

## 2023-11-25 DIAGNOSIS — Z23 Encounter for immunization: Secondary | ICD-10-CM | POA: Diagnosis not present

## 2023-11-25 DIAGNOSIS — E11649 Type 2 diabetes mellitus with hypoglycemia without coma: Secondary | ICD-10-CM | POA: Diagnosis not present

## 2023-11-25 DIAGNOSIS — K219 Gastro-esophageal reflux disease without esophagitis: Secondary | ICD-10-CM | POA: Diagnosis not present

## 2023-11-25 DIAGNOSIS — Z79899 Other long term (current) drug therapy: Secondary | ICD-10-CM | POA: Diagnosis not present

## 2023-12-18 ENCOUNTER — Encounter (HOSPITAL_COMMUNITY): Payer: Self-pay

## 2023-12-18 ENCOUNTER — Emergency Department (HOSPITAL_COMMUNITY)
Admission: EM | Admit: 2023-12-18 | Discharge: 2023-12-19 | Disposition: A | Discharge status: Home / Self Care | Payer: Medicare HMO | Attending: Emergency Medicine | Admitting: Emergency Medicine

## 2023-12-18 ENCOUNTER — Emergency Department (HOSPITAL_COMMUNITY): Payer: Medicare HMO

## 2023-12-18 ENCOUNTER — Other Ambulatory Visit: Payer: Self-pay

## 2023-12-18 DIAGNOSIS — E860 Dehydration: Secondary | ICD-10-CM | POA: Diagnosis not present

## 2023-12-18 DIAGNOSIS — Z7984 Long term (current) use of oral hypoglycemic drugs: Secondary | ICD-10-CM | POA: Diagnosis not present

## 2023-12-18 DIAGNOSIS — Z7901 Long term (current) use of anticoagulants: Secondary | ICD-10-CM | POA: Insufficient documentation

## 2023-12-18 DIAGNOSIS — F0390 Unspecified dementia without behavioral disturbance: Secondary | ICD-10-CM | POA: Diagnosis not present

## 2023-12-18 DIAGNOSIS — R4182 Altered mental status, unspecified: Secondary | ICD-10-CM | POA: Diagnosis not present

## 2023-12-18 DIAGNOSIS — I10 Essential (primary) hypertension: Secondary | ICD-10-CM | POA: Insufficient documentation

## 2023-12-18 DIAGNOSIS — E119 Type 2 diabetes mellitus without complications: Secondary | ICD-10-CM | POA: Insufficient documentation

## 2023-12-18 DIAGNOSIS — R531 Weakness: Secondary | ICD-10-CM

## 2023-12-18 LAB — CBC
HCT: 37.8 % (ref 36.0–46.0)
Hemoglobin: 12.3 g/dL (ref 12.0–15.0)
MCH: 31.4 pg (ref 26.0–34.0)
MCHC: 32.5 g/dL (ref 30.0–36.0)
MCV: 96.4 fL (ref 80.0–100.0)
Platelets: 405 10*3/uL — ABNORMAL HIGH (ref 150–400)
RBC: 3.92 MIL/uL (ref 3.87–5.11)
RDW: 13.2 % (ref 11.5–15.5)
WBC: 6.9 10*3/uL (ref 4.0–10.5)
nRBC: 0 % (ref 0.0–0.2)

## 2023-12-18 LAB — TROPONIN I (HIGH SENSITIVITY)
Troponin I (High Sensitivity): 3 ng/L (ref <18)
Troponin I (High Sensitivity): 3 ng/L (ref <18)

## 2023-12-18 LAB — COMPREHENSIVE METABOLIC PANEL
ALT: 12 U/L (ref 0–44)
AST: 19 U/L (ref 15–41)
Albumin: 4 g/dL (ref 3.5–5.0)
Alkaline Phosphatase: 70 U/L (ref 38–126)
Anion gap: 20 — ABNORMAL HIGH (ref 5–15)
BUN: 24 mg/dL — ABNORMAL HIGH (ref 8–23)
CO2: 17 mmol/L — ABNORMAL LOW (ref 22–32)
Calcium: 9.3 mg/dL (ref 8.9–10.3)
Chloride: 100 mmol/L (ref 98–111)
Creatinine, Ser: 1.03 mg/dL — ABNORMAL HIGH (ref 0.44–1.00)
GFR, Estimated: 55 mL/min — ABNORMAL LOW (ref >60)
Glucose, Bld: 51 mg/dL — ABNORMAL LOW (ref 70–99)
Potassium: 3.5 mmol/L (ref 3.5–5.1)
Sodium: 137 mmol/L (ref 135–145)
Total Bilirubin: 1 mg/dL (ref 0.0–1.2)
Total Protein: 7.7 g/dL (ref 6.5–8.1)

## 2023-12-18 LAB — AMMONIA: Ammonia: 13 umol/L (ref 9–35)

## 2023-12-18 MED ORDER — SODIUM CHLORIDE 0.9 % IV BOLUS
1000.0000 mL | Freq: Once | INTRAVENOUS | Status: AC
  Administered 2023-12-19: 1000 mL via INTRAVENOUS

## 2023-12-18 NOTE — ED Provider Notes (Signed)
 [place] EMERGENCY DEPARTMENT AT [facility] Provider Note   CSN: [id number] Arrival date & time: 12/18/23  1140     History  Chief Complaint  Patient presents with   Weakness    Jacqueline Ball is a 83 y.o. female.  HPI     This is an 83 year old female who presents with her family with concerns for generalized weakness.  Family at bedside states that she has been generally weak with difficulty walking.  Also reporting dizziness with standing.  Patient denies any room spinning dizziness.  She states that her family is blowing things out of proportion.  She does not have any pain at the moment although her family states that she has had intermittent abdominal and chest pain.  She is also had weight loss.  Generally has had decreased p.o. intake.  No fevers or cough.  No known sick contacts.  No noted speech deficits.  Home Medications Prior to Admission medications   Medication Sig Start Date End Date Taking? Authorizing Provider  ACCU-CHEK GUIDE test strip 1 each by Other route See admin instructions. USE AS DIRECTED UP TO THREE TIMES DAILY 01/16/21   [provider]  acetaminophen  (TYLENOL ) 500 MG tablet Take 500 mg by mouth every 6 (six) hours as needed for pain.    [provider]  apixaban  (ELIQUIS ) 5 MG TABS tablet Take 1 tablet (5 mg total) by mouth 2 (two) times daily. 11/24/14   Levern Hutching, MD  atorvastatin  (LIPITOR) 20 MG tablet Take 20 mg by mouth every evening.    [provider]  azelastine  (ASTELIN ) 0.1 % nasal spray Place 1 spray into both nostrils as needed for rhinitis or allergies.  05/24/18   [provider]  Blood Glucose Monitoring Suppl (ACCU-CHEK GUIDE) w/Device KIT 1 each by Other route See admin instructions. USE AS DIRECTED UP TO THREE TIMES DAILY 01/16/21   [provider]  cyclobenzaprine  (FLEXERIL ) 5 MG tablet Take 1-2 tablets (5-10 mg total) by mouth at bedtime. 06/06/20   Wieters, Hallie C, PA-C   diclofenac  Sodium (VOLTAREN ) 1 % GEL Apply 2 g topically 4 (four) times daily. 09/13/23   Beverley Leita LABOR, PA-C  donepezil  (ARICEPT ) 10 MG tablet Take 1 tablet (10 mg total) by mouth at bedtime. 06/06/17   Jerri Pfeiffer, MD  esomeprazole  (NEXIUM ) 40 MG capsule Take 40 mg by mouth daily at 12 noon.    [provider]  fluconazole  (DIFLUCAN ) 150 MG tablet Take 150 mg by mouth once.    [provider]  fluticasone  (FLONASE ) 50 MCG/ACT nasal spray Place 1 spray into both nostrils daily. Patient taking differently: Place 1 spray into both nostrils daily as needed for allergies. 09/15/18   Adah Corning A, NP  gabapentin  (NEURONTIN ) 100 MG capsule Take 100 mg by mouth at bedtime.    [provider]  insulin  glargine (LANTUS ) 100 UNIT/ML injection Inject 0.12 mLs (12 Units total) into the skin every evening. 05/29/23   Krishnan, Gokul, MD  losartan  (COZAAR ) 25 MG tablet Take 25 mg by mouth daily. 05/03/20   [provider]  metFORMIN  (GLUCOPHAGE ) 500 MG tablet Take 1 tablet (500 mg total) by mouth 2 (two) times daily with a meal. 06/29/22   Patt Alm Macho, MD  mupirocin  ointment (BACTROBAN ) 2 % Apply 1 Application topically 2 (two) times daily. To affected area till better 08/13/23   Vonna Sharlet POUR, MD  olopatadine  (PATANOL) 0.1 % ophthalmic solution Place 1 drop into both eyes 2 (  two) times daily. 10/17/23   Enedelia Dorna HERO, FNP  ondansetron  (ZOFRAN ) 4 MG tablet Take 1 tablet (4 mg total) by mouth every 6 (six) hours. 06/26/23   Francella Rogue, MD  terbinafine  (LAMISIL  AT) 1 % cream Apply 1 Application topically 2 (two) times daily. 10/07/23   Trine Raynell Moder, MD  traMADol  (ULTRAM ) 50 MG tablet Take 1 tablet (50 mg total) by mouth 2 (two) times daily as needed for severe pain. for pain 11/23/21   Keith Sor, PA-C      Allergies    Metoprolol     Review of Systems   Review of Systems  Constitutional:  Positive for unexpected weight change. Negative for  fever.  Respiratory:  Negative for shortness of breath.   Cardiovascular:  Negative for chest pain.  Gastrointestinal:  Negative for abdominal pain.  Neurological:  Positive for dizziness.  All other systems reviewed and are negative.   Physical Exam Updated Vital Signs BP 127/70   Pulse 75   Temp 97.7 F (36.5 C) (Oral)   Resp 18   Ht 1.626 m (5' 4)   Wt 60.8 kg   SpO2 99%   BMI 23.01 kg/m  Physical Exam Vitals and nursing note reviewed.  Constitutional:      Appearance: She is well-developed.     Comments: Thin, elderly, no acute distress  HENT:     Head: Normocephalic and atraumatic.     Mouth/Throat:     Mouth: Mucous membranes are dry.  Eyes:     Pupils: Pupils are equal, round, and reactive to light.  Cardiovascular:     Rate and Rhythm: Normal rate and regular rhythm.     Heart sounds: Normal heart sounds.  Pulmonary:     Effort: Pulmonary effort is normal. No respiratory distress.     Breath sounds: No wheezing.  Abdominal:     Palpations: Abdomen is soft.     Tenderness: There is no abdominal tenderness.  Musculoskeletal:     Cervical back: Neck supple.  Skin:    General: Skin is warm and dry.  Neurological:     Mental Status: She is alert and oriented to person, place, and time.     Comments: Cranial nerves II through XII intact, 5 out of 5 strength in all 4 extremities, no dysmetria to finger-nose-finger  Psychiatric:        Mood and Affect: Mood normal.     ED Results / Procedures / Treatments   Labs (all labs ordered are listed, but only abnormal results are displayed) Labs Reviewed  CBC - Abnormal; Notable for the following components:      Result Value   Platelets 405 (*)    All other components within normal limits  COMPREHENSIVE METABOLIC PANEL - Abnormal; Notable for the following components:   CO2 17 (*)    Glucose, Bld 51 (*)    BUN 24 (*)    Creatinine, Ser 1.03 (*)    GFR, Estimated 55 (*)    Anion gap 20 (*)    All other  components within normal limits  URINALYSIS, ROUTINE W REFLEX MICROSCOPIC - Abnormal; Notable for the following components:   Ketones, ur 20 (*)    Protein, ur 30 (*)    Bacteria, UA RARE (*)    All other components within normal limits  AMMONIA  CBG MONITORING, ED  TROPONIN I (HIGH SENSITIVITY)  TROPONIN I (HIGH SENSITIVITY)    EKG None  Radiology CT Head Wo Contrast Result Date:  12/18/2023 CLINICAL DATA:  Mental status change, unknown cause. EXAM: CT HEAD WITHOUT CONTRAST TECHNIQUE: Contiguous axial images were obtained from the base of the skull through the vertex without intravenous contrast. RADIATION DOSE REDUCTION: This exam was performed according to the departmental dose-optimization program which includes automated exposure control, adjustment of the mA and/or kV according to patient size and/or use of iterative reconstruction technique. COMPARISON:  Head CT 07/23/2023. FINDINGS: Brain: No acute intracranial hemorrhage. Gray-white differentiation is preserved. No hydrocephalus or extra-axial collection. No mass effect or midline shift. Vascular: No hyperdense vessel or unexpected calcification. Skull: No calvarial fracture or suspicious bone lesion. Skull base is unremarkable. Sinuses/Orbits: No acute finding. Other: None. IMPRESSION: No acute intracranial abnormality. Electronically Signed   By: Ryan Chess M.D.   On: 12/18/2023 13:52   DG Chest Portable 1 View Result Date: 12/18/2023 CLINICAL DATA:  Dementia.  Possible gait disturbance. EXAM: PORTABLE CHEST 1 VIEW COMPARISON:  08/23/2023 FINDINGS: Lungs are adequately inflated and otherwise clear. Cardiomediastinal silhouette and remainder of the exam is unchanged. IMPRESSION: No active disease. Electronically Signed   By: Toribio Agreste M.D.   On: 12/18/2023 13:36    Procedures Procedures    Medications Ordered in ED Medications  sodium chloride  0.9 % bolus 1,000 mL (1,000 mLs Intravenous Bolus 12/19/23 0010)    ED Course/  Medical Decision Making/ A&P Clinical Course as of 12/19/23 0412  Thu Dec 19, 2023  0412 Repeat glucose 92.  Patient tolerating p.o. without difficulty. [CH]    Clinical Course User Index [CH] Arlyce Circle, Charmaine FALCON, MD                                 Medical Decision Making  This patient presents to the ED for concern of weakness, this involves an extensive number of treatment options, and is a complaint that carries with it a high risk of complications and morbidity.  I considered the following differential and admission for this acute, potentially life threatening condition.  The differential diagnosis includes dehydration, metabolic derangement, infection, stroke  MDM:    This is a 84 year old female who presents with concerns for generalized weakness.  Family reports difficulty getting up and going and ambulating.  Patient states that she feels fine.  She does not appear to have any significant focal deficits and her vital signs are reassuring.  Labs notable for slightly elevated creatinine at 1.03 this is up from the patient's baseline.  Could reflect some dehydration.  Glucose on her CMP is 51.  She is awake, alert, oriented.  She does generally look dehydrated and malnourished.  She was given fluids.  She was able to p.o. challenge without difficulty.  She ambulated independently to the bathroom to Friday urine sample without difficulty.  Urinalysis is not consistent with UTI.  Imaging including CT scan and chest x-ray are reassuring.  No focal deficits to suggest stroke.  (Labs, imaging, consults)  Labs: I Ordered, and personally interpreted labs.  The pertinent results include: CBC, CMP, urinalysis, ammonia  Imaging Studies ordered: I ordered imaging studies including chest x-ray, CT head I independently visualized and interpreted imaging. I agree with the radiologist interpretation  Additional history obtained from family.  External records from outside source obtained and reviewed  including prior evaluations  Cardiac Monitoring: The patient was maintained on a cardiac monitor.  If on the cardiac monitor, I personally viewed and interpreted the cardiac monitored which showed an  underlying rhythm of: Sinus rhythm  Reevaluation: After the interventions noted above, I reevaluated the patient and found that they have :improved  Social Determinants of Health:  lives with family  Disposition: Discharge  Co morbidities that complicate the patient evaluation  Past Medical History:  Diagnosis Date   Anxiety    Atrial fibrillation (HCC)    Diabetes mellitus    Hyperlipidemia    Hypertension    Memory loss    Mood disorder (HCC)    Physical exam, annual 10/22/06   Renal cyst      Medicines Meds ordered this encounter  Medications   sodium chloride  0.9 % bolus 1,000 mL    I have reviewed the patients home medicines and have made adjustments as needed  Problem List / ED Course: Problem List Items Addressed This Visit   None Visit Diagnoses       Dehydration    -  Primary     Weakness                       Final Clinical Impression(s) / ED Diagnoses Final diagnoses:  Dehydration  Weakness    Rx / DC Orders ED Discharge Orders     None         Bari Charmaine FALCON, MD 12/19/23 [phone]

## 2023-12-18 NOTE — ED Triage Notes (Signed)
 Family member reports that pt has not been walking and eating as normal. Pt seems confused per baseline (reported by family).

## 2023-12-18 NOTE — ED Provider Triage Note (Signed)
 Emergency Medicine Provider Triage Evaluation Note  Jacqueline Ball , a 83 y.o. female  was evaluated in triage.  Pts daughter brings her in with complaints of not walking like normal, not eating like normal.  Confused at baseline per patient.  She has dementia/Alzheimer's.  Daughter reports that for few days she has not really been eating or drinking, is not walking without assistance.  Review of Systems  Positive: Weakness, confusion Negative: Chest pain, shob  Physical Exam  BP 111/74 (BP Location: Right Arm)   Pulse 80   Temp 97.9 F (36.6 C) (Oral)   Resp 12   Ht 5' 4 (1.626 m)   Wt 60.8 kg   SpO2 100%   BMI 23.01 kg/m  Gen:   Awake, no distress   Resp:  Normal effort  MSK:   Moves extremities without difficulty  Other:  Confused, but follows commands, able to stand and take a few steps with assistance  Medical Decision Making  Medically screening exam initiated at 12:29 PM.  Appropriate orders placed.  Jacqueline Ball was informed that the remainder of the evaluation will be completed by another provider, this initial triage assessment does not replace that evaluation, and the importance of remaining in the ED until their evaluation is complete.  Workup initiated in triage    Rosan Sherlean DEL, PA-C 12/18/23 1230

## 2023-12-19 LAB — URINALYSIS, ROUTINE W REFLEX MICROSCOPIC
Bilirubin Urine: NEGATIVE
Glucose, UA: NEGATIVE mg/dL
Hgb urine dipstick: NEGATIVE
Ketones, ur: 20 mg/dL — AB
Leukocytes,Ua: NEGATIVE
Nitrite: NEGATIVE
Protein, ur: 30 mg/dL — AB
Specific Gravity, Urine: 1.018 (ref 1.005–1.030)
pH: 5 (ref 5.0–8.0)

## 2023-12-19 LAB — CBG MONITORING, ED: Glucose-Capillary: 92 mg/dL (ref 70–99)

## 2023-12-19 NOTE — Discharge Instructions (Addendum)
 You are seen today for generalized weakness.  You do have some evidence of dehydration.  Make sure you stay hydrated.  Follow-up with your primary doctor.

## 2023-12-19 NOTE — ED Notes (Addendum)
 Pt ambulated to the bathroom and back to room with RW w/out any dificulties. Denies any lightheadedness, dizziness, or SOB.

## 2023-12-21 ENCOUNTER — Emergency Department (HOSPITAL_COMMUNITY)
Admission: EM | Admit: 2023-12-21 | Discharge: 2023-12-22 | Disposition: A | Discharge status: Home / Self Care | Payer: Medicare HMO | Attending: Emergency Medicine | Admitting: Emergency Medicine

## 2023-12-21 ENCOUNTER — Encounter (HOSPITAL_COMMUNITY): Payer: Self-pay

## 2023-12-21 ENCOUNTER — Other Ambulatory Visit: Payer: Self-pay

## 2023-12-21 DIAGNOSIS — R531 Weakness: Secondary | ICD-10-CM | POA: Diagnosis not present

## 2023-12-21 DIAGNOSIS — F0390 Unspecified dementia without behavioral disturbance: Secondary | ICD-10-CM | POA: Diagnosis not present

## 2023-12-21 DIAGNOSIS — I959 Hypotension, unspecified: Secondary | ICD-10-CM | POA: Diagnosis not present

## 2023-12-21 DIAGNOSIS — Z794 Long term (current) use of insulin: Secondary | ICD-10-CM | POA: Insufficient documentation

## 2023-12-21 DIAGNOSIS — R457 State of emotional shock and stress, unspecified: Secondary | ICD-10-CM | POA: Diagnosis not present

## 2023-12-21 DIAGNOSIS — Z7901 Long term (current) use of anticoagulants: Secondary | ICD-10-CM | POA: Diagnosis not present

## 2023-12-21 DIAGNOSIS — R630 Anorexia: Secondary | ICD-10-CM | POA: Insufficient documentation

## 2023-12-21 DIAGNOSIS — R627 Adult failure to thrive: Secondary | ICD-10-CM | POA: Diagnosis not present

## 2023-12-21 DIAGNOSIS — Z7984 Long term (current) use of oral hypoglycemic drugs: Secondary | ICD-10-CM | POA: Diagnosis not present

## 2023-12-21 DIAGNOSIS — R410 Disorientation, unspecified: Secondary | ICD-10-CM | POA: Insufficient documentation

## 2023-12-21 LAB — URINALYSIS, W/ REFLEX TO CULTURE (INFECTION SUSPECTED)
Bacteria, UA: NONE SEEN
Bilirubin Urine: NEGATIVE
Glucose, UA: NEGATIVE mg/dL
Hgb urine dipstick: NEGATIVE
Ketones, ur: NEGATIVE mg/dL
Leukocytes,Ua: NEGATIVE
Nitrite: NEGATIVE
Protein, ur: NEGATIVE mg/dL
Specific Gravity, Urine: 1.01 (ref 1.005–1.030)
pH: 5 (ref 5.0–8.0)

## 2023-12-21 LAB — COMPREHENSIVE METABOLIC PANEL
ALT: 11 U/L (ref 0–44)
AST: 24 U/L (ref 15–41)
Albumin: 3.6 g/dL (ref 3.5–5.0)
Alkaline Phosphatase: 63 U/L (ref 38–126)
Anion gap: 11 (ref 5–15)
BUN: 10 mg/dL (ref 8–23)
CO2: 24 mmol/L (ref 22–32)
Calcium: 9.2 mg/dL (ref 8.9–10.3)
Chloride: 101 mmol/L (ref 98–111)
Creatinine, Ser: 0.72 mg/dL (ref 0.44–1.00)
GFR, Estimated: >60 mL/min (ref >60)
Glucose, Bld: 137 mg/dL — ABNORMAL HIGH (ref 70–99)
Potassium: 3.6 mmol/L (ref 3.5–5.1)
Sodium: 136 mmol/L (ref 135–145)
Total Bilirubin: 0.8 mg/dL (ref 0.0–1.2)
Total Protein: 7.2 g/dL (ref 6.5–8.1)

## 2023-12-21 LAB — CBC WITH DIFFERENTIAL/PLATELET
Abs Immature Granulocytes: 0.02 10*3/uL (ref 0.00–0.07)
Basophils Absolute: 0 10*3/uL (ref 0.0–0.1)
Basophils Relative: 0 %
Eosinophils Absolute: 0 10*3/uL (ref 0.0–0.5)
Eosinophils Relative: 0 %
HCT: 34.4 % — ABNORMAL LOW (ref 36.0–46.0)
Hemoglobin: 11.5 g/dL — ABNORMAL LOW (ref 12.0–15.0)
Immature Granulocytes: 0 %
Lymphocytes Relative: 17 %
Lymphs Abs: 1.5 10*3/uL (ref 0.7–4.0)
MCH: 31 pg (ref 26.0–34.0)
MCHC: 33.4 g/dL (ref 30.0–36.0)
MCV: 92.7 fL (ref 80.0–100.0)
Monocytes Absolute: 0.9 10*3/uL (ref 0.1–1.0)
Monocytes Relative: 11 %
Neutro Abs: 6 10*3/uL (ref 1.7–7.7)
Neutrophils Relative %: 72 %
Platelets: 366 10*3/uL (ref 150–400)
RBC: 3.71 MIL/uL — ABNORMAL LOW (ref 3.87–5.11)
RDW: 13.1 % (ref 11.5–15.5)
WBC: 8.4 10*3/uL (ref 4.0–10.5)
nRBC: 0 % (ref 0.0–0.2)

## 2023-12-21 MED ORDER — INSULIN GLARGINE-YFGN 100 UNIT/ML ~~LOC~~ SOLN: 12.0000 [IU] | Freq: Every day | SUBCUTANEOUS | Status: DC

## 2023-12-21 MED ORDER — APIXABAN 5 MG PO TABS: 5.0000 mg | ORAL_TABLET | Freq: Two times a day (BID) | ORAL | Status: DC

## 2023-12-21 MED ORDER — FLUCONAZOLE 150 MG PO TABS: 150.0000 mg | ORAL_TABLET | Freq: Once | ORAL | Status: DC

## 2023-12-21 MED ORDER — DONEPEZIL HCL 5 MG PO TABS: 10.0000 mg | ORAL_TABLET | Freq: Every day | ORAL | Status: DC

## 2023-12-21 MED ORDER — METFORMIN HCL 500 MG PO TABS
500.0000 mg | ORAL_TABLET | Freq: Two times a day (BID) | ORAL | Status: DC
  Administered 2023-12-22: 500 mg via ORAL
  Filled 2023-12-21: qty 1

## 2023-12-21 MED ORDER — ATORVASTATIN CALCIUM 10 MG PO TABS: 20.0000 mg | ORAL_TABLET | Freq: Every evening | ORAL | Status: DC

## 2023-12-21 MED ORDER — CYCLOBENZAPRINE HCL 10 MG PO TABS: 5.0000 mg | ORAL_TABLET | Freq: Every day | ORAL | Status: DC

## 2023-12-21 MED ORDER — LOSARTAN POTASSIUM 25 MG PO TABS: 25.0000 mg | ORAL_TABLET | Freq: Every day | ORAL | Status: DC

## 2023-12-21 MED ORDER — GABAPENTIN 100 MG PO CAPS: 100.0000 mg | ORAL_CAPSULE | Freq: Every day | ORAL | Status: DC

## 2023-12-21 MED ORDER — PANTOPRAZOLE SODIUM 40 MG PO TBEC: 40.0000 mg | DELAYED_RELEASE_TABLET | Freq: Every day | ORAL | Status: DC

## 2023-12-21 NOTE — ED Provider Notes (Signed)
 [facility] Provider Note   CSN: [id number] Arrival date & time: 12/21/23  1846     History  Chief Complaint  Patient presents with   Failure To Thrive    Jacqueline Ball is a 83 y.o. female.  HPI Presents for the second time in 3 days, now with concern for anorexia, withdrawn status.  Patient has no formal diagnosis of dementia, but does have family report of increasing confusion over the past few days.  Per EMS, family is also taking care of a dying family member and is unable to care for this individual.  The patient herself is unsure why she is here, denies pain, denies dyspnea, denies nausea.  On chart review the patient is on medications consistent with cognitive impairment, as well as history of A-fib.    Home Medications Prior to Admission medications   Medication Sig Start Date End Date Taking? Authorizing Provider  ACCU-CHEK GUIDE test strip 1 each by Other route See admin instructions. USE AS DIRECTED UP TO THREE TIMES DAILY 01/16/21   [provider]  acetaminophen  (TYLENOL ) 500 MG tablet Take 500 mg by mouth every 6 (six) hours as needed for pain.    [provider]  apixaban  (ELIQUIS ) 5 MG TABS tablet Take 1 tablet (5 mg total) by mouth 2 (two) times daily. 11/24/14   Levern Hutching, MD  atorvastatin  (LIPITOR) 20 MG tablet Take 20 mg by mouth every evening.    [provider]  azelastine  (ASTELIN ) 0.1 % nasal spray Place 1 spray into both nostrils as needed for rhinitis or allergies.  05/24/18   [provider]  Blood Glucose Monitoring Suppl (ACCU-CHEK GUIDE) w/Device KIT 1 each by Other route See admin instructions. USE AS DIRECTED UP TO THREE TIMES DAILY 01/16/21   [provider]  cyclobenzaprine  (FLEXERIL ) 5 MG tablet Take 1-2 tablets (5-10 mg total) by mouth at bedtime. 06/06/20   Wieters, Hallie C, PA-C  diclofenac  Sodium (VOLTAREN ) 1 % GEL Apply 2 g topically 4 (four) times daily.  09/13/23   Beverley Leita LABOR, PA-C  donepezil  (ARICEPT ) 10 MG tablet Take 1 tablet (10 mg total) by mouth at bedtime. 06/06/17   Jerri Pfeiffer, MD  esomeprazole  (NEXIUM ) 40 MG capsule Take 40 mg by mouth daily at 12 noon.    [provider]  fluconazole  (DIFLUCAN ) 150 MG tablet Take 150 mg by mouth once.    [provider]  fluticasone  (FLONASE ) 50 MCG/ACT nasal spray Place 1 spray into both nostrils daily. Patient taking differently: Place 1 spray into both nostrils daily as needed for allergies. 09/15/18   Adah Corning A, NP  gabapentin  (NEURONTIN ) 100 MG capsule Take 100 mg by mouth at bedtime.    [provider]  insulin  glargine (LANTUS ) 100 UNIT/ML injection Inject 0.12 mLs (12 Units total) into the skin every evening. 05/29/23   Krishnan, Gokul, MD  losartan  (COZAAR ) 25 MG tablet Take 25 mg by mouth daily. 05/03/20   [provider]  metFORMIN  (GLUCOPHAGE ) 500 MG tablet Take 1 tablet (500 mg total) by mouth 2 (two) times daily with a meal. 06/29/22   Patt Alm Macho, MD  mupirocin  ointment (BACTROBAN ) 2 % Apply 1 Application topically 2 (two) times daily. To affected area till better 08/13/23   Vonna Sharlet POUR, MD  olopatadine  (PATANOL) 0.1 % ophthalmic solution Place 1 drop into both eyes 2 (two) times daily. 10/17/23   Enedelia Dorna HERO, FNP  ondansetron  (ZOFRAN ) 4  MG tablet Take 1 tablet (4 mg total) by mouth every 6 (six) hours. 06/26/23   Francella Rogue, MD  terbinafine  (LAMISIL  AT) 1 % cream Apply 1 Application topically 2 (two) times daily. 10/07/23   Cardama, Raynell Moder, MD  traMADol  (ULTRAM ) 50 MG tablet Take 1 tablet (50 mg total) by mouth 2 (two) times daily as needed for severe pain. for pain 11/23/21   Keith Sor, PA-C      Allergies    Metoprolol     Review of Systems   Review of Systems  Physical Exam Updated Vital Signs Ht 5' 4 (1.626 m)   Wt 60.8 kg   BMI 23.01 kg/m  Physical Exam Vitals and nursing note reviewed.   Constitutional:      General: She is not in acute distress.    Appearance: She is well-developed.  HENT:     Head: Normocephalic and atraumatic.  Eyes:     Conjunctiva/sclera: Conjunctivae normal.  Cardiovascular:     Rate and Rhythm: Normal rate and regular rhythm.  Pulmonary:     Effort: Pulmonary effort is normal. No respiratory distress.     Breath sounds: Normal breath sounds. No stridor.  Abdominal:     General: There is no distension.  Skin:    General: Skin is warm and dry.  Neurological:     Mental Status: She is alert.     Cranial Nerves: No cranial nerve deficit.     Motor: Atrophy present.  Psychiatric:        Cognition and Memory: Cognition is impaired. Memory is impaired.     ED Results / Procedures / Treatments   Labs (all labs ordered are listed, but only abnormal results are displayed) Labs Reviewed  COMPREHENSIVE METABOLIC PANEL - Abnormal; Notable for the following components:      Result Value   Glucose, Bld 137 (*)    All other components within normal limits  CBC WITH DIFFERENTIAL/PLATELET - Abnormal; Notable for the following components:   RBC 3.71 (*)    Hemoglobin 11.5 (*)    HCT 34.4 (*)    All other components within normal limits  URINALYSIS, W/ REFLEX TO CULTURE (INFECTION SUSPECTED)    EKG None  Radiology No results found.  Procedures Procedures    Medications Ordered in ED Medications  apixaban  (ELIQUIS ) tablet 5 mg (has no administration in time range)  atorvastatin  (LIPITOR) tablet 20 mg (has no administration in time range)  cyclobenzaprine  (FLEXERIL ) tablet 5-10 mg (has no administration in time range)  donepezil  (ARICEPT ) tablet 10 mg (has no administration in time range)  pantoprazole  (PROTONIX ) EC tablet 40 mg (has no administration in time range)  fluconazole  (DIFLUCAN ) tablet 150 mg (has no administration in time range)  gabapentin  (NEURONTIN ) capsule 100 mg (has no administration in time range)  insulin   glargine-yfgn (SEMGLEE ) injection 12 Units (has no administration in time range)  losartan  (COZAAR ) tablet 25 mg (has no administration in time range)  metFORMIN  (GLUCOPHAGE ) tablet 500 mg (has no administration in time range)    ED Course/ Medical Decision Making/ A&P                                 Medical Decision Making Elderly female presents with reported concern for confusion, anorexia.  Patient herself though she cannot provide historical details does answer questions about her current condition seemingly appropriately.  She denies pain, nausea, vomiting, fever.  Concern for  dehydration, infection, electrolyte abnormalities, less likely new intracranial abnormality given her preserved neuroexam beyond confusion.  Patient had labs sent.  Amount and/or Complexity of Data Reviewed Independent Historian: EMS External Data Reviewed: notes. Labs: ordered. Decision-making details documented in ED Course.  Risk Prescription drug management. Decision regarding hospitalization. Diagnosis or treatment significantly limited by social determinants of health.  10:07 PM Patient in no distress, labs unremarkable, consistent with prior.  I attempted to contact family members, who are unavailable for additional discussion on the patient's recent history, today's results or plan.  With generally reassuring labs, physical exam, suspicion for worsening confusion, patient will require additional assistance with our social work acupuncturist. Final Clinical Impression(s) / ED Diagnoses Final diagnoses:  Weakness     Garrick Charleston, MD 12/21/23 2207

## 2023-12-21 NOTE — ED Triage Notes (Signed)
 Patient brought in by EMS due to failure to thrive. Per EMS patient is A&O X 1. Ems states that they were informed patient has not been eating for 3 days. Pt does not have any diagnosis of dementia, but EMS states patient does not know what is going on. Family reports to EMS that patient's husband is dying and family is unable to care for her at this time. Patient was calm and cooperative, but not oriented.

## 2023-12-22 MED ORDER — APIXABAN 2.5 MG PO TABS
2.5000 mg | ORAL_TABLET | Freq: Two times a day (BID) | ORAL | Status: DC
  Administered 2023-12-22: 2.5 mg via ORAL
  Filled 2023-12-22: qty 1

## 2023-12-22 MED ORDER — METFORMIN HCL 500 MG PO TABS: 1000.0000 mg | ORAL_TABLET | Freq: Two times a day (BID) | ORAL | Status: DC

## 2023-12-22 NOTE — Care Management (Signed)
 Patient discharging home with Home health orders in place, [facility] has accepted for [facility]

## 2023-12-22 NOTE — ED Provider Notes (Signed)
 Emergency Medicine Observation Re-evaluation Note  Jacqueline Ball is a 83 y.o. female, seen on rounds today.  Pt initially presented to the ED for complaints of Failure To Thrive Currently, the patient is resting quietly.  Physical Exam  BP 119/80   Pulse 65   Temp 98.1 F (36.7 C) (Oral)   Resp 17   Ht 5' 4 (1.626 m)   Wt 60.8 kg   SpO2 100%   BMI 23.01 kg/m  Physical Exam General: No acute distress Cardiac: Well-perfused Lungs: Nonlabored Psych: Calm  ED Course / MDM  EKG:   I have reviewed the labs performed to date as well as medications administered while in observation.  Recent changes in the last 24 hours include TOC consult placed.  Plan  Current plan is for awaiting TOC recommendations.   Patient does not qualify for SNF at this time.  Social work is asked me to put in for home health and it sounds like her family members coming to pick her up at 1   Jacqueline Ozell BROCKS, MD 12/22/23 1217

## 2023-12-22 NOTE — Evaluation (Signed)
 Physical Therapy Evaluation Patient Details Name: Jacqueline Ball MRN: [id number] DOB: 10-Jul-1941 Today's Date: 12/22/2023  History of Present Illness  83 yo female brought o [facility] on 12/22/23 for second time in 3 days, now with concern for anorexia, withdrawn status, FTT.  PMH: (per previous PTand provider notes--as not in current provider notes): dementia, anxiety, DM, HTN,  A-fib.  Clinical Impression  Pt admitted with above diagnosis.  Pt appears at or very near her baseline both cognitively and physically. She is a poor historian and some info obtained from previous PT notes.   Pt is CGA for safety to amb around ED unit without device - pt with intermittent drifting, occasional near scissoring however no overt LOB or need for greater than CGA to maintain balance. Shuna is supervision with RW, improved gait stability noted with device, no LOB or drifting.   pt is oriented to day, month, self and hospital at time of PT eval; cannot state name of facility, thinks she is in [place]. pt is cooperative, conversant and pleasant. follows one and two step commands.  Recommend HHPT however, again pt is likely near her baseline and I suspect this would be on a very short term basis. Pt likely needs ALF/memory care however SNF is not indicated at this time given pt is grossly at supervision level for mobility; supervision is of course indicated d/t documented dementia. We will follow in acute setting should pt be admitted.   Pt currently with functional limitations due to the deficits listed below (see PT Problem List). Pt will benefit from acute skilled PT to increase their independence and safety with mobility to allow discharge.           If plan is discharge home, recommend the following: Supervision due to cognitive status;A little help with bathing/dressing/bathroom;Help with stairs or ramp for entrance;Assist for transportation   Can travel by private vehicle        Equipment Recommendations None  recommended by PT (chart states pt has RW (previous notes))  Recommendations for Other Services       Functional Status Assessment       Precautions / Restrictions Precautions Precautions: Fall Restrictions Weight Bearing Restrictions Per Provider Order: No      Mobility  Bed Mobility Overal bed mobility: Modified Independent             General bed mobility comments: pt assistst self to sitting without use of rails. pt is able to return to bed knees first/quadraped and turn to supine on the stretcher    Transfers Overall transfer level: Needs assistance Equipment used: None Transfers: Sit to/from Stand Sit to Stand: Supervision           General transfer comment: for safety only, no physical assist    Ambulation/Gait Ambulation/Gait assistance: Supervision, Contact guard assist Gait Distance (Feet): 300 Feet (+50' more) Assistive device: Rolling walker (2 wheels) Gait Pattern/deviations: Step-through pattern, Drifts right/left, Narrow base of support Gait velocity: decr however incr with incr distance;     General Gait Details: CGA for safety to amb around ED unit without device - pt with intermittent drifting, occasional near scissoring however no overt LOB or need for greater than CGA to maintain balance. supervision with RW, improved gait stability with device, no LOB or drifting  Stairs            Wheelchair Mobility     Tilt Bed    Modified Rankin (Stroke Patients Only)       Balance  Overall balance assessment: Needs assistance Sitting-balance support: Feet supported, No upper extremity supported Sitting balance-Leahy Scale: Normal     Standing balance support: During functional activity, No upper extremity supported   Standing balance comment: Fair+, unable to tolerate mod challenges without device             High level balance activites: Direction changes, Turns, Head turns, Backward walking High Level Balance Comments: CGA,  no overt LOB with above             Pertinent Vitals/Pain Pain Assessment Pain Assessment: No/denies pain    Home Living Family/patient expects to be discharged to:: Private residence     Type of Home: House Home Access: Stairs to enter   Secretary/administrator of Steps: 5   Home Layout: Multi-level;Bed/bath upstairs Home Equipment: Pharmacist, Hospital (2 wheels);Cane - single point Additional Comments: Pt questionable historian (obtained from chart review 6 months ago)    Prior Function Prior Level of Function : Patient poor historian/Family not available             Mobility Comments: Ambulates without AD; denies falls; states I do for myself ADLs Comments: family assist with IADLs; Pt reports independent ADLs     Extremity/Trunk Assessment   Upper Extremity Assessment Upper Extremity Assessment: Generalized weakness (grossly 3+/5, AROM WFL)    Lower Extremity Assessment Lower Extremity Assessment: Overall WFL for tasks assessed (grossly 4/5, AROM WFL bil)       Communication   Communication Communication: No apparent difficulties  Cognition Arousal: Alert Behavior During Therapy: WFL for tasks assessed/performed Overall Cognitive Status: History of cognitive impairments - at baseline                                 General Comments: pt with documented dementia; pt is oriented to day, month, self and hospital; cannot state name of facility, thinks she is in [place]. pt is cooperative, conversant and pleasant. follows one and two step commands.        General Comments      Exercises     Assessment/Plan    PT Assessment Patient needs continued PT services  PT Problem List Decreased strength;Decreased safety awareness;Decreased cognition;Decreased knowledge of use of DME       PT Treatment Interventions DME instruction;Balance training;Gait training;Functional mobility training;Therapeutic activities;Therapeutic  exercise;Patient/family education    PT Goals (Current goals can be found in the Care Plan section)  Acute Rehab PT Goals PT Goal Formulation: Patient unable to participate in goal setting Time For Goal Achievement: 01/05/24 Potential to Achieve Goals: Good    Frequency Min 1X/week     Co-evaluation               AM-PAC PT 6 Clicks Mobility  Outcome Measure Help needed turning from your back to your side while in a flat bed without using bedrails?: None Help needed moving from lying on your back to sitting on the side of a flat bed without using bedrails?: None Help needed moving to and from a bed to a chair (including a wheelchair)?: None Help needed standing up from a chair using your arms (e.g., wheelchair or bedside chair)?: None Help needed to walk in hospital room?: A Little Help needed climbing 3-5 steps with a railing? : A Little 6 Click Score: 22    End of Session Equipment Utilized During Treatment: Gait belt Activity Tolerance: Patient tolerated treatment well Patient  left: in bed;with call bell/phone within reach   PT Visit Diagnosis: Other abnormalities of gait and mobility (R26.89);Muscle weakness (generalized) (M62.81)    Time: 8974-8958 PT Time Calculation (min) (ACUTE ONLY): 16 min   Charges:   PT Evaluation $PT Eval Low Complexity: 1 Low   PT General Charges $$ ACUTE PT VISIT: 1 Visit         Adrena Nakamura, PT  Acute Rehab Dept [facility]) [phone]  12/22/2023   [facility] 12/22/2023, 11:01 AM

## 2023-12-22 NOTE — Discharge Instructions (Addendum)
 The resource list below includes Private Duty care agencies who may be able to assist in the home.  You can also contact Garnette Rubenstein at [phone], representative with [facility] to inquire about Home Care options.   Private Pay Resources  [facility] Address: [address] [place], [place] [address] Phone: [phone]  [facility] Address: [address], [place], [place] [address] Phone: [phone]  [facility] Address: [address] [place], [place] [address] Phone: [phone]  [facility] Address: [address] [place], [place] [address] Phone: [phone]  [facility] Address: [address] [place], [place], [place] [address] Phone: [phone]  [facility] Phone: [phone]  [facility] Address:  [address], [place], [place] [address] Phone:  [phone]  [facility] Address:  [address] Phone:  [phone]  [facility] Phone: [phone]

## 2023-12-22 NOTE — Progress Notes (Signed)
 CSW noted PT's rec for [facility]. CSW has informed RNCM via secure chat who will assist with securing HH services if family is interested. Pt does not qualify for SNF. Additionally, CSW has attached private duty care resources to AVS.   CSW attempted to contact pt's daughter/son ([name] & Aliene) without success. Left HIPAA Compliant voicemail. CSW also attempted to contact pt's granddaughter listed in chart without success. Phone appears to be disconnected.

## 2023-12-22 NOTE — Progress Notes (Signed)
Awaiting PT.  

## 2023-12-22 NOTE — Progress Notes (Signed)
 Transition of Care [facility]) - Emergency Department Mini Assessment   Patient Details  Name: Jacqueline Ball MRN: [id number] Date of Birth: 20-Nov-1941  [facility]) CM/SW Contact:    Kari JONETTA Daisy, LCSW Phone Number: 12/22/2023, 11:51 AM   Clinical Narrative: CSW received call back from pt's son, Aliene. CSW informed that PT rec'd HH. Aliene is interested and states they have had [facility] in the past. Aliene prefers [facility] and any agency who is in network with insurance if they are unable to service. CSW encouraged Aliene to schedule a PCP appt and inquire about a referral to neurology due to concerns of pt's cognitive decline. Aliene states he contacted the pt's PCP office on Friday and expects a call back tomorrow.   CSW discussed resources attached to AVS and informed Aliene that pt would need a formal dementia dx if family wishes to transition her into a memory care facility at any point. Edwin verbalized understanding. Aliene will provide transportation and reports he should arrive around 1pm. This CSW notified RNCM, EDP, and RN's via secure chat.    ED Mini Assessment: What brought you to the Emergency Department? : family reported failure to thrive  Barriers to Discharge: No Barriers Identified     Means of departure: Car       Patient Contact and Communications Key Contact 1: Labella Zahradnik (son) - [phone]   Spoke with: Melaysia Streed (son) - [phone] Contact Date: 12/22/23,   Contact time: 1140 Contact Phone Number: Kyung Muto (son) - [phone] Call outcome: Interested in [facility] services  Patient states their goals for this hospitalization and ongoing recovery are:: Have pt see PCP and hopefully referral to neurology CMS Medicare.gov Compare Post Acute Care list provided to:: Patient Represenative (must comment) (Adult son) Choice offered to / list presented to : Adult Children  Admission diagnosis:  Failure to thrive Patient Active Problem List   Diagnosis  Date Noted   Dizziness and giddiness 06/16/2023   Hypomagnesemia 05/29/2023   UTI (urinary tract infection) 05/29/2023   Malnutrition of moderate degree 05/29/2023   Hypoglycemia 05/28/2023   Shortness of breath 05/28/2023   Acute midline low back pain without sciatica 11/29/2019   GERD (gastroesophageal reflux disease) 05/12/2019   Arthritis 05/12/2019   Dementia (HCC) 05/12/2019   Hyperlipidemia 05/12/2019   Overactive bladder 05/12/2019   Chronic anticoagulation 03/18/2018   Late onset Alzheimer's disease without behavioral disturbance (HCC) 01/21/2017   Chronic atrial fibrillation (HCC) 11/02/2015   Essential hypertension 11/02/2015   Type 2 diabetes mellitus without complication, without long-term current use of insulin  (HCC) 11/02/2015   Cognitive impairment 10/31/2015   Chest pain 11/22/2014   Muscle cramps 04/17/2012   Bilateral shoulder pain 03/09/2012   Essential hypertension 03/28/2011   Diabetes mellitus type 2, noninsulin dependent (HCC) 03/28/2011   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 [place] [address] Phone: [phone] Fax: [phone]  [facility] [address] [place]), [place] - [address] [place]) [place] [address] Phone: [phone] Fax: [phone]

## 2023-12-27 DIAGNOSIS — D6869 Other thrombophilia: Secondary | ICD-10-CM | POA: Diagnosis not present

## 2023-12-27 DIAGNOSIS — G301 Alzheimer's disease with late onset: Secondary | ICD-10-CM | POA: Diagnosis not present

## 2023-12-27 DIAGNOSIS — F0280 Dementia in other diseases classified elsewhere without behavioral disturbance: Secondary | ICD-10-CM | POA: Diagnosis not present

## 2023-12-27 DIAGNOSIS — I482 Chronic atrial fibrillation, unspecified: Secondary | ICD-10-CM | POA: Diagnosis not present

## 2023-12-27 DIAGNOSIS — Z79899 Other long term (current) drug therapy: Secondary | ICD-10-CM | POA: Diagnosis not present

## 2023-12-27 DIAGNOSIS — E1151 Type 2 diabetes mellitus with diabetic peripheral angiopathy without gangrene: Secondary | ICD-10-CM | POA: Diagnosis not present

## 2023-12-27 DIAGNOSIS — F332 Major depressive disorder, recurrent severe without psychotic features: Secondary | ICD-10-CM | POA: Diagnosis not present

## 2024-01-13 ENCOUNTER — Other Ambulatory Visit: Payer: Self-pay

## 2024-01-13 ENCOUNTER — Observation Stay (HOSPITAL_BASED_OUTPATIENT_CLINIC_OR_DEPARTMENT_OTHER)
Admission: EM | Admit: 2024-01-13 | Discharge: 2024-01-15 | Disposition: A | Discharge status: Home / Self Care | Payer: Medicare HMO | Attending: Family Medicine | Admitting: Family Medicine

## 2024-01-13 ENCOUNTER — Encounter (HOSPITAL_BASED_OUTPATIENT_CLINIC_OR_DEPARTMENT_OTHER): Payer: Self-pay | Admitting: Emergency Medicine

## 2024-01-13 DIAGNOSIS — Z7901 Long term (current) use of anticoagulants: Secondary | ICD-10-CM | POA: Insufficient documentation

## 2024-01-13 DIAGNOSIS — F0280 Dementia in other diseases classified elsewhere without behavioral disturbance: Secondary | ICD-10-CM | POA: Diagnosis present

## 2024-01-13 DIAGNOSIS — Z794 Long term (current) use of insulin: Secondary | ICD-10-CM | POA: Diagnosis not present

## 2024-01-13 DIAGNOSIS — R112 Nausea with vomiting, unspecified: Secondary | ICD-10-CM | POA: Diagnosis not present

## 2024-01-13 DIAGNOSIS — Z7409 Other reduced mobility: Secondary | ICD-10-CM | POA: Insufficient documentation

## 2024-01-13 DIAGNOSIS — R262 Difficulty in walking, not elsewhere classified: Secondary | ICD-10-CM | POA: Diagnosis not present

## 2024-01-13 DIAGNOSIS — Z7984 Long term (current) use of oral hypoglycemic drugs: Secondary | ICD-10-CM | POA: Insufficient documentation

## 2024-01-13 DIAGNOSIS — Z79899 Other long term (current) drug therapy: Secondary | ICD-10-CM | POA: Insufficient documentation

## 2024-01-13 DIAGNOSIS — I482 Chronic atrial fibrillation, unspecified: Secondary | ICD-10-CM | POA: Diagnosis not present

## 2024-01-13 DIAGNOSIS — Z1152 Encounter for screening for COVID-19: Secondary | ICD-10-CM | POA: Diagnosis not present

## 2024-01-13 DIAGNOSIS — I10 Essential (primary) hypertension: Secondary | ICD-10-CM | POA: Diagnosis present

## 2024-01-13 DIAGNOSIS — E119 Type 2 diabetes mellitus without complications: Secondary | ICD-10-CM | POA: Diagnosis not present

## 2024-01-13 DIAGNOSIS — E860 Dehydration: Secondary | ICD-10-CM | POA: Insufficient documentation

## 2024-01-13 DIAGNOSIS — R531 Weakness: Secondary | ICD-10-CM | POA: Diagnosis not present

## 2024-01-13 DIAGNOSIS — R197 Diarrhea, unspecified: Principal | ICD-10-CM | POA: Diagnosis present

## 2024-01-13 DIAGNOSIS — R410 Disorientation, unspecified: Secondary | ICD-10-CM | POA: Diagnosis not present

## 2024-01-13 DIAGNOSIS — G301 Alzheimer's disease with late onset: Secondary | ICD-10-CM

## 2024-01-13 DIAGNOSIS — E876 Hypokalemia: Secondary | ICD-10-CM | POA: Diagnosis present

## 2024-01-13 DIAGNOSIS — Z743 Need for continuous supervision: Secondary | ICD-10-CM | POA: Diagnosis not present

## 2024-01-13 LAB — COMPREHENSIVE METABOLIC PANEL
ALT: 20 U/L (ref 0–44)
AST: 19 U/L (ref 15–41)
Albumin: 3.9 g/dL (ref 3.5–5.0)
Alkaline Phosphatase: 75 U/L (ref 38–126)
Anion gap: 11 (ref 5–15)
BUN: 14 mg/dL (ref 8–23)
CO2: 22 mmol/L (ref 22–32)
Calcium: 9.2 mg/dL (ref 8.9–10.3)
Chloride: 105 mmol/L (ref 98–111)
Creatinine, Ser: 0.63 mg/dL (ref 0.44–1.00)
GFR, Estimated: >60 mL/min (ref >60)
Glucose, Bld: 123 mg/dL — ABNORMAL HIGH (ref 70–99)
Potassium: 2.9 mmol/L — ABNORMAL LOW (ref 3.5–5.1)
Sodium: 138 mmol/L (ref 135–145)
Total Bilirubin: 0.9 mg/dL (ref 0.0–1.2)
Total Protein: 7.2 g/dL (ref 6.5–8.1)

## 2024-01-13 LAB — URINALYSIS, MICROSCOPIC (REFLEX): RBC / HPF: NONE SEEN RBC/hpf (ref 0–5)

## 2024-01-13 LAB — RESP PANEL BY RT-PCR (RSV, FLU A&B, COVID)  RVPGX2
Influenza A by PCR: NEGATIVE
Influenza B by PCR: NEGATIVE
Resp Syncytial Virus by PCR: NEGATIVE
SARS Coronavirus 2 by RT PCR: NEGATIVE

## 2024-01-13 LAB — CBC WITH DIFFERENTIAL/PLATELET
Abs Immature Granulocytes: 0.03 10*3/uL (ref 0.00–0.07)
Basophils Absolute: 0 10*3/uL (ref 0.0–0.1)
Basophils Relative: 0 %
Eosinophils Absolute: 0 10*3/uL (ref 0.0–0.5)
Eosinophils Relative: 0 %
HCT: 33.4 % — ABNORMAL LOW (ref 36.0–46.0)
Hemoglobin: 11.2 g/dL — ABNORMAL LOW (ref 12.0–15.0)
Immature Granulocytes: 0 %
Lymphocytes Relative: 14 %
Lymphs Abs: 1.3 10*3/uL (ref 0.7–4.0)
MCH: 31.5 pg (ref 26.0–34.0)
MCHC: 33.5 g/dL (ref 30.0–36.0)
MCV: 94.1 fL (ref 80.0–100.0)
Monocytes Absolute: 0.8 10*3/uL (ref 0.1–1.0)
Monocytes Relative: 9 %
Neutro Abs: 6.9 10*3/uL (ref 1.7–7.7)
Neutrophils Relative %: 77 %
Platelets: 388 10*3/uL (ref 150–400)
RBC: 3.55 MIL/uL — ABNORMAL LOW (ref 3.87–5.11)
RDW: 14.6 % (ref 11.5–15.5)
WBC: 9 10*3/uL (ref 4.0–10.5)
nRBC: 0 % (ref 0.0–0.2)

## 2024-01-13 LAB — URINALYSIS, ROUTINE W REFLEX MICROSCOPIC
Bilirubin Urine: NEGATIVE
Glucose, UA: NEGATIVE mg/dL
Hgb urine dipstick: NEGATIVE
Ketones, ur: 15 mg/dL — AB
Nitrite: NEGATIVE
Protein, ur: NEGATIVE mg/dL
Specific Gravity, Urine: 1.02 (ref 1.005–1.030)
pH: 7 (ref 5.0–8.0)

## 2024-01-13 LAB — GLUCOSE, CAPILLARY: Glucose-Capillary: 97 mg/dL (ref 70–99)

## 2024-01-13 LAB — MAGNESIUM: Magnesium: 1.7 mg/dL (ref 1.7–2.4)

## 2024-01-13 LAB — LIPASE, BLOOD: Lipase: 26 U/L (ref 11–51)

## 2024-01-13 LAB — C DIFFICILE QUICK SCREEN W PCR REFLEX
C Diff antigen: NEGATIVE
C Diff interpretation: NOT DETECTED
C Diff toxin: NEGATIVE

## 2024-01-13 MED ORDER — PANTOPRAZOLE SODIUM 40 MG PO TBEC
40.0000 mg | DELAYED_RELEASE_TABLET | Freq: Every day | ORAL | Status: DC
  Administered 2024-01-14 – 2024-01-15 (×2): 40 mg via ORAL
  Filled 2024-01-13 (×2): qty 1

## 2024-01-13 MED ORDER — ACETAMINOPHEN 325 MG PO TABS
650.0000 mg | ORAL_TABLET | Freq: Four times a day (QID) | ORAL | Status: DC | PRN
  Administered 2024-01-14 – 2024-01-15 (×3): 650 mg via ORAL
  Filled 2024-01-13 (×3): qty 2

## 2024-01-13 MED ORDER — MAGNESIUM SULFATE IN D5W 1-5 GM/100ML-% IV SOLN
1.0000 g | Freq: Once | INTRAVENOUS | Status: AC
  Administered 2024-01-13: 1 g via INTRAVENOUS
  Filled 2024-01-13: qty 100

## 2024-01-13 MED ORDER — SODIUM CHLORIDE 0.9 % IV BOLUS
500.0000 mL | Freq: Once | INTRAVENOUS | Status: AC
Start: 2024-01-13 — End: 2024-01-13
  Administered 2024-01-13: 500 mL via INTRAVENOUS

## 2024-01-13 MED ORDER — APIXABAN 2.5 MG PO TABS
2.5000 mg | ORAL_TABLET | Freq: Two times a day (BID) | ORAL | Status: DC
  Administered 2024-01-13 – 2024-01-15 (×4): 2.5 mg via ORAL
  Filled 2024-01-13 (×4): qty 1

## 2024-01-13 MED ORDER — PROCHLORPERAZINE EDISYLATE 10 MG/2ML IJ SOLN: 5.0000 mg | Freq: Four times a day (QID) | INTRAMUSCULAR | Status: DC | PRN

## 2024-01-13 MED ORDER — INSULIN ASPART 100 UNIT/ML IJ SOLN
0.0000 [IU] | Freq: Three times a day (TID) | INTRAMUSCULAR | Status: DC
  Administered 2024-01-14: 1 [IU] via SUBCUTANEOUS

## 2024-01-13 MED ORDER — POTASSIUM CHLORIDE 20 MEQ PO PACK
20.0000 meq | PACK | Freq: Once | ORAL | Status: AC
  Administered 2024-01-13: 20 meq via ORAL
  Filled 2024-01-13: qty 1

## 2024-01-13 MED ORDER — POTASSIUM CHLORIDE 10 MEQ/100ML IV SOLN
10.0000 meq | INTRAVENOUS | Status: AC
  Administered 2024-01-13 (×4): 10 meq via INTRAVENOUS
  Filled 2024-01-13 (×4): qty 100

## 2024-01-13 MED ORDER — INSULIN ASPART 100 UNIT/ML IJ SOLN: 0.0000 [IU] | Freq: Every day | INTRAMUSCULAR | Status: DC

## 2024-01-13 MED ORDER — SODIUM CHLORIDE 0.9% FLUSH
3.0000 mL | Freq: Two times a day (BID) | INTRAVENOUS | Status: DC
  Administered 2024-01-13 – 2024-01-15 (×4): 3 mL via INTRAVENOUS

## 2024-01-13 MED ORDER — ATORVASTATIN CALCIUM 20 MG PO TABS
20.0000 mg | ORAL_TABLET | Freq: Every day | ORAL | Status: DC
  Administered 2024-01-14 – 2024-01-15 (×2): 20 mg via ORAL
  Filled 2024-01-13 (×2): qty 1

## 2024-01-13 MED ORDER — SODIUM CHLORIDE 0.9 % IV SOLN: Freq: Once | INTRAVENOUS | Status: AC

## 2024-01-13 MED ORDER — ACETAMINOPHEN 650 MG RE SUPP: 650.0000 mg | Freq: Four times a day (QID) | RECTAL | Status: DC | PRN

## 2024-01-13 NOTE — ED Notes (Signed)
[facility] called for transport, No Current ETA ED Nurse will call floor to give report Called @ 19:39

## 2024-01-13 NOTE — ED Notes (Signed)
Spoke with son Dorma Russell. He states that he is on the way to facility. States that pt has some memory loss and called EMS today for pt because he noted that she had stool everywhere and that this is not like her.

## 2024-01-13 NOTE — ED Triage Notes (Signed)
BIB by [facility] , from home , co NVD x 3 days , Hx dementia . Alert and oriented to self .  Pt presents with incontinent diarrhea .

## 2024-01-13 NOTE — ED Notes (Signed)
Report given to Renita, Charity fundraiser.

## 2024-01-13 NOTE — H&P (Signed)
History and Physical    Jacqueline Ball ZOX:[id number] DOB: January 24, 1941 DOA: 01/13/2024  PCP: Sharmon Revere, MD   Patient coming from: Home   Chief Complaint: Diarrhea, generalized weakness   HPI: Jacqueline Ball is a 83 y.o. female with medical history significant for atrial fibrillation on Eliquis, type 2 diabetes mellitus, hyperlipidemia, and memory loss who presents for evaluation of diarrhea and generalized weakness.  Patient is reported to have had nausea, vomiting, and diarrhea at home for the past 3 days.  Over this interval, the patient has become generally weak and is now unable to get up on her own or perform her ADLs.  Today, her son found her soiled and liquid stool and brought her into the ED.  Patient denies any abdominal pain, fevers, or nausea.  She complains of irritation around her anus from the diarrhea.  She does not remember vomiting, is hungry, and wants to try eating.  [facility] ED Course: Upon arrival to the ED, patient is found to be afebrile and saturating well on room air with normal heart rate and elevated blood pressure.  Labs are most notable for potassium 2.9, normal renal function, normal WBC, negative respiratory virus panel, and negative C. difficile testing.  Patient was given 500 mL of normal saline and 40 mill equivalents IV potassium.  She was transferred to [facility] for admission.  Review of Systems:  All other systems reviewed and apart from HPI, are negative.  Past Medical History:  Diagnosis Date   Anxiety    Atrial fibrillation (HCC)    Diabetes mellitus    Hyperlipidemia    Hypertension    Memory loss    Mood disorder [facility])    Physical exam, annual 10/22/06   Renal cyst     Past Surgical History:  Procedure Laterality Date   ABDOMINAL HYSTERECTOMY      Social History:   reports that she has never smoked. She has never used smokeless tobacco. She reports that she does not drink alcohol and does not use drugs.  Allergies   Allergen Reactions   Metoprolol Nausea And Vomiting    Family History  Problem Relation Age of Onset   Diabetes Sister      Prior to Admission medications   Medication Sig Start Date End Date Taking? Authorizing Provider  acetaminophen (TYLENOL) 500 MG tablet Take 1,000 mg by mouth every 6 (six) hours as needed for pain.    [provider]  apixaban (ELIQUIS) 2.5 MG TABS tablet Take 2.5 mg by mouth 2 (two) times daily.    [provider]  atorvastatin (LIPITOR) 20 MG tablet Take 20 mg by mouth daily.    [provider]  diclofenac Sodium (VOLTAREN) 1 % GEL Apply 2 g topically 4 (four) times daily. Patient taking differently: Apply 1 Application topically as needed (pain). 09/13/23   Jeannie Fend, PA-C  esomeprazole (NEXIUM) 40 MG capsule Take 40 mg by mouth daily at 12 noon. Patient not taking: Reported on 12/22/2023    [provider]  insulin glargine (LANTUS) 100 UNIT/ML injection Inject 0.12 mLs (12 Units total) into the skin every evening. Patient not taking: Reported on 12/22/2023 05/29/23   Osvaldo Shipper, MD  metFORMIN (GLUCOPHAGE) 1000 MG tablet Take 1,000 mg by mouth 2 (two) times daily with a meal.    [provider]  mupirocin ointment (BACTROBAN) 2 % Apply 1 Application topically 2 (two) times daily. To affected area till better Patient not taking: Reported on 12/22/2023 08/13/23  Zenia Resides, MD  terbinafine (LAMISIL AT) 1 % cream Apply 1 Application topically 2 (two) times daily. Patient not taking: Reported on 12/22/2023 10/07/23   Nira Conn, MD    Physical Exam: Vitals:   01/13/24 1430 01/13/24 1745 01/13/24 1815 01/13/24 2120  BP: (!) 155/89 (!) 141/85 (!) 149/85 (!) 153/97  Pulse: 78 85 83 79  Resp: 19 17 18 14   Temp:  98.4 F (36.9 C)  98.6 F (37 C)  TempSrc:  Oral  Oral  SpO2: 100% 98% 98% 100%  Weight:    51.9 kg    Constitutional: NAD, calm  Eyes: PERTLA, lids and conjunctivae normal ENMT:  Mucous membranes are dry. Posterior pharynx clear of any exudate or lesions.   Neck: supple, no masses  Respiratory: no wheezing, no crackles. No accessory muscle use.  Cardiovascular: S1 & S2 heard, regular rate and rhythm. No extremity edema.  Abdomen: No distension, no tenderness, soft. Bowel sounds active.  Musculoskeletal: no clubbing / cyanosis. No joint deformity upper and lower extremities.   Skin: no significant rashes, lesions, ulcers. Warm, dry, well-perfused. Neurologic: CN 2-12 grossly intact. Sensation to light touch intact. Moving all extremities. Alert and oriented to person and place.  Psychiatric: Pleasant. Cooperative.    Labs and Imaging on Admission: I have personally reviewed following labs and imaging studies  CBC: Recent Labs  Lab 01/13/24 1353  WBC 9.0  NEUTROABS 6.9  HGB 11.2*  HCT 33.4*  MCV 94.1  PLT 388   Basic Metabolic Panel: Recent Labs  Lab 01/13/24 1353 01/13/24 1451  NA 138  --   K 2.9*  --   CL 105  --   CO2 22  --   GLUCOSE 123*  --   BUN 14  --   CREATININE 0.63  --   CALCIUM 9.2  --   MG  --  1.7   GFR: Estimated Creatinine Clearance: 45.2 mL/min (by C-G formula based on SCr of 0.63 mg/dL). Liver Function Tests: Recent Labs  Lab 01/13/24 1353  AST 19  ALT 20  ALKPHOS 75  BILITOT 0.9  PROT 7.2  ALBUMIN 3.9   Recent Labs  Lab 01/13/24 1353  LIPASE 26   No results for input(s): "AMMONIA" in the last 168 hours. Coagulation Profile: No results for input(s): "INR", "PROTIME" in the last 168 hours. Cardiac Enzymes: No results for input(s): "CKTOTAL", "CKMB", "CKMBINDEX", "TROPONINI" in the last 168 hours. BNP (last 3 results) No results for input(s): "PROBNP" in the last 8760 hours. HbA1C: No results for input(s): "HGBA1C" in the last 72 hours. CBG: Recent Labs  Lab 01/13/24 2208  GLUCAP 97   Lipid Profile: No results for input(s): "CHOL", "HDL", "LDLCALC", "TRIG", "CHOLHDL", "LDLDIRECT" in the last 72  hours. Thyroid Function Tests: No results for input(s): "TSH", "T4TOTAL", "FREET4", "T3FREE", "THYROIDAB" in the last 72 hours. Anemia Panel: No results for input(s): "VITAMINB12", "FOLATE", "FERRITIN", "TIBC", "IRON", "RETICCTPCT" in the last 72 hours. Urine analysis:    Component Value Date/Time   COLORURINE YELLOW 01/13/2024 1325   APPEARANCEUR HAZY (A) 01/13/2024 1325   LABSPEC 1.020 01/13/2024 1325   PHURINE 7.0 01/13/2024 1325   GLUCOSEU NEGATIVE 01/13/2024 1325   HGBUR NEGATIVE 01/13/2024 1325   BILIRUBINUR NEGATIVE 01/13/2024 1325   BILIRUBINUR negative 07/16/2023 1939   KETONESUR 15 (A) 01/13/2024 1325   PROTEINUR NEGATIVE 01/13/2024 1325   UROBILINOGEN 0.2 07/16/2023 1939   UROBILINOGEN 0.2 04/02/2020 1041   NITRITE NEGATIVE 01/13/2024 1325   LEUKOCYTESUR  SMALL (A) 01/13/2024 1325   Sepsis Labs: @LABRCNTIP (procalcitonin:4,lacticidven:4) ) Recent Results (from the past 240 hours)  Resp panel by RT-PCR (RSV, Flu A&B, Covid) Anterior Nasal Swab     Status: None   Collection Time: 01/13/24  1:53 PM   Specimen: Anterior Nasal Swab  Result Value Ref Range Status   SARS Coronavirus 2 by RT PCR NEGATIVE NEGATIVE Final    Comment: (NOTE) SARS-CoV-2 target nucleic acids are NOT DETECTED.  The SARS-CoV-2 RNA is generally detectable in upper respiratory specimens during the acute phase of infection. The lowest concentration of SARS-CoV-2 viral copies this assay can detect is 138 copies/mL. A negative result does not preclude SARS-Cov-2 infection and should not be used as the sole basis for treatment or other patient management decisions. A negative result may occur with  improper specimen collection/handling, submission of specimen other than nasopharyngeal swab, presence of viral mutation(s) within the areas targeted by this assay, and inadequate number of viral copies(<138 copies/mL). A negative result must be combined with clinical observations, patient history, and  epidemiological information. The expected result is Negative.  Fact Sheet for Patients:  BloggerCourse.com  Fact Sheet for Healthcare Providers:  SeriousBroker.it  This test is no t yet approved or cleared by the [place] FDA and  has been authorized for detection and/or diagnosis of SARS-CoV-2 by FDA under an Emergency Use Authorization (EUA). This EUA will remain  in effect (meaning this test can be used) for the duration of the COVID-19 declaration under Section 564(b)(1) of the Act, 21 U.S.C.section 360bbb-3(b)(1), unless the authorization is terminated  or revoked sooner.       Influenza A by PCR NEGATIVE NEGATIVE Final   Influenza B by PCR NEGATIVE NEGATIVE Final    Comment: (NOTE) The Xpert Xpress SARS-CoV-2/FLU/RSV plus assay is intended as an aid in the diagnosis of influenza from Nasopharyngeal swab specimens and should not be used as a sole basis for treatment. Nasal washings and aspirates are unacceptable for Xpert Xpress SARS-CoV-2/FLU/RSV testing.  Fact Sheet for Patients: BloggerCourse.com  Fact Sheet for Healthcare Providers: SeriousBroker.it  This test is not yet approved or cleared by the [place] FDA and has been authorized for detection and/or diagnosis of SARS-CoV-2 by FDA under an Emergency Use Authorization (EUA). This EUA will remain in effect (meaning this test can be used) for the duration of the COVID-19 declaration under Section 564(b)(1) of the Act, 21 U.S.C. section 360bbb-3(b)(1), unless the authorization is terminated or revoked.     Resp Syncytial Virus by PCR NEGATIVE NEGATIVE Final    Comment: (NOTE) Fact Sheet for Patients: BloggerCourse.com  Fact Sheet for Healthcare Providers: SeriousBroker.it  This test is not yet approved or cleared by the [place] FDA and has been  authorized for detection and/or diagnosis of SARS-CoV-2 by FDA under an Emergency Use Authorization (EUA). This EUA will remain in effect (meaning this test can be used) for the duration of the COVID-19 declaration under Section 564(b)(1) of the Act, 21 U.S.C. section 360bbb-3(b)(1), unless the authorization is terminated or revoked.  Performed at [facility], [address] [place], [place] [address]   C Difficile Quick Screen w PCR reflex     Status: None   Collection Time: 01/13/24  1:53 PM   Specimen: Urine, Clean Catch; Stool  Result Value Ref Range Status   C Diff antigen NEGATIVE NEGATIVE Final   C Diff toxin NEGATIVE NEGATIVE Final   C Diff interpretation No C. difficile detected.  Final    Comment: Performed at [facility], [address] [place], [place] [address]     Radiological Exams on Admission: No results found.  Assessment/Plan   1. Diarrhea with dehydration and hypokalemia  - Treated with IVF and IV potassium in ED  - C diff testing is negative; GI pathogen panel is pending  - Continue enteric precautions, follow-up on GI pathogen panel, give additional potassium orally, encourage oral hydration as tolerated, repeat labs in am    2. Atrial fibrillation  - Eliquis    3. Type II DM  - A1c was 6.4% in June 2024  - Check CBGs and use low-intensity SSI for now    4. Dementia  - Delirium precautions    5. Generalized weakness  - No focal deficits identified, anticipate improvement with rehydration and correction of hypokalemia    DVT prophylaxis: Eliquis  Code Status: Full  Level of Care: Level of care: Telemetry Family Communication: Son updated from ED  Disposition Plan:  Patient is from: Home  Anticipated d/c is to: Home  Anticipated d/c date is: 1/28 or 01/15/24  Patient currently: Pending correction of electrolytes, stool studies  Consults called: None Admission status: Observation     Briscoe Deutscher, MD [facility]  01/13/2024, 10:24 PM

## 2024-01-13 NOTE — ED Provider Notes (Signed)
Emergency Department Provider Note   I have reviewed the triage vital signs and the nursing notes.   HISTORY  Chief Complaint Diarrhea   HPI Jacqueline Ball is a 83 y.o. female past history reviewed below including diabetes, hypertension, hyperlipidemia, dementia presents to the emergency department with diarrhea and weakness.  She arrives by [facility] from home.  She has had nausea, vomiting, diarrhea for the past 3 days.  She has had generalized weakness to the point of being unable to get up and perform ADLs. She denies active pain. No known abx use.   Level 5 caveat: dementia   Past Medical History:  Diagnosis Date   Anxiety    Atrial fibrillation (HCC)    Diabetes mellitus    Hyperlipidemia    Hypertension    Memory loss    Mood disorder (HCC)    Physical exam, annual 10/22/06   Renal cyst     Review of Systems  Level 5 caveat: Dementia   ____________________________________________   PHYSICAL EXAM:  VITAL SIGNS: ED Triage Vitals  Encounter Vitals Group     BP 01/13/24 1320 (!) 152/85     Resp 01/13/24 1320 18     Temp 01/13/24 1300 97.9 F (36.6 C)     Temp src --      SpO2 01/13/24 1320 100 %   Constitutional: Alert and conversational but confused. Well appearing and in no acute distress. Eyes: Conjunctivae are normal.  Head: Atraumatic. Nose: No congestion/rhinnorhea. Mouth/Throat: Mucous membranes are moist.  Neck: No stridor.   Cardiovascular: Normal rate, regular rhythm. Good peripheral circulation. Grossly normal heart sounds.   Respiratory: Normal respiratory effort.  No retractions. Lungs CTAB. Gastrointestinal: Soft and nontender. No distention.  Musculoskeletal: No gross deformities of extremities. Neurologic:  Normal speech and language.  Skin:  Skin is warm, dry and intact. No rash noted.  ____________________________________________   LABS (all labs ordered are listed, but only abnormal results are displayed)  Labs  Reviewed  COMPREHENSIVE METABOLIC PANEL - Abnormal; Notable for the following components:      Result Value   Potassium 2.9 (*)    Glucose, Bld 123 (*)    All other components within normal limits  CBC WITH DIFFERENTIAL/PLATELET - Abnormal; Notable for the following components:   RBC 3.55 (*)    Hemoglobin 11.2 (*)    HCT 33.4 (*)    All other components within normal limits  RESP PANEL BY RT-PCR (RSV, FLU A&B, COVID)  [id number]  C DIFFICILE QUICK SCREEN W PCR REFLEX    GASTROINTESTINAL PANEL BY PCR, STOOL (REPLACES STOOL CULTURE)  LIPASE, BLOOD  URINALYSIS, ROUTINE W REFLEX MICROSCOPIC  MAGNESIUM   ____________________________________________   PROCEDURES  Procedure(s) performed:   Procedures  CRITICAL CARE Performed by: Maia Plan Total critical care time: 35 minutes Critical care time was exclusive of separately billable procedures and treating other patients. Critical care was necessary to treat or prevent imminent or life-threatening deterioration. Critical care was time spent personally by me on the following activities: development of treatment plan with patient and/or surrogate as well as nursing, discussions with consultants, evaluation of patient's response to treatment, examination of patient, obtaining history from patient or surrogate, ordering and performing treatments and interventions, ordering and review of laboratory studies, ordering and review of radiographic studies, pulse oximetry and re-evaluation of patient's condition.  Alona Bene, MD Emergency Medicine  ____________________________________________   INITIAL IMPRESSION / ASSESSMENT AND PLAN / ED COURSE  Pertinent labs &  imaging results that were available during my care of the patient were reviewed by me and considered in my medical decision making (see chart for details).   This patient is Presenting for Evaluation of diarrhea, which does require a range of treatment options, and is a complaint  that involves a high risk of morbidity and mortality.  The Differential Diagnoses includes but is not exclusive to acute cholecystitis, intrathoracic causes for epigastric abdominal pain, gastritis, duodenitis, pancreatitis, small bowel or large bowel obstruction, abdominal aortic aneurysm, hernia, gastritis, etc.   Critical Interventions-    Medications  potassium chloride 10 mEq in 100 mL IVPB (has no administration in time range)  0.9 %  sodium chloride infusion (has no administration in time range)  sodium chloride 0.9 % bolus 500 mL (500 mLs Intravenous New Bag/Given 01/13/24 1405)    Reassessment after intervention: symptoms not significantly improved.    I did obtain Additional Historical Information from EMS.    Clinical Laboratory Tests Ordered, included CBC without leukocytosis.  Mild anemia at 11.2. K or 2.9. No AKI.   Radiologic Tests: Abdomen is diffusely soft and non-tender. Defer exam for now.   Cardiac Monitor Tracing which shows NSR.    Social Determinants of Health Risk patient is a non-smoker.   Consult complete with [facility]. Plan for obs admit.   Medical Decision Making: Summary:  Patient presents to the emergency department for evaluation of nausea, vomiting, diarrhea over the past several days.  She is clinically dehydrated.  No SIRS/sepsis vitals.  Doubt sepsis clinically.  Abdomen diffusely soft and nontender.  Plan for IV fluids, labs, UA, reassess.  Reevaluation with update and discussion with patient and son at bedside. In agreement with plan for admit.  Patient's presentation is most consistent with acute presentation with potential threat to life or bodily function.   Disposition: admit  ____________________________________________  FINAL CLINICAL IMPRESSION(S) / ED DIAGNOSES  Final diagnoses:  Nausea vomiting and diarrhea  Hypokalemia     Note:  This document was prepared using Dragon voice recognition software and may include unintentional  dictation errors.  Alona Bene, MD, [facility] Emergency Medicine    Allyce Bochicchio, Arlyss Repress, MD 01/13/24 [phone]

## 2024-01-13 NOTE — ED Notes (Signed)
Report rec'd from previous RN

## 2024-01-13 NOTE — ED Notes (Signed)
Pt noted to have 2 different boots on clothing not appropriate for weather. Pants were soiled with stool.

## 2024-01-14 DIAGNOSIS — I10 Essential (primary) hypertension: Secondary | ICD-10-CM | POA: Diagnosis not present

## 2024-01-14 DIAGNOSIS — F0280 Dementia in other diseases classified elsewhere without behavioral disturbance: Secondary | ICD-10-CM | POA: Diagnosis not present

## 2024-01-14 DIAGNOSIS — I482 Chronic atrial fibrillation, unspecified: Secondary | ICD-10-CM | POA: Diagnosis not present

## 2024-01-14 DIAGNOSIS — K591 Functional diarrhea: Secondary | ICD-10-CM | POA: Diagnosis not present

## 2024-01-14 DIAGNOSIS — E876 Hypokalemia: Secondary | ICD-10-CM | POA: Diagnosis not present

## 2024-01-14 DIAGNOSIS — E119 Type 2 diabetes mellitus without complications: Secondary | ICD-10-CM | POA: Diagnosis not present

## 2024-01-14 DIAGNOSIS — G301 Alzheimer's disease with late onset: Secondary | ICD-10-CM | POA: Diagnosis not present

## 2024-01-14 LAB — GASTROINTESTINAL PANEL BY PCR, STOOL (REPLACES STOOL CULTURE)

## 2024-01-14 LAB — BASIC METABOLIC PANEL
Anion gap: 8 (ref 5–15)
BUN: 8 mg/dL (ref 8–23)
CO2: 21 mmol/L — ABNORMAL LOW (ref 22–32)
Calcium: 8.4 mg/dL — ABNORMAL LOW (ref 8.9–10.3)
Chloride: 109 mmol/L (ref 98–111)
Creatinine, Ser: 0.65 mg/dL (ref 0.44–1.00)
GFR, Estimated: >60 mL/min (ref >60)
Glucose, Bld: 117 mg/dL — ABNORMAL HIGH (ref 70–99)
Potassium: 3.4 mmol/L — ABNORMAL LOW (ref 3.5–5.1)
Sodium: 138 mmol/L (ref 135–145)

## 2024-01-14 LAB — CBC
HCT: 31.4 % — ABNORMAL LOW (ref 36.0–46.0)
Hemoglobin: 9.9 g/dL — ABNORMAL LOW (ref 12.0–15.0)
MCH: 30.7 pg (ref 26.0–34.0)
MCHC: 31.5 g/dL (ref 30.0–36.0)
MCV: 97.5 fL (ref 80.0–100.0)
Platelets: 326 10*3/uL (ref 150–400)
RBC: 3.22 MIL/uL — ABNORMAL LOW (ref 3.87–5.11)
RDW: 14.9 % (ref 11.5–15.5)
WBC: 5.3 10*3/uL (ref 4.0–10.5)
nRBC: 0 % (ref 0.0–0.2)

## 2024-01-14 LAB — GLUCOSE, CAPILLARY
Glucose-Capillary: 121 mg/dL — ABNORMAL HIGH (ref 70–99)
Glucose-Capillary: 176 mg/dL — ABNORMAL HIGH (ref 70–99)
Glucose-Capillary: 121 mg/dL — ABNORMAL HIGH (ref 70–99)
Glucose-Capillary: 166 mg/dL — ABNORMAL HIGH (ref 70–99)

## 2024-01-14 LAB — MAGNESIUM: Magnesium: 2 mg/dL (ref 1.7–2.4)

## 2024-01-14 LAB — HEMOGLOBIN A1C
Hgb A1c MFr Bld: 6 % — ABNORMAL HIGH (ref 4.8–5.6)
Mean Plasma Glucose: 125.5 mg/dL

## 2024-01-14 MED ORDER — LOPERAMIDE HCL 2 MG PO CAPS
2.0000 mg | ORAL_CAPSULE | ORAL | Status: DC | PRN
  Administered 2024-01-14 – 2024-01-15 (×2): 2 mg via ORAL
  Filled 2024-01-14 (×2): qty 1

## 2024-01-14 NOTE — Progress Notes (Addendum)
PROGRESS NOTE    Jacqueline Ball  GLO:[id number] DOB: Nov 27, 1941 DOA: 01/13/2024 PCP: Sharmon Revere, MD   Brief Narrative:  Jacqueline Ball is a 83 y.o. female with medical history significant for atrial fibrillation on Eliquis, type 2 diabetes mellitus, hyperlipidemia, and memory loss who presents for evaluation of diarrhea and generalized weakness.  She was found to have profound hypokalemia.  Hospitalist called for admission  Assessment & Plan:   Principal Problem:   Diarrhea Active Problems:   Essential hypertension   Chronic atrial fibrillation (HCC)   Type 2 diabetes mellitus without complication, without long-term current use of insulin (HCC)   Late onset Alzheimer's disease without behavioral disturbance (HCC)   Hypokalemia   Acute onset diarrhea with dehydration and hypokalemia Rule out infectious etiology Rule out polypharmacy -C. difficile testing and GI panel negative -Initiate Imodium for symptom control -IV fluids and potassium supplementation as appropriate -Encourage p.o. intake -No signs or symptoms of overt infection   Atrial fibrillation  -Continue Eliquis, not on rate control chronically   Type II DM, well-controlled, non-insulin-dependent -Patient well-controlled, A1c 6.0 on metformin and diet control alone at home -Follow sliding scale, hypoglycemic protocol   Dementia, late onset Alzheimer's - Delirium precautions ongoing, appears to be at baseline   Generalized weakness Ambulatory dysfunction - No focal deficits identified -Likely secondary to above profound diarrhea and hypokalemia, ambulating independently today around room  DVT prophylaxis: apixaban (ELIQUIS) tablet 2.5 mg Start: 01/13/24 2200 apixaban (ELIQUIS) tablet 2.5 mg   Code Status:   Code Status: Full Code  Family Communication: Son updated over the phone  Status is: Inpatient  Dispo: The patient is from: Home              Anticipated d/c is to: Home              Anticipated d/c  date is: 48 to 72 hours pending resolution of above              Patient currently not medically stable for discharge given the need for ongoing monitoring, labs in the setting of profound hypokalemia  Consultants:  None  Procedures:  None  Antimicrobials:  None  Subjective: No acute issues or events overnight, patient resting comfortably in bed today, walks to the bathroom on her own, continues to have loose to soft stool.  Otherwise denies nausea vomiting constipation any fevers chills chest pain shortness of breath  Objective: Vitals:   01/13/24 1815 01/13/24 2120 01/14/24 0113 01/14/24 0529  BP: (!) 149/85 (!) 153/97 (!) 162/96 (!) 133/95  Pulse: 83 79 74 72  Resp: 18 14 14 14   Temp:  98.6 F (37 C) 98.5 F (36.9 C) 98.1 F (36.7 C)  TempSrc:  Oral Oral Oral  SpO2: 98% 100% 100% 100%  Weight:  51.9 kg      Intake/Output Summary (Last 24 hours) at 01/14/2024 0804 Last data filed at 01/13/2024 2221 Gross per 24 hour  Intake 508.31 ml  Output 108 ml  Net 400.31 ml   Filed Weights   01/13/24 2120  Weight: 51.9 kg    Examination:  General:  Pleasantly resting in bed, No acute distress.  Alert to person and general situation only HEENT:  Normocephalic atraumatic.  Sclerae nonicteric, noninjected.  Extraocular movements intact bilaterally. Neck:  Without mass or deformity.  Trachea is midline. Lungs:  Clear to auscultate bilaterally without rhonchi, wheeze, or rales. Heart:  Regular rate and rhythm.  Without murmurs, rubs, or gallops. Abdomen:  Soft,  nontender, nondistended.  Without guarding or rebound. Extremities: Without cyanosis, clubbing, edema, or obvious deformity. Skin:  Warm and dry, no erythema.  Data Reviewed: I have personally reviewed following labs and imaging studies  CBC: Recent Labs  Lab 01/13/24 1353 01/14/24 0622  WBC 9.0 5.3  NEUTROABS 6.9  --   HGB 11.2* 9.9*  HCT 33.4* 31.4*  MCV 94.1 97.5  PLT 388 326   Basic Metabolic  Panel: Recent Labs  Lab 01/13/24 1353 01/13/24 1451 01/14/24 0622  NA 138  --  138  K 2.9*  --  3.4*  CL 105  --  109  CO2 22  --  21*  GLUCOSE 123*  --  117*  BUN 14  --  8  CREATININE 0.63  --  0.65  CALCIUM 9.2  --  8.4*  MG  --  1.7 2.0   GFR: Estimated Creatinine Clearance: 45.2 mL/min (by C-G formula based on SCr of 0.65 mg/dL). Liver Function Tests: Recent Labs  Lab 01/13/24 1353  AST 19  ALT 20  ALKPHOS 75  BILITOT 0.9  PROT 7.2  ALBUMIN 3.9   Recent Labs  Lab 01/13/24 1353  LIPASE 26   No results for input(s): "AMMONIA" in the last 168 hours. Coagulation Profile: No results for input(s): "INR", "PROTIME" in the last 168 hours. Cardiac Enzymes: No results for input(s): "CKTOTAL", "CKMB", "CKMBINDEX", "TROPONINI" in the last 168 hours. BNP (last 3 results) No results for input(s): "PROBNP" in the last 8760 hours. HbA1C: No results for input(s): "HGBA1C" in the last 72 hours. CBG: Recent Labs  Lab 01/13/24 2208 01/14/24 0754  GLUCAP 97 121*   Lipid Profile: No results for input(s): "CHOL", "HDL", "LDLCALC", "TRIG", "CHOLHDL", "LDLDIRECT" in the last 72 hours. Thyroid Function Tests: No results for input(s): "TSH", "T4TOTAL", "FREET4", "T3FREE", "THYROIDAB" in the last 72 hours. Anemia Panel: No results for input(s): "VITAMINB12", "FOLATE", "FERRITIN", "TIBC", "IRON", "RETICCTPCT" in the last 72 hours. Sepsis Labs: No results for input(s): "PROCALCITON", "LATICACIDVEN" in the last 168 hours.  Recent Results (from the past 240 hours)  Resp panel by RT-PCR (RSV, Flu A&B, Covid) Anterior Nasal Swab     Status: None   Collection Time: 01/13/24  1:53 PM   Specimen: Anterior Nasal Swab  Result Value Ref Range Status   SARS Coronavirus 2 by RT PCR NEGATIVE NEGATIVE Final    Comment: (NOTE) SARS-CoV-2 target nucleic acids are NOT DETECTED.  The SARS-CoV-2 RNA is generally detectable in upper respiratory specimens during the acute phase of infection.  The lowest concentration of SARS-CoV-2 viral copies this assay can detect is 138 copies/mL. A negative result does not preclude SARS-Cov-2 infection and should not be used as the sole basis for treatment or other patient management decisions. A negative result may occur with  improper specimen collection/handling, submission of specimen other than nasopharyngeal swab, presence of viral mutation(s) within the areas targeted by this assay, and inadequate number of viral copies(<138 copies/mL). A negative result must be combined with clinical observations, patient history, and epidemiological information. The expected result is Negative.  Fact Sheet for Patients:  BloggerCourse.com  Fact Sheet for Healthcare Providers:  SeriousBroker.it  This test is no t yet approved or cleared by the [place] FDA and  has been authorized for detection and/or diagnosis of SARS-CoV-2 by FDA under an Emergency Use Authorization (EUA). This EUA will remain  in effect (meaning this test can be used) for the duration of the COVID-19 declaration under Section 564(b)(1) of  the Act, 21 U.S.C.section 360bbb-3(b)(1), unless the authorization is terminated  or revoked sooner.       Influenza A by PCR NEGATIVE NEGATIVE Final   Influenza B by PCR NEGATIVE NEGATIVE Final    Comment: (NOTE) The Xpert Xpress SARS-CoV-2/FLU/RSV plus assay is intended as an aid in the diagnosis of influenza from Nasopharyngeal swab specimens and should not be used as a sole basis for treatment. Nasal washings and aspirates are unacceptable for Xpert Xpress SARS-CoV-2/FLU/RSV testing.  Fact Sheet for Patients: BloggerCourse.com  Fact Sheet for Healthcare Providers: SeriousBroker.it  This test is not yet approved or cleared by the [place] FDA and has been authorized for detection and/or diagnosis of SARS-CoV-2 by FDA  under an Emergency Use Authorization (EUA). This EUA will remain in effect (meaning this test can be used) for the duration of the COVID-19 declaration under Section 564(b)(1) of the Act, 21 U.S.C. section 360bbb-3(b)(1), unless the authorization is terminated or revoked.     Resp Syncytial Virus by PCR NEGATIVE NEGATIVE Final    Comment: (NOTE) Fact Sheet for Patients: BloggerCourse.com  Fact Sheet for Healthcare Providers: SeriousBroker.it  This test is not yet approved or cleared by the [place] FDA and has been authorized for detection and/or diagnosis of SARS-CoV-2 by FDA under an Emergency Use Authorization (EUA). This EUA will remain in effect (meaning this test can be used) for the duration of the COVID-19 declaration under Section 564(b)(1) of the Act, 21 U.S.C. section 360bbb-3(b)(1), unless the authorization is terminated or revoked.  Performed at [facility], [address] [place], [place] [address]   C Difficile Quick Screen w PCR reflex     Status: None   Collection Time: 01/13/24  1:53 PM   Specimen: Urine, Clean Catch; Stool  Result Value Ref Range Status   C Diff antigen NEGATIVE NEGATIVE Final   C Diff toxin NEGATIVE NEGATIVE Final   C Diff interpretation No C. difficile detected.  Final    Comment: Performed at [facility], [address], [place], [place] [address]    Radiology Studies: No results found.  Scheduled Meds:  apixaban  2.5 mg Oral BID   atorvastatin  20 mg Oral Daily   insulin aspart  0-5 Units Subcutaneous QHS   insulin aspart  0-6 Units Subcutaneous TID WC   pantoprazole  40 mg Oral Daily   sodium chloride flush  3 mL Intravenous Q12H   Continuous Infusions:   LOS: 0 days   Time spent:  Azucena Fallen, DO [facility]  If 7PM-7AM, please contact night-coverage [email or web address]  01/14/2024, 8:04 AM

## 2024-01-14 NOTE — Plan of Care (Signed)
  Problem: Clinical Measurements: Goal: Diagnostic test results will improve Outcome: Progressing Goal: Respiratory complications will improve Outcome: Progressing Goal: Cardiovascular complication will be avoided Outcome: Progressing   Problem: Activity: Goal: Risk for activity intolerance will decrease Outcome: Progressing   Problem: Nutrition: Goal: Adequate nutrition will be maintained Outcome: Progressing   Problem: Elimination: Goal: Will not experience complications related to urinary retention Outcome: Progressing   Problem: Pain Managment: Goal: General experience of comfort will improve and/or be controlled Outcome: Progressing   Problem: Safety: Goal: Ability to remain free from injury will improve Outcome: Progressing   Problem: Skin Integrity: Goal: Risk for impaired skin integrity will decrease Outcome: Progressing   Problem: Fluid Volume: Goal: Ability to maintain a balanced intake and output will improve Outcome: Progressing   Problem: Metabolic: Goal: Ability to maintain appropriate glucose levels will improve Outcome: Progressing   Problem: Nutritional: Goal: Maintenance of adequate nutrition will improve Outcome: Progressing   Problem: Skin Integrity: Goal: Risk for impaired skin integrity will decrease Outcome: Progressing   Problem: Tissue Perfusion: Goal: Adequacy of tissue perfusion will improve Outcome: Progressing

## 2024-01-14 NOTE — Care Management Obs Status (Signed)
MEDICARE OBSERVATION STATUS NOTIFICATION   Patient Details  Name: Jacqueline Ball MRN: [id number] Date of Birth: 10/03/1941   Medicare Observation Status Notification Given:  Yes    Armanda Heritage, RN 01/14/2024, 12:20 PM

## 2024-01-14 NOTE — Plan of Care (Signed)
  Problem: Education: Goal: Knowledge of General Education information will improve Description: Including pain rating scale, medication(s)/side effects and non-pharmacologic comfort measures Outcome: Not Progressing   Problem: Health Behavior/Discharge Planning: Goal: Ability to manage health-related needs will improve Outcome: Not Progressing   Problem: Clinical Measurements: Goal: Ability to maintain clinical measurements within normal limits will improve Outcome: Not Progressing Goal: Will remain free from infection Outcome: Not Progressing Goal: Diagnostic test results will improve Outcome: Not Progressing Goal: Respiratory complications will improve Outcome: Not Progressing Goal: Cardiovascular complication will be avoided Outcome: Not Progressing   Problem: Activity: Goal: Risk for activity intolerance will decrease Outcome: Not Progressing   Problem: Nutrition: Goal: Adequate nutrition will be maintained Outcome: Not Progressing   Problem: Coping: Goal: Level of anxiety will decrease Outcome: Not Progressing   Problem: Elimination: Goal: Will not experience complications related to bowel motility Outcome: Not Progressing Goal: Will not experience complications related to urinary retention Outcome: Not Progressing   Problem: Pain Managment: Goal: General experience of comfort will improve and/or be controlled Outcome: Not Progressing   Problem: Safety: Goal: Ability to remain free from injury will improve Outcome: Not Progressing   Problem: Skin Integrity: Goal: Risk for impaired skin integrity will decrease Outcome: Not Progressing   Problem: Education: Goal: Ability to describe self-care measures that may prevent or decrease complications (Diabetes Survival Skills Education) will improve Outcome: Not Progressing Goal: Individualized Educational Video(s) Outcome: Not Progressing   Problem: Coping: Goal: Ability to adjust to condition or change in  health will improve Outcome: Not Progressing   Problem: Fluid Volume: Goal: Ability to maintain a balanced intake and output will improve Outcome: Not Progressing   Problem: Health Behavior/Discharge Planning: Goal: Ability to identify and utilize available resources and services will improve Outcome: Not Progressing Goal: Ability to manage health-related needs will improve Outcome: Not Progressing   Problem: Metabolic: Goal: Ability to maintain appropriate glucose levels will improve Outcome: Not Progressing   Problem: Nutritional: Goal: Maintenance of adequate nutrition will improve Outcome: Not Progressing Goal: Progress toward achieving an optimal weight will improve Outcome: Not Progressing   Problem: Skin Integrity: Goal: Risk for impaired skin integrity will decrease Outcome: Not Progressing   Problem: Tissue Perfusion: Goal: Adequacy of tissue perfusion will improve Outcome: Not Progressing   Problem: Education: Goal: Ability to describe self-care measures that may prevent or decrease complications (Diabetes Survival Skills Education) will improve Outcome: Not Progressing Goal: Individualized Educational Video(s) Outcome: Not Progressing   Problem: Coping: Goal: Ability to adjust to condition or change in health will improve Outcome: Not Progressing   Problem: Fluid Volume: Goal: Ability to maintain a balanced intake and output will improve Outcome: Not Progressing   Problem: Health Behavior/Discharge Planning: Goal: Ability to identify and utilize available resources and services will improve Outcome: Not Progressing Goal: Ability to manage health-related needs will improve Outcome: Not Progressing   Problem: Metabolic: Goal: Ability to maintain appropriate glucose levels will improve Outcome: Not Progressing   Problem: Nutritional: Goal: Maintenance of adequate nutrition will improve Outcome: Not Progressing Goal: Progress toward achieving an  optimal weight will improve Outcome: Not Progressing   Problem: Skin Integrity: Goal: Risk for impaired skin integrity will decrease Outcome: Not Progressing   Problem: Tissue Perfusion: Goal: Adequacy of tissue perfusion will improve Outcome: Not Progressing

## 2024-01-15 DIAGNOSIS — K591 Functional diarrhea: Secondary | ICD-10-CM | POA: Diagnosis not present

## 2024-01-15 DIAGNOSIS — F0280 Dementia in other diseases classified elsewhere without behavioral disturbance: Secondary | ICD-10-CM | POA: Diagnosis not present

## 2024-01-15 DIAGNOSIS — G301 Alzheimer's disease with late onset: Secondary | ICD-10-CM | POA: Diagnosis not present

## 2024-01-15 DIAGNOSIS — I482 Chronic atrial fibrillation, unspecified: Secondary | ICD-10-CM | POA: Diagnosis not present

## 2024-01-15 DIAGNOSIS — E876 Hypokalemia: Secondary | ICD-10-CM | POA: Diagnosis not present

## 2024-01-15 DIAGNOSIS — I10 Essential (primary) hypertension: Secondary | ICD-10-CM | POA: Diagnosis not present

## 2024-01-15 LAB — CBC
HCT: 32 % — ABNORMAL LOW (ref 36.0–46.0)
Hemoglobin: 10.2 g/dL — ABNORMAL LOW (ref 12.0–15.0)
MCH: 31.4 pg (ref 26.0–34.0)
MCHC: 31.9 g/dL (ref 30.0–36.0)
MCV: 98.5 fL (ref 80.0–100.0)
Platelets: 335 10*3/uL (ref 150–400)
RBC: 3.25 MIL/uL — ABNORMAL LOW (ref 3.87–5.11)
RDW: 14.7 % (ref 11.5–15.5)
WBC: 5.1 10*3/uL (ref 4.0–10.5)
nRBC: 0 % (ref 0.0–0.2)

## 2024-01-15 LAB — BASIC METABOLIC PANEL
Anion gap: 10 (ref 5–15)
BUN: 5 mg/dL — ABNORMAL LOW (ref 8–23)
CO2: 23 mmol/L (ref 22–32)
Calcium: 9.2 mg/dL (ref 8.9–10.3)
Chloride: 110 mmol/L (ref 98–111)
Creatinine, Ser: 0.45 mg/dL (ref 0.44–1.00)
GFR, Estimated: >60 mL/min (ref >60)
Glucose, Bld: 123 mg/dL — ABNORMAL HIGH (ref 70–99)
Potassium: 3.3 mmol/L — ABNORMAL LOW (ref 3.5–5.1)
Sodium: 143 mmol/L (ref 135–145)

## 2024-01-15 LAB — GLUCOSE, CAPILLARY
Glucose-Capillary: 117 mg/dL — ABNORMAL HIGH (ref 70–99)
Glucose-Capillary: 120 mg/dL — ABNORMAL HIGH (ref 70–99)

## 2024-01-15 MED ORDER — LOPERAMIDE HCL 2 MG PO CAPS: 2.0000 mg | ORAL_CAPSULE | Freq: Three times a day (TID) | ORAL | 0 refills | Status: DC | PRN

## 2024-01-15 MED ORDER — POTASSIUM CHLORIDE 20 MEQ PO PACK
40.0000 meq | PACK | Freq: Once | ORAL | Status: AC
  Administered 2024-01-15: 40 meq via ORAL
  Filled 2024-01-15: qty 2

## 2024-01-15 NOTE — Evaluation (Signed)
Physical Therapy Evaluation Patient Details Name: Jacqueline Ball MRN: [id number] DOB: [birth year] Today's Date: 01/15/2024  History of Present Illness  83 y.o. female presents for evaluation of diarrhea and generalized weakness. She was found to have profound hypokalemia.  Pt with medical history significant for atrial fibrillation on Eliquis, type 2 diabetes mellitus, hyperlipidemia, and memory loss.  Clinical Impression  Pt admitted with above diagnosis. Pt is oriented to self and location, she stated it is currently "March, 1980". Pt is not a reliable historian as she gave conflicting information about her home situation. She is mobilizing well, she ambulated 150' with a RW without loss of balance. Due to her confusion, she needs 24/7 supervision.  Pt currently with functional limitations due to the deficits listed below (see PT Problem List). Pt will benefit from acute skilled PT to increase their independence and safety with mobility to allow discharge.           If plan is discharge home, recommend the following: A little help with bathing/dressing/bathroom;Assistance with cooking/housework;Assist for transportation;Direct supervision/assist for medications management;Direct supervision/assist for financial management;Help with stairs or ramp for entrance   Can travel by private vehicle        Equipment Recommendations Rolling walker (2 wheels)  Recommendations for Other Services       Functional Status Assessment Patient has had a recent decline in their functional status and demonstrates the ability to make significant improvements in function in a reasonable and predictable amount of time.     Precautions / Restrictions Precautions Precautions: Fall Restrictions Weight Bearing Restrictions Per Provider Order: No      Mobility  Bed Mobility Overal bed mobility: Modified Independent                  Transfers Overall transfer level: Needs assistance Equipment used:  None Transfers: Sit to/from Stand Sit to Stand: Supervision           General transfer comment: supervision for safety    Ambulation/Gait Ambulation/Gait assistance: Contact guard assist Gait Distance (Feet): 150 Feet Assistive device: Rolling walker (2 wheels) Gait Pattern/deviations: Step-through pattern, Decreased stride length, Trunk flexed Gait velocity: WFL     General Gait Details: VCs for positioning in RW, no loss of balance  Stairs            Wheelchair Mobility     Tilt Bed    Modified Rankin (Stroke Patients Only)       Balance Overall balance assessment: Modified Independent                                           Pertinent Vitals/Pain Pain Assessment Pain Assessment: PAINAD Faces Pain Scale: No hurt Breathing: normal Negative Vocalization: none Facial Expression: smiling or inexpressive Body Language: relaxed Consolability: no need to console PAINAD Score: 0    Home Living Family/patient expects to be discharged to:: Unsure                   Additional Comments: pt not a reliable historian, she stated month is March, year is 64; at one point pt stated she lives with her son, then said she lives with her daughter, then said she doesn't know who she lives with    Prior Function Prior Level of Function : Patient poor historian/Family not available  Mobility Comments: pt stated she sometimes walks "with a stick"       Extremity/Trunk Assessment   Upper Extremity Assessment Upper Extremity Assessment: Overall WFL for tasks assessed    Lower Extremity Assessment Lower Extremity Assessment: Overall WFL for tasks assessed    Cervical / Trunk Assessment Cervical / Trunk Assessment: Normal  Communication   Communication Communication: No apparent difficulties  Cognition Arousal: Alert Behavior During Therapy: WFL for tasks assessed/performed Overall Cognitive Status: No family/caregiver  present to determine baseline cognitive functioning                                 General Comments: Knows she's in hospital and could sate her birthdate; stated month is "March" and year is "[birth year]"; poor historian, stated her son is a Community education officer and that her spouse died 1-2 weeks ago (unclear if this info is correct)        General Comments      Exercises     Assessment/Plan    PT Assessment Patient needs continued PT services  PT Problem List Decreased mobility;Decreased safety awareness;Decreased activity tolerance       PT Treatment Interventions Functional mobility training;Therapeutic activities;Patient/family education;Gait training;Balance training;Therapeutic exercise    PT Goals (Current goals can be found in the Care Plan section)  Acute Rehab PT Goals PT Goal Formulation: Patient unable to participate in goal setting Time For Goal Achievement: 01/29/24 Potential to Achieve Goals: Good    Frequency Min 1X/week     Co-evaluation               AM-PAC PT "6 Clicks" Mobility  Outcome Measure Help needed turning from your back to your side while in a flat bed without using bedrails?: None Help needed moving from lying on your back to sitting on the side of a flat bed without using bedrails?: None Help needed moving to and from a bed to a chair (including a wheelchair)?: None Help needed standing up from a chair using your arms (e.g., wheelchair or bedside chair)?: None Help needed to walk in hospital room?: A Little Help needed climbing 3-5 steps with a railing? : A Little 6 Click Score: 22    End of Session Equipment Utilized During Treatment: Gait belt Activity Tolerance: Patient tolerated treatment well Patient left: in chair;with call bell/phone within reach;with chair alarm set;with nursing/sitter in room Nurse Communication: Mobility status PT Visit Diagnosis: Difficulty in walking, not elsewhere classified (R26.2)    Time:  2130-8657 PT Time Calculation (min) (ACUTE ONLY): 18 min   Charges:   PT Evaluation $PT Eval Moderate Complexity: 1 Mod   PT General Charges $$ ACUTE PT VISIT: 1 Visit         Tamala Ser PT 01/15/2024  Acute Rehabilitation Services  Office [phone]

## 2024-01-15 NOTE — TOC Transition Note (Addendum)
Transition of Care [facility]) - Discharge Note   Patient Details  Name: Jacqueline Ball MRN: [id number] Date of Birth: [birth year]  [facility] CM/SW Contact:  Beckie Busing, RN Phone [phone]  01/15/2024, 4:02 PM   Clinical Narrative:    Patient with discharge orders. CM called son Dorma Russell to offer choice for [facility] agency. Dorma Russell states that he has an appointment with [facility] on tomorrow and would like to hold off on setting up therapy. CM offered option of setting up HH now and family can cancel if desired but son states that he prefers to wait for [facility]. No other needs noted.   Rolling walker has been ordered per [employer]. Nurse made aware, Dan Humphreys to be delivered to bedside.         Patient Goals and CMS Choice            Discharge Placement                       Discharge Plan and Services Additional resources added to the After Visit Summary for                                       Social Drivers of Health (SDOH) Interventions SDOH Screenings   Food Insecurity: No Food Insecurity (01/13/2024)  Housing: Patient Declined (01/14/2024)  Transportation Needs: No Transportation Needs (01/13/2024)  Utilities: Not At Risk (01/13/2024)  Social Connections: Patient Declined (01/14/2024)  Tobacco Use: Low Risk  (01/13/2024)     Readmission Risk Interventions     No data to display

## 2024-01-15 NOTE — Discharge Summary (Addendum)
Physician Discharge Summary   Patient: Jacqueline Ball MRN: [id number] DOB: 10/18/1941  Admit date:     01/13/2024  Discharge date: 01/15/24  Discharge Physician: Meredeth Ide   PCP: Sharmon Revere, MD   Recommendations at discharge:   Follow-up PCP in 1 week Patient to go home with home health PT.  Discharge Diagnoses: Principal Problem:   Diarrhea Active Problems:   Essential hypertension   Chronic atrial fibrillation (HCC)   Type 2 diabetes mellitus without complication, without long-term current use of insulin (HCC)   Late onset Alzheimer's disease without behavioral disturbance (HCC)   Hypokalemia  Resolved Problems:   * No resolved hospital problems. *  Hospital Course: 83 y.o. female with medical history significant for atrial fibrillation on Eliquis, type 2 diabetes mellitus, hyperlipidemia, and memory loss who presents for evaluation of diarrhea and generalized weakness.  She was found to have profound hypokalemia.   Assessment and Plan:  Acute onset diarrhea with dehydration and hypokalemia -Improved -Resolved, stool for C. difficile PCR negative -GI pathogen panel negative -Continue Imodium as needed  Hypokalemia -Potassium is 3.3 today -Will replace potassium with K-Dur 40 mg p.o. x 1 today before discharge    Atrial fibrillation  -Continue Eliquis, not on rate control chronically   Type II DM, well-controlled, non-insulin-dependent -Patient well-controlled, A1c 6.0 on metformin and diet control alone at home    Dementia, late onset Alzheimer's - Delirium precautions ongoing, appears to be at baseline   Generalized weakness Ambulatory dysfunction - No focal deficits identified -Likely secondary to above profound diarrhea and hypokalemia, ambulating independently today around room          Consultants:  Procedures performed: None Disposition: Home Diet recommendation:  Discharge Diet Orders (From admission, onward)     Start     Ordered    01/15/24 0000  Diet - low sodium heart healthy        01/15/24 1500           Carb modified diet DISCHARGE MEDICATION: Allergies as of 01/15/2024       Reactions   Metoprolol Nausea And Vomiting        Medication List     TAKE these medications    acetaminophen 500 MG tablet Commonly known as: TYLENOL Take 1,000 mg by mouth every 6 (six) hours as needed for pain.   atorvastatin 20 MG tablet Commonly known as: LIPITOR Take 20 mg by mouth daily.   diclofenac Sodium 1 % Gel Commonly known as: Voltaren Apply 2 g topically 4 (four) times daily. What changed:  how much to take when to take this reasons to take this   donepezil 5 MG tablet Commonly known as: ARICEPT Take 5 mg by mouth at bedtime.   Eliquis 2.5 MG Tabs tablet Generic drug: apixaban Take 2.5 mg by mouth 2 (two) times daily.   loperamide 2 MG capsule Commonly known as: IMODIUM Take 1 capsule (2 mg total) by mouth 3 (three) times daily as needed for diarrhea or loose stools.   METFORMIN HCL PO Take 750 mg by mouth daily.        Discharge Exam: Filed Weights   01/13/24 2120  Weight: 51.9 kg   General-appears in no acute distress Heart-S1-S2, regular, no murmur auscultated Lungs-clear to auscultation bilaterally, no wheezing or crackles auscultated Abdomen-soft, nontender, no organomegaly Extremities-no edema in the lower extremities Neuro-alert, oriented x3, no focal deficit noted  Condition at discharge: Good  The results of significant diagnostics from this hospitalization (  including imaging, microbiology, ancillary and laboratory) are listed below for reference.   Imaging Studies: CT Head Wo Contrast Result Date: 12/18/2023 CLINICAL DATA:  Mental status change, unknown cause. EXAM: CT HEAD WITHOUT CONTRAST TECHNIQUE: Contiguous axial images were obtained from the base of the skull through the vertex without intravenous contrast. RADIATION DOSE REDUCTION: This exam was performed  according to the departmental dose-optimization program which includes automated exposure control, adjustment of the mA and/or kV according to patient size and/or use of iterative reconstruction technique. COMPARISON:  Head CT 07/23/2023. FINDINGS: Brain: No acute intracranial hemorrhage. Gray-white differentiation is preserved. No hydrocephalus or extra-axial collection. No mass effect or midline shift. Vascular: No hyperdense vessel or unexpected calcification. Skull: No calvarial fracture or suspicious bone lesion. Skull base is unremarkable. Sinuses/Orbits: No acute finding. Other: None. IMPRESSION: No acute intracranial abnormality. Electronically Signed   By: Orvan Falconer M.D.   On: 12/18/2023 13:52   DG Chest Portable 1 View Result Date: 12/18/2023 CLINICAL DATA:  Dementia.  Possible gait disturbance. EXAM: PORTABLE CHEST 1 VIEW COMPARISON:  08/23/2023 FINDINGS: Lungs are adequately inflated and otherwise clear. Cardiomediastinal silhouette and remainder of the exam is unchanged. IMPRESSION: No active disease. Electronically Signed   By: Elberta Fortis M.D.   On: 12/18/2023 13:36    Microbiology: Results for orders placed or performed during the hospital encounter of 01/13/24  Resp panel by RT-PCR (RSV, Flu A&B, Covid) Anterior Nasal Swab     Status: None   Collection Time: 01/13/24  1:53 PM   Specimen: Anterior Nasal Swab  Result Value Ref Range Status   SARS Coronavirus 2 by RT PCR NEGATIVE NEGATIVE Final    Comment: (NOTE) SARS-CoV-2 target nucleic acids are NOT DETECTED.  The SARS-CoV-2 RNA is generally detectable in upper respiratory specimens during the acute phase of infection. The lowest concentration of SARS-CoV-2 viral copies this assay can detect is 138 copies/mL. A negative result does not preclude SARS-Cov-2 infection and should not be used as the sole basis for treatment or other patient management decisions. A negative result may occur with  improper specimen  collection/handling, submission of specimen other than nasopharyngeal swab, presence of viral mutation(s) within the areas targeted by this assay, and inadequate number of viral copies(<138 copies/mL). A negative result must be combined with clinical observations, patient history, and epidemiological information. The expected result is Negative.  Fact Sheet for Patients:  BloggerCourse.com  Fact Sheet for Healthcare Providers:  SeriousBroker.it  This test is no t yet approved or cleared by the [place] FDA and  has been authorized for detection and/or diagnosis of SARS-CoV-2 by FDA under an Emergency Use Authorization (EUA). This EUA will remain  in effect (meaning this test can be used) for the duration of the COVID-19 declaration under Section 564(b)(1) of the Act, 21 U.S.C.section 360bbb-3(b)(1), unless the authorization is terminated  or revoked sooner.       Influenza A by PCR NEGATIVE NEGATIVE Final   Influenza B by PCR NEGATIVE NEGATIVE Final    Comment: (NOTE) The Xpert Xpress SARS-CoV-2/FLU/RSV plus assay is intended as an aid in the diagnosis of influenza from Nasopharyngeal swab specimens and should not be used as a sole basis for treatment. Nasal washings and aspirates are unacceptable for Xpert Xpress SARS-CoV-2/FLU/RSV testing.  Fact Sheet for Patients: BloggerCourse.com  Fact Sheet for Healthcare Providers: SeriousBroker.it  This test is not yet approved or cleared by the [place] FDA and has been authorized for detection and/or diagnosis of SARS-CoV-2 by  FDA under an Emergency Use Authorization (EUA). This EUA will remain in effect (meaning this test can be used) for the duration of the COVID-19 declaration under Section 564(b)(1) of the Act, 21 U.S.C. section 360bbb-3(b)(1), unless the authorization is terminated or revoked.     Resp Syncytial  Virus by PCR NEGATIVE NEGATIVE Final    Comment: (NOTE) Fact Sheet for Patients: BloggerCourse.com  Fact Sheet for Healthcare Providers: SeriousBroker.it  This test is not yet approved or cleared by the [place] FDA and has been authorized for detection and/or diagnosis of SARS-CoV-2 by FDA under an Emergency Use Authorization (EUA). This EUA will remain in effect (meaning this test can be used) for the duration of the COVID-19 declaration under Section 564(b)(1) of the Act, 21 U.S.C. section 360bbb-3(b)(1), unless the authorization is terminated or revoked.  Performed at [facility], [address] [place], [place] [address]   C Difficile Quick Screen w PCR reflex     Status: None   Collection Time: 01/13/24  1:53 PM   Specimen: Urine, Clean Catch; Stool  Result Value Ref Range Status   C Diff antigen NEGATIVE NEGATIVE Final   C Diff toxin NEGATIVE NEGATIVE Final   C Diff interpretation No C. difficile detected.  Final    Comment: Performed at [facility], [address] [place], [place] [address]  Gastrointestinal Panel by PCR , Stool     Status: None   Collection Time: 01/13/24  3:12 PM   Specimen: Urine, Clean Catch; Stool  Result Value Ref Range Status   Campylobacter species NOT DETECTED NOT DETECTED Final   Plesimonas shigelloides NOT DETECTED NOT DETECTED Final   Salmonella species NOT DETECTED NOT DETECTED Final   Yersinia enterocolitica NOT DETECTED NOT DETECTED Final   Vibrio species NOT DETECTED NOT DETECTED Final   Vibrio cholerae NOT DETECTED NOT DETECTED Final   Enteroaggregative E coli (EAEC) NOT DETECTED NOT DETECTED Final   Enteropathogenic E coli (EPEC) NOT DETECTED NOT DETECTED Final   Enterotoxigenic E coli (ETEC) NOT DETECTED NOT DETECTED Final   Shiga like toxin producing E coli (STEC) NOT DETECTED NOT DETECTED Final   Shigella/Enteroinvasive E coli (EIEC) NOT DETECTED NOT  DETECTED Final   Cryptosporidium NOT DETECTED NOT DETECTED Final   Cyclospora cayetanensis NOT DETECTED NOT DETECTED Final   Entamoeba histolytica NOT DETECTED NOT DETECTED Final   Giardia lamblia NOT DETECTED NOT DETECTED Final   Adenovirus F40/41 NOT DETECTED NOT DETECTED Final   Astrovirus NOT DETECTED NOT DETECTED Final   Norovirus GI/GII NOT DETECTED NOT DETECTED Final   Rotavirus A NOT DETECTED NOT DETECTED Final   Sapovirus (I, II, IV, and V) NOT DETECTED NOT DETECTED Final    Comment: Performed at [facility], [address] [place], [place] [address]    Labs: CBC: Recent Labs  Lab 01/13/24 1353 01/14/24 0622 01/15/24 0747  WBC 9.0 5.3 5.1  NEUTROABS 6.9  --   --   HGB 11.2* 9.9* 10.2*  HCT 33.4* 31.4* 32.0*  MCV 94.1 97.5 98.5  PLT 388 326 335   Basic Metabolic Panel: Recent Labs  Lab 01/13/24 1353 01/13/24 1451 01/14/24 0622 01/15/24 0747  NA 138  --  138 143  K 2.9*  --  3.4* 3.3*  CL 105  --  109 110  CO2 22  --  21* 23  GLUCOSE 123*  --  117* 123*  BUN 14  --  8 5*  CREATININE 0.63  --  0.65 0.45  CALCIUM 9.2  --  8.4* 9.2  MG  --  1.7 2.0  --    Liver Function Tests: Recent Labs  Lab 01/13/24 1353  AST 19  ALT 20  ALKPHOS 75  BILITOT 0.9  PROT 7.2  ALBUMIN 3.9   CBG: Recent Labs  Lab 01/14/24 1132 01/14/24 1625 01/14/24 2038 01/15/24 0837 01/15/24 1139  GLUCAP 176* 121* 166* 117* 120*    Discharge time spent: Greater than 30 minutes.  Signed: Meredeth Ide, MD [facility] 01/15/2024

## 2024-01-15 NOTE — Progress Notes (Addendum)
AVS reviewed with PT son Jacqueline Ball no inquires made. PT set up with [facility] support post Physical Therapy evaluation. PT son will be here around 1630 to escort her to lobby via wheelchair. Jacqueline Ball will pick up rolling walker tomorrow when available. PT has no complaints of pain, SOB or discomfort.

## 2024-01-17 DIAGNOSIS — Z79899 Other long term (current) drug therapy: Secondary | ICD-10-CM | POA: Diagnosis not present

## 2024-02-13 DIAGNOSIS — Z794 Long term (current) use of insulin: Secondary | ICD-10-CM | POA: Diagnosis not present

## 2024-02-13 DIAGNOSIS — E1142 Type 2 diabetes mellitus with diabetic polyneuropathy: Secondary | ICD-10-CM | POA: Diagnosis not present

## 2024-02-26 ENCOUNTER — Other Ambulatory Visit: Payer: Self-pay | Admitting: Family Medicine

## 2024-02-26 ENCOUNTER — Ambulatory Visit
Admission: RE | Admit: 2024-02-26 | Discharge: 2024-02-26 | Disposition: A | Discharge status: Home / Self Care | Source: Ambulatory Visit | Attending: Family Medicine | Admitting: Family Medicine

## 2024-02-26 DIAGNOSIS — M542 Cervicalgia: Secondary | ICD-10-CM

## 2024-03-15 ENCOUNTER — Encounter (HOSPITAL_COMMUNITY): Payer: Self-pay

## 2024-03-15 ENCOUNTER — Emergency Department (HOSPITAL_COMMUNITY)

## 2024-03-15 ENCOUNTER — Other Ambulatory Visit: Payer: Self-pay

## 2024-03-15 ENCOUNTER — Emergency Department (HOSPITAL_COMMUNITY)
Admission: EM | Admit: 2024-03-15 | Discharge: 2024-03-15 | Disposition: A | Discharge status: Home / Self Care | Attending: Emergency Medicine | Admitting: Emergency Medicine

## 2024-03-15 DIAGNOSIS — N390 Urinary tract infection, site not specified: Secondary | ICD-10-CM | POA: Diagnosis not present

## 2024-03-15 DIAGNOSIS — M25512 Pain in left shoulder: Secondary | ICD-10-CM | POA: Insufficient documentation

## 2024-03-15 DIAGNOSIS — M25511 Pain in right shoulder: Secondary | ICD-10-CM | POA: Insufficient documentation

## 2024-03-15 DIAGNOSIS — E1142 Type 2 diabetes mellitus with diabetic polyneuropathy: Secondary | ICD-10-CM | POA: Diagnosis not present

## 2024-03-15 DIAGNOSIS — Z79899 Other long term (current) drug therapy: Secondary | ICD-10-CM | POA: Insufficient documentation

## 2024-03-15 DIAGNOSIS — Z743 Need for continuous supervision: Secondary | ICD-10-CM | POA: Diagnosis not present

## 2024-03-15 DIAGNOSIS — Z7901 Long term (current) use of anticoagulants: Secondary | ICD-10-CM | POA: Insufficient documentation

## 2024-03-15 DIAGNOSIS — R4182 Altered mental status, unspecified: Secondary | ICD-10-CM | POA: Diagnosis not present

## 2024-03-15 DIAGNOSIS — M25519 Pain in unspecified shoulder: Secondary | ICD-10-CM | POA: Diagnosis not present

## 2024-03-15 DIAGNOSIS — R410 Disorientation, unspecified: Secondary | ICD-10-CM | POA: Diagnosis not present

## 2024-03-15 DIAGNOSIS — D649 Anemia, unspecified: Secondary | ICD-10-CM | POA: Diagnosis not present

## 2024-03-15 DIAGNOSIS — M79603 Pain in arm, unspecified: Secondary | ICD-10-CM | POA: Diagnosis not present

## 2024-03-15 DIAGNOSIS — Z794 Long term (current) use of insulin: Secondary | ICD-10-CM | POA: Diagnosis not present

## 2024-03-15 DIAGNOSIS — F0390 Unspecified dementia without behavioral disturbance: Secondary | ICD-10-CM | POA: Diagnosis not present

## 2024-03-15 LAB — COMPREHENSIVE METABOLIC PANEL WITH GFR
ALT: 19 U/L (ref 0–44)
AST: 21 U/L (ref 15–41)
Albumin: 4.2 g/dL (ref 3.5–5.0)
Alkaline Phosphatase: 82 U/L (ref 38–126)
Anion gap: 9 (ref 5–15)
BUN: 18 mg/dL (ref 8–23)
CO2: 27 mmol/L (ref 22–32)
Calcium: 9.9 mg/dL (ref 8.9–10.3)
Chloride: 105 mmol/L (ref 98–111)
Creatinine, Ser: 0.84 mg/dL (ref 0.44–1.00)
GFR, Estimated: >60 mL/min (ref >60)
Glucose, Bld: 135 mg/dL — ABNORMAL HIGH (ref 70–99)
Potassium: 3.8 mmol/L (ref 3.5–5.1)
Sodium: 141 mmol/L (ref 135–145)
Total Bilirubin: 0.3 mg/dL (ref 0.0–1.2)
Total Protein: 7.5 g/dL (ref 6.5–8.1)

## 2024-03-15 LAB — CBC WITH DIFFERENTIAL/PLATELET
Abs Immature Granulocytes: 0.02 10*3/uL (ref 0.00–0.07)
Basophils Absolute: 0 10*3/uL (ref 0.0–0.1)
Basophils Relative: 0 %
Eosinophils Absolute: 0 10*3/uL (ref 0.0–0.5)
Eosinophils Relative: 0 %
HCT: 36.1 % (ref 36.0–46.0)
Hemoglobin: 11.6 g/dL — ABNORMAL LOW (ref 12.0–15.0)
Immature Granulocytes: 0 %
Lymphocytes Relative: 29 %
Lymphs Abs: 2 10*3/uL (ref 0.7–4.0)
MCH: 31.6 pg (ref 26.0–34.0)
MCHC: 32.1 g/dL (ref 30.0–36.0)
MCV: 98.4 fL (ref 80.0–100.0)
Monocytes Absolute: 0.5 10*3/uL (ref 0.1–1.0)
Monocytes Relative: 8 %
Neutro Abs: 4.4 10*3/uL (ref 1.7–7.7)
Neutrophils Relative %: 63 %
Platelets: 345 10*3/uL (ref 150–400)
RBC: 3.67 MIL/uL — ABNORMAL LOW (ref 3.87–5.11)
RDW: 14.1 % (ref 11.5–15.5)
WBC: 7 10*3/uL (ref 4.0–10.5)
nRBC: 0 % (ref 0.0–0.2)

## 2024-03-15 LAB — URINALYSIS, ROUTINE W REFLEX MICROSCOPIC
Bilirubin Urine: NEGATIVE
Glucose, UA: NEGATIVE mg/dL
Hgb urine dipstick: NEGATIVE
Ketones, ur: NEGATIVE mg/dL
Nitrite: POSITIVE — AB
Protein, ur: NEGATIVE mg/dL
Specific Gravity, Urine: 1.015 (ref 1.005–1.030)
pH: 5 (ref 5.0–8.0)

## 2024-03-15 LAB — RAPID URINE DRUG SCREEN, HOSP PERFORMED
Amphetamines: NOT DETECTED
Barbiturates: NOT DETECTED
Benzodiazepines: NOT DETECTED
Cocaine: NOT DETECTED
Opiates: NOT DETECTED
Tetrahydrocannabinol: NOT DETECTED

## 2024-03-15 LAB — TROPONIN I (HIGH SENSITIVITY): Troponin I (High Sensitivity): 3 ng/L (ref <18)

## 2024-03-15 LAB — ETHANOL: Alcohol, Ethyl (B): <10 mg/dL (ref <10)

## 2024-03-15 MED ORDER — SODIUM CHLORIDE 0.9 % IV SOLN: 1.0000 g | Freq: Once | INTRAVENOUS | Status: DC

## 2024-03-15 MED ORDER — STERILE WATER FOR INJECTION IJ SOLN
INTRAMUSCULAR | Status: AC
Start: 2024-03-15 — End: 2024-03-15
  Filled 2024-03-15: qty 10

## 2024-03-15 MED ORDER — CEPHALEXIN 500 MG PO CAPS: 500.0000 mg | ORAL_CAPSULE | Freq: Three times a day (TID) | ORAL | 0 refills | Status: DC

## 2024-03-15 MED ORDER — CEFTRIAXONE SODIUM 1 G IJ SOLR
1.0000 g | Freq: Once | INTRAMUSCULAR | Status: AC
  Administered 2024-03-15: 1 g via INTRAMUSCULAR
  Filled 2024-03-15: qty 10

## 2024-03-15 NOTE — ED Triage Notes (Signed)
 BIB  EMS from home, C/O bilatteral shoulder pain, has dementia, son main caregiver, Aox2, no reports of falls  BP 128/60 Pulse 80 BG 179

## 2024-03-15 NOTE — ED Provider Notes (Signed)
 Jacqueline Ball EMERGENCY DEPARTMENT AT [facility] Provider Note   CSN: [id number] Arrival date & time: 03/15/24  1630     History {Add pertinent medical, surgical, social history, OB history to HPI:1} Chief Complaint  Patient presents with   Shoulder Pain    Bilateral     Jacqueline Ball is a 83 y.o. female.  He is brought in by ambulance from home.  Reportedly lives with her son.  Has dementia.  Level 5 caveat.  She endorses pain in both her shoulders for a few days.  Unclear of fall.  EMS tells me that she calls 911 frequently and she has been wandering more.  He denies any chest pain or abdominal pain.  She talks mostly about her husband who has passed away in the past.   The history is provided by the patient and the EMS personnel.  Shoulder Pain Location:  Shoulder Shoulder location:  L shoulder and R shoulder Injury: no   Pain details:    Quality:  Unable to specify   Duration:  3 days      Home Medications Prior to Admission medications   Medication Sig Start Date End Date Taking? Authorizing Provider  acetaminophen (TYLENOL) 500 MG tablet Take 1,000 mg by mouth every 6 (six) hours as needed for pain.    [provider]  apixaban (ELIQUIS) 2.5 MG TABS tablet Take 2.5 mg by mouth 2 (two) times daily.    [provider]  atorvastatin (LIPITOR) 20 MG tablet Take 20 mg by mouth daily.    [provider]  diclofenac Sodium (VOLTAREN) 1 % GEL Apply 2 g topically 4 (four) times daily. Patient taking differently: Apply 1 Application topically as needed (pain). 09/13/23   Jeannie Fend, PA-C  donepezil (ARICEPT) 5 MG tablet Take 5 mg by mouth at bedtime. 01/02/24   [provider]  loperamide (IMODIUM) 2 MG capsule Take 1 capsule (2 mg total) by mouth 3 (three) times daily as needed for diarrhea or loose stools. 01/15/24   Meredeth Ide, MD  METFORMIN HCL PO Take 750 mg by mouth daily.    [provider]      Allergies     Metoprolol    Review of Systems   Review of Systems  Physical Exam Updated Vital Signs BP 112/76 (BP Location: Right Arm)   Pulse 64   Temp 98 F (36.7 C) (Oral)   Resp 18   Ht 5\' 5"  (1.651 m)   Wt 59.9 kg   SpO2 97%   BMI 21.97 kg/m  Physical Exam Vitals and nursing note reviewed.  Constitutional:      General: She is not in acute distress.    Appearance: Normal appearance. She is well-developed.  HENT:     Head: Normocephalic and atraumatic.  Eyes:     Conjunctiva/sclera: Conjunctivae normal.  Cardiovascular:     Rate and Rhythm: Normal rate and regular rhythm.     Heart sounds: No murmur heard. Pulmonary:     Effort: Pulmonary effort is normal. No respiratory distress.     Breath sounds: Normal breath sounds.  Abdominal:     Palpations: Abdomen is soft.     Tenderness: There is no abdominal tenderness. There is no guarding or rebound.  Musculoskeletal:        General: No tenderness, deformity or signs of injury. Normal range of motion.     Cervical back: Neck supple.  Skin:    General: Skin is  warm and dry.     Capillary Refill: Capillary refill takes less than 2 seconds.  Neurological:     General: No focal deficit present.     Mental Status: She is alert. She is disoriented.     Motor: No weakness.     ED Results / Procedures / Treatments   Labs (all labs ordered are listed, but only abnormal results are displayed) Labs Reviewed - No data to display  EKG None  Radiology No results found.  Procedures Procedures  {Document cardiac monitor, telemetry assessment procedure when appropriate:1}  Medications Ordered in ED Medications - No data to display  ED Course/ Medical Decision Making/ A&P   {   Click here for ABCD2, HEART and other calculatorsREFRESH Note before signing :1}                              Medical Decision Making Amount and/or Complexity of Data Reviewed Labs: ordered. Radiology: ordered.   This patient complains of ***;  this involves an extensive number of treatment Options and is a complaint that carries with it a high risk of complications and morbidity. The differential includes ***  I ordered, reviewed and interpreted labs, which included *** I ordered medication *** and reviewed PMP when indicated. I ordered imaging studies which included *** and I independently    visualized and interpreted imaging which showed *** Additional history obtained from *** Previous records obtained and reviewed *** I consulted *** and discussed lab and imaging findings and discussed disposition.  Cardiac monitoring reviewed, *** Social determinants considered, *** Critical Interventions: ***  After the interventions stated above, I reevaluated the patient and found *** Admission and further testing considered, ***   {Document critical care time when appropriate:1} {Document review of labs and clinical decision tools ie heart score, Chads2Vasc2 etc:1}  {Document your independent review of radiology images, and any outside records:1} {Document your discussion with family members, caretakers, and with consultants:1} {Document social determinants of health affecting pt's care:1} {Document your decision making why or why not admission, treatments were needed:1} Final Clinical Impression(s) / ED Diagnoses Final diagnoses:  None    Rx / DC Orders ED Discharge Orders     None

## 2024-03-15 NOTE — ED Notes (Signed)
 Provider informed EMT-P that pt son had been contacted and was coming to take pt home. Pt informed of such.

## 2024-03-16 ENCOUNTER — Emergency Department (HOSPITAL_COMMUNITY)
Admission: EM | Admit: 2024-03-16 | Discharge: 2024-03-16 | Disposition: A | Discharge status: Home / Self Care | Attending: Emergency Medicine | Admitting: Emergency Medicine

## 2024-03-16 ENCOUNTER — Encounter (HOSPITAL_COMMUNITY): Payer: Self-pay

## 2024-03-16 ENCOUNTER — Other Ambulatory Visit: Payer: Self-pay

## 2024-03-16 ENCOUNTER — Emergency Department (HOSPITAL_COMMUNITY)

## 2024-03-16 DIAGNOSIS — Z7901 Long term (current) use of anticoagulants: Secondary | ICD-10-CM | POA: Diagnosis not present

## 2024-03-16 DIAGNOSIS — M79602 Pain in left arm: Secondary | ICD-10-CM | POA: Diagnosis not present

## 2024-03-16 DIAGNOSIS — M79621 Pain in right upper arm: Secondary | ICD-10-CM | POA: Diagnosis not present

## 2024-03-16 DIAGNOSIS — M79601 Pain in right arm: Secondary | ICD-10-CM

## 2024-03-16 DIAGNOSIS — M25519 Pain in unspecified shoulder: Secondary | ICD-10-CM | POA: Diagnosis not present

## 2024-03-16 DIAGNOSIS — M79622 Pain in left upper arm: Secondary | ICD-10-CM | POA: Diagnosis not present

## 2024-03-16 MED ORDER — ACETAMINOPHEN 500 MG PO TABS
1000.0000 mg | ORAL_TABLET | Freq: Once | ORAL | Status: AC
  Administered 2024-03-16: 1000 mg via ORAL
  Filled 2024-03-16: qty 2

## 2024-03-16 NOTE — ED Provider Notes (Signed)
  Physical Exam  BP (!) 128/108 (BP Location: Right Arm)   Pulse 64   Temp 98.2 F (36.8 C) (Oral)   Resp 16   Ht 5\' 5"  (1.651 m)   Wt 59.8 kg   SpO2 100%   BMI 21.94 kg/m   Physical Exam  Procedures  Procedures  ED Course / MDM    Medical Decision Making Amount and/or Complexity of Data Reviewed Radiology: ordered.  Risk OTC drugs.   2nd time in recent Bilateral arm pain Xrays pending Per social work - 2nd time patient found to be wandering - no APS report needed - son to pick her up.  No fractures on xrays. Patient is eating and drinking well. In NAD. She can be discharged to the car of her son.        Elpidio Anis, PA-C 03/16/24 1544    Gloris Manchester, MD 03/17/24 [phone]

## 2024-03-16 NOTE — Discharge Instructions (Signed)
 The xrays of the arms were negative for any injury. If pain persists, please follow up with your doctor for further evaluation.

## 2024-03-16 NOTE — Progress Notes (Addendum)
 2:50pm: CSW received call from patient's son Dorma Russell who states patient leaves the house due to her "not getting her way." Dorma Russell states patient refuses to follow what rules and structure he has in place for her safety. Dorma Russell states he will come and retrieve patient about 4:30pm. Dorma Russell and CSW discussed applying for Medicaid to be proactive for future discharge planning for possible LTC or PCS services.  CSW provided brief instructions on how to apply for Medicaid.  CSW informed PA of plan.  1:05pm: CSW received call from [place] regarding safety concerns she has for patient as she was found wandering outside twice by [employer] in the last 24 hours. PA states patient's son Dorma Russell is her primary caregiver.  CSW attempted to reach patient's son Dorma Russell without success - a voicemail was left requesting a return call. A text message was also sent requesting a return call.  CSW attempted to reach patient's granddaughter Louanne Belton without success - number appears to be disconnected.  Edwin Dada, MSW, LCSW Transitions of Care  Clinical Social Worker II [phone]

## 2024-03-16 NOTE — ED Notes (Signed)
 Food and fluids given to patient, tolerated well

## 2024-03-16 NOTE — ED Notes (Signed)
Son arrived to transport  patient home.

## 2024-03-16 NOTE — ED Notes (Signed)
 Patient transported to X-ray

## 2024-03-16 NOTE — ED Notes (Signed)
 RN attempted to contact patient son Dorma Russell to inform him that his mother has checked into ED, unable to reach VM left.

## 2024-03-16 NOTE — ED Triage Notes (Signed)
 BIB EMS from the side of the road for bilateral arm pain. Pt unsure if she fell. Hx of dementia. Alert and orientedx3. Son is main caregiver.
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
## 2024-03-18 LAB — URINE CULTURE: Culture: >=100000 — AB

## 2024-03-19 ENCOUNTER — Telehealth (HOSPITAL_BASED_OUTPATIENT_CLINIC_OR_DEPARTMENT_OTHER): Payer: Self-pay

## 2024-03-19 NOTE — Telephone Encounter (Signed)
 Post ED Visit - Positive Culture Follow-up  Culture report reviewed by antimicrobial stewardship pharmacist: [facility] []  Enzo Bi, Pharm.D. []  Celedonio Miyamoto, Pharm.D., BCPS AQ-ID []  Garvin Fila, Pharm.D., BCPS []  [place]ina Pillion, Pharm.D., BCPS []  [place], [place].D., BCPS, AAHIVP []  Estella Husk, Pharm.D., BCPS, AAHIVP []  Lysle Pearl, PharmD, BCPS []  Phillips Climes, PharmD, BCPS []  Agapito Games, PharmD, BCPS []  Verlan Friends, PharmD []  Mervyn Gay, PharmD, BCPS []  Vinnie Level, PharmD  [facility] [x]  Sharin Mons, PharmD []  Greer Pickerel, PharmD []  Adalberto Cole, PharmD []  Perlie Gold, Rph []  Lonell Face) Jean Rosenthal, PharmD []  Earl Many, PharmD []  Junita Push, PharmD []  Dorna Leitz, PharmD []  Terrilee Files, PharmD []  Lynann Beaver, PharmD []  Keturah Barre, PharmD []  Loralee Pacas, PharmD []  Bernadene Person, PharmD   Positive urine culture Treated with Cephalexin, organism sensitive to the same and no further patient follow-up is required at this time.  Jacqueline Ball 03/19/2024, 9:03 AM

## 2024-03-20 ENCOUNTER — Ambulatory Visit (HOSPITAL_COMMUNITY)
Admission: EM | Admit: 2024-03-20 | Discharge: 2024-03-20 | Disposition: A | Discharge status: Home / Self Care | Attending: Behavioral Health | Admitting: Behavioral Health

## 2024-03-20 DIAGNOSIS — Z046 Encounter for general psychiatric examination, requested by authority: Secondary | ICD-10-CM | POA: Diagnosis not present

## 2024-03-20 DIAGNOSIS — G301 Alzheimer's disease with late onset: Secondary | ICD-10-CM | POA: Insufficient documentation

## 2024-03-20 DIAGNOSIS — E119 Type 2 diabetes mellitus without complications: Secondary | ICD-10-CM | POA: Diagnosis not present

## 2024-03-20 DIAGNOSIS — R4189 Other symptoms and signs involving cognitive functions and awareness: Secondary | ICD-10-CM

## 2024-03-20 DIAGNOSIS — F0282 Dementia in other diseases classified elsewhere, unspecified severity, with psychotic disturbance: Secondary | ICD-10-CM | POA: Insufficient documentation

## 2024-03-20 DIAGNOSIS — Z87440 Personal history of urinary (tract) infections: Secondary | ICD-10-CM | POA: Insufficient documentation
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
## 2024-03-20 NOTE — ED Notes (Signed)
 Patient discharged in no acute distress. Belongings returned from "pink" locker. Pt escorted to the front lobby where her son was waiting. AVS given and reviewed. Understanding voiced.

## 2024-03-20 NOTE — Discharge Instructions (Addendum)
 Based on your evaluation today for cognitive impairment please follow up with the information below for neurology for outpatient services for neurocognitive disorders. Please contact one of the facilities below to establish new patient services. Treatment for neurocognitive disorders (NCDs), which include conditions like dementia and Alzheimer's disease, focuses on managing symptoms, improving quality of life, and slowing cognitive decline through a combination of medications, therapies, and supportive care.  [facility] Address: [address] [place], [place] [address] Phone: [phone]  [facility] Address: [address], [place], [place] [address] Phone: [phone]  [facility] Address: [address], [place], [place] [address] Phone: [phone]  Urinary Tract Infection: You were diagnosed with a UTI on March 15, 2024 which may be worsening psychiatric symptoms (hallucinations). Please pick up your medication cephalexin (Keflex) 500 mg 3 times a day from the pharmacy to treat urinary tract symptoms.    Dementia Caregiver Guide Dementia is a condition that affects the way the brain works. It often affects thinking and memory. A person with dementia may: Forget things. Have trouble talking or responding to your questions. Have trouble paying attention. Have trouble thinking clearly and making good decisions. Get lost or wander away from home or other places. Have big changes in their mood or emotions. They may: Feel very worried, nervous, or depressed. Have angry outbursts. Be suspicious or accuse you of things. Have childlike behavior and language. Taking care of someone with dementia can be a challenge. The tips below can help you care for the person. How to help manage lifestyle changes Dementia usually gets worse slowly over time. In the early stages, people with  dementia can stay safe and take care of themselves with some help. In later stages, they need help with daily tasks like getting dressed, grooming, and going to the bathroom. Communicating When the person is talking and seems frustrated, make eye contact and hold the person's hand. Ask questions that can be answered with a yes or no. Use simple words and a calm voice. Only give one direction at a time. Limit choices for the person. Too many choices can be stressful. Avoid correcting the person in a negative way. If the person can't find the right words, gently try to help. Preventing injury  Keep floors clear. Remove rugs, magazine racks, and floor lamps. Keep hallways well lit, especially at night. Put a handrail and nonslip mat in the bathtub or shower. Put childproof locks on cabinets that have dangerous items in them. These items include medicine, alcohol, guns, cleaning products, and sharp tools. For doors to the outside, put locks where the person can't see or reach them. This helps keep the person from going out of the house and getting lost. Be ready for emergencies. Keep a list of emergency phone numbers and addresses close by. Remove car keys and lock garage doors so the person doesn't try to drive. Have the person wear a bracelet that tracks where they are and shows that they're a person with memory loss. This should be worn at all times for safety. Helping with daily life  Keep the person on track with their daily routine. Try to identify areas where the person may need help. Be supportive, patient, calm, and encouraging. Gently remind the person that adjusting to changes takes time. Help with the tasks that the person has asked for help with. Keep the person involved in daily tasks and  decisions as much as you can. Encourage conversation, but try not to get frustrated if the person struggles to find words or doesn't seem to appreciate your help. Other tips Think about any  safety risks and take steps to avoid them. Keep things organized: Organize medicines in a pill box for each day of the week. Keep a calendar in a central place. Use it to remind the person of health care visits or other activities. Create a plan to handle any legal or financial matters. Get help from a professional if needed. Help make sure the person: Takes medicines only as told by their health care providers. Eats regular, healthy meals. They should also drink plenty of fluids. Goes to all scheduled health care appointments. Gets regular sleep. Taking care of yourself Being a caregiver for someone with dementia can be hard. You may feel stressed and have many other emotions. It's important to also take care of yourself. Here are some tips: Find out about services that can provide short-term care for the person. This is called respite care. It can allow you to take a break when you need one. Find healthy ways to deal with stress. Some ways include: Spending time with other people. Exercising. Meditating or doing deep breathing exercises. Take care of your own health by: Getting enough sleep. Eating healthy foods. Getting regular exercise. Join a support group with others who are caregivers. These groups can help you: Learn other ways to deal with stress. Share experiences with others. Get emotional comfort and support. Learn about caregiving as the disease gets worse. Find resources in your community. Where to find support: Many people and organizations offer support. These include: Support groups for people with dementia. Support groups for caregivers. Counselors or therapists. Home health care services. Adult day care centers. Where to find more information Alzheimer's Association: WesternTunes.it Family Caregiver Alliance: caregiver.org Alzheimer's Foundation of [place]: alzfdn.org Contact a health care provider if: The person's health is quickly getting worse. You're no longer  able to care for the person. Caring for the person is affecting your physical and emotional health. You're feeling worried, nervous, or depressed about caring for the person. Get help right away if: You feel like the person may hurt themselves or others. The person has talked about taking their own life. These symptoms may be an emergency. Take one of these steps right away: Go to your nearest emergency room. Call 911. Call the National Suicide Prevention Lifeline at 207-555-9811 or 988. Text the Crisis Text Line at (581)712-5100. This information is not intended to replace advice given to you by your health care provider. Make sure you discuss any questions you have with your health care provider.  Safety:   The following safety precautions should be taken:   No sharp objects. This includes scissors, razors, scrapers, and putty knives.   Chemicals should be removed and locked up.   Medications should be removed and locked up.   Weapons should be removed and locked up. This includes firearms, knives and instruments that can be used to cause injury.   The patient should abstain from use of illicit substances/drugs and abuse of any medications.  If symptoms worsen or do not continue to improve or if the patient becomes actively suicidal or homicidal then it is recommended that the patient return to the closest hospital emergency department, the [facility], or call 911 for further evaluation and treatment. National Suicide Prevention Lifeline 1-800-SUICIDE or 731-437-6423.  About 988 988 offers  24/7 access to trained crisis counselors who can help people experiencing mental health-related distress. People can call or text 988 or chat 988lifeline.org for themselves or if they are worried about a loved one who may need crisis support.

## 2024-03-20 NOTE — ED Provider Notes (Addendum)
 Behavioral Health Urgent Care Medical Screening Exam  Patient Name: Jacqueline Ball MRN: [id number] Date of Evaluation: 03/20/24 Chief Complaint:  IVC Diagnosis:  Final diagnoses:  Involuntary commitment  Cognitive impairment    History of Present illness: Jacqueline Ball is a 83 y.o. female patient with no past psychiatric history and a history of cognitive impairment and late onset alzheimer, A-fib, HTN and DM who presented to the [facility] under IVC, petitioned by her son Deneane Stifter [phone].  Per IVC, Respondent has been previously diagnosed with dementia and diabetes. She is compliant with her medication regimen. Family states that respondent has been wandering away from home multiple times recently, [facility] found her and brought her home after neighbors saw her walking aimlessly miles from home. She also has been hallucinating believing her son is her husband. She also has been threatening her son who resides with her, saying she was going to stab him in his sleep and burn the house down. Family is greatly concerned for her well-being as she continues to regress mentally and wander away from home.   Patient seen and evaluated face to face by this provider and chart reviewed. Per chart review, patient was diagnosed with a UTI on 03/15/24 and was prescribed cephalexin 500 mg po TID. She also has a documented history of cognitive impairment and late onset alzheimer.   On evaluation, patient is alert and oriented to name, date of birth, month, and president. However, she did appear to be confused as to how she arrived to the [facility] today and stated that her son brought her here. She does acknowledge that she was picked up by the police today as she expresses that her son would be embarrassed because the police came to get her from her from his house. She has no insight as to why she was picked up  by the police and placed under involuntary commitment. She denies the accusations pertaining to the involuntary commitment. She states that she hasn't threatened to hurt anyone and wouldn't even hurt herself. She states that she would never do anything to hurt her son and that he is real nice and takes care of her. She denies hearing voices or seeing things that other people cannot hear or see. She denies feeling paranoid like someone is out to get her. She reports pretty good sleep. She reports a good appetite and states that she is hungry. She is noted to be eating crackers during the exam. She describes her mood as depressed when her husband died in 01/20/24 of this year. She states that singing helps her to not be depressed. She reports that she was married for 60 years. Patient denies wandering the streets. She reports feeling safe to return home today. She denies a past psychiatric history.   I spoke to the patient's son Charlese Gruetzmacher via telephone. Mr. Kerrick confirms that the patient does reside with him. He states that the patient thinks that he is her husband. He states that the patient was recently taken to [facility] and had a UTI and was told that the patient has dementia. He states that he has not picked up the patient's antibiotic from the pharmacy. He states that the patient has been confused for a while and has probably had dementia for the past 2 years. He confirms that his father passed away in 01/20/2024 of this year. He states that he is mostly concerned because  his mother has been walking off from the house and when the police brought her home today they stated that she appeared confused.  Plan of care: I dicussed with Mr. Shadowens that the patient does not meet criteria for [facility] IVC commitment and inpatient psychiatric treatment is not recommended for patients with history of dementia. He was encouraged to pick up the patient's antibiotic from the pharmacy to treat UTI as UTIs  can worsen memory and cause psychosis in the elderly. Furthermore, it is recommended that the patient follow up with Neurology for neurocognitive impairment treatment. If symptoms of dementia progress, may need to considering memory care placement. Safety planning competed, locks or alarms on the door to prevent wandering, removed and lock up all weapons, sharps, medications and hazardous chemicals in the home.   Rescind IVC After thorough evaluation and review of information currently presented on assessment of Jacqueline Ball (respondent), there is insufficient findings to indicate respondent meets criteria for involuntary commitment or require an inpatient level of care.  Respondent is alert/oriented x 3, calm, cooperative, and mood congruent with affect. Respondent is speaking in a clear tone at moderate volume, and normal pace, with good eye contact. Respondents' thought process is coherent, relevant, and there is no indication that the respondent is currently responding to internal/external stimuli or experiencing delusional thought content.  Respondent denies suicidal/self-harm/homicidal ideation, psychosis, and paranoia. Respondent has remained calm and cooperative throughout assessment and responded to questions appropriately. Currently respondent is not significantly impaired, psychotic, or manic on exam. There is no evidence of imminent risk to self or others at present and respondent does not meet criteria for psychiatric inpatient admission.   Flowsheet Row ED from 03/20/2024 in [facility] ED from 03/16/2024 in [facility] Emergency Department at [facility] ED from 03/15/2024 in [facility] Emergency Department at [facility]  C-SSRS RISK CATEGORY No Risk No Risk No Risk       Psychiatric Specialty Exam  Presentation  General Appearance:Appropriate for Environment  Eye Contact:Fair  Speech:Clear and Coherent  Speech  Volume:Normal  Handedness:Right   Mood and Affect  Mood: Euthymic  Affect: Congruent   Thought Process  Thought Processes: Coherent  Descriptions of Associations:Intact  Orientation:Full (Time, Place and Person)  Thought Content:Logical    Hallucinations:None  Ideas of Reference:None  Suicidal Thoughts:No  Homicidal Thoughts:No   Sensorium  Memory: Immediate Fair  Judgment: Intact  Insight: Present   Executive Functions  Concentration: Fair  Attention Span: Fair  Recall: [employer] of Knowledge: Fair  Language: Fair   Psychomotor Activity  Psychomotor Activity: Normal   Assets  Assets: Manufacturing systems engineer; Financial Resources/Insurance; Desire for Improvement; Housing; Social Support   Sleep  Sleep: Fair   Physical Exam: Physical Exam Cardiovascular:     Rate and Rhythm: Normal rate.  Pulmonary:     Effort: Pulmonary effort is normal.  Musculoskeletal:        General: Normal range of motion.  Neurological:     Mental Status: She is alert and oriented to person, place, and time.    Review of Systems  Constitutional: Negative.   Eyes: Negative.   Respiratory: Negative.    Cardiovascular: Negative.   Gastrointestinal: Negative.   Genitourinary: Negative.        UTI on 3/30  Musculoskeletal: Negative.   Neurological:        History of cognitive impairment   Endo/Heme/Allergies: Negative.    Blood pressure 120/82, pulse 72,  temperature 98.3 F (36.8 C), temperature source Oral, resp. rate 16, SpO2 100%. There is no height or weight on file to calculate BMI.  Musculoskeletal: Strength & Muscle Tone: within normal limits Gait & Station: normal Patient leans: N/A   [facility] MSE Discharge Disposition for Follow up and Recommendations: Based on my evaluation the patient does not appear to have an emergency medical condition and can be discharged with resources and follow up care in outpatient services for Neurology    Based on your evaluation today for cognitive impairment please follow up with the information below for neurology for outpatient services for neurocognitive disorders. Please contact one of the facilities below to establish new patient services. Treatment for neurocognitive disorders (NCDs), which include conditions like dementia and Alzheimer's disease, focuses on managing symptoms, improving quality of life, and slowing cognitive decline through a combination of medications, therapies, and supportive care.  [facility] Address: [address] [place], [place] [address] Phone: [phone]  [facility] Address: [address], [place], [place] [address] Phone: [phone]  [facility] Address: [address], [place], [place] [address] Phone: [phone]  Urinary Tract Infection: You were diagnosed with a UTI on March 15, 2024 which may be worsening psychiatric symptoms (hallucinations). Please pick up your medication cephalexin (Keflex) 500 mg 3 times a day from the pharmacy to treat urinary tract symptoms.  Dementia Caregiver Guide Dementia is a condition that affects the way the brain works. It often affects thinking and memory. A person with dementia may: Forget things. Have trouble talking or responding to your questions. Have trouble paying attention. Have trouble thinking clearly and making good decisions. Get lost or wander away from home or other places. Have big changes in their mood or emotions. They may: Feel very worried, nervous, or depressed. Have angry outbursts. Be suspicious or accuse you of things. Have childlike behavior and language. Taking care of someone with dementia can be a challenge. The tips below can help you care for the person. How to help manage lifestyle changes Dementia usually gets worse slowly over time. In the early stages, people with  dementia can stay safe and take care of themselves with some help. In later stages, they need help with daily tasks like getting dressed, grooming, and going to the bathroom. Communicating When the person is talking and seems frustrated, make eye contact and hold the person's hand. Ask questions that can be answered with a yes or no. Use simple words and a calm voice. Only give one direction at a time. Limit choices for the person. Too many choices can be stressful. Avoid correcting the person in a negative way. If the person can't find the right words, gently try to help. Preventing injury  Keep floors clear. Remove rugs, magazine racks, and floor lamps. Keep hallways well lit, especially at night. Put a handrail and nonslip mat in the bathtub or shower. Put childproof locks on cabinets that have dangerous items in them. These items include medicine, alcohol, guns, cleaning products, and sharp tools. For doors to the outside, put locks where the person can't see or reach them. This helps keep the person from going out of the house and getting lost. Be ready for emergencies. Keep a list of emergency phone numbers and addresses close by. Remove car keys and lock garage doors so the person doesn't try to drive. Have the person wear a bracelet that  tracks where they are and shows that they're a person with memory loss. This should be worn at all times for safety. Helping with daily life  Keep the person on track with their daily routine. Try to identify areas where the person may need help. Be supportive, patient, calm, and encouraging. Gently remind the person that adjusting to changes takes time. Help with the tasks that the person has asked for help with. Keep the person involved in daily tasks and decisions as much as you can. Encourage conversation, but try not to get frustrated if the person struggles to find words or doesn't seem to appreciate your help. Other tips Think about any  safety risks and take steps to avoid them. Keep things organized: Organize medicines in a pill box for each day of the week. Keep a calendar in a central place. Use it to remind the person of health care visits or other activities. Create a plan to handle any legal or financial matters. Get help from a professional if needed. Help make sure the person: Takes medicines only as told by their health care providers. Eats regular, healthy meals. They should also drink plenty of fluids. Goes to all scheduled health care appointments. Gets regular sleep. Taking care of yourself Being a caregiver for someone with dementia can be hard. You may feel stressed and have many other emotions. It's important to also take care of yourself. Here are some tips: Find out about services that can provide short-term care for the person. This is called respite care. It can allow you to take a break when you need one. Find healthy ways to deal with stress. Some ways include: Spending time with other people. Exercising. Meditating or doing deep breathing exercises. Take care of your own health by: Getting enough sleep. Eating healthy foods. Getting regular exercise. Join a support group with others who are caregivers. These groups can help you: Learn other ways to deal with stress. Share experiences with others. Get emotional comfort and support. Learn about caregiving as the disease gets worse. Find resources in your community. Where to find support: Many people and organizations offer support. These include: Support groups for people with dementia. Support groups for caregivers. Counselors or therapists. Home health care services. Adult day care centers. Where to find more information Alzheimer's Association: WesternTunes.it Family Caregiver Alliance: caregiver.org Alzheimer's Foundation of [place]: alzfdn.org Contact a health care provider if: The person's health is quickly getting worse. You're no longer  able to care for the person. Caring for the person is affecting your physical and emotional health. You're feeling worried, nervous, or depressed about caring for the person. Get help right away if: You feel like the person may hurt themselves or others. The person has talked about taking their own life. These symptoms may be an emergency. Take one of these steps right away: Go to your nearest emergency room. Call 911. Call the National Suicide Prevention Lifeline at (856)535-3221 or 988. Text the Crisis Text Line at 951-113-3137. This information is not intended to replace advice given to you by your health care provider. Make sure you discuss any questions you have with your health care provider.  Safety:   The following safety precautions should be taken:   No sharp objects. This includes scissors, razors, scrapers, and putty knives.   Chemicals should be removed and locked up.   Medications should be removed and locked up.   Weapons should be removed and locked up. This includes firearms, knives and instruments  that can be used to cause injury.   The patient should abstain from use of illicit substances/drugs and abuse of any medications.  If symptoms worsen or do not continue to improve or if the patient becomes actively suicidal or homicidal then it is recommended that the patient return to the closest hospital emergency department, the [facility], or call 911 for further evaluation and treatment. National Suicide Prevention Lifeline 1-800-SUICIDE or 585-371-1220.  About 988 988 offers 24/7 access to trained crisis counselors who can help people experiencing mental health-related distress. People can call or text 988 or chat 988lifeline.org for themselves or if they are worried about a loved one who may need crisis support.    Layla Barter, NP 03/20/2024, 12:25 PM

## 2024-03-25 ENCOUNTER — Encounter: Payer: Self-pay | Admitting: Physician Assistant

## 2024-04-22 ENCOUNTER — Encounter: Payer: Self-pay | Admitting: Physician Assistant

## 2024-04-22 ENCOUNTER — Ambulatory Visit

## 2024-04-22 ENCOUNTER — Other Ambulatory Visit

## 2024-04-22 ENCOUNTER — Ambulatory Visit (INDEPENDENT_AMBULATORY_CARE_PROVIDER_SITE_OTHER): Admitting: Physician Assistant

## 2024-04-22 VITALS — BP 149/77 | HR 87 | Resp 20 | Ht 65.0 in | Wt 120.0 lb

## 2024-04-22 DIAGNOSIS — F0280 Dementia in other diseases classified elsewhere without behavioral disturbance: Secondary | ICD-10-CM

## 2024-04-22 DIAGNOSIS — R413 Other amnesia: Secondary | ICD-10-CM | POA: Diagnosis not present

## 2024-04-22 DIAGNOSIS — G309 Alzheimer's disease, unspecified: Secondary | ICD-10-CM

## 2024-04-22 LAB — TSH: TSH: 2.68 m[IU]/L (ref 0.40–4.50)

## 2024-04-22 LAB — VITAMIN B12: Vitamin B-12: 896 pg/mL (ref 200–1100)

## 2024-04-22 MED ORDER — MEMANTINE HCL 10 MG PO TABS: ORAL_TABLET | ORAL | 3 refills | Status: AC

## 2024-04-22 NOTE — Patient Instructions (Addendum)
 It was a pleasure to see you today at our office.   Recommendations:   Check labs today  [address] Restart Memantine  10 mg twice a day as directed  Continue donepezil  10 mg daily Follow up in   6months Recommend visiting the website : " [facility]ementia [facility]" to better understand some behaviors related to memory loss.    For psychiatric meds, mood meds: Please have your primary care physician manage these medications.  If you have any severe symptoms of a stroke, or other severe issues such as confusion,severe chills or fever, etc call 911 or go to the ER as you may need to be evaluated further   For guidance regarding [facility] and if placement were needed at the facility, contact Social Worker tel: [phone]  For assessment of decision of mental capacity and competency:  Call Dr. Laverne Potter, geriatric psychiatrist at [phone]  Counseling regarding caregiver distress, including caregiver depression, anxiety and issues regarding community resources, adult day care programs, adult living facilities, or memory care questions:  please contact your  Primary Doctor's Social Worker   Whom to call: Memory  decline, memory medications: Call our office [phone]    [email or web address]   RECOMMENDATIONS FOR ALL PATIENTS WITH MEMORY PROBLEMS: 1. Continue to exercise (Recommend 30 minutes of walking everyday, or 3 hours every week) 2. Increase social interactions - continue going to [place] and enjoy social gatherings with friends and family 3. Eat healthy, avoid fried foods and eat more fruits and vegetables 4. Maintain adequate blood pressure, blood sugar, and blood cholesterol level. Reducing the risk of stroke and cardiovascular disease also helps promoting better memory. 5. Avoid stressful situations. Live a simple life and avoid aggravations. Organize your time and prepare for the next day in  anticipation. 6. Sleep well, avoid any interruptions of sleep and avoid any distractions in the bedroom that may interfere with adequate sleep quality 7. Avoid sugar, avoid sweets as there is a strong link between excessive sugar intake, diabetes, and cognitive impairment We discussed the Mediterranean diet, which has been shown to help patients reduce the risk of progressive memory disorders and reduces cardiovascular risk. This includes eating fish, eat fruits and green leafy vegetables, nuts like almonds and hazelnuts, walnuts, and also use olive oil. Avoid fast foods and fried foods as much as possible. Avoid sweets and sugar as sugar use has been linked to worsening of memory function.  There is always a concern of gradual progression of memory problems. If this is the case, then we may need to adjust level of care according to patient needs. Support, both to the patient and caregiver, should then be put into place.          FALL PRECAUTIONS: Be cautious when walking. Scan the area for obstacles that may increase the risk of trips and falls. When getting up in the mornings, sit up at the edge of the bed for a few minutes before getting out of bed. Consider elevating the bed at the head end to avoid drop of blood pressure when getting up. Walk always in a well-lit room (use night lights in the walls). Avoid area rugs or power cords from appliances in the middle of the walkways. Use a walker or a cane if necessary and consider physical therapy for balance exercise. Get your eyesight checked regularly.  FINANCIAL OVERSIGHT: Supervision, especially oversight when making financial decisions or transactions is also recommended.  HOME SAFETY: Consider the safety of the kitchen  when operating appliances like stoves, microwave oven, and blender. Consider having supervision and share cooking responsibilities until no longer able to participate in those. Accidents with firearms and other hazards in the  house should be identified and addressed as well.   ABILITY TO BE LEFT ALONE: If patient is unable to contact 911 operator, consider using LifeLine, or when the need is there, arrange for someone to stay with patients. Smoking is a fire hazard, consider supervision or cessation. Risk of wandering should be assessed by caregiver and if detected at any point, supervision and safe proof recommendations should be instituted.  MEDICATION SUPERVISION: Inability to self-administer medication needs to be constantly addressed. Implement a mechanism to ensure safe administration of the medications.      Mediterranean Diet A Mediterranean diet refers to food and lifestyle choices that are based on the traditions of countries located on the [employer]. This way of eating has been shown to help prevent certain conditions and improve outcomes for people who have chronic diseases, like kidney disease and heart disease. What are tips for following this plan? Lifestyle  Cook and eat meals together with your family, when possible. Drink enough fluid to keep your urine clear or pale yellow. Be physically active every day. This includes: Aerobic exercise like running or swimming. Leisure activities like gardening, walking, or housework. Get 7-8 hours of sleep each night. If recommended by your health care provider, drink red wine in moderation. This means 1 glass a day for nonpregnant women and 2 glasses a day for men. A glass of wine equals 5 oz (150 mL). Reading food labels  Check the serving size of packaged foods. For foods such as rice and pasta, the serving size refers to the amount of cooked product, not dry. Check the total fat in packaged foods. Avoid foods that have saturated fat or trans fats. Check the ingredients list for added sugars, such as corn syrup. Shopping  At the grocery store, buy most of your food from the areas near the walls of the store. This includes: Fresh fruits and  vegetables (produce). Grains, beans, nuts, and seeds. Some of these may be available in unpackaged forms or large amounts (in bulk). Fresh seafood. Poultry and eggs. Low-fat dairy products. Buy whole ingredients instead of prepackaged foods. Buy fresh fruits and vegetables in-season from local farmers markets. Buy frozen fruits and vegetables in resealable bags. If you do not have access to quality fresh seafood, buy precooked frozen shrimp or canned fish, such as tuna, salmon, or sardines. Buy small amounts of raw or cooked vegetables, salads, or olives from the deli or salad bar at your store. Stock your pantry so you always have certain foods on hand, such as olive oil, canned tuna, canned tomatoes, rice, pasta, and beans. Cooking  Cook foods with extra-virgin olive oil instead of using butter or other vegetable oils. Have meat as a side dish, and have vegetables or grains as your main dish. This means having meat in small portions or adding small amounts of meat to foods like pasta or stew. Use beans or vegetables instead of meat in common dishes like chili or lasagna. Experiment with different cooking methods. Try roasting or broiling vegetables instead of steaming or sauteing them. Add frozen vegetables to soups, stews, pasta, or rice. Add nuts or seeds for added healthy fat at each meal. You can add these to yogurt, salads, or vegetable dishes. Marinate fish or vegetables using olive oil, lemon juice, garlic, and fresh  herbs. Meal planning  Plan to eat 1 vegetarian meal one day each week. Try to work up to 2 vegetarian meals, if possible. Eat seafood 2 or more times a week. Have healthy snacks readily available, such as: Vegetable sticks with hummus. Greek yogurt. Fruit and nut trail mix. Eat balanced meals throughout the week. This includes: Fruit: 2-3 servings a day Vegetables: 4-5 servings a day Low-fat dairy: 2 servings a day Fish, poultry, or lean meat: 1 serving a  day Beans and legumes: 2 or more servings a week Nuts and seeds: 1-2 servings a day Whole grains: 6-8 servings a day Extra-virgin olive oil: 3-4 servings a day Limit red meat and sweets to only a few servings a month What are my food choices? Mediterranean diet Recommended Grains: Whole-grain pasta. Brown rice. Bulgar wheat. Polenta. Couscous. Whole-wheat bread. Dwyane Glad. Vegetables: Artichokes. Beets. Broccoli. Cabbage. Carrots. Eggplant. Green beans. Chard. Kale. Spinach. Onions. Leeks. Peas. Squash. Tomatoes. Peppers. Radishes. Fruits: Apples. Apricots. Avocado. Berries. Bananas. Cherries. Dates. Figs. Grapes. Lemons. Melon. Oranges. Peaches. Plums. Pomegranate. Meats and other protein foods: Beans. Almonds. Sunflower seeds. Pine nuts. Peanuts. Cod. Salmon. Scallops. Shrimp. Tuna. Tilapia. Clams. Oysters. Eggs. Dairy: Low-fat milk. Cheese. Greek yogurt. Beverages: Water . Red wine. Herbal tea. Fats and oils: Extra virgin olive oil. Avocado oil. Grape seed oil. Sweets and desserts: [place] yogurt with honey. Baked apples. Poached pears. Trail mix. Seasoning and other foods: Basil. Cilantro. Coriander. Cumin. Mint. Parsley. Sage. Rosemary. Tarragon. Garlic. Oregano. Thyme. Pepper. Balsalmic vinegar. Tahini. Hummus. Tomato sauce. Olives. Mushrooms. Limit these Grains: Prepackaged pasta or rice dishes. Prepackaged cereal with added sugar. Vegetables: Deep fried potatoes (french fries). Fruits: Fruit canned in syrup. Meats and other protein foods: Beef. Pork. Lamb. Poultry with skin. Hot dogs. Helene Loader. Dairy: Ice cream. Sour cream. Whole milk. Beverages: Juice. Sugar-sweetened soft drinks. Beer. Liquor and spirits. Fats and oils: Butter. Canola oil. Vegetable oil. Beef fat (tallow). Lard. Sweets and desserts: Cookies. Cakes. Pies. Candy. Seasoning and other foods: Mayonnaise. Premade sauces and marinades. The items listed may not be a complete list. Talk with your dietitian about what  dietary choices are right for you. Summary The Mediterranean diet includes both food and lifestyle choices. Eat a variety of fresh fruits and vegetables, beans, nuts, seeds, and whole grains. Limit the amount of red meat and sweets that you eat. Talk with your health care provider about whether it is safe for you to drink red wine in moderation. This means 1 glass a day for nonpregnant women and 2 glasses a day for men. A glass of wine equals 5 oz (150 mL). This information is not intended to replace advice given to you by your health care provider. Make sure you discuss any questions you have with your health care provider. Document Released: 07/26/2016 Document Revised: 08/28/2016 Document Reviewed: 07/26/2016 Elsevier Interactive Patient Education  2017 [employer].

## 2024-04-22 NOTE — Progress Notes (Addendum)
 Assessment/Plan:     Jacqueline Ball is an 83 y.o. year old LH female with a history of hypertension, hyperlipidemia, diabetes, CHF, GERD, CHF, MDD arthritis, OSA and a prior diagnosis of late onset Alzheimer's disease.  This is dating back to 2016 when she was seen at [facility] until 2020, when she lost to follow up in the wake of Covid. She was on donepezil  10 mg daily and memantine  10 mg bid (when she ran out of it she stopped taking it). She is seen today for evaluation of memory loss. She has significant short-term memory loss. Mood is good.   Patient  is independent with her ADLS Patient no longer drives.  Dementia due to Alzheimer's disease     Check B12, TSH Patient is on donepezil  10 mg daily per PCP, continue therapy, side effects discussed Resume memantine  10 mg bid, side effects discussed Recommend close monitoring as she has wandering tendencies.  Recommend good control of cardiovascular risk factors.  Continue Eliquis  Continue to control mood as per  PCP Fol[place]ow up in  6 months  Subjective:    The patient is accompanied by her son who supplements the history.    How long did patient have memory difficulties?  For the last 9 or 10 years, she has been seen at [facility] in 2020, and currently diagnosis of late onset of Alzheimer disease although she does not remember having seen any neurologist.  Per chart report she says she "only  had 1 episode of Alzheimer's ".  Patient has difficulty remembering new information, recent conversations and names of people. She hoards napkins, toothbrush, toothpaste repeats oneself?  Endorsed, all the time, multiple questions. Disoriented when walking into a room?  Denies except occasionally not remembering what patient came to the room for    Leaving objects in unusual places? Endorsed, she places things "all over the place"  Wandering behavior? Endorsed , 04/16/23 she left the house without notice, came back with doughnuts. Any personality  changes, or depression, anxiety?  She has a history of MDD. never seen psychiatry  Hallucinations or paranoia?  Denies.   Seizures? Denies.    Any sleep changes?  Sleeps well, I used to dream a lot, occasionally now. REM behavior or sleepwalking.   Sleep apnea? Denies.   Any hygiene concerns?  Denies.   Independent of bathing and dressing?  Endorsed  Who is in charge of the medications?Son is in charge   Who is in charge of the finances?  Son is in charge     Any changes in appetite?  Eats well.  Patient have trouble swallowing?  Denies.   Does the patient cook?  No after leaving stove on   Any headaches?  Denies.   Chronic back pain?  Denies.   Ambulates with difficulty? Needs a walker to ambulate    Recent falls or head injuries? Denies.     Vision changes? Denies. Stroke like symptoms?  Denies.   Any tremors?  Denies.   Any anosmia?  Denies.   Any incontinence of urine? Denies.   Any bowel dysfunction? Denies.      Patient lives  with her son since 01-05-24 after the death of her husband History of heavy alcohol intake? Denies.   History of heavy tobacco use? Denies.   Family history of dementia?   Mother had dementia  Does patient drive? No longer drives (she says she drive but she does not).  Allergies  Allergen Reactions   Metoprolol   Nausea And Vomiting    Current Outpatient Medications  Medication Instructions   acetaminophen  (TYLENOL ) 1,000 mg, Every 6 hours PRN   apixaban  (ELIQUIS ) 2.5 mg, 2 times daily   atorvastatin  (LIPITOR) 20 mg, Daily   cephALEXin  (KEFLEX ) 500 mg, Oral, 3 times daily   diclofenac  Sodium (VOLTAREN ) 2 g, Topical, 4 times daily   donepezil  (ARICEPT ) 10 mg, Daily at bedtime   loperamide  (IMODIUM ) 2 mg, Oral, 3 times daily PRN   memantine  (NAMENDA ) 10 MG tablet Take 1 tablet (10 mg at night) for 2 weeks, then increase to 1 tablet (10 mg) twice a day   METFORMIN  HCL PO 750 mg, Daily   pregabalin (LYRICA) 100 MG capsule Take by mouth.      VITALS:   Vitals:   04/22/24 0747  BP: (!) 149/77  Pulse: 87  Resp: 20  SpO2: 99%  Weight: 120 lb (54.4 kg)  Height: 5\' 5"  (1.651 m)      PHYSICAL EXAM   HEENT:  Normocephalic, atraumatic. The mucous membranes are moist. The superficial temporal arteries are without ropiness or tenderness. Cardiovascular: IRRR. Lungs: Clear to auscultation bilaterally. Neck: There are no carotid bruits noted bilaterally.  NEUROLOGICAL:     No data to display             04/22/2024    8:00 AM 12/26/2018   10:47 AM 03/18/2018    9:46 AM  MMSE - Mini Mental State Exam  Not completed:  --   Orientation to time 2 4 5   Orientation to Place 3 4 4   Registration 3 3 3   Attention/ Calculation 5 5 0  Attention/Calculation-comments  patient was able to do the math portion   Recall 0 0 1  Language- name 2 objects 2 2 2   Language- repeat 1 1 1   Language- follow 3 step command 2 1 3   Language- follow 3 step command-comments  she took the paper with her left hand, folded it and held it in the air   Language- read & follow direction 1 1 1   Write a sentence 1 1 1   Copy design 0 1 1  Total score 20 23 22      Orientation:  Alert and oriented to person,  to place and  not to time . No aphasia or dysarthria. Fund of knowledge is reduced. Recent and remote memory impaired.  Attention and concentration are reduced.  Able to name objects and unable to repeat phrases. Delayed recall  0/3 Cranial nerves: There is good facial symmetry. Extraocular muscles are intact and visual fields are full to confrontational testing. Speech is fluent and clear, tangential , no tongue deviation. Hearing is intact to conversational tone.  Tone: Tone is good throughout. Sensation: Sensation is intact to light touch and pinprick throughout. Vibration is intact at the bilateral big toe. Coordination: The patient has no difficulty with RAM's or FNF bilaterally. Normal finger to nose  Motor: Strength is 5/5 in the bilateral  upper and lower extremities. There is no pronator drift. There are no fasciculations noted. DTR's: Deep tendon reflexes are 2/4 .  Plantar responses are downgoing bilaterally. Gait and Station: The patient is able to ambulate without difficulty  Gait is cautious and narrow.      Thank you for allowing us  the opportunity to participate in the care of this nice patient. Please do not hesitate to contact us  for any questions or concerns.   Total time spent on today's visit was 60 minutes dedicated to  this patient today, preparing to see patient, examining the patient, ordering tests and/or medications and counseling the patient, documenting clinical information in the EHR or other health record, independently interpreting results and communicating results to the patient/family, discussing treatment and goals, answering patient's questions and coordinating care.  Cc:  Lorella Roles, MD  Tex Filbert 04/22/2024 8:49 AM

## 2024-04-22 NOTE — Progress Notes (Signed)
 B12 and Thyroid  levels are normal.Thanks

## 2024-05-05 ENCOUNTER — Other Ambulatory Visit: Payer: Self-pay | Admitting: Nurse Practitioner

## 2024-05-05 ENCOUNTER — Ambulatory Visit
Admission: RE | Admit: 2024-05-05 | Discharge: 2024-05-05 | Disposition: A | Discharge status: Home / Self Care | Source: Ambulatory Visit | Attending: Nurse Practitioner | Admitting: Nurse Practitioner

## 2024-05-05 ENCOUNTER — Emergency Department (HOSPITAL_COMMUNITY)
Admission: EM | Admit: 2024-05-05 | Discharge: 2024-05-06 | Disposition: A | Discharge status: Home / Self Care | Attending: Emergency Medicine | Admitting: Emergency Medicine

## 2024-05-05 ENCOUNTER — Other Ambulatory Visit: Payer: Self-pay

## 2024-05-05 DIAGNOSIS — Z79899 Other long term (current) drug therapy: Secondary | ICD-10-CM | POA: Insufficient documentation

## 2024-05-05 DIAGNOSIS — R3915 Urgency of urination: Secondary | ICD-10-CM | POA: Insufficient documentation

## 2024-05-05 DIAGNOSIS — M25572 Pain in left ankle and joints of left foot: Secondary | ICD-10-CM

## 2024-05-05 DIAGNOSIS — R300 Dysuria: Secondary | ICD-10-CM | POA: Insufficient documentation

## 2024-05-05 DIAGNOSIS — E119 Type 2 diabetes mellitus without complications: Secondary | ICD-10-CM | POA: Insufficient documentation

## 2024-05-05 DIAGNOSIS — I10 Essential (primary) hypertension: Secondary | ICD-10-CM | POA: Diagnosis not present

## 2024-05-05 DIAGNOSIS — R319 Hematuria, unspecified: Secondary | ICD-10-CM | POA: Diagnosis not present

## 2024-05-05 DIAGNOSIS — D649 Anemia, unspecified: Secondary | ICD-10-CM | POA: Insufficient documentation

## 2024-05-05 DIAGNOSIS — R748 Abnormal levels of other serum enzymes: Secondary | ICD-10-CM | POA: Diagnosis not present

## 2024-05-05 DIAGNOSIS — Z7984 Long term (current) use of oral hypoglycemic drugs: Secondary | ICD-10-CM | POA: Diagnosis not present

## 2024-05-05 DIAGNOSIS — R350 Frequency of micturition: Secondary | ICD-10-CM | POA: Diagnosis not present

## 2024-05-05 DIAGNOSIS — Z87440 Personal history of urinary (tract) infections: Secondary | ICD-10-CM | POA: Diagnosis not present

## 2024-05-05 DIAGNOSIS — Z7901 Long term (current) use of anticoagulants: Secondary | ICD-10-CM | POA: Insufficient documentation

## 2024-05-05 LAB — COMPREHENSIVE METABOLIC PANEL WITH GFR
ALT: 18 U/L (ref 0–44)
AST: 19 U/L (ref 15–41)
Albumin: 3.7 g/dL (ref 3.5–5.0)
Alkaline Phosphatase: 87 U/L (ref 38–126)
Anion gap: 12 (ref 5–15)
BUN: 12 mg/dL (ref 8–23)
CO2: 21 mmol/L — ABNORMAL LOW (ref 22–32)
Calcium: 9.9 mg/dL (ref 8.9–10.3)
Chloride: 105 mmol/L (ref 98–111)
Creatinine, Ser: 0.81 mg/dL (ref 0.44–1.00)
GFR, Estimated: >60 mL/min (ref >60)
Glucose, Bld: 318 mg/dL — ABNORMAL HIGH (ref 70–99)
Potassium: 3.7 mmol/L (ref 3.5–5.1)
Sodium: 138 mmol/L (ref 135–145)
Total Bilirubin: 0.7 mg/dL (ref 0.0–1.2)
Total Protein: 6.8 g/dL (ref 6.5–8.1)

## 2024-05-05 LAB — CBC WITH DIFFERENTIAL/PLATELET
Abs Immature Granulocytes: 0.02 10*3/uL (ref 0.00–0.07)
Basophils Absolute: 0 10*3/uL (ref 0.0–0.1)
Basophils Relative: 0 %
Eosinophils Absolute: 0 10*3/uL (ref 0.0–0.5)
Eosinophils Relative: 0 %
HCT: 35 % — ABNORMAL LOW (ref 36.0–46.0)
Hemoglobin: 11.5 g/dL — ABNORMAL LOW (ref 12.0–15.0)
Immature Granulocytes: 0 %
Lymphocytes Relative: 26 %
Lymphs Abs: 2 10*3/uL (ref 0.7–4.0)
MCH: 31.3 pg (ref 26.0–34.0)
MCHC: 32.9 g/dL (ref 30.0–36.0)
MCV: 95.4 fL (ref 80.0–100.0)
Monocytes Absolute: 0.7 10*3/uL (ref 0.1–1.0)
Monocytes Relative: 10 %
Neutro Abs: 4.9 10*3/uL (ref 1.7–7.7)
Neutrophils Relative %: 64 %
Platelets: 271 10*3/uL (ref 150–400)
RBC: 3.67 MIL/uL — ABNORMAL LOW (ref 3.87–5.11)
RDW: 13.1 % (ref 11.5–15.5)
WBC: 7.7 10*3/uL (ref 4.0–10.5)
nRBC: 0 % (ref 0.0–0.2)

## 2024-05-05 LAB — LIPASE, BLOOD: Lipase: 100 U/L — ABNORMAL HIGH (ref 11–51)

## 2024-05-05 NOTE — ED Triage Notes (Signed)
 Patient with hx of dementia arrives with son from [facility] for eval of hematuria. Patient's son states he originally took her to the doctor related to her limping because of ankle pain. Patient denies ankle pain but now endorses abdominal pain and burning with urination. Denies n/v. Given Keflex  by PCP for possible UTI.

## 2024-05-05 NOTE — ED Provider Triage Note (Signed)
 Emergency Medicine Provider Triage Evaluation Note  Jacqueline Ball , a 83 y.o. female  was evaluated in triage.  Pt complains of hematuria on blood thinner associated with suprapubic pain and dysuria.   Review of Systems  Positive: Abd pain, hematuria   Negative: Fever  Physical Exam  There were no vitals taken for this visit. Gen:   Awake, no distress   Resp:  Normal effort  MSK:   Moves extremities without difficulty  Other:    Medical Decision Making  Medically screening exam initiated at 6:27 PM.  Appropriate orders placed.  Karletta Millay was informed that the remainder of the evaluation will be completed by another provider, this initial triage assessment does not replace that evaluation, and the importance of remaining in the ED until their evaluation is complete.     Eudora Heron, PA-C 05/05/24 1610

## 2024-05-06 LAB — URINALYSIS, W/ REFLEX TO CULTURE (INFECTION SUSPECTED)
Bilirubin Urine: NEGATIVE
Glucose, UA: 50 mg/dL — AB
Ketones, ur: 5 mg/dL — AB
Leukocytes,Ua: NEGATIVE
Nitrite: NEGATIVE
Protein, ur: 100 mg/dL — AB
RBC / HPF: >50 RBC/hpf (ref 0–5)
Specific Gravity, Urine: 1.02 (ref 1.005–1.030)
pH: 5 (ref 5.0–8.0)

## 2024-05-06 NOTE — ED Notes (Signed)
 Pt was taken to bathroom, pt says she urinated, but dropped it

## 2024-05-06 NOTE — ED Provider Notes (Signed)
 [place] EMERGENCY DEPARTMENT AT [facility] Provider Note   CSN: [id number] Arrival date & time: 05/05/24  1804     History  Chief Complaint  Patient presents with   Hematuria    Jacqueline Ball is a 83 y.o. female.  The history is provided by the patient.  Hematuria  She has history of hypertension, diabetes, hyperlipidemia, atrial fibrillation anticoagulated on apixaban , dementia and comes in complaining of blood in her urine since yesterday.  There has been associated dysuria and urinary urgency and frequency.  She has also noted some lower abdominal pain but denies any flank pain.  She denies fever or chills and denies nausea or vomiting.   Home Medications Prior to Admission medications   Medication Sig Start Date End Date Taking? Authorizing Provider  acetaminophen  (TYLENOL ) 500 MG tablet Take 1,000 mg by mouth every 6 (six) hours as needed for pain.    [provider]  apixaban  (ELIQUIS ) 2.5 MG TABS tablet Take 2.5 mg by mouth 2 (two) times daily.    [provider]  atorvastatin  (LIPITOR) 20 MG tablet Take 20 mg by mouth daily.    [provider]  donepezil  (ARICEPT ) 5 MG tablet Take 10 mg by mouth at bedtime. 01/02/24   [provider]  memantine  (NAMENDA ) 10 MG tablet Take 1 tablet (10 mg at night) for 2 weeks, then increase to 1 tablet (10 mg) twice a day 04/22/24   Wertman, Sara E, PA-C  METFORMIN  HCL PO Take 750 mg by mouth daily.    [provider]  pregabalin (LYRICA) 100 MG capsule Take by mouth. 03/31/24   [provider]      Allergies    Metoprolol     Review of Systems   Review of Systems  Genitourinary:  Positive for hematuria.  All other systems reviewed and are negative.   Physical Exam Updated Vital Signs BP 128/77 (BP Location: Right Arm)   Pulse 61   Temp 98.4 F (36.9 C) (Oral)   Resp 14   SpO2 100%  Physical Exam Vitals and nursing note reviewed.   83 year old female,  resting comfortably and in no acute distress. Vital signs are normal. Oxygen saturation is 100%, which is normal. Head is normocephalic and atraumatic. PERRLA, EOMI.  Back is nontender and there is no CVA tenderness. Lungs are clear without rales, wheezes, or rhonchi. Chest is nontender. Heart has regular rate and rhythm without murmur. Abdomen is soft, flat, nontender. Neurologic: Awake and alert, moves all extremities equally.  ED Results / Procedures / Treatments   Labs (all labs ordered are listed, but only abnormal results are displayed) Labs Reviewed  COMPREHENSIVE METABOLIC PANEL WITH GFR - Abnormal; Notable for the following components:      Result Value   CO2 21 (*)    Glucose, Bld 318 (*)    All other components within normal limits  LIPASE, BLOOD - Abnormal; Notable for the following components:   Lipase 100 (*)    All other components within normal limits  CBC WITH DIFFERENTIAL/PLATELET - Abnormal; Notable for the following components:   RBC 3.67 (*)    Hemoglobin 11.5 (*)    HCT 35.0 (*)    All other components within normal limits  URINALYSIS, W/ REFLEX TO CULTURE (INFECTION SUSPECTED) - Abnormal; Notable for the following components:   Color, Urine AMBER (*)    APPearance CLOUDY (*)    Glucose, UA 50 (*)    Hgb urine dipstick  MODERATE (*)    Ketones, ur 5 (*)    Protein, ur 100 (*)    Bacteria, UA FEW (*)    All other components within normal limits    EKG None  Radiology No results found.  Procedures Procedures    Medications Ordered in ED Medications - No data to display  ED Course/ Medical Decision Making/ A&P                                 Medical Decision Making  Dysuria with urinary urgency and frequency and hematuria in a patient who is anticoagulated concerning for UTI.  Differential diagnosis also includes renal colic, interstitial cystitis, bladder calculi.  I have reviewed her laboratory tests, and my interpretation is elevated lipase  of doubtful clinical significance, otherwise unremarkable comprehensive metabolic panel, stable anemia, normal WBC and differential.  Urinalysis is pending.  I have reviewed her past records, and do note urinalysis showing UTI on 03/15/2024 treated with cephalexin .  Urine culture grew Escherichia coli and Klebsiella pneumoniae both of which were sensitive to cefazolin and should have been adequately treated with that course of antibiotics.  I have reviewed her urinalysis, and my interpretation is hematuria without evidence of UTI.  Her son states that she actually had been to an urgent care center prior to coming to the emergency department and had received a prescription for an antibiotic and had taken 1 dose.  It is possible that that could have hidden signs of a UTI which actually was present.  I am recommending that she continue the course of antibiotics until it is completely finished, I am referring her to urology for follow-up.  Final Clinical Impression(s) / ED Diagnoses Final diagnoses:  Hematuria, unspecified type  Chronic anticoagulation  Normochromic normocytic anemia  Elevated lipase    Rx / DC Orders ED Discharge Orders     None         Jacqueline April, MD 05/06/24 [phone]

## 2024-05-06 NOTE — Discharge Instructions (Addendum)
 Continue to keep the antibiotic which you were prescribed at the urgent care center.  Please follow-up with the urologist for further evaluation.  Return to the emergency department if symptoms are getting worse.  Specifically, if you start running a fever or start vomiting.

## 2024-05-07 DIAGNOSIS — R350 Frequency of micturition: Secondary | ICD-10-CM | POA: Diagnosis not present

## 2024-05-07 DIAGNOSIS — R3121 Asymptomatic microscopic hematuria: Secondary | ICD-10-CM | POA: Diagnosis not present

## 2024-05-07 DIAGNOSIS — R8271 Bacteriuria: Secondary | ICD-10-CM | POA: Diagnosis not present

## 2024-05-07 DIAGNOSIS — N3941 Urge incontinence: Secondary | ICD-10-CM | POA: Diagnosis not present

## 2024-06-02 DIAGNOSIS — N281 Cyst of kidney, acquired: Secondary | ICD-10-CM | POA: Diagnosis not present

## 2024-06-02 DIAGNOSIS — R3129 Other microscopic hematuria: Secondary | ICD-10-CM | POA: Diagnosis not present

## 2024-06-02 DIAGNOSIS — R3121 Asymptomatic microscopic hematuria: Secondary | ICD-10-CM | POA: Diagnosis not present

## 2024-06-02 DIAGNOSIS — K5730 Diverticulosis of large intestine without perforation or abscess without bleeding: Secondary | ICD-10-CM | POA: Diagnosis not present

## 2024-06-02 DIAGNOSIS — N289 Disorder of kidney and ureter, unspecified: Secondary | ICD-10-CM | POA: Diagnosis not present

## 2024-06-16 DIAGNOSIS — N3941 Urge incontinence: Secondary | ICD-10-CM | POA: Diagnosis not present

## 2024-06-16 DIAGNOSIS — R3121 Asymptomatic microscopic hematuria: Secondary | ICD-10-CM | POA: Diagnosis not present

## 2024-07-03 ENCOUNTER — Emergency Department (HOSPITAL_COMMUNITY)

## 2024-07-03 ENCOUNTER — Encounter (HOSPITAL_COMMUNITY): Payer: Self-pay | Admitting: *Deleted

## 2024-07-03 ENCOUNTER — Inpatient Hospital Stay (HOSPITAL_COMMUNITY)
Admission: EM | Admit: 2024-07-03 | Discharge: 2024-07-07 | DRG: 690 | Disposition: A | Discharge status: Home / Self Care | Attending: Internal Medicine | Admitting: Internal Medicine

## 2024-07-03 ENCOUNTER — Other Ambulatory Visit: Payer: Self-pay

## 2024-07-03 DIAGNOSIS — Z888 Allergy status to other drugs, medicaments and biological substances status: Secondary | ICD-10-CM

## 2024-07-03 DIAGNOSIS — Z79899 Other long term (current) drug therapy: Secondary | ICD-10-CM | POA: Diagnosis not present

## 2024-07-03 DIAGNOSIS — E1140 Type 2 diabetes mellitus with diabetic neuropathy, unspecified: Secondary | ICD-10-CM | POA: Diagnosis present

## 2024-07-03 DIAGNOSIS — G8929 Other chronic pain: Secondary | ICD-10-CM | POA: Diagnosis present

## 2024-07-03 DIAGNOSIS — B961 Klebsiella pneumoniae [K. pneumoniae] as the cause of diseases classified elsewhere: Secondary | ICD-10-CM | POA: Diagnosis not present

## 2024-07-03 DIAGNOSIS — Z794 Long term (current) use of insulin: Secondary | ICD-10-CM | POA: Diagnosis not present

## 2024-07-03 DIAGNOSIS — R8271 Bacteriuria: Secondary | ICD-10-CM | POA: Diagnosis not present

## 2024-07-03 DIAGNOSIS — N281 Cyst of kidney, acquired: Secondary | ICD-10-CM | POA: Diagnosis not present

## 2024-07-03 DIAGNOSIS — I4811 Longstanding persistent atrial fibrillation: Secondary | ICD-10-CM | POA: Diagnosis not present

## 2024-07-03 DIAGNOSIS — N3080 Other cystitis without hematuria: Secondary | ICD-10-CM | POA: Diagnosis not present

## 2024-07-03 DIAGNOSIS — Z1611 Resistance to penicillins: Secondary | ICD-10-CM | POA: Diagnosis present

## 2024-07-03 DIAGNOSIS — N179 Acute kidney failure, unspecified: Secondary | ICD-10-CM | POA: Diagnosis not present

## 2024-07-03 DIAGNOSIS — K668 Other specified disorders of peritoneum: Secondary | ICD-10-CM | POA: Diagnosis present

## 2024-07-03 DIAGNOSIS — E869 Volume depletion, unspecified: Secondary | ICD-10-CM | POA: Diagnosis present

## 2024-07-03 DIAGNOSIS — R319 Hematuria, unspecified: Secondary | ICD-10-CM | POA: Diagnosis not present

## 2024-07-03 DIAGNOSIS — E876 Hypokalemia: Secondary | ICD-10-CM | POA: Diagnosis present

## 2024-07-03 DIAGNOSIS — I10 Essential (primary) hypertension: Secondary | ICD-10-CM | POA: Diagnosis present

## 2024-07-03 DIAGNOSIS — N3081 Other cystitis with hematuria: Secondary | ICD-10-CM | POA: Diagnosis not present

## 2024-07-03 DIAGNOSIS — Z7901 Long term (current) use of anticoagulants: Secondary | ICD-10-CM | POA: Diagnosis not present

## 2024-07-03 DIAGNOSIS — N3000 Acute cystitis without hematuria: Secondary | ICD-10-CM | POA: Diagnosis not present

## 2024-07-03 DIAGNOSIS — Z87440 Personal history of urinary (tract) infections: Secondary | ICD-10-CM | POA: Diagnosis not present

## 2024-07-03 DIAGNOSIS — Z833 Family history of diabetes mellitus: Secondary | ICD-10-CM

## 2024-07-03 DIAGNOSIS — Z90710 Acquired absence of both cervix and uterus: Secondary | ICD-10-CM

## 2024-07-03 DIAGNOSIS — F0280 Dementia in other diseases classified elsewhere without behavioral disturbance: Secondary | ICD-10-CM | POA: Diagnosis present

## 2024-07-03 DIAGNOSIS — G301 Alzheimer's disease with late onset: Secondary | ICD-10-CM | POA: Diagnosis present

## 2024-07-03 DIAGNOSIS — Z7984 Long term (current) use of oral hypoglycemic drugs: Secondary | ICD-10-CM

## 2024-07-03 DIAGNOSIS — E785 Hyperlipidemia, unspecified: Secondary | ICD-10-CM | POA: Diagnosis not present

## 2024-07-03 DIAGNOSIS — E1165 Type 2 diabetes mellitus with hyperglycemia: Secondary | ICD-10-CM | POA: Diagnosis present

## 2024-07-03 DIAGNOSIS — R0989 Other specified symptoms and signs involving the circulatory and respiratory systems: Secondary | ICD-10-CM | POA: Diagnosis not present

## 2024-07-03 DIAGNOSIS — E871 Hypo-osmolality and hyponatremia: Secondary | ICD-10-CM

## 2024-07-03 DIAGNOSIS — D6832 Hemorrhagic disorder due to extrinsic circulating anticoagulants: Secondary | ICD-10-CM | POA: Diagnosis not present

## 2024-07-03 DIAGNOSIS — K5730 Diverticulosis of large intestine without perforation or abscess without bleeding: Secondary | ICD-10-CM | POA: Diagnosis not present

## 2024-07-03 LAB — LIPASE, BLOOD: Lipase: 60 U/L — ABNORMAL HIGH (ref 11–51)

## 2024-07-03 LAB — URINALYSIS, W/ REFLEX TO CULTURE (INFECTION SUSPECTED)

## 2024-07-03 LAB — COMPREHENSIVE METABOLIC PANEL WITH GFR
ALT: 32 U/L (ref 0–44)
AST: 29 U/L (ref 15–41)
Albumin: 3.8 g/dL (ref 3.5–5.0)
Alkaline Phosphatase: 129 U/L — ABNORMAL HIGH (ref 38–126)
Anion gap: 14 (ref 5–15)
BUN: 19 mg/dL (ref 8–23)
CO2: 19 mmol/L — ABNORMAL LOW (ref 22–32)
Calcium: 9.5 mg/dL (ref 8.9–10.3)
Chloride: 101 mmol/L (ref 98–111)
Creatinine, Ser: 1.28 mg/dL — ABNORMAL HIGH (ref 0.44–1.00)
GFR, Estimated: 42 mL/min — ABNORMAL LOW (ref >60)
Glucose, Bld: 451 mg/dL — ABNORMAL HIGH (ref 70–99)
Potassium: 4 mmol/L (ref 3.5–5.1)
Sodium: 134 mmol/L — ABNORMAL LOW (ref 135–145)
Total Bilirubin: 0.8 mg/dL (ref 0.0–1.2)
Total Protein: 7 g/dL (ref 6.5–8.1)

## 2024-07-03 LAB — CBC WITH DIFFERENTIAL/PLATELET
Abs Immature Granulocytes: 0.04 K/uL (ref 0.00–0.07)
Basophils Absolute: 0 K/uL (ref 0.0–0.1)
Basophils Relative: 0 %
Eosinophils Absolute: 0 K/uL (ref 0.0–0.5)
Eosinophils Relative: 0 %
HCT: 37.8 % (ref 36.0–46.0)
Hemoglobin: 12.5 g/dL (ref 12.0–15.0)
Immature Granulocytes: 0 %
Lymphocytes Relative: 11 %
Lymphs Abs: 1.3 K/uL (ref 0.7–4.0)
MCH: 31.8 pg (ref 26.0–34.0)
MCHC: 33.1 g/dL (ref 30.0–36.0)
MCV: 96.2 fL (ref 80.0–100.0)
Monocytes Absolute: 0.6 K/uL (ref 0.1–1.0)
Monocytes Relative: 5 %
Neutro Abs: 9.8 K/uL — ABNORMAL HIGH (ref 1.7–7.7)
Neutrophils Relative %: 84 %
Platelets: 273 K/uL (ref 150–400)
RBC: 3.93 MIL/uL (ref 3.87–5.11)
RDW: 12 % (ref 11.5–15.5)
WBC: 11.7 K/uL — ABNORMAL HIGH (ref 4.0–10.5)
nRBC: 0 % (ref 0.0–0.2)

## 2024-07-03 LAB — GLUCOSE, CAPILLARY: Glucose-Capillary: 267 mg/dL — ABNORMAL HIGH (ref 70–99)

## 2024-07-03 LAB — LACTIC ACID, PLASMA: Lactic Acid, Venous: 4 mmol/L (ref 0.5–1.9)

## 2024-07-03 MED ORDER — MEMANTINE HCL 10 MG PO TABS
10.0000 mg | ORAL_TABLET | Freq: Two times a day (BID) | ORAL | Status: DC
  Administered 2024-07-04 – 2024-07-07 (×8): 10 mg via ORAL
  Filled 2024-07-03 (×9): qty 1

## 2024-07-03 MED ORDER — INSULIN ASPART 100 UNIT/ML IJ SOLN
0.0000 [IU] | INTRAMUSCULAR | Status: DC
  Administered 2024-07-04: 2 [IU] via SUBCUTANEOUS
  Administered 2024-07-04: 5 [IU] via SUBCUTANEOUS
  Administered 2024-07-04: 2 [IU] via SUBCUTANEOUS
  Administered 2024-07-04 (×2): 5 [IU] via SUBCUTANEOUS
  Administered 2024-07-04: 3 [IU] via SUBCUTANEOUS
  Administered 2024-07-05: 5 [IU] via SUBCUTANEOUS
  Administered 2024-07-05: 3 [IU] via SUBCUTANEOUS
  Administered 2024-07-05: 7 [IU] via SUBCUTANEOUS

## 2024-07-03 MED ORDER — SODIUM CHLORIDE 0.9 % IV SOLN
1.0000 g | Freq: Once | INTRAVENOUS | Status: AC
  Administered 2024-07-03: 1 g via INTRAVENOUS
  Filled 2024-07-03: qty 10

## 2024-07-03 MED ORDER — METRONIDAZOLE 500 MG/100ML IV SOLN
500.0000 mg | Freq: Two times a day (BID) | INTRAVENOUS | Status: DC
  Administered 2024-07-04 – 2024-07-06 (×5): 500 mg via INTRAVENOUS
  Filled 2024-07-03 (×5): qty 100

## 2024-07-03 MED ORDER — METRONIDAZOLE 500 MG/100ML IV SOLN
500.0000 mg | Freq: Once | INTRAVENOUS | Status: AC
  Administered 2024-07-03: 500 mg via INTRAVENOUS
  Filled 2024-07-03: qty 100

## 2024-07-03 MED ORDER — NALOXONE HCL 0.4 MG/ML IJ SOLN: 0.4000 mg | INTRAMUSCULAR | Status: DC | PRN

## 2024-07-03 MED ORDER — ACETAMINOPHEN 325 MG PO TABS
650.0000 mg | ORAL_TABLET | Freq: Four times a day (QID) | ORAL | Status: DC | PRN
  Administered 2024-07-06: 650 mg via ORAL
  Filled 2024-07-03: qty 2

## 2024-07-03 MED ORDER — DONEPEZIL HCL 10 MG PO TABS
10.0000 mg | ORAL_TABLET | Freq: Every day | ORAL | Status: DC
  Administered 2024-07-04 – 2024-07-06 (×3): 10 mg via ORAL
  Filled 2024-07-03 (×3): qty 1

## 2024-07-03 MED ORDER — PREGABALIN 50 MG PO CAPS
100.0000 mg | ORAL_CAPSULE | Freq: Every evening | ORAL | Status: DC
  Administered 2024-07-04 – 2024-07-06 (×4): 100 mg via ORAL
  Filled 2024-07-03 (×4): qty 2

## 2024-07-03 MED ORDER — ATORVASTATIN CALCIUM 10 MG PO TABS
20.0000 mg | ORAL_TABLET | Freq: Every day | ORAL | Status: DC
  Administered 2024-07-04 – 2024-07-07 (×4): 20 mg via ORAL
  Filled 2024-07-03 (×4): qty 2

## 2024-07-03 MED ORDER — IOHEXOL 350 MG/ML SOLN
75.0000 mL | Freq: Once | INTRAVENOUS | Status: AC | PRN
  Administered 2024-07-03: 75 mL via INTRAVENOUS

## 2024-07-03 MED ORDER — ACETAMINOPHEN 650 MG RE SUPP: 650.0000 mg | Freq: Four times a day (QID) | RECTAL | Status: DC | PRN

## 2024-07-03 MED ORDER — SODIUM CHLORIDE 0.9 % IV SOLN: INTRAVENOUS | Status: DC

## 2024-07-03 MED ORDER — OXYCODONE HCL 5 MG PO TABS: 5.0000 mg | ORAL_TABLET | Freq: Four times a day (QID) | ORAL | Status: DC | PRN

## 2024-07-03 MED ORDER — HYDRALAZINE HCL 20 MG/ML IJ SOLN: 5.0000 mg | Freq: Four times a day (QID) | INTRAMUSCULAR | Status: DC | PRN

## 2024-07-03 MED ORDER — SODIUM CHLORIDE 0.9 % IV SOLN
1.0000 g | INTRAVENOUS | Status: DC
  Administered 2024-07-04 – 2024-07-06 (×3): 1 g via INTRAVENOUS
  Filled 2024-07-03 (×3): qty 10

## 2024-07-03 MED ORDER — SODIUM CHLORIDE 0.9 % IV SOLN: Freq: Once | INTRAVENOUS | Status: AC

## 2024-07-03 NOTE — ED Notes (Signed)
 Went to CT

## 2024-07-03 NOTE — H&P (Signed)
 History and Physical    Jacqueline Ball FMW:[id number] DOB: Apr 29, 1941 DOA: 07/03/2024  PCP: Haze Kingfisher, MD  Patient coming from: Home  Chief Complaint: Suprapubic discomfort  HPI: Jacqueline Ball is a 83 y.o. female with medical history significant of paroxysmal A-fib on Eliquis , hypertension, hyperlipidemia, type 2 diabetes, dementia, anxiety presenting with complaints of dysuria, urinary frequency/urgency, and suprapubic discomfort x 1 week.  Her urine has appeared grossly bloody at home.  She takes Eliquis  for A-fib.  Reporting generalized weakness and subjective fever/chills.  Denies flank pain.  No other complaints.  Denies shortness of breath or chest pain.  ED Course: Blood pressure 187/98 on arrival but remainder vital signs stable. Labs notable for WBC count 11.7, sodium 134, glucose 451, bicarb 19, anion gap 14, creatinine 1.28 (baseline 0.4-0.8), alk phos 129 and remainder of LFTs normal, lipase 60. Urine grossly bloody and turbid in appearance, microscopy showing 21-50 WBCs and rare bacteria. Urine culture in process. Blood cultures pending. Lactate pending. CT abdomen pelvis showing emphysematous cystitis with small volume free air dissecting into the extraperitoneal space surrounding the bladder. A few of the gaseous foci are closely associated with loops of small bowel in the lower abdomen and may be intraperitoneal. Patient was given ceftriaxone , metronidazole , and IV fluids. Foley catheter ordered. ED PA discussed the case with urologist Dr. Renda and general surgeon Dr. Paola. General surgery will review the patient's CT images and urology stated that they will consult in the morning.   Review of Systems:  Review of Systems  All other systems reviewed and are negative.   Past Medical History:  Diagnosis Date   Anxiety    Atrial fibrillation (HCC)    Diabetes mellitus    Hyperlipidemia    Hypertension    Memory loss    Mood disorder [facility])    Physical  exam, annual 10/22/06   Renal cyst     Past Surgical History:  Procedure Laterality Date   ABDOMINAL HYSTERECTOMY       reports that she has never smoked. She has never used smokeless tobacco. She reports current alcohol use of about 1.0 standard drink of alcohol per week. She reports that she does not use drugs.  Allergies  Allergen Reactions   Metoprolol  Nausea And Vomiting    Family History  Problem Relation Age of Onset   Diabetes Sister     Prior to Admission medications   Medication Sig Start Date End Date Taking? Authorizing Provider  acetaminophen  (TYLENOL ) 500 MG tablet Take 1,000 mg by mouth every 6 (six) hours as needed for pain.   Yes [provider]  apixaban  (ELIQUIS ) 2.5 MG TABS tablet Take 2.5 mg by mouth 2 (two) times daily.   Yes [provider]  atorvastatin  (LIPITOR) 20 MG tablet Take 20 mg by mouth daily.   Yes [provider]  diphenhydramine-acetaminophen  (TYLENOL  PM) 25-500 MG TABS tablet Take 1 tablet by mouth at bedtime.   Yes [provider]  donepezil  (ARICEPT ) 5 MG tablet Take 10 mg by mouth at bedtime. 01/02/24  Yes [provider]  memantine  (NAMENDA ) 10 MG tablet Take 1 tablet (10 mg at night) for 2 weeks, then increase to 1 tablet (10 mg) twice a day Patient taking differently: Take 10 mg by mouth 2 (two) times daily. 04/22/24  Yes Wertman, Sara E, PA-C  METFORMIN  HCL PO Take 750 mg by mouth daily.   Yes [provider]  pregabalin (LYRICA) 100 MG capsule Take 100 mg  by mouth every evening. 03/31/24  Yes [provider]    Physical Exam: Vitals:   07/03/24 1547 07/03/24 1614  BP: (!) 187/98   Pulse: 86   Resp: 17   Temp: 98.1 F (36.7 C)   SpO2: 100%   Weight:  54.4 kg  Height:  5' 5 (1.651 m)    Physical Exam Vitals reviewed.  Constitutional:      General: She is not in acute distress. HENT:     Head: Normocephalic and atraumatic.  Eyes:     Extraocular Movements:  Extraocular movements intact.  Cardiovascular:     Rate and Rhythm: Normal rate and regular rhythm.     Pulses: Normal pulses.  Pulmonary:     Effort: Pulmonary effort is normal. No respiratory distress.     Breath sounds: Normal breath sounds. No wheezing or rales.  Abdominal:     General: Bowel sounds are normal. There is no distension.     Palpations: Abdomen is soft.     Tenderness: There is abdominal tenderness. There is no guarding or rebound.     Comments: Suprapubic tenderness  Musculoskeletal:     Cervical back: Normal range of motion.     Right lower leg: No edema.     Left lower leg: No edema.  Skin:    General: Skin is warm and dry.  Neurological:     General: No focal deficit present.     Mental Status: She is alert and oriented to person, place, and time.     Labs on Admission: I have personally reviewed following labs and imaging studies  CBC: Recent Labs  Lab 07/03/24 1619  WBC 11.7*  NEUTROABS 9.8*  HGB 12.5  HCT 37.8  MCV 96.2  PLT 273   Basic Metabolic Panel: Recent Labs  Lab 07/03/24 1619  NA 134*  K 4.0  CL 101  CO2 19*  GLUCOSE 451*  BUN 19  CREATININE 1.28*  CALCIUM  9.5   GFR: Estimated Creatinine Clearance: 29.1 mL/min (A) (by C-G formula based on SCr of 1.28 mg/dL (H)). Liver Function Tests: Recent Labs  Lab 07/03/24 1619  AST 29  ALT 32  ALKPHOS 129*  BILITOT 0.8  PROT 7.0  ALBUMIN 3.8   Recent Labs  Lab 07/03/24 1619  LIPASE 60*   No results for input(s): AMMONIA in the last 168 hours. Coagulation Profile: No results for input(s): INR, PROTIME in the last 168 hours. Cardiac Enzymes: No results for input(s): CKTOTAL, CKMB, CKMBINDEX, TROPONINI in the last 168 hours. BNP (last 3 results) No results for input(s): PROBNP in the last 8760 hours. HbA1C: No results for input(s): HGBA1C in the last 72 hours. CBG: No results for input(s): GLUCAP in the last 168 hours. Lipid Profile: No results for  input(s): CHOL, HDL, LDLCALC, TRIG, CHOLHDL, LDLDIRECT in the last 72 hours. Thyroid  Function Tests: No results for input(s): TSH, T4TOTAL, FREET4, T3FREE, THYROIDAB in the last 72 hours. Anemia Panel: No results for input(s): VITAMINB12, FOLATE, FERRITIN, TIBC, IRON, RETICCTPCT in the last 72 hours. Urine analysis:    Component Value Date/Time   COLORURINE RED (A) 07/03/2024 1605   APPEARANCEUR TURBID (A) 07/03/2024 1605   LABSPEC  07/03/2024 1605    TEST NOT REPORTED DUE TO COLOR INTERFERENCE OF URINE PIGMENT   PHURINE  07/03/2024 1605    TEST NOT REPORTED DUE TO COLOR INTERFERENCE OF URINE PIGMENT   GLUCOSEU (A) 07/03/2024 1605    TEST NOT REPORTED DUE TO COLOR INTERFERENCE OF  URINE PIGMENT   HGBUR (A) 07/03/2024 1605    TEST NOT REPORTED DUE TO COLOR INTERFERENCE OF URINE PIGMENT   BILIRUBINUR (A) 07/03/2024 1605    TEST NOT REPORTED DUE TO COLOR INTERFERENCE OF URINE PIGMENT   BILIRUBINUR negative 07/16/2023 1939   KETONESUR (A) 07/03/2024 1605    TEST NOT REPORTED DUE TO COLOR INTERFERENCE OF URINE PIGMENT   PROTEINUR (A) 07/03/2024 1605    TEST NOT REPORTED DUE TO COLOR INTERFERENCE OF URINE PIGMENT   UROBILINOGEN 0.2 07/16/2023 1939   UROBILINOGEN 0.2 04/02/2020 1041   NITRITE (A) 07/03/2024 1605    TEST NOT REPORTED DUE TO COLOR INTERFERENCE OF URINE PIGMENT   LEUKOCYTESUR (A) 07/03/2024 1605    TEST NOT REPORTED DUE TO COLOR INTERFERENCE OF URINE PIGMENT    Radiological Exams on Admission: CT ABDOMEN PELVIS W CONTRAST Result Date: 07/03/2024 CLINICAL DATA:  Two day history of dysuria, urinary frequency, and hematuria. Urinary tract infection EXAM: CT ABDOMEN AND PELVIS WITH CONTRAST TECHNIQUE: Multidetector CT imaging of the abdomen and pelvis was performed using the standard protocol following bolus administration of intravenous contrast. RADIATION DOSE REDUCTION: This exam was performed according to the departmental dose-optimization  program which includes automated exposure control, adjustment of the mA and/or kV according to patient size and/or use of iterative reconstruction technique. CONTRAST:  75mL OMNIPAQUE  IOHEXOL  350 MG/ML SOLN COMPARISON:  CT abdomen and pelvis dated 06/02/2024 FINDINGS: Lower chest: No focal consolidation or pulmonary nodule in the lung bases. No pleural effusion or pneumothorax demonstrated. Partially imaged heart size is normal. Hepatobiliary: Scattered subcentimeter hypodensities, too small to characterize but likely cysts. No intra or extrahepatic biliary ductal dilation. Normal gallbladder. Pancreas: No focal lesions or main ductal dilation. Spleen: Normal in size without focal abnormality. Adrenals/Urinary Tract: No adrenal nodules. No suspicious renal mass, calculi or hydronephrosis. Unchanged multifocal left renal cysts, many of which are mildly hyperattenuating and likely hemorrhagic/proteinaceous. Multiple additional subcentimeter hypodensities, too small to characterize but also likely cysts. Extensive, circumferential submucosal emphysema. Circumferential mural thickening. Stomach/Bowel: Normal appearance of the stomach. No evidence of bowel wall thickening, distention, or inflammatory changes. Colonic diverticulosis without acute diverticulitis. Normal appendix. Vascular/Lymphatic: Aortic atherosclerosis. No enlarged abdominal or pelvic lymph nodes. Reproductive: No adnexal masses. Other: Small volume free air dissecting through the peritoneal fat surrounding the bladder. A few of the gaseous foci are closely associated with loops of small bowel in the lower abdomen. Musculoskeletal: No acute or abnormal lytic or blastic osseous lesions. Multilevel degenerative changes of the partially imaged thoracic and lumbar spine. IMPRESSION: 1. 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 A few of the gaseous foci are closely associated with  loops of small bowel in the lower abdomen and may be intraperitoneal. 2. Colonic diverticulosis without acute diverticulitis. 3.  Aortic Atherosclerosis (ICD10-I70.0). Critical Value/emergent results were called by telephone at the time of interpretation on 07/03/2024 at 7:24 pm to provider Carlo cornish, who verbally acknowledged these results. Electronically Signed   By: Limin  Xu M.D.   On: 07/03/2024 19:32    Assessment and Plan  Acute emphysematous cystitis No fever or significant leukocytosis.  No tachycardia or hypotension.  Urology will consult in the morning.  Continue empiric antibiotics, IV fluid hydration, pain management, and Foley catheter for bladder drainage.  Follow-up urine and blood cultures.  Lactate pending.  Optimize glycemic control.  General surgery to review patient's CT images, keep n.p.o. for now.  AKI Creatinine 1.28 (baseline  0.4-0.8).  No renal calculi or hydronephrosis on CT.  Continue IV fluid hydration and monitor renal function.  Avoid nephrotoxic agents.  Type 2 diabetes Glucose in the 400s on initial labs without signs of DKA.  Last A1c 6.0 in January 2025, repeat ordered.  I have requested RN to repeat CBG and ordered sensitive sliding scale insulin  every 4 hours for now.  Hypertension SBP currently in the 150s.  Patient is not on any antihypertensives at home.  Continue pain management and IV hydralazine PRN SBP >160.  Mild hyponatremia Continue IV fluid hydration and monitor sodium level.  Paroxysmal A-fib Hold Eliquis  at this time in the setting of gross hematuria.  Hyperlipidemia Continue Lipitor.  Dementia Continue Aricept  and Namenda .  Chronic pain/neuropathy Continue Lyrica.  DVT prophylaxis: SCDs Code Status: Full Code (discussed with the patient) Family Communication: Son at bedside. Level of care: Progressive Care Unit Admission status: It is my clinical opinion that admission to INPATIENT is reasonable and necessary because of the  expectation that this patient will require hospital care that crosses at least 2 midnights to treat this condition based on the medical complexity of the problems presented.  Given the aforementioned information, the predictability of an adverse outcome is felt to be significant.  Editha Ram MD [facility]  If 7PM-7AM, please contact night-coverage [email or web address]  07/03/2024, 9:51 PM

## 2024-07-03 NOTE — ED Notes (Signed)
 Currently not in room.

## 2024-07-03 NOTE — Progress Notes (Signed)
 Lactic Acid 4.0, paged Merrill NP.

## 2024-07-03 NOTE — ED Triage Notes (Signed)
 Pt comes with s/s of UTI.

## 2024-07-03 NOTE — Progress Notes (Signed)
 Received patient from ED to 2C01 via stretcher, accompanied by son.  Patient alert and oriented x 2-3. NAD.  VSS.  Patient and son oriented to room and plan of care.  Foley inserted, gross hematuria noted.  Patient tolerated insertion well, sterile technique maintained.

## 2024-07-03 NOTE — ED Provider Triage Note (Signed)
 Emergency Medicine Provider Triage Evaluation Note  Jacqueline Ball , a 83 y.o. female  was evaluated in triage.  Pt complains of UTI.  Review of Systems  Positive:  Negative:   Physical Exam  BP (!) 187/98   Pulse 86   Temp 98.1 Ball (36.7 C)   Resp 17   SpO2 100%  Gen:   Awake, no distress   Resp:  Normal effort  MSK:   Moves extremities without difficulty  Other:    Medical Decision Making  Medically screening exam initiated at 4:01 PM.  Appropriate orders placed.  Jacqueline Ball was informed that the remainder of the evaluation will be completed by another provider, this initial triage assessment does not replace that evaluation, and the importance of remaining in the ED until their evaluation is complete.  Urinary frequency, hematuria, dysuria x2 days. Also endorses generalized abdominal pain. Endorses subjective fever. PCP sent patient to ED after seeing UTI on UA.  Denies chest pain, dyspnea, cough, nausea, vomiting, diarrhea.    Jacqueline Ball, [place] 07/03/24 [phone]

## 2024-07-03 NOTE — ED Provider Notes (Cosign Needed Addendum)
 [place] EMERGENCY DEPARTMENT AT [facility] Provider Note   CSN: [id number] Arrival date & time: 07/03/24  1544     Patient presents with: Urinary Frequency   Jacqueline Ball is a 83 y.o. female Alzheimer's, hypertension, type 2 diabetes presents to the emerged from today for evaluation of dysuria, hematuria with urgency and frequency for the past 2 days.  She comes accompanied with caretaker who also contributes to the history.  Patient also reports having some suprapubic pain for the past 2 days.  Denies any fevers.  Patient reports some nausea however companion reports that her appetite has been at baseline other than this morning where she did not eat much of her breakfast.  Denies any fevers or vomiting.  No diarrhea or constipation.  Reports that she feels similar to her other UTIs she has had before.  Caretaker reports that she is at her baseline mental health, no increasing confusion.   Urinary Frequency Associated symptoms include abdominal pain. Pertinent negatives include no chest pain and no shortness of breath.       Prior to Admission medications   Medication Sig Start Date End Date Taking? Authorizing Provider  acetaminophen  (TYLENOL ) 500 MG tablet Take 1,000 mg by mouth every 6 (six) hours as needed for pain.   Yes [provider]  apixaban  (ELIQUIS ) 2.5 MG TABS tablet Take 2.5 mg by mouth 2 (two) times daily.   Yes [provider]  atorvastatin  (LIPITOR) 20 MG tablet Take 20 mg by mouth daily.   Yes [provider]  diphenhydramine-acetaminophen  (TYLENOL  PM) 25-500 MG TABS tablet Take 1 tablet by mouth at bedtime.   Yes [provider]  donepezil  (ARICEPT ) 5 MG tablet Take 10 mg by mouth at bedtime. 01/02/24  Yes [provider]  memantine  (NAMENDA ) 10 MG tablet Take 1 tablet (10 mg at night) for 2 weeks, then increase to 1 tablet (10 mg) twice a day Patient taking differently: Take 10 mg by mouth 2 (two) times  daily. 04/22/24  Yes Wertman, Sara E, PA-C  METFORMIN  HCL PO Take 750 mg by mouth daily.   Yes [provider]  pregabalin  (LYRICA ) 100 MG capsule Take 100 mg by mouth every evening. 03/31/24  Yes [provider]    Allergies: Metoprolol     Review of Systems  Constitutional:  Negative for chills and fever.  Respiratory:  Negative for shortness of breath.   Cardiovascular:  Negative for chest pain.  Gastrointestinal:  Positive for abdominal pain and nausea. Negative for constipation, diarrhea and vomiting.  Genitourinary:  Positive for dysuria, frequency, hematuria and urgency.    Updated Vital Signs BP (!) 153/90 (BP Location: Right Arm)   Pulse 65   Temp 98.3 F (36.8 C) (Oral)   Resp 20   Ht 5' 5 (1.651 m)   Wt 54.4 kg   SpO2 100%   BMI 19.97 kg/m   Physical Exam Vitals and nursing note reviewed.  Constitutional:      General: She is not in acute distress.    Appearance: She is not ill-appearing or toxic-appearing.  HENT:     Mouth/Throat:     Mouth: Mucous membranes are moist.  Eyes:     General: No scleral icterus. Cardiovascular:     Rate and Rhythm: Normal rate.  Pulmonary:     Effort: No respiratory distress.  Abdominal:     General: There is no distension.     Palpations: Abdomen is soft.  Tenderness: There is abdominal tenderness. There is no guarding or rebound.     Comments: Mild suprapubic tenderness palpation.  Patient reports that she does have some pain but also sense of needing to urinate.  Soft.  No guarding or rebound.  Skin:    General: Skin is warm and dry.  Neurological:     Mental Status: She is alert.     (all labs ordered are listed, but only abnormal results are displayed) Labs Reviewed  COMPREHENSIVE METABOLIC PANEL WITH GFR - Abnormal; Notable for the following components:      Result Value   Sodium 134 (*)    CO2 19 (*)    Glucose, Bld 451 (*)    Creatinine, Ser 1.28 (*)    Alkaline Phosphatase 129 (*)     GFR, Estimated 42 (*)    All other components within normal limits  LIPASE, BLOOD - Abnormal; Notable for the following components:   Lipase 60 (*)    All other components within normal limits  CBC WITH DIFFERENTIAL/PLATELET - Abnormal; Notable for the following components:   WBC 11.7 (*)    Neutro Abs 9.8 (*)    All other components within normal limits  URINALYSIS, W/ REFLEX TO CULTURE (INFECTION SUSPECTED) - Abnormal; Notable for the following components:   Color, Urine RED (*)    APPearance TURBID (*)    Glucose, UA   (*)    Value: TEST NOT REPORTED DUE TO COLOR INTERFERENCE OF URINE PIGMENT   Hgb urine dipstick   (*)    Value: TEST NOT REPORTED DUE TO COLOR INTERFERENCE OF URINE PIGMENT   Bilirubin Urine   (*)    Value: TEST NOT REPORTED DUE TO COLOR INTERFERENCE OF URINE PIGMENT   Ketones, ur   (*)    Value: TEST NOT REPORTED DUE TO COLOR INTERFERENCE OF URINE PIGMENT   Protein, ur   (*)    Value: TEST NOT REPORTED DUE TO COLOR INTERFERENCE OF URINE PIGMENT   Nitrite   (*)    Value: TEST NOT REPORTED DUE TO COLOR INTERFERENCE OF URINE PIGMENT   Leukocytes,Ua   (*)    Value: TEST NOT REPORTED DUE TO COLOR INTERFERENCE OF URINE PIGMENT   Bacteria, UA RARE (*)    All other components within normal limits  LACTIC ACID, PLASMA - Abnormal; Notable for the following components:   Lactic Acid, Venous 4.0 (*)    All other components within normal limits  URINE CULTURE  CULTURE, BLOOD (ROUTINE X 2)  CULTURE, BLOOD (ROUTINE X 2)  MRSA NEXT GEN BY PCR, NASAL  LACTIC ACID, PLASMA  LACTIC ACID, PLASMA  CBC  BASIC METABOLIC PANEL WITH GFR    EKG: None  Radiology: CT ABDOMEN PELVIS W CONTRAST Result Date: 07/03/2024 CLINICAL DATA:  Two day history of dysuria, urinary frequency, and hematuria. Urinary tract infection EXAM: CT ABDOMEN AND PELVIS WITH CONTRAST TECHNIQUE: Multidetector CT imaging of the abdomen and pelvis was performed using the standard protocol following bolus  administration of intravenous contrast. RADIATION DOSE REDUCTION: This exam was performed according to the departmental dose-optimization program which includes automated exposure control, adjustment of the mA and/or kV according to patient size and/or use of iterative reconstruction technique. CONTRAST:  75mL OMNIPAQUE  IOHEXOL  350 MG/ML SOLN COMPARISON:  CT abdomen and pelvis dated 06/02/2024 FINDINGS: Lower chest: No focal consolidation or pulmonary nodule in the lung bases. No pleural effusion or pneumothorax demonstrated. Partially imaged heart size is normal. Hepatobiliary: Scattered subcentimeter hypodensities, too  small to characterize but likely cysts. No intra or extrahepatic biliary ductal dilation. Normal gallbladder. Pancreas: No focal lesions or main ductal dilation. Spleen: Normal in size without focal abnormality. Adrenals/Urinary Tract: No adrenal nodules. No suspicious renal mass, calculi or hydronephrosis. Unchanged multifocal left renal cysts, many of which are mildly hyperattenuating and likely hemorrhagic/proteinaceous. Multiple additional subcentimeter hypodensities, too small to characterize but also likely cysts. Extensive, circumferential submucosal emphysema. Circumferential mural thickening. Stomach/Bowel: Normal appearance of the stomach. No evidence of bowel wall thickening, distention, or inflammatory changes. Colonic diverticulosis without acute diverticulitis. Normal appendix. Vascular/Lymphatic: Aortic atherosclerosis. No enlarged abdominal or pelvic lymph nodes. Reproductive: No adnexal masses. Other: Small volume free air dissecting through the peritoneal fat surrounding the bladder. A few of the gaseous foci are closely associated with loops of small bowel in the lower abdomen. Musculoskeletal: No acute or abnormal lytic or blastic osseous lesions. Multilevel degenerative changes of the partially imaged thoracic and lumbar spine. IMPRESSION: 1. 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 A few of the gaseous foci are closely associated with loops of small bowel in the lower abdomen and may be intraperitoneal. 2. Colonic diverticulosis without acute diverticulitis. 3.  Aortic Atherosclerosis (ICD10-I70.0). Critical Value/emergent results were called by telephone at the time of interpretation on 07/03/2024 at 7:24 pm to provider Carlo cornish, who verbally acknowledged these results. Electronically Signed   By: Limin  Xu M.D.   On: 07/03/2024 19:32   Procedures   Medications Ordered in the ED  atorvastatin  (LIPITOR) tablet 20 mg (has no administration in time range)  donepezil  (ARICEPT ) tablet 10 mg (has no administration in time range)  memantine  (NAMENDA ) tablet 10 mg (has no administration in time range)  pregabalin  (LYRICA ) capsule 100 mg (has no administration in time range)  cefTRIAXone  (ROCEPHIN ) 1 g in sodium chloride  0.9 % 100 mL IVPB (has no administration in time range)  metroNIDAZOLE  (FLAGYL ) IVPB 500 mg (has no administration in time range)  0.9 %  sodium chloride  infusion (has no administration in time range)  acetaminophen  (TYLENOL ) tablet 650 mg (has no administration in time range)    Or  acetaminophen  (TYLENOL ) suppository 650 mg (has no administration in time range)  oxyCODONE  (Oxy IR/ROXICODONE ) immediate release tablet 5 mg (has no administration in time range)  naloxone  (NARCAN ) injection 0.4 mg (has no administration in time range)  insulin  aspart (novoLOG ) injection 0-9 Units (has no administration in time range)  hydrALAZINE  (APRESOLINE ) injection 5 mg (has no administration in time range)  0.9 %  sodium chloride  infusion ( Intravenous New Bag/Given 07/03/24 1951)  cefTRIAXone  (ROCEPHIN ) 1 g in sodium chloride  0.9 % 100 mL IVPB (0 g Intravenous Stopped 07/03/24 2052)  iohexol  (OMNIPAQUE ) 350 MG/ML injection 75 mL (75 mLs Intravenous Contrast Given 07/03/24 1901)  metroNIDAZOLE   (FLAGYL ) IVPB 500 mg (500 mg Intravenous New Bag/Given 07/03/24 2055)    Clinical Course as of 07/04/24 0105  Fri Jul 03, 2024  1943 Speaking with Urology, Dr. Renda. He is concerned for possible fistula formation. He will see the patient in the morning. No additional imaging recommendations for tonight.  He agrees with the Rocephin  and Flagyl .  Urine cultures are already pending.  He asked that general surgery be consulted just in case they see any additional source of intraperitoneal free air. Recommended foley catheter with 18Fr. [RR]  1949 Spoke with general surgery, spoke with Dr. Paola who will review the images.  [RR]  Clinical Course User Index [RR] Bernis Ernst, PA-C    Medical Decision Making Amount and/or Complexity of Data Reviewed Labs: ordered.  Risk Prescription drug management. Decision regarding hospitalization.   83 y.o. female presents to the ER for evaluation of dysuria. Differential diagnosis includes but is not limited to UTI, pyelonephritis, STD, urethritis, nephrolithiasis, PID, interstitial cystitis. Vital signs elevated BP otherwise unremarkable. Physical exam as noted above.   I independently reviewed and interpreted the patient's labs.  CBC shows white count 11.7 with a left shift.  No anemia.  CMP shows a 134, bicarb at 19, glucose of 451, creatinine 1.28, alk phos at 129.  It appears baseline for creatinine is around 0.8-0.4.  Urinalysis shows red and turbid urine that was unable to be fully tested given the amount of blood.  There is 21-50 white blood cells seen with rare bacteria.  Urine culture pending.  Lipase at 60.  It appears it was elevated to 100 her last visit. Urine culture pending.   CT ABD shows 1. 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 A few of the gaseous foci are closely associated with loops of small bowel in the lower abdomen and may be intraperitoneal. 2. Colonic  diverticulosis without acute diverticulitis. 3.  Aortic Atherosclerosis. Per radiologist's interpretation.    Given CT findings for possible intraperitoneal air, I spoke with Urology and general surgery. Please see ED course for information.   I have added on Flagyl  for intraabdominal etiology and blood cultures/lactic. Both are pending. She is not hypotensive or tachycardic. Her WBC <12. Does not meet sepsis/SIRS criteria currently.  Will need admission.  Hospitalist to admit.   Portions of this report may have been transcribed using voice recognition software. Every effort was made to ensure accuracy; however, inadvertent computerized transcription errors may be present.    Final diagnoses:  AKI (acute kidney injury) (HCC)  Emphysematous cystitis  Free intraperitoneal air    ED Discharge Orders     None          Bernis Ernst, PA-C 07/03/24 2332    Bernis Ernst, PA-C 07/04/24 0105    Emil Share, DO 07/06/24 [phone]

## 2024-07-03 NOTE — Plan of Care (Signed)
  Problem: Education: Goal: Knowledge of General Education information will improve Description: Including pain rating scale, medication(s)/side effects and non-pharmacologic comfort measures Outcome: Progressing   Problem: Health Behavior/Discharge Planning: Goal: Ability to manage health-related needs will improve Outcome: Progressing   Problem: Clinical Measurements: Goal: Ability to maintain clinical measurements within normal limits will improve Outcome: Progressing Goal: Will remain free from infection Outcome: Progressing Goal: Diagnostic test results will improve Outcome: Progressing Goal: Respiratory complications will improve Outcome: Progressing Goal: Cardiovascular complication will be avoided Outcome: Progressing   Problem: Activity: Goal: Risk for activity intolerance will decrease Outcome: Progressing   Problem: Nutrition: Goal: Adequate nutrition will be maintained Outcome: Progressing   Problem: Coping: Goal: Level of anxiety will decrease Outcome: Progressing   Problem: Elimination: Goal: Will not experience complications related to bowel motility Outcome: Progressing Goal: Will not experience complications related to urinary retention Outcome: Progressing   Problem: Pain Managment: Goal: General experience of comfort will improve and/or be controlled Outcome: Progressing   Problem: Safety: Goal: Ability to remain free from injury will improve Outcome: Progressing   Problem: Skin Integrity: Goal: Risk for impaired skin integrity will decrease Outcome: Progressing   Problem: Education: Goal: Ability to describe self-care measures that may prevent or decrease complications (Diabetes Survival Skills Education) will improve Outcome: Progressing Goal: Individualized Educational Video(s) Outcome: Progressing   Problem: Coping: Goal: Ability to adjust to condition or change in health will improve Outcome: Progressing   Problem: Fluid  Volume: Goal: Ability to maintain a balanced intake and output will improve Outcome: Progressing   Problem: Health Behavior/Discharge Planning: Goal: Ability to identify and utilize available resources and services will improve Outcome: Progressing Goal: Ability to manage health-related needs will improve Outcome: Progressing   Problem: Metabolic: Goal: Ability to maintain appropriate glucose levels will improve Outcome: Progressing   Problem: Nutritional: Goal: Maintenance of adequate nutrition will improve Outcome: Progressing Goal: Progress toward achieving an optimal weight will improve Outcome: Progressing   Problem: Skin Integrity: Goal: Risk for impaired skin integrity will decrease Outcome: Progressing

## 2024-07-04 ENCOUNTER — Encounter (HOSPITAL_COMMUNITY): Payer: Self-pay | Admitting: Internal Medicine

## 2024-07-04 DIAGNOSIS — N3080 Other cystitis without hematuria: Secondary | ICD-10-CM | POA: Diagnosis not present

## 2024-07-04 LAB — CBC
HCT: 34.4 % — ABNORMAL LOW (ref 36.0–46.0)
Hemoglobin: 11.9 g/dL — ABNORMAL LOW (ref 12.0–15.0)
MCH: 31.6 pg (ref 26.0–34.0)
MCHC: 34.6 g/dL (ref 30.0–36.0)
MCV: 91.2 fL (ref 80.0–100.0)
Platelets: 242 K/uL (ref 150–400)
RBC: 3.77 MIL/uL — ABNORMAL LOW (ref 3.87–5.11)
RDW: 12.3 % (ref 11.5–15.5)
WBC: 10.1 K/uL (ref 4.0–10.5)
nRBC: 0 % (ref 0.0–0.2)

## 2024-07-04 LAB — LACTIC ACID, PLASMA: Lactic Acid, Venous: 1.3 mmol/L (ref 0.5–1.9)

## 2024-07-04 LAB — GLUCOSE, CAPILLARY
Glucose-Capillary: 108 mg/dL — ABNORMAL HIGH (ref 70–99)
Glucose-Capillary: 163 mg/dL — ABNORMAL HIGH (ref 70–99)
Glucose-Capillary: 174 mg/dL — ABNORMAL HIGH (ref 70–99)
Glucose-Capillary: 271 mg/dL — ABNORMAL HIGH (ref 70–99)
Glucose-Capillary: 225 mg/dL — ABNORMAL HIGH (ref 70–99)
Glucose-Capillary: 283 mg/dL — ABNORMAL HIGH (ref 70–99)

## 2024-07-04 LAB — BASIC METABOLIC PANEL WITH GFR
Anion gap: 8 (ref 5–15)
BUN: 15 mg/dL (ref 8–23)
CO2: 23 mmol/L (ref 22–32)
Calcium: 9.2 mg/dL (ref 8.9–10.3)
Chloride: 107 mmol/L (ref 98–111)
Creatinine, Ser: 0.74 mg/dL (ref 0.44–1.00)
GFR, Estimated: >60 mL/min (ref >60)
Glucose, Bld: 167 mg/dL — ABNORMAL HIGH (ref 70–99)
Potassium: 3 mmol/L — ABNORMAL LOW (ref 3.5–5.1)
Sodium: 138 mmol/L (ref 135–145)

## 2024-07-04 LAB — MRSA NEXT GEN BY PCR, NASAL: MRSA by PCR Next Gen: NOT DETECTED

## 2024-07-04 MED ORDER — LACTATED RINGERS IV BOLUS
500.0000 mL | Freq: Once | INTRAVENOUS | Status: AC
  Administered 2024-07-04: 500 mL via INTRAVENOUS

## 2024-07-04 MED ORDER — LACTATED RINGERS IV SOLN: INTRAVENOUS | Status: AC

## 2024-07-04 MED ORDER — CHLORHEXIDINE GLUCONATE CLOTH 2 % EX PADS
6.0000 | MEDICATED_PAD | Freq: Every day | CUTANEOUS | Status: DC
  Administered 2024-07-04 – 2024-07-07 (×4): 6 via TOPICAL

## 2024-07-04 MED ORDER — LACTATED RINGERS IV SOLN: INTRAVENOUS | Status: DC

## 2024-07-04 NOTE — Progress Notes (Signed)
 Mobility Specialist Progress Note;   07/04/24 1046  Mobility  Activity Ambulated with assistance in room  Level of Assistance Contact guard assist, steadying assist  Assistive Device None  Distance Ambulated (ft) 30 ft  Activity Response Tolerated fair  Mobility Referral Yes  Mobility visit 1 Mobility  Mobility Specialist Start Time (ACUTE ONLY) 1046  Mobility Specialist Stop Time (ACUTE ONLY) 1058  Mobility Specialist Time Calculation (min) (ACUTE ONLY) 12 min    Pre-mobility: BP 99/65 (75) Post-mobility: BP 96/68 (78)  Pt agreeable to mobility. Required MinG assistance during ambulation for safety. Bps taken before and after mobility (see above). No c/o dizziness when asked. Pt returned back to bed with all needs met, call bell in reach. Alarm on. Son present.   Lauraine Erm Mobility Specialist Please contact via SecureChat or [employer] [phone]

## 2024-07-04 NOTE — Consult Note (Signed)
 Reason for Consult/Chief Complaint: pneumoperitoneum Consultant: Bernis, [place]  Jacqueline Ball is an 83 y.o. female.   HPI: 32F with DM and h/o chronic bacteruria and recurrent UTI. She is followed by Urology and had recent eval for hematuria and cystoscopy in the office with subsequent tx of UTI with macrobid. She presented to the ED with SP pain and dysuria, CT notable for emphysematous cystitis with small dots of intraperitoneal gas adjacent to the bladder. GS consulted to r/o surgical problem. Initial LA 4, promptly downtrended to 1.3  Past Medical History:  Diagnosis Date   Anxiety    Atrial fibrillation (HCC)    Diabetes mellitus    Hyperlipidemia    Hypertension    Memory loss    Mood disorder [facility])    Physical exam, annual 10/22/06   Renal cyst     Past Surgical History:  Procedure Laterality Date   ABDOMINAL HYSTERECTOMY      Family History  Problem Relation Age of Onset   Diabetes Sister     Social History:  reports that she has never smoked. She has never used smokeless tobacco. She reports current alcohol use of about 1.0 standard drink of alcohol per week. She reports that she does not use drugs.  Allergies:  Allergies  Allergen Reactions   Metoprolol  Nausea And Vomiting    Medications: I have reviewed the patient's current medications.  Results for orders placed or performed during the hospital encounter of 07/03/24 (from the past 48 hours)  Urinalysis, w/ Reflex to Culture (Infection Suspected) -Urine, Clean Catch     Status: Abnormal   Collection Time: 07/03/24  4:05 PM  Result Value Ref Range   Specimen Source URINE, CLEAN CATCH    Color, Urine RED (A) YELLOW    Comment: BIOCHEMICALS MAY BE AFFECTED BY COLOR   APPearance TURBID (A) CLEAR   Specific Gravity, Urine  1.005 - 1.030    TEST NOT REPORTED DUE TO COLOR INTERFERENCE OF URINE PIGMENT   pH  5.0 - 8.0    TEST NOT REPORTED DUE TO COLOR INTERFERENCE OF URINE PIGMENT   Glucose, UA (A)  NEGATIVE mg/dL    TEST NOT REPORTED DUE TO COLOR INTERFERENCE OF URINE PIGMENT   Hgb urine dipstick (A) NEGATIVE    TEST NOT REPORTED DUE TO COLOR INTERFERENCE OF URINE PIGMENT   Bilirubin Urine (A) NEGATIVE    TEST NOT REPORTED DUE TO COLOR INTERFERENCE OF URINE PIGMENT   Ketones, ur (A) NEGATIVE mg/dL    TEST NOT REPORTED DUE TO COLOR INTERFERENCE OF URINE PIGMENT   Protein, ur (A) NEGATIVE mg/dL    TEST NOT REPORTED DUE TO COLOR INTERFERENCE OF URINE PIGMENT   Nitrite (A) NEGATIVE    TEST NOT REPORTED DUE TO COLOR INTERFERENCE OF URINE PIGMENT   Leukocytes,Ua (A) NEGATIVE    TEST NOT REPORTED DUE TO COLOR INTERFERENCE OF URINE PIGMENT   Squamous Epithelial / HPF 0-5 0 - 5 /HPF   WBC, UA 21-50 0 - 5 WBC/hpf    Comment: Reflex urine culture not performed if WBC <=10, OR if Squamous epithelial cells >5. If Squamous epithelial cells >5, suggest recollection.   RBC / HPF FIELD OBSCURED BY RBC'S 0 - 5 RBC/hpf   Bacteria, UA RARE (A) NONE SEEN    Comment: Performed at [facility], [address] [place], [place] [address]  Comprehensive metabolic panel     Status: Abnormal   Collection Time: 07/03/24  4:19 PM  Result Value Ref Range   Sodium 134 (L) 135 - 145 mmol/L   Potassium 4.0 3.5 - 5.1 mmol/L   Chloride 101 98 - 111 mmol/L   CO2 19 (L) 22 - 32 mmol/L   Glucose, Bld 451 (H) 70 - 99 mg/dL    Comment: Glucose reference range applies only to samples taken after fasting for at least 8 hours.   BUN 19 8 - 23 mg/dL   Creatinine, Ser 8.71 (H) 0.44 - 1.00 mg/dL   Calcium  9.5 8.9 - 10.3 mg/dL   Total Protein 7.0 6.5 - 8.1 g/dL   Albumin 3.8 3.5 - 5.0 g/dL   AST 29 15 - 41 U/L   ALT 32 0 - 44 U/L   Alkaline Phosphatase 129 (H) 38 - 126 U/L   Total Bilirubin 0.8 0.0 - 1.2 mg/dL   GFR, Estimated 42 (L) >60 mL/min    Comment: (NOTE) Calculated using the CKD-EPI Creatinine Equation (2021)    Anion gap 14 5 - 15    Comment: Performed at [facility], [address],  [place], [place] [address]  Lipase, blood     Status: Abnormal   Collection Time: 07/03/24  4:19 PM  Result Value Ref Range   Lipase 60 (H) 11 - 51 U/L    Comment: Performed at [facility], [address], [place], [place] [address]  CBC with Diff     Status: Abnormal   Collection Time: 07/03/24  4:19 PM  Result Value Ref Range   WBC 11.7 (H) 4.0 - 10.5 K/uL   RBC 3.93 3.87 - 5.11 MIL/uL   Hemoglobin 12.5 12.0 - 15.0 g/dL   HCT 62.1 63.9 - 53.9 %   MCV 96.2 80.0 - 100.0 fL   MCH 31.8 26.0 - 34.0 pg   MCHC 33.1 30.0 - 36.0 g/dL   RDW 87.9 88.4 - 84.4 %   Platelets 273 150 - 400 K/uL   nRBC 0.0 0.0 - 0.2 %   Neutrophils Relative % 84 %   Neutro Abs 9.8 (H) 1.7 - 7.7 K/uL   Lymphocytes Relative 11 %   Lymphs Abs 1.3 0.7 - 4.0 K/uL   Monocytes Relative 5 %   Monocytes Absolute 0.6 0.1 - 1.0 K/uL   Eosinophils Relative 0 %   Eosinophils Absolute 0.0 0.0 - 0.5 K/uL   Basophils Relative 0 %   Basophils Absolute 0.0 0.0 - 0.1 K/uL   Immature Granulocytes 0 %   Abs Immature Granulocytes 0.04 0.00 - 0.07 K/uL    Comment: Performed at [facility], [address] [place], [place] [address]  Blood culture (routine x 2)     Status: None (Preliminary result)   Collection Time: 07/03/24  5:50 PM   Specimen: BLOOD  Result Value Ref Range   Specimen Description BLOOD SITE NOT SPECIFIED    Special Requests      BOTTLES DRAWN AEROBIC AND ANAEROBIC Blood Culture results may not be optimal due to an inadequate volume of blood received in culture bottles   Culture      NO GROWTH < 12 HOURS Performed at [facility], [address] [place], [place] [address]    Report Status PENDING   Lactic acid, plasma     Status: Abnormal   Collection Time: 07/03/24  7:51 PM  Result Value Ref Range   Lactic Acid, Venous 4.0 (HH) 0.5 - 1.9 mmol/L    Comment: CRITICAL RESULT CALLED  TO, READ BACK BY AND VERIFIED WITH LILIAN, T. RN @2246  07/03/24 SATRAINR Performed at [facility]  Lab, [address], [place], [place] [address]   MRSA Next Gen by PCR, Nasal     Status: None   Collection Time: 07/03/24 11:31 PM   Specimen: Nasal Mucosa; Nasal Swab  Result Value Ref Range   MRSA by PCR Next Gen NOT DETECTED NOT DETECTED    Comment: (NOTE) The GeneXpert MRSA Assay (FDA approved for NASAL specimens only), is one component of a comprehensive MRSA colonization surveillance program. It is not intended to diagnose MRSA infection nor to guide or monitor treatment for MRSA infections. Test performance is not FDA approved in patients less than 74 years old. Performed at [facility], [address], [place], [place] [address]   Glucose, capillary     Status: Abnormal   Collection Time: 07/03/24 11:44 PM  Result Value Ref Range   Glucose-Capillary 267 (H) 70 - 99 mg/dL    Comment: Glucose reference range applies only to samples taken after fasting for at least 8 hours.   Comment 1 Document in Chart   Lactic acid, plasma     Status: None   Collection Time: 07/03/24 11:49 PM  Result Value Ref Range   Lactic Acid, Venous 1.3 0.5 - 1.9 mmol/L    Comment: Performed at [facility] Lab, [address], [place], [place] [address]  CBC     Status: Abnormal   Collection Time: 07/04/24  2:09 AM  Result Value Ref Range   WBC 10.1 4.0 - 10.5 K/uL   RBC 3.77 (L) 3.87 - 5.11 MIL/uL   Hemoglobin 11.9 (L) 12.0 - 15.0 g/dL   HCT 65.5 (L) 63.9 - 53.9 %   MCV 91.2 80.0 - 100.0 fL   MCH 31.6 26.0 - 34.0 pg   MCHC 34.6 30.0 - 36.0 g/dL   RDW 87.6 88.4 - 84.4 %   Platelets 242 150 - 400 K/uL   nRBC 0.0 0.0 - 0.2 %    Comment: Performed at [facility], [address], [place], [place] [address]  Basic metabolic panel     Status: Abnormal   Collection Time: 07/04/24  2:09 AM  Result Value Ref Range   Sodium 138 135 - 145 mmol/L   Potassium 3.0 (L) 3.5 - 5.1 mmol/L   Chloride 107 98 - 111 mmol/L   CO2 23 22 - 32 mmol/L   Glucose, Bld 167 (H) 70 - 99 mg/dL    Comment:  Glucose reference range applies only to samples taken after fasting for at least 8 hours.   BUN 15 8 - 23 mg/dL   Creatinine, Ser 9.25 0.44 - 1.00 mg/dL   Calcium  9.2 8.9 - 10.3 mg/dL   GFR, Estimated >39 >39 mL/min    Comment: (NOTE) Calculated using the CKD-EPI Creatinine Equation (2021)    Anion gap 8 5 - 15    Comment: Performed at [facility], [address] [place], [place] [address]  Glucose, capillary     Status: Abnormal   Collection Time: 07/04/24  3:51 AM  Result Value Ref Range   Glucose-Capillary 108 (H) 70 - 99 mg/dL    Comment: Glucose reference range applies only to samples taken after fasting for at least 8 hours.   Comment 1 Document in Chart     CT ABDOMEN PELVIS W CONTRAST Result Date: 07/03/2024 CLINICAL DATA:  Two day history of dysuria,  urinary frequency, and hematuria. Urinary tract infection EXAM: CT ABDOMEN AND PELVIS WITH CONTRAST TECHNIQUE: Multidetector CT imaging of the abdomen and pelvis was performed using the standard protocol following bolus administration of intravenous contrast. RADIATION DOSE REDUCTION: This exam was performed according to the departmental dose-optimization program which includes automated exposure control, adjustment of the mA and/or kV according to patient size and/or use of iterative reconstruction technique. CONTRAST:  75mL OMNIPAQUE  IOHEXOL  350 MG/ML SOLN COMPARISON:  CT abdomen and pelvis dated 06/02/2024 FINDINGS: Lower chest: No focal consolidation or pulmonary nodule in the lung bases. No pleural effusion or pneumothorax demonstrated. Partially imaged heart size is normal. Hepatobiliary: Scattered subcentimeter hypodensities, too small to characterize but likely cysts. No intra or extrahepatic biliary ductal dilation. Normal gallbladder. Pancreas: No focal lesions or main ductal dilation. Spleen: Normal in size without focal abnormality. Adrenals/Urinary Tract: No adrenal nodules. No suspicious renal mass, calculi or  hydronephrosis. Unchanged multifocal left renal cysts, many of which are mildly hyperattenuating and likely hemorrhagic/proteinaceous. Multiple additional subcentimeter hypodensities, too small to characterize but also likely cysts. Extensive, circumferential submucosal emphysema. Circumferential mural thickening. Stomach/Bowel: Normal appearance of the stomach. No evidence of bowel wall thickening, distention, or inflammatory changes. Colonic diverticulosis without acute diverticulitis. Normal appendix. Vascular/Lymphatic: Aortic atherosclerosis. No enlarged abdominal or pelvic lymph nodes. Reproductive: No adnexal masses. Other: Small volume free air dissecting through the peritoneal fat surrounding the bladder. A few of the gaseous foci are closely associated with loops of small bowel in the lower abdomen. Musculoskeletal: No acute or abnormal lytic or blastic osseous lesions. Multilevel degenerative changes of the partially imaged thoracic and lumbar spine. IMPRESSION: 1. 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 A few of the gaseous foci are closely associated with loops of small bowel in the lower abdomen and may be intraperitoneal. 2. Colonic diverticulosis without acute diverticulitis. 3.  Aortic Atherosclerosis (ICD10-I70.0). Critical Value/emergent results were called by telephone at the time of interpretation on 07/03/2024 at 7:24 pm to provider Carlo cornish, who verbally acknowledged these results. Electronically Signed   By: Limin  Xu M.D.   On: 07/03/2024 19:32    ROS 10 point review of systems is negative except as listed above in HPI.   Physical Exam Blood pressure 91/66, pulse 76, temperature 98 F (36.7 C), temperature source Oral, resp. rate 20, height 5' 5 (1.651 m), weight 54.4 kg, SpO2 100%. Constitutional: well-developed, well-nourished HEENT: pupils equal, round, reactive to light, 2mm b/l, moist conjunctiva,  external inspection of ears and nose normal, hearing intact Oropharynx: normal oropharyngeal mucosa, normal dentition Neck: no thyromegaly, trachea midline, no midline cervical tenderness to palpation Chest: breath sounds equal bilaterally, normal respiratory effort, no midline or lateral chest wall tenderness to palpation/deformity Abdomen: soft, nontender, no bruising, no hepatosplenomegaly GU: foley  MSK: unable to assess gait/station, no clubbing/cyanosis of fingers/toes, normal ROM of all four extremities Skin: warm, dry, no rashes Psych: normal memory, normal mood/affect     Assessment/Plan: Pneumoperitoneum - suspect this is related to emphysematous cholecystitis. Benign abdominal exam and normalized lactate after resuscitation. No acute surgical needs, will sign off. Please feel free to re-consult surgery if additional questions or concerns arise.    Jacqueline GEANNIE Hanger, MD General and Trauma Surgery [facility]

## 2024-07-04 NOTE — Consult Note (Signed)
 Urology Consult   Physician requesting consult: Dr. Dana  Reason for consult: Emphysematous cystitis  History of Present Illness: Jacqueline Ball is a 83 y.o. recently evaluated by Dr. MacDiarmid for hematuria.  She did undergo office cystoscopy as part of her evaluation on 7/1 with no concerning pathology aside from findings consistent with cystitis.  She was treated with one week of nitrofurantoin and her urine culture from that visit was Klebsiella that was pansensitive (with the exception of ampicillin  resistance).  She has a history of chronic bacteruria/recurrent UTI and is diabetic.  She presented to the ED yesterday with worsening SP pain and dysuria.  She was afebrile and hemodynamically stable although her initial lactate was mildly elevated but responded with fluid resuscitation quickly.  She had a CT scan performed that demonstrated a large amount of air in her bladder and bladder wall consistent with emphysematous cystitis.  There was also concern for peritoneal air and general surgery was also consulted.  She feels somewhat better this morning after catheter placement and states that her SP pain is improving.   Past Medical History:  Diagnosis Date   Anxiety    Atrial fibrillation (HCC)    Diabetes mellitus    Hyperlipidemia    Hypertension    Memory loss    Mood disorder [facility])    Physical exam, annual 10/22/06   Renal cyst     Past Surgical History:  Procedure Laterality Date   ABDOMINAL HYSTERECTOMY       Current Hospital Medications:  Home meds:  No current facility-administered medications on file prior to encounter.   Current Outpatient Medications on File Prior to Encounter  Medication Sig Dispense Refill   acetaminophen  (TYLENOL ) 500 MG tablet Take 1,000 mg by mouth every 6 (six) hours as needed for pain.     apixaban  (ELIQUIS ) 2.5 MG TABS tablet Take 2.5 mg by mouth 2 (two) times daily.     atorvastatin  (LIPITOR) 20 MG tablet Take 20 mg by mouth  daily.     diphenhydramine-acetaminophen  (TYLENOL  PM) 25-500 MG TABS tablet Take 1 tablet by mouth at bedtime.     donepezil  (ARICEPT ) 5 MG tablet Take 10 mg by mouth at bedtime.     memantine  (NAMENDA ) 10 MG tablet Take 1 tablet (10 mg at night) for 2 weeks, then increase to 1 tablet (10 mg) twice a day (Patient taking differently: Take 10 mg by mouth 2 (two) times daily.) 180 tablet 3   METFORMIN  HCL PO Take 750 mg by mouth daily.     pregabalin  (LYRICA ) 100 MG capsule Take 100 mg by mouth every evening.       Scheduled Meds:  atorvastatin   20 mg Oral Daily   donepezil   10 mg Oral QHS   insulin  aspart  0-9 Units Subcutaneous Q4H   memantine   10 mg Oral BID   pregabalin   100 mg Oral QPM   Continuous Infusions:  sodium chloride  Stopped (07/04/24 0004)   cefTRIAXone  (ROCEPHIN )  IV     metronidazole  100 mL/hr at 07/04/24 0603   PRN Meds:.acetaminophen  **OR** acetaminophen , hydrALAZINE , naLOXone  (NARCAN )  injection, oxyCODONE   Allergies:  Allergies  Allergen Reactions   Metoprolol  Nausea And Vomiting    Family History  Problem Relation Age of Onset   Diabetes Sister     Social History:  reports that she has never smoked. She has never used smokeless tobacco. She reports current alcohol use of about 1.0 standard drink of alcohol per week. She reports that she  does not use drugs.  ROS: A complete review of systems was performed.  All systems are negative except for pertinent findings as noted.  Physical Exam:  Vital signs in last 24 hours: Temp:  [98 F (36.7 C)-98.3 F (36.8 C)] 98 F (36.7 C) (07/19 0355) Pulse Rate:  [65-86] 76 (07/19 0355) Resp:  [17-20] 20 (07/19 0355) BP: (91-187)/(66-98) 91/66 (07/19 0355) SpO2:  [99 %-100 %] 100 % (07/19 0355) Weight:  [54.4 kg] 54.4 kg (07/18 2227) Constitutional:  Alert and oriented, No acute distress Cardiovascular: Regular rate and rhythm, No JVD Respiratory: Normal respiratory effort GI: Abdomen is soft, nondistended, no  abdominal masses. No rebound tenderness or guarding.  Mild to moderate SP tenderness. GU: No CVA tenderness, Urine draining grossly clear urine Lymphatic: No lymphadenopathy Neurologic: Grossly intact, no focal deficits Psychiatric: Normal mood and affect  Laboratory Data:  Recent Labs    07/03/24 1619 07/04/24 0209  WBC 11.7* 10.1  HGB 12.5 11.9*  HCT 37.8 34.4*  PLT 273 242    Recent Labs    07/03/24 1619 07/04/24 0209  NA 134* 138  K 4.0 3.0*  CL 101 107  GLUCOSE 451* 167*  BUN 19 15  CALCIUM  9.5 9.2  CREATININE 1.28* 0.74     Results for orders placed or performed during the hospital encounter of 07/03/24 (from the past 24 hours)  Urinalysis, w/ Reflex to Culture (Infection Suspected) -Urine, Clean Catch     Status: Abnormal   Collection Time: 07/03/24  4:05 PM  Result Value Ref Range   Specimen Source URINE, CLEAN CATCH    Color, Urine RED (A) YELLOW   APPearance TURBID (A) CLEAR   Specific Gravity, Urine  1.005 - 1.030    TEST NOT REPORTED DUE TO COLOR INTERFERENCE OF URINE PIGMENT   pH  5.0 - 8.0    TEST NOT REPORTED DUE TO COLOR INTERFERENCE OF URINE PIGMENT   Glucose, UA (A) NEGATIVE mg/dL    TEST NOT REPORTED DUE TO COLOR INTERFERENCE OF URINE PIGMENT   Hgb urine dipstick (A) NEGATIVE    TEST NOT REPORTED DUE TO COLOR INTERFERENCE OF URINE PIGMENT   Bilirubin Urine (A) NEGATIVE    TEST NOT REPORTED DUE TO COLOR INTERFERENCE OF URINE PIGMENT   Ketones, ur (A) NEGATIVE mg/dL    TEST NOT REPORTED DUE TO COLOR INTERFERENCE OF URINE PIGMENT   Protein, ur (A) NEGATIVE mg/dL    TEST NOT REPORTED DUE TO COLOR INTERFERENCE OF URINE PIGMENT   Nitrite (A) NEGATIVE    TEST NOT REPORTED DUE TO COLOR INTERFERENCE OF URINE PIGMENT   Leukocytes,Ua (A) NEGATIVE    TEST NOT REPORTED DUE TO COLOR INTERFERENCE OF URINE PIGMENT   Squamous Epithelial / HPF 0-5 0 - 5 /HPF   WBC, UA 21-50 0 - 5 WBC/hpf   RBC / HPF FIELD OBSCURED BY RBC'S 0 - 5 RBC/hpf   Bacteria, UA RARE  (A) NONE SEEN  Comprehensive metabolic panel     Status: Abnormal   Collection Time: 07/03/24  4:19 PM  Result Value Ref Range   Sodium 134 (L) 135 - 145 mmol/L   Potassium 4.0 3.5 - 5.1 mmol/L   Chloride 101 98 - 111 mmol/L   CO2 19 (L) 22 - 32 mmol/L   Glucose, Bld 451 (H) 70 - 99 mg/dL   BUN 19 8 - 23 mg/dL   Creatinine, Ser 8.71 (H) 0.44 - 1.00 mg/dL   Calcium  9.5 8.9 - 10.3 mg/dL  Total Protein 7.0 6.5 - 8.1 g/dL   Albumin 3.8 3.5 - 5.0 g/dL   AST 29 15 - 41 U/L   ALT 32 0 - 44 U/L   Alkaline Phosphatase 129 (H) 38 - 126 U/L   Total Bilirubin 0.8 0.0 - 1.2 mg/dL   GFR, Estimated 42 (L) >60 mL/min   Anion gap 14 5 - 15  Lipase, blood     Status: Abnormal   Collection Time: 07/03/24  4:19 PM  Result Value Ref Range   Lipase 60 (H) 11 - 51 U/L  CBC with Diff     Status: Abnormal   Collection Time: 07/03/24  4:19 PM  Result Value Ref Range   WBC 11.7 (H) 4.0 - 10.5 K/uL   RBC 3.93 3.87 - 5.11 MIL/uL   Hemoglobin 12.5 12.0 - 15.0 g/dL   HCT 62.1 63.9 - 53.9 %   MCV 96.2 80.0 - 100.0 fL   MCH 31.8 26.0 - 34.0 pg   MCHC 33.1 30.0 - 36.0 g/dL   RDW 87.9 88.4 - 84.4 %   Platelets 273 150 - 400 K/uL   nRBC 0.0 0.0 - 0.2 %   Neutrophils Relative % 84 %   Neutro Abs 9.8 (H) 1.7 - 7.7 K/uL   Lymphocytes Relative 11 %   Lymphs Abs 1.3 0.7 - 4.0 K/uL   Monocytes Relative 5 %   Monocytes Absolute 0.6 0.1 - 1.0 K/uL   Eosinophils Relative 0 %   Eosinophils Absolute 0.0 0.0 - 0.5 K/uL   Basophils Relative 0 %   Basophils Absolute 0.0 0.0 - 0.1 K/uL   Immature Granulocytes 0 %   Abs Immature Granulocytes 0.04 0.00 - 0.07 K/uL  Blood culture (routine x 2)     Status: None (Preliminary result)   Collection Time: 07/03/24  5:50 PM   Specimen: BLOOD  Result Value Ref Range   Specimen Description BLOOD SITE NOT SPECIFIED    Special Requests      BOTTLES DRAWN AEROBIC AND ANAEROBIC Blood Culture results may not be optimal due to an inadequate volume of blood received in culture  bottles   Culture      NO GROWTH < 12 HOURS Performed at [facility], [address], [place], [place] [address]    Report Status PENDING   Lactic acid, plasma     Status: Abnormal   Collection Time: 07/03/24  7:51 PM  Result Value Ref Range   Lactic Acid, Venous 4.0 (HH) 0.5 - 1.9 mmol/L  MRSA Next Gen by PCR, Nasal     Status: None   Collection Time: 07/03/24 11:31 PM   Specimen: Nasal Mucosa; Nasal Swab  Result Value Ref Range   MRSA by PCR Next Gen NOT DETECTED NOT DETECTED  Glucose, capillary     Status: Abnormal   Collection Time: 07/03/24 11:44 PM  Result Value Ref Range   Glucose-Capillary 267 (H) 70 - 99 mg/dL   Comment 1 Document in Chart   Lactic acid, plasma     Status: None   Collection Time: 07/03/24 11:49 PM  Result Value Ref Range   Lactic Acid, Venous 1.3 0.5 - 1.9 mmol/L  CBC     Status: Abnormal   Collection Time: 07/04/24  2:09 AM  Result Value Ref Range   WBC 10.1 4.0 - 10.5 K/uL   RBC 3.77 (L) 3.87 - 5.11 MIL/uL   Hemoglobin 11.9 (L) 12.0 - 15.0 g/dL   HCT 65.5 (L) 63.9 -  46.0 %   MCV 91.2 80.0 - 100.0 fL   MCH 31.6 26.0 - 34.0 pg   MCHC 34.6 30.0 - 36.0 g/dL   RDW 87.6 88.4 - 84.4 %   Platelets 242 150 - 400 K/uL   nRBC 0.0 0.0 - 0.2 %  Basic metabolic panel     Status: Abnormal   Collection Time: 07/04/24  2:09 AM  Result Value Ref Range   Sodium 138 135 - 145 mmol/L   Potassium 3.0 (L) 3.5 - 5.1 mmol/L   Chloride 107 98 - 111 mmol/L   CO2 23 22 - 32 mmol/L   Glucose, Bld 167 (H) 70 - 99 mg/dL   BUN 15 8 - 23 mg/dL   Creatinine, Ser 9.25 0.44 - 1.00 mg/dL   Calcium  9.2 8.9 - 10.3 mg/dL   GFR, Estimated >39 >39 mL/min   Anion gap 8 5 - 15  Glucose, capillary     Status: Abnormal   Collection Time: 07/04/24  3:51 AM  Result Value Ref Range   Glucose-Capillary 108 (H) 70 - 99 mg/dL   Comment 1 Document in Chart    Recent Results (from the past 240 hours)  Blood culture (routine x 2)     Status: None (Preliminary result)    Collection Time: 07/03/24  5:50 PM   Specimen: BLOOD  Result Value Ref Range Status   Specimen Description BLOOD SITE NOT SPECIFIED  Final   Special Requests   Final    BOTTLES DRAWN AEROBIC AND ANAEROBIC Blood Culture results may not be optimal due to an inadequate volume of blood received in culture bottles   Culture   Final    NO GROWTH < 12 HOURS Performed at [facility], [address] [place], [place] [address]    Report Status PENDING  Incomplete  MRSA Next Gen by PCR, Nasal     Status: None   Collection Time: 07/03/24 11:31 PM   Specimen: Nasal Mucosa; Nasal Swab  Result Value Ref Range Status   MRSA by PCR Next Gen NOT DETECTED NOT DETECTED Final    Comment: (NOTE) The GeneXpert MRSA Assay (FDA approved for NASAL specimens only), is one component of a comprehensive MRSA colonization surveillance program. It is not intended to diagnose MRSA infection nor to guide or monitor treatment for MRSA infections. Test performance is not FDA approved in patients less than 42 years old. Performed at [facility], [address] [place], [place] [address]     Renal Function: Recent Labs    07/03/24 1619 07/04/24 0209  CREATININE 1.28* 0.74   Estimated Creatinine Clearance: 46.6 mL/min (by C-G formula based on SCr of 0.74 mg/dL).  Radiologic Imaging: CT ABDOMEN PELVIS W CONTRAST Result Date: 07/03/2024 CLINICAL DATA:  Two day history of dysuria, urinary frequency, and hematuria. Urinary tract infection EXAM: CT ABDOMEN AND PELVIS WITH CONTRAST TECHNIQUE: Multidetector CT imaging of the abdomen and pelvis was performed using the standard protocol following bolus administration of intravenous contrast. RADIATION DOSE REDUCTION: This exam was performed according to the departmental dose-optimization program which includes automated exposure control, adjustment of the mA and/or kV according to patient size and/or use of iterative reconstruction technique. CONTRAST:  75mL  OMNIPAQUE  IOHEXOL  350 MG/ML SOLN COMPARISON:  CT abdomen and pelvis dated 06/02/2024 FINDINGS: Lower chest: No focal consolidation or pulmonary nodule in the lung bases. No pleural effusion or pneumothorax demonstrated. Partially imaged heart size is normal. Hepatobiliary: Scattered subcentimeter hypodensities, too small to  characterize but likely cysts. No intra or extrahepatic biliary ductal dilation. Normal gallbladder. Pancreas: No focal lesions or main ductal dilation. Spleen: Normal in size without focal abnormality. Adrenals/Urinary Tract: No adrenal nodules. No suspicious renal mass, calculi or hydronephrosis. Unchanged multifocal left renal cysts, many of which are mildly hyperattenuating and likely hemorrhagic/proteinaceous. Multiple additional subcentimeter hypodensities, too small to characterize but also likely cysts. Extensive, circumferential submucosal emphysema. Circumferential mural thickening. Stomach/Bowel: Normal appearance of the stomach. No evidence of bowel wall thickening, distention, or inflammatory changes. Colonic diverticulosis without acute diverticulitis. Normal appendix. Vascular/Lymphatic: Aortic atherosclerosis. No enlarged abdominal or pelvic lymph nodes. Reproductive: No adnexal masses. Other: Small volume free air dissecting through the peritoneal fat surrounding the bladder. A few of the gaseous foci are closely associated with loops of small bowel in the lower abdomen. Musculoskeletal: No acute or abnormal lytic or blastic osseous lesions. Multilevel degenerative changes of the partially imaged thoracic and lumbar spine. IMPRESSION: 1. 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 A few of the gaseous foci are closely associated with loops of small bowel in the lower abdomen and may be intraperitoneal. 2. Colonic diverticulosis without acute diverticulitis. 3.  Aortic Atherosclerosis (ICD10-I70.0). Critical  Value/emergent results were called by telephone at the time of interpretation on 07/03/2024 at 7:24 pm to provider Carlo cornish, who verbally acknowledged these results. Electronically Signed   By: Limin  Xu M.D.   On: 07/03/2024 19:32    I independently reviewed the above imaging studies.  Impression/Recommendation: Emphysematous cystitis:  Her imaging is impressive and out of proportion to her clinical exam.  Most cases of emphysematous cystitis are caused by E coli and/or Klebsiella (consistent with recent culture from 7/10) and will resolve with catheter drainage and antibiotic therapy for 14 days.  She will likely benefit from repeat imaging to ensure radiographic improvement as well as clinical improvement.  I am not sure what to make of the peritoneal air.  I would doubt she has an enterovesical fistula based on her recent cystoscopy report although that may be a possibility.  She currently does not have peritoneal signs and my independent review of her CT suggests a very indeterminate picture.  Will defer to general surgery and can discuss repeat imaging including maybe a CT cystogram at some point once infection is controlled to absolutely rule out a fistula.  Noretta Ferrara 07/04/2024, 6:27 AM  Gretel CANDIE Ferrara Mickey. MD   CC: Dr. Dana

## 2024-07-04 NOTE — Progress Notes (Addendum)
 PROGRESS NOTE    Jacqueline Ball  FMW:[id number]  DOB: [birth year]  DOA: 07/03/2024 PCP: Haze Kingfisher, MD Outpatient Specialists:   Hospital course:   83 y.o. female with medical history significant of paroxysmal A-fib on Eliquis , hypertension, hyperlipidemia, type 2 diabetes, dementia, anxiety presenting with complaints of dysuria, urinary frequency/urgency, and suprapubic discomfort x 1 week.  She has been followed by outpatient urology for hematuria and has had a negative cystoscopy recently.  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  Patient was started on ceftriaxone , metronidazole  and IV fluids.  Patient was seen by general surgery who noted this was likely secondary to cystitis and no further general surgery recommendations were warranted.  Patient was seen by urology who recommended conservative management with Foley catheter and IV antibiotics.   Subjective:  Patient states she feels much much better than on admission yesterday.  Notes she no longer has any pain in her abdomen.   Objective: Vitals:   07/04/24 0000 07/04/24 0355 07/04/24 0806 07/04/24 1100  BP: (!) 140/82 91/66 (!) 108/56 93/66  Pulse: 73 76  68  Resp: 19 20  20   Temp:  98 F (36.7 C) 98 F (36.7 C) 97.9 F (36.6 C)  TempSrc:  Oral Oral Oral  SpO2: 99% 100%  99%  Weight:      Height:        Intake/Output Summary (Last 24 hours) at 07/04/2024 1446 Last data filed at 07/04/2024 0954 Gross per 24 hour  Intake 206.02 ml  Output 575 ml  Net -368.98 ml   Filed Weights   07/03/24 1614 07/03/24 2227  Weight: 54.4 kg 54.4 kg     Exam:  General: Tired appearing female lying in bed in NAD Eyes: sclera anicteric, conjuctiva mild injection bilaterally CVS: S1-S2, regular  Respiratory:  decreased air entry bilaterally secondary to decreased inspiratory effort, rales at bases  GI: NABS, soft, NT   LE: Warm and well-perfused Neuro: A/O x 3,  grossly nonfocal.  Psych: patient is logical and coherent, judgement and insight appear normal, mood and affect appropriate to situation.  Foley bag with scant dark red urine.  Data Reviewed:  Basic Metabolic Panel: Recent Labs  Lab 07/03/24 1619 07/04/24 0209  NA 134* 138  K 4.0 3.0*  CL 101 107  CO2 19* 23  GLUCOSE 451* 167*  BUN 19 15  CREATININE 1.28* 0.74  CALCIUM  9.5 9.2    CBC: Recent Labs  Lab 07/03/24 1619 07/04/24 0209  WBC 11.7* 10.1  NEUTROABS 9.8*  --   HGB 12.5 11.9*  HCT 37.8 34.4*  MCV 96.2 91.2  PLT 273 242     Scheduled Meds:  atorvastatin   20 mg Oral Daily   Chlorhexidine  Gluconate Cloth  6 each Topical Daily   donepezil   10 mg Oral QHS   insulin  aspart  0-9 Units Subcutaneous Q4H   memantine   10 mg Oral BID   pregabalin   100 mg Oral QPM   Continuous Infusions:  cefTRIAXone  (ROCEPHIN )  IV     lactated ringers  125 mL/hr at 07/04/24 1215   metronidazole  100 mL/hr at 07/04/24 0603     Assessment & Plan:   Acute emphysematous cystitis Known history of Klebsiella UTI Pneumoperitoneum Hematuria Appreciate urology consultation and ongoing follow-up Appreciate general surgery consultation, no further workup warranted, they have signed off Continue decompression with Foley catheter Continue ceftriaxone  and metronidazole  day #2, Blood and urine cultures  pending Eliquis  being held till hematuria is resolved  AKI Resolved with fluid resuscitation Continue LR at 125 cc an hour  Hypokalemia Will replete and recheck  HTN Blood pressures a bit soft likely secondary to intravascular volume depletion Will provide 500 cc IV bolus and continue fluid resuscitation as above  DM 2 Under good control on present regimen  A-fib Patient is not on any rate control medications at home Eliquis  being held as noted above for hematuria  Dementia Continue Aricept  and Namenda   Chronic  pain/neuropathy Continue Lyrica    DVT prophylaxis: SCD Code Status: Full Family Communication: None today     Studies: CT ABDOMEN PELVIS W CONTRAST Result Date: 07/03/2024 CLINICAL DATA:  Two day history of dysuria, urinary frequency, and hematuria. Urinary tract infection EXAM: CT ABDOMEN AND PELVIS WITH CONTRAST TECHNIQUE: Multidetector CT imaging of the abdomen and pelvis was performed using the standard protocol following bolus administration of intravenous contrast. RADIATION DOSE REDUCTION: This exam was performed according to the departmental dose-optimization program which includes automated exposure control, adjustment of the mA and/or kV according to patient size and/or use of iterative reconstruction technique. CONTRAST:  75mL OMNIPAQUE  IOHEXOL  350 MG/ML SOLN COMPARISON:  CT abdomen and pelvis dated 06/02/2024 FINDINGS: Lower chest: No focal consolidation or pulmonary nodule in the lung bases. No pleural effusion or pneumothorax demonstrated. Partially imaged heart size is normal. Hepatobiliary: Scattered subcentimeter hypodensities, too small to characterize but likely cysts. No intra or extrahepatic biliary ductal dilation. Normal gallbladder. Pancreas: No focal lesions or main ductal dilation. Spleen: Normal in size without focal abnormality. Adrenals/Urinary Tract: No adrenal nodules. No suspicious renal mass, calculi or hydronephrosis. Unchanged multifocal left renal cysts, many of which are mildly hyperattenuating and likely hemorrhagic/proteinaceous. Multiple additional subcentimeter hypodensities, too small to characterize but also likely cysts. Extensive, circumferential submucosal emphysema. Circumferential mural thickening. Stomach/Bowel: Normal appearance of the stomach. No evidence of bowel wall thickening, distention, or inflammatory changes. Colonic diverticulosis without acute diverticulitis. Normal appendix. Vascular/Lymphatic: Aortic atherosclerosis. No enlarged abdominal or  pelvic lymph nodes. Reproductive: No adnexal masses. Other: Small volume free air dissecting through the peritoneal fat surrounding the bladder. A few of the gaseous foci are closely associated with loops of small bowel in the lower abdomen. Musculoskeletal: No acute or abnormal lytic or blastic osseous lesions. Multilevel degenerative changes of the partially imaged thoracic and lumbar spine. IMPRESSION: 1. 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 A few of the gaseous foci are closely associated with loops of small bowel in the lower abdomen and may be intraperitoneal. 2. Colonic diverticulosis without acute diverticulitis. 3.  Aortic Atherosclerosis (ICD10-I70.0). Critical Value/emergent results were called by telephone at the time of interpretation on 07/03/2024 at 7:24 pm to provider Carlo cornish, who verbally acknowledged these results. Electronically Signed   By: Limin  Xu M.D.   On: 07/03/2024 19:32    Principal Problem:   Emphysematous cystitis Active Problems:   Essential hypertension   Late onset Alzheimer's disease without behavioral disturbance (HCC)   AKI (acute kidney injury) (HCC)   Hyponatremia     Eilis Chestnutt Vangie Pike, [facility]  If 7PM-7AM, please contact night-coverage [email or web address]   LOS: 1 day

## 2024-07-04 NOTE — Progress Notes (Signed)
 CT reviewed, low suspicion for surgical abdomen. Noted initial LA of 4, after resuscitation has normalized, further supporting this. Continue supportive care, full consult note to follow.   Dreama GEANNIE Hanger, MD General and Trauma Surgery [facility]

## 2024-07-05 LAB — GLUCOSE, CAPILLARY
Glucose-Capillary: 250 mg/dL — ABNORMAL HIGH (ref 70–99)
Glucose-Capillary: 258 mg/dL — ABNORMAL HIGH (ref 70–99)
Glucose-Capillary: 272 mg/dL — ABNORMAL HIGH (ref 70–99)
Glucose-Capillary: 298 mg/dL — ABNORMAL HIGH (ref 70–99)
Glucose-Capillary: 314 mg/dL — ABNORMAL HIGH (ref 70–99)
Glucose-Capillary: 324 mg/dL — ABNORMAL HIGH (ref 70–99)

## 2024-07-05 LAB — BASIC METABOLIC PANEL WITH GFR
Anion gap: 8 (ref 5–15)
BUN: 19 mg/dL (ref 8–23)
CO2: 22 mmol/L (ref 22–32)
Calcium: 8.6 mg/dL — ABNORMAL LOW (ref 8.9–10.3)
Chloride: 105 mmol/L (ref 98–111)
Creatinine, Ser: 0.9 mg/dL (ref 0.44–1.00)
GFR, Estimated: >60 mL/min (ref >60)
Glucose, Bld: 260 mg/dL — ABNORMAL HIGH (ref 70–99)
Potassium: 3.5 mmol/L (ref 3.5–5.1)
Sodium: 135 mmol/L (ref 135–145)

## 2024-07-05 LAB — CBC
HCT: 30.2 % — ABNORMAL LOW (ref 36.0–46.0)
Hemoglobin: 10.1 g/dL — ABNORMAL LOW (ref 12.0–15.0)
MCH: 31.2 pg (ref 26.0–34.0)
MCHC: 33.4 g/dL (ref 30.0–36.0)
MCV: 93.2 fL (ref 80.0–100.0)
Platelets: 213 K/uL (ref 150–400)
RBC: 3.24 MIL/uL — ABNORMAL LOW (ref 3.87–5.11)
RDW: 12.5 % (ref 11.5–15.5)
WBC: 8.1 K/uL (ref 4.0–10.5)
nRBC: 0 % (ref 0.0–0.2)

## 2024-07-05 MED ORDER — INSULIN ASPART 100 UNIT/ML IJ SOLN
0.0000 [IU] | Freq: Every day | INTRAMUSCULAR | Status: DC
  Administered 2024-07-05 – 2024-07-06 (×2): 3 [IU] via SUBCUTANEOUS

## 2024-07-05 MED ORDER — INSULIN ASPART 100 UNIT/ML IJ SOLN
0.0000 [IU] | Freq: Three times a day (TID) | INTRAMUSCULAR | Status: DC
  Administered 2024-07-05: 7 [IU] via SUBCUTANEOUS
  Administered 2024-07-06: 11 [IU] via SUBCUTANEOUS
  Administered 2024-07-06: 15 [IU] via SUBCUTANEOUS
  Administered 2024-07-07: 4 [IU] via SUBCUTANEOUS
  Administered 2024-07-07: 15 [IU] via SUBCUTANEOUS

## 2024-07-05 MED ORDER — METFORMIN HCL 500 MG PO TABS
750.0000 mg | ORAL_TABLET | Freq: Every day | ORAL | Status: DC
  Administered 2024-07-06 – 2024-07-07 (×2): 750 mg via ORAL
  Filled 2024-07-05 (×2): qty 2

## 2024-07-05 NOTE — Progress Notes (Signed)
 Mobility Specialist Progress Note;   07/05/24 0924  Mobility  Activity Ambulated independently in hallway;Ambulated with assistance in room  Level of Assistance Contact guard assist, steadying assist  Assistive Device None  Distance Ambulated (ft) 50 ft  Activity Response Tolerated well  Mobility Referral Yes  Mobility visit 1 Mobility  Mobility Specialist Start Time (ACUTE ONLY) [id number]  Mobility Specialist Stop Time (ACUTE ONLY) 0936  Mobility Specialist Time Calculation (min) (ACUTE ONLY) 12 min   Pt agreeable to mobility. Required MinG assistance during ambulation for safety. BP more stable today. Only c/o lower stomach pain, otherwise asx. Pt returned back to bed with all needs met, call bell in reach. Alarm on.   Lauraine Erm Mobility Specialist Please contact via SecureChat or [employer] [phone]

## 2024-07-05 NOTE — Progress Notes (Signed)
 Subjective: Denies pain. Tolerating foley. Afebrile.  Objective: Vital signs in last 24 hours: Temp:  [97.6 F (36.4 C)-98.1 F (36.7 C)] 97.9 F (36.6 C) (07/20 0337) Pulse Rate:  [68-75] 75 (07/20 0337) Resp:  [16-20] 19 (07/20 0337) BP: (93-115)/(56-70) 115/70 (07/20 0337) SpO2:  [97 %-99 %] 97 % (07/20 0337)  Intake/Output from previous day: 07/19 0701 - 07/20 0700 In: 1451.6 [P.O.:240; I.V.:869.8; IV Piggyback:341.8] Out: 3300 [Urine:3300] Intake/Output this shift: No intake/output data recorded. Urine clear yellow, 3.3L   Physical Exam:  General: Alert and oriented CV: RRR Lungs: Clear Abdomen: Soft, ND, NT Ext: NT, No erythema  Lab Results: Recent Labs    07/03/24 1619 07/04/24 0209 07/05/24 0256  HGB 12.5 11.9* 10.1*  HCT 37.8 34.4* 30.2*   BMET Recent Labs    07/04/24 0209 07/05/24 0256  NA 138 135  K 3.0* 3.5  CL 107 105  CO2 23 22  GLUCOSE 167* 260*  BUN 15 19  CREATININE 0.74 0.90  CALCIUM  9.2 8.6*     Studies/Results: CT ABDOMEN PELVIS W CONTRAST Result Date: 07/03/2024 CLINICAL DATA:  Two day history of dysuria, urinary frequency, and hematuria. Urinary tract infection EXAM: CT ABDOMEN AND PELVIS WITH CONTRAST TECHNIQUE: Multidetector CT imaging of the abdomen and pelvis was performed using the standard protocol following bolus administration of intravenous contrast. RADIATION DOSE REDUCTION: This exam was performed according to the departmental dose-optimization program which includes automated exposure control, adjustment of the mA and/or kV according to patient size and/or use of iterative reconstruction technique. CONTRAST:  75mL OMNIPAQUE  IOHEXOL  350 MG/ML SOLN COMPARISON:  CT abdomen and pelvis dated 06/02/2024 FINDINGS: Lower chest: No focal consolidation or pulmonary nodule in the lung bases. No pleural effusion or pneumothorax demonstrated. Partially imaged heart size is normal. Hepatobiliary: Scattered subcentimeter hypodensities, too  small to characterize but likely cysts. No intra or extrahepatic biliary ductal dilation. Normal gallbladder. Pancreas: No focal lesions or main ductal dilation. Spleen: Normal in size without focal abnormality. Adrenals/Urinary Tract: No adrenal nodules. No suspicious renal mass, calculi or hydronephrosis. Unchanged multifocal left renal cysts, many of which are mildly hyperattenuating and likely hemorrhagic/proteinaceous. Multiple additional subcentimeter hypodensities, too small to characterize but also likely cysts. Extensive, circumferential submucosal emphysema. Circumferential mural thickening. Stomach/Bowel: Normal appearance of the stomach. No evidence of bowel wall thickening, distention, or inflammatory changes. Colonic diverticulosis without acute diverticulitis. Normal appendix. Vascular/Lymphatic: Aortic atherosclerosis. No enlarged abdominal or pelvic lymph nodes. Reproductive: No adnexal masses. Other: Small volume free air dissecting through the peritoneal fat surrounding the bladder. A few of the gaseous foci are closely associated with loops of small bowel in the lower abdomen. Musculoskeletal: No acute or abnormal lytic or blastic osseous lesions. Multilevel degenerative changes of the partially imaged thoracic and lumbar spine. IMPRESSION: 1. 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 A few of the gaseous foci are closely associated with loops of small bowel in the lower abdomen and may be intraperitoneal. 2. Colonic diverticulosis without acute diverticulitis. 3.  Aortic Atherosclerosis (ICD10-I70.0). Critical Value/emergent results were called by telephone at the time of interpretation on 07/03/2024 at 7:24 pm to provider Carlo cornish, who verbally acknowledged these results. Electronically Signed   By: Limin  Xu M.D.   On: 07/03/2024 19:32    Assessment/Plan: Emphysematous cystitis  -Continue foley  catheter -Continue antibiotics -Would benefit from outpatient CT cystogram later this week once infection controlled to rule out colovesicular fistula    LOS:  2 days   Matt R. Ndeye Tenorio MD 07/05/2024, 7:34 AM [facility]  Pager: [phone]

## 2024-07-05 NOTE — Plan of Care (Signed)
  Problem: Education: Goal: Knowledge of General Education information will improve Description: Including pain rating scale, medication(s)/side effects and non-pharmacologic comfort measures Outcome: Progressing   Problem: Clinical Measurements: Goal: Ability to maintain clinical measurements within normal limits will improve Outcome: Progressing Goal: Respiratory complications will improve Outcome: Progressing Goal: Cardiovascular complication will be avoided Outcome: Progressing   Problem: Elimination: Goal: Will not experience complications related to bowel motility Outcome: Progressing Goal: Will not experience complications related to urinary retention Outcome: Progressing   Problem: Pain Managment: Goal: General experience of comfort will improve and/or be controlled Outcome: Progressing

## 2024-07-05 NOTE — Progress Notes (Addendum)
 PROGRESS NOTE    Jacqueline Ball  FMW:[id number] DOB: [birth year] DOA: 07/03/2024 PCP: Haze Kingfisher, MD   Brief Narrative:  83 y.o. female with medical history significant of paroxysmal A-fib on Eliquis , hypertension, hyperlipidemia, type 2 diabetes, dementia, anxiety presenting with complaints of dysuria, urinary frequency/urgency, and suprapubic discomfort x 1 week.  She has been followed by outpatient urology for hematuria and has had a negative cystoscopy recently.  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  Patient was started on ceftriaxone , metronidazole  and IV fluids.  Patient was seen by general surgery who noted this was likely secondary to cystitis and no further general surgery recommendations were warranted.  Patient was seen by urology who recommended conservative management with Foley catheter and IV antibiotics.    Assessment & Plan:  Acute emphysematous cystitis likely in the setting of recent Klebsiella UTI, POA: Patient is afebrile and there is no evidence of leukocytosis now.  Continue with intravenous ceftriaxone .  Urology on board.  No urological intervention needs to be done in the hospital.  Foley's catheter in place.  Patient will likely need outpatient CT cystogram. Follow-up blood cultures.  Pneumoperitoneum: Likely related to cystitis. Surgical team on board. No surgical intervention needed.  Acute kidney injury, POA: Resolved with intravenous fluids.  Hypokalemia: As needed repletion  Essential hypertension: Continue with close monitoring  Type 2 diabetes mellitus with hyperglycemia, POA: Continue with current management with close monitoring of blood glucose levels Continue metformin  and SSI F/u HbA1c  Longstanding persistent atrial fibrillation, POA: Rate controlled.  Holding Eliquis  in the setting of hematuria.  May be resumed once hematuria completely  resolves  Alzheimer's dementia, POA: No acute issues.  Continue with Aricept  and Namenda   Chronic neuropathic pain, POA: Continue with Lyrica   Disposition: Home with family.  DVT prophylaxis: SCDs Start: 07/03/24 2216     Code Status: Full Code Family Communication:   Status is: Inpatient Remains inpatient appropriate because: Emphysematous cystitis    Subjective:  Patient is resting comfortably in the bed.  She denies any active complaints.  She denies abdominal pain.  Foley's catheter in place.  Urology is on board.  Examination:  General exam: Appears calm and comfortable  Respiratory system: Clear to auscultation. Respiratory effort normal. Cardiovascular system: S1 & S2 heard, RRR. No JVD, murmurs, rubs, gallops or clicks. No pedal edema. Gastrointestinal system: Abdomen is nondistended, soft and nontender. No organomegaly or masses felt. Normal bowel sounds heard. Central nervous system: Alert and oriented. No focal neurological deficits. Extremities: Symmetric 5 x 5 power. Skin: No rashes, lesions or ulcers Psychiatry: Judgement and insight appear normal. Mood & affect appropriate. Genitourinary: Foley's catheter in place, clear urine in the urine bag      Diet Orders (From admission, onward)     Start     Ordered   07/04/24 1012  Diet Carb Modified Fluid consistency: Thin; Room service appropriate? Yes  Diet effective now       Question Answer Comment  Diet-HS Snack? Nothing   Calorie Level Medium 1600-2000   Fluid consistency: Thin   Room service appropriate? Yes      07/04/24 1011            Objective: Vitals:   07/04/24 2318 07/05/24 0337 07/05/24 0749 07/05/24 0759  BP: 112/62 115/70 111/81 111/81  Pulse: 74 75 62 62  Resp:  19 19 10   Temp: 97.6 F (36.4 C) 97.9 F (36.6  C) 98 F (36.7 C) 97.8 F (36.6 C)  TempSrc: Oral Oral Oral Oral  SpO2: 97% 97% 99% 99%  Weight:      Height:        Intake/Output Summary (Last 24 hours) at  07/05/2024 0912 Last data filed at 07/05/2024 0700 Gross per 24 hour  Intake 1451.57 ml  Output 3300 ml  Net -1848.43 ml   Filed Weights   07/03/24 1614 07/03/24 2227  Weight: 54.4 kg 54.4 kg    Scheduled Meds:  atorvastatin   20 mg Oral Daily   Chlorhexidine  Gluconate Cloth  6 each Topical Daily   donepezil   10 mg Oral QHS   insulin  aspart  0-9 Units Subcutaneous Q4H   memantine   10 mg Oral BID   pregabalin   100 mg Oral QPM   Continuous Infusions:  cefTRIAXone  (ROCEPHIN )  IV Stopped (07/04/24 1942)   lactated ringers  125 mL/hr at 07/05/24 0700   metronidazole  100 mL/hr at 07/05/24 0700    Nutritional status     Body mass index is 19.97 kg/m.  Data Reviewed:   CBC: Recent Labs  Lab 07/03/24 1619 07/04/24 0209 07/05/24 0256  WBC 11.7* 10.1 8.1  NEUTROABS 9.8*  --   --   HGB 12.5 11.9* 10.1*  HCT 37.8 34.4* 30.2*  MCV 96.2 91.2 93.2  PLT 273 242 213   Basic Metabolic Panel: Recent Labs  Lab 07/03/24 1619 07/04/24 0209 07/05/24 0256  NA 134* 138 135  K 4.0 3.0* 3.5  CL 101 107 105  CO2 19* 23 22  GLUCOSE 451* 167* 260*  BUN 19 15 19   CREATININE 1.28* 0.74 0.90  CALCIUM  9.5 9.2 8.6*   GFR: Estimated Creatinine Clearance: 41.4 mL/min (by C-G formula based on SCr of 0.9 mg/dL). Liver Function Tests: Recent Labs  Lab 07/03/24 1619  AST 29  ALT 32  ALKPHOS 129*  BILITOT 0.8  PROT 7.0  ALBUMIN 3.8   Recent Labs  Lab 07/03/24 1619  LIPASE 60*   No results for input(s): AMMONIA in the last 168 hours. Coagulation Profile: No results for input(s): INR, PROTIME in the last 168 hours. Cardiac Enzymes: No results for input(s): CKTOTAL, CKMB, CKMBINDEX, TROPONINI in the last 168 hours. BNP (last 3 results) No results for input(s): PROBNP in the last 8760 hours. HbA1C: No results for input(s): HGBA1C in the last 72 hours. CBG: Recent Labs  Lab 07/04/24 1602 07/04/24 2016 07/04/24 2325 07/05/24 0341 07/05/24 0758  GLUCAP  271* 225* 283* 250* 272*   Lipid Profile: No results for input(s): CHOL, HDL, LDLCALC, TRIG, CHOLHDL, LDLDIRECT in the last 72 hours. Thyroid  Function Tests: No results for input(s): TSH, T4TOTAL, FREET4, T3FREE, THYROIDAB in the last 72 hours. Anemia Panel: No results for input(s): VITAMINB12, FOLATE, FERRITIN, TIBC, IRON, RETICCTPCT in the last 72 hours. Sepsis Labs: Recent Labs  Lab 07/03/24 1951 07/03/24 2349  LATICACIDVEN 4.0* 1.3    Recent Results (from the past 240 hours)  Blood culture (routine x 2)     Status: None (Preliminary result)   Collection Time: 07/03/24  5:50 PM   Specimen: BLOOD  Result Value Ref Range Status   Specimen Description BLOOD SITE NOT SPECIFIED  Final   Special Requests   Final    BOTTLES DRAWN AEROBIC AND ANAEROBIC Blood Culture results may not be optimal due to an inadequate volume of blood received in culture bottles   Culture   Final    NO GROWTH 2 DAYS Performed at [facility]  Lab, [address], [place], [place] [address]    Report Status PENDING  Incomplete  MRSA Next Gen by PCR, Nasal     Status: None   Collection Time: 07/03/24 11:31 PM   Specimen: Nasal Mucosa; Nasal Swab  Result Value Ref Range Status   MRSA by PCR Next Gen NOT DETECTED NOT DETECTED Final    Comment: (NOTE) The GeneXpert MRSA Assay (FDA approved for NASAL specimens only), is one component of a comprehensive MRSA colonization surveillance program. It is not intended to diagnose MRSA infection nor to guide or monitor treatment for MRSA infections. Test performance is not FDA approved in patients less than 36 years old. Performed at [facility], [address], [place], [place] [address]   Blood culture (routine x 2)     Status: None (Preliminary result)   Collection Time: 07/03/24 11:49 PM   Specimen: BLOOD LEFT ARM  Result Value Ref Range Status   Specimen Description BLOOD LEFT ARM  Final   Special Requests   Final     BOTTLES DRAWN AEROBIC AND ANAEROBIC Blood Culture adequate volume   Culture   Final    NO GROWTH 1 DAY Performed at [facility] Lab, [address] [place], [place] [address]    Report Status PENDING  Incomplete         Radiology Studies: CT ABDOMEN PELVIS W CONTRAST Result Date: 07/03/2024 CLINICAL DATA:  Two day history of dysuria, urinary frequency, and hematuria. Urinary tract infection EXAM: CT ABDOMEN AND PELVIS WITH CONTRAST TECHNIQUE: Multidetector CT imaging of the abdomen and pelvis was performed using the standard protocol following bolus administration of intravenous contrast. RADIATION DOSE REDUCTION: This exam was performed according to the departmental dose-optimization program which includes automated exposure control, adjustment of the mA and/or kV according to patient size and/or use of iterative reconstruction technique. CONTRAST:  75mL OMNIPAQUE  IOHEXOL  350 MG/ML SOLN COMPARISON:  CT abdomen and pelvis dated 06/02/2024 FINDINGS: Lower chest: No focal consolidation or pulmonary nodule in the lung bases. No pleural effusion or pneumothorax demonstrated. Partially imaged heart size is normal. Hepatobiliary: Scattered subcentimeter hypodensities, too small to characterize but likely cysts. No intra or extrahepatic biliary ductal dilation. Normal gallbladder. Pancreas: No focal lesions or main ductal dilation. Spleen: Normal in size without focal abnormality. Adrenals/Urinary Tract: No adrenal nodules. No suspicious renal mass, calculi or hydronephrosis. Unchanged multifocal left renal cysts, many of which are mildly hyperattenuating and likely hemorrhagic/proteinaceous. Multiple additional subcentimeter hypodensities, too small to characterize but also likely cysts. Extensive, circumferential submucosal emphysema. Circumferential mural thickening. Stomach/Bowel: Normal appearance of the stomach. No evidence of bowel wall thickening, distention, or inflammatory changes. Colonic  diverticulosis without acute diverticulitis. Normal appendix. Vascular/Lymphatic: Aortic atherosclerosis. No enlarged abdominal or pelvic lymph nodes. Reproductive: No adnexal masses. Other: Small volume free air dissecting through the peritoneal fat surrounding the bladder. A few of the gaseous foci are closely associated with loops of small bowel in the lower abdomen. Musculoskeletal: No acute or abnormal lytic or blastic osseous lesions. Multilevel degenerative changes of the partially imaged thoracic and lumbar spine. IMPRESSION: 1. 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 A few of the gaseous foci are closely associated with loops of small bowel in the lower abdomen and may be intraperitoneal. 2. Colonic diverticulosis without acute diverticulitis. 3.  Aortic Atherosclerosis (ICD10-I70.0). Critical Value/emergent results were called by telephone at the time of interpretation on 07/03/2024 at 7:24 pm to  provider Carlo cornish, who verbally acknowledged these results. Electronically Signed   By: Limin  Xu M.D.   On: 07/03/2024 19:32      LOS: 2 days   Time spent= 42 mins  Deliliah Room, MD [facility]  If 7PM-7AM, please contact night-coverage  07/05/2024, 9:12 AM

## 2024-07-06 ENCOUNTER — Telehealth (HOSPITAL_COMMUNITY): Payer: Self-pay | Admitting: Pharmacy Technician

## 2024-07-06 ENCOUNTER — Other Ambulatory Visit (HOSPITAL_COMMUNITY): Payer: Self-pay

## 2024-07-06 LAB — GLUCOSE, CAPILLARY
Glucose-Capillary: 330 mg/dL — ABNORMAL HIGH (ref 70–99)
Glucose-Capillary: 290 mg/dL — ABNORMAL HIGH (ref 70–99)
Glucose-Capillary: 304 mg/dL — ABNORMAL HIGH (ref 70–99)
Glucose-Capillary: 64 mg/dL — ABNORMAL LOW (ref 70–99)
Glucose-Capillary: 100 mg/dL — ABNORMAL HIGH (ref 70–99)
Glucose-Capillary: 285 mg/dL — ABNORMAL HIGH (ref 70–99)

## 2024-07-06 LAB — CBC WITH DIFFERENTIAL/PLATELET
Abs Immature Granulocytes: 0.02 K/uL (ref 0.00–0.07)
Basophils Absolute: 0 K/uL (ref 0.0–0.1)
Basophils Relative: 0 %
Eosinophils Absolute: 0.1 K/uL (ref 0.0–0.5)
Eosinophils Relative: 1 %
HCT: 30.3 % — ABNORMAL LOW (ref 36.0–46.0)
Hemoglobin: 10.2 g/dL — ABNORMAL LOW (ref 12.0–15.0)
Immature Granulocytes: 0 %
Lymphocytes Relative: 26 %
Lymphs Abs: 2 K/uL (ref 0.7–4.0)
MCH: 31.5 pg (ref 26.0–34.0)
MCHC: 33.7 g/dL (ref 30.0–36.0)
MCV: 93.5 fL (ref 80.0–100.0)
Monocytes Absolute: 0.7 K/uL (ref 0.1–1.0)
Monocytes Relative: 9 %
Neutro Abs: 4.8 K/uL (ref 1.7–7.7)
Neutrophils Relative %: 64 %
Platelets: 205 K/uL (ref 150–400)
RBC: 3.24 MIL/uL — ABNORMAL LOW (ref 3.87–5.11)
RDW: 12.4 % (ref 11.5–15.5)
WBC: 7.6 K/uL (ref 4.0–10.5)
nRBC: 0.7 % — ABNORMAL HIGH (ref 0.0–0.2)

## 2024-07-06 LAB — HEMOGLOBIN A1C
Hgb A1c MFr Bld: 11.5 % — ABNORMAL HIGH (ref 4.8–5.6)
Mean Plasma Glucose: 283.35 mg/dL

## 2024-07-06 LAB — BASIC METABOLIC PANEL WITH GFR
Anion gap: 9 (ref 5–15)
BUN: 12 mg/dL (ref 8–23)
CO2: 23 mmol/L (ref 22–32)
Calcium: 8.6 mg/dL — ABNORMAL LOW (ref 8.9–10.3)
Chloride: 105 mmol/L (ref 98–111)
Creatinine, Ser: 0.84 mg/dL (ref 0.44–1.00)
GFR, Estimated: >60 mL/min (ref >60)
Glucose, Bld: 368 mg/dL — ABNORMAL HIGH (ref 70–99)
Potassium: 4 mmol/L (ref 3.5–5.1)
Sodium: 137 mmol/L (ref 135–145)

## 2024-07-06 MED ORDER — METRONIDAZOLE 500 MG PO TABS
500.0000 mg | ORAL_TABLET | Freq: Two times a day (BID) | ORAL | Status: DC
  Administered 2024-07-06 – 2024-07-07 (×2): 500 mg via ORAL
  Filled 2024-07-06 (×2): qty 1

## 2024-07-06 MED ORDER — INSULIN ASPART 100 UNIT/ML IJ SOLN
3.0000 [IU] | Freq: Three times a day (TID) | INTRAMUSCULAR | Status: DC
  Administered 2024-07-06 – 2024-07-07 (×2): 3 [IU] via SUBCUTANEOUS

## 2024-07-06 MED ORDER — INSULIN GLARGINE-YFGN 100 UNIT/ML ~~LOC~~ SOLN
12.0000 [IU] | Freq: Once | SUBCUTANEOUS | Status: AC
  Administered 2024-07-06: 12 [IU] via SUBCUTANEOUS
  Filled 2024-07-06: qty 0.12

## 2024-07-06 MED ORDER — INSULIN GLARGINE-YFGN 100 UNIT/ML ~~LOC~~ SOLN
12.0000 [IU] | Freq: Every day | SUBCUTANEOUS | Status: DC
  Filled 2024-07-06: qty 0.12

## 2024-07-06 MED ORDER — INSULIN GLARGINE-YFGN 100 UNIT/ML ~~LOC~~ SOLN
12.0000 [IU] | Freq: Every day | SUBCUTANEOUS | Status: DC
  Administered 2024-07-07: 12 [IU] via SUBCUTANEOUS
  Filled 2024-07-06: qty 0.12

## 2024-07-06 NOTE — Plan of Care (Signed)
  Problem: Education: Goal: Knowledge of General Education information will improve Description: Including pain rating scale, medication(s)/side effects and non-pharmacologic comfort measures Outcome: Progressing   Problem: Health Behavior/Discharge Planning: Goal: Ability to manage health-related needs will improve Outcome: Progressing   Problem: Clinical Measurements: Goal: Ability to maintain clinical measurements within normal limits will improve Outcome: Progressing Goal: Diagnostic test results will improve Outcome: Progressing Goal: Cardiovascular complication will be avoided Outcome: Progressing   Problem: Activity: Goal: Risk for activity intolerance will decrease Outcome: Progressing   Problem: Nutrition: Goal: Adequate nutrition will be maintained Outcome: Progressing   Problem: Elimination: Goal: Will not experience complications related to bowel motility Outcome: Progressing   Problem: Safety: Goal: Ability to remain free from injury will improve Outcome: Progressing

## 2024-07-06 NOTE — Telephone Encounter (Signed)
 Pharmacy Patient Advocate Encounter  Insurance verification completed.    The patient is insured through [employer]. Patient has Medicare and is not eligible for a copay card, but may be able to apply for patient assistance or Medicare RX Payment Plan (Patient Must reach out to their plan, if eligible for payment plan), if available.    Ran test claim for Novolog  Flexpen and the current 30 day co-pay is $12.15.  Ran test claim for Lantus  Solostar and the current 30 day co-pay is $12.15.   This test claim was processed through [facility]- copay amounts may vary at other pharmacies due to pharmacy/plan contracts, or as the patient moves through the different stages of their insurance plan.

## 2024-07-06 NOTE — Plan of Care (Signed)
  Problem: Education: Goal: Knowledge of General Education information will improve Description: Including pain rating scale, medication(s)/side effects and non-pharmacologic comfort measures Outcome: Progressing   Problem: Health Behavior/Discharge Planning: Goal: Ability to manage health-related needs will improve Outcome: Progressing   Problem: Clinical Measurements: Goal: Ability to maintain clinical measurements within normal limits will improve Outcome: Progressing Goal: Will remain free from infection Outcome: Progressing Goal: Diagnostic test results will improve Outcome: Progressing Goal: Respiratory complications will improve Outcome: Progressing Goal: Cardiovascular complication will be avoided Outcome: Progressing   Problem: Activity: Goal: Risk for activity intolerance will decrease Outcome: Progressing   Problem: Nutrition: Goal: Adequate nutrition will be maintained Outcome: Progressing   Problem: Coping: Goal: Level of anxiety will decrease Outcome: Progressing   Problem: Elimination: Goal: Will not experience complications related to bowel motility Outcome: Progressing Goal: Will not experience complications related to urinary retention Outcome: Progressing   Problem: Pain Managment: Goal: General experience of comfort will improve and/or be controlled Outcome: Progressing   Problem: Safety: Goal: Ability to remain free from injury will improve Outcome: Progressing   Problem: Skin Integrity: Goal: Risk for impaired skin integrity will decrease Outcome: Progressing   Problem: Education: Goal: Ability to describe self-care measures that may prevent or decrease complications (Diabetes Survival Skills Education) will improve Outcome: Progressing Goal: Individualized Educational Video(s) Outcome: Progressing   Problem: Coping: Goal: Ability to adjust to condition or change in health will improve Outcome: Progressing   Problem: Fluid  Volume: Goal: Ability to maintain a balanced intake and output will improve Outcome: Progressing   Problem: Health Behavior/Discharge Planning: Goal: Ability to identify and utilize available resources and services will improve Outcome: Progressing Goal: Ability to manage health-related needs will improve Outcome: Progressing   Problem: Metabolic: Goal: Ability to maintain appropriate glucose levels will improve Outcome: Progressing   Problem: Nutritional: Goal: Maintenance of adequate nutrition will improve Outcome: Progressing Goal: Progress toward achieving an optimal weight will improve Outcome: Progressing   Problem: Skin Integrity: Goal: Risk for impaired skin integrity will decrease Outcome: Progressing

## 2024-07-06 NOTE — TOC CM/SW Note (Signed)
 Transition of Care [facility]) - Inpatient Brief Assessment   Patient Details  Name: Jacqueline Ball MRN: [id number] Date of Birth: 1941-07-12  Transition of Care [facility]) CM/SW Contact:    Lauraine FORBES Saa, LCSW Phone Number: 07/06/2024, 9:22 AM   Clinical Narrative:  9:22 AM Per chart review, patient resides at home with child(ren). Patient has a PCP and insurance. Patient does not have SNF or DME history. Patient has HH history with [facility] and [facility]. Patient's preferred pharmacy's are [facility] [address] and [facility] [address] TOC Consulted as placed for HH/DME. TOC will continue to follow and be available to assist.  Transition of Care Asessment: Insurance and Status: Insurance coverage has been reviewed Patient has primary care physician: Yes Home environment has been reviewed: Private Residence Prior level of function:: N/A Prior/Current Home Services: No current home services (Has HH/DME history) Social Drivers of Health Review: SDOH reviewed no interventions necessary Readmission risk has been reviewed: Yes Transition of care needs: transition of care needs identified, TOC will continue to follow

## 2024-07-06 NOTE — Care Management Important Message (Signed)
 Important Message  Patient Details  Name: Minah Axelrod MRN: [id number] Date of Birth: [birth year]   Important Message Given:  Yes - Medicare IM     Claretta Deed 07/06/2024, 3:38 PM

## 2024-07-06 NOTE — Progress Notes (Signed)
 PROGRESS NOTE    Jacqueline Ball  FMW:[id number] DOB: May 30, 1941 DOA: 07/03/2024 PCP: Haze Kingfisher, MD   Brief Narrative:  83 y.o. female with medical history significant of paroxysmal A-fib on Eliquis , hypertension, hyperlipidemia, type 2 diabetes, dementia, anxiety presenting with complaints of dysuria, urinary frequency/urgency, and suprapubic discomfort x 1 week.  She has been followed by outpatient urology for hematuria and has had a negative cystoscopy recently.  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  Patient was started on ceftriaxone , metronidazole  and IV fluids.  Patient was seen by general surgery who noted this was likely secondary to cystitis and no further general surgery recommendations were warranted.  Patient was seen by urology who recommended conservative management with Foley catheter and IV antibiotics.    Assessment & Plan:  Acute emphysematous cystitis likely in the setting of recent Klebsiella UTI, POA: Patient is afebrile and there is no evidence of leukocytosis now.  Continue with intravenous ceftriaxone .  Urology on board.  No urological intervention needs to be done in the hospital.  Foley's catheter in place.  Patient will need outpatient CT cystogram to rule out a fistula before removal of foley catheter. Follow-up blood cultures-NGTD. Discussed case with Ole NP Urology and plan is to keep foley in place on discharge to help with the healing process of cystitis.  Pneumoperitoneum: Likely related to cystitis. Surgical team on board. No surgical intervention needed.  Acute kidney injury, POA: Resolved with intravenous fluids.  Hypokalemia: As needed repletion  Essential hypertension: Continue with close monitoring  Type 2 diabetes mellitus with hyperglycemia,uncontrolled, POA: Continue with current management with close monitoring of blood glucose  levels Continue metformin  and SSI. Added long acting insulin  12 units at bedtime and premeal scheduled aspart 3 units. Will adjust accordingly. F/u HbA1c-11.5% Discussed with diabetes care coordinator.  Longstanding persistent atrial fibrillation, POA: Rate controlled.  Holding Eliquis  in the setting of hematuria.  May be resumed once hematuria completely resolves  Alzheimer's dementia, POA: No acute issues.  Continue with Aricept  and Namenda   Chronic neuropathic pain, POA: Continue with Lyrica   Disposition: Home with family. May need HHS on discharge.  DVT prophylaxis: SCDs Start: 07/03/24 2216     Code Status: Full Code Family Communication:   Status is: Inpatient Remains inpatient appropriate because: Emphysematous cystitis    Subjective:  Patient is resting comfortably in the bed.  Denies any active complaints. Foley's catheter in place containing clear urine.  Urology is on board.  Examination:  General exam: Appears calm and comfortable  Respiratory system: Clear to auscultation. Respiratory effort normal. Cardiovascular system: S1 & S2 heard, RRR. No JVD, murmurs, rubs, gallops or clicks. No pedal edema. Gastrointestinal system: Abdomen is nondistended, soft and nontender. No organomegaly or masses felt. Normal bowel sounds heard. Central nervous system: Alert and oriented. No focal neurological deficits. Extremities: Symmetric 5 x 5 power. Skin: No rashes, lesions or ulcers Psychiatry: Judgement and insight appear normal. Mood & affect appropriate. Genitourinary: Foley's catheter in place, clear urine in the urine bag      Diet Orders (From admission, onward)     Start     Ordered   07/04/24 1012  Diet Carb Modified Fluid consistency: Thin; Room service appropriate? Yes  Diet effective now       Question Answer Comment  Diet-HS Snack? Nothing   Calorie Level Medium 1600-2000   Fluid consistency: Thin   Room service appropriate? Yes  07/04/24 1011             Objective: Vitals:   07/05/24 2327 07/06/24 0352 07/06/24 0359 07/06/24 0714  BP: 113/74 (!) 172/80 (!) 154/87 (!) 147/80  Pulse: 82 62 60 61  Resp: 20 17 14 19   Temp: 97.8 F (36.6 C)  97.6 F (36.4 C) 97.8 F (36.6 C)  TempSrc: Oral Oral Oral Oral  SpO2: 98% 99% 100% 97%  Weight:      Height:        Intake/Output Summary (Last 24 hours) at 07/06/2024 1057 Last data filed at 07/06/2024 0425 Gross per 24 hour  Intake 2390.94 ml  Output 4375 ml  Net -1984.06 ml   Filed Weights   07/03/24 1614 07/03/24 2227  Weight: 54.4 kg 54.4 kg    Scheduled Meds:  atorvastatin   20 mg Oral Daily   Chlorhexidine  Gluconate Cloth  6 each Topical Daily   donepezil   10 mg Oral QHS   insulin  aspart  0-20 Units Subcutaneous TID WC   insulin  aspart  0-5 Units Subcutaneous QHS   memantine   10 mg Oral BID   metFORMIN   750 mg Oral Daily   pregabalin   100 mg Oral QPM   Continuous Infusions:  cefTRIAXone  (ROCEPHIN )  IV 1 g (07/05/24 2004)   metronidazole  500 mg (07/06/24 0541)    Nutritional status     Body mass index is 19.97 kg/m.  Data Reviewed:   CBC: Recent Labs  Lab 07/03/24 1619 07/04/24 0209 07/05/24 0256 07/06/24 0246  WBC 11.7* 10.1 8.1 7.6  NEUTROABS 9.8*  --   --  4.8  HGB 12.5 11.9* 10.1* 10.2*  HCT 37.8 34.4* 30.2* 30.3*  MCV 96.2 91.2 93.2 93.5  PLT 273 242 213 205   Basic Metabolic Panel: Recent Labs  Lab 07/03/24 1619 07/04/24 0209 07/05/24 0256 07/06/24 0246  NA 134* 138 135 137  K 4.0 3.0* 3.5 4.0  CL 101 107 105 105  CO2 19* 23 22 23   GLUCOSE 451* 167* 260* 368*  BUN 19 15 19 12   CREATININE 1.28* 0.74 0.90 0.84  CALCIUM  9.5 9.2 8.6* 8.6*   GFR: Estimated Creatinine Clearance: 44.3 mL/min (by C-G formula based on SCr of 0.84 mg/dL). Liver Function Tests: Recent Labs  Lab 07/03/24 1619  AST 29  ALT 32  ALKPHOS 129*  BILITOT 0.8  PROT 7.0  ALBUMIN 3.8   Recent Labs  Lab 07/03/24 1619  LIPASE 60*   No results for  input(s): AMMONIA in the last 168 hours. Coagulation Profile: No results for input(s): INR, PROTIME in the last 168 hours. Cardiac Enzymes: No results for input(s): CKTOTAL, CKMB, CKMBINDEX, TROPONINI in the last 168 hours. BNP (last 3 results) No results for input(s): PROBNP in the last 8760 hours. HbA1C: Recent Labs    07/06/24 0246  HGBA1C 11.5*   CBG: Recent Labs  Lab 07/05/24 1623 07/05/24 2015 07/05/24 2346 07/06/24 0404 07/06/24 0709  GLUCAP 258* 298* 324* 330* 290*   Lipid Profile: No results for input(s): CHOL, HDL, LDLCALC, TRIG, CHOLHDL, LDLDIRECT in the last 72 hours. Thyroid  Function Tests: No results for input(s): TSH, T4TOTAL, FREET4, T3FREE, THYROIDAB in the last 72 hours. Anemia Panel: No results for input(s): VITAMINB12, FOLATE, FERRITIN, TIBC, IRON, RETICCTPCT in the last 72 hours. Sepsis Labs: Recent Labs  Lab 07/03/24 1951 07/03/24 2349  LATICACIDVEN 4.0* 1.3    Recent Results (from the past 240 hours)  Blood culture (routine x 2)     Status: None (  Preliminary result)   Collection Time: 07/03/24  5:50 PM   Specimen: BLOOD  Result Value Ref Range Status   Specimen Description BLOOD SITE NOT SPECIFIED  Final   Special Requests   Final    BOTTLES DRAWN AEROBIC AND ANAEROBIC Blood Culture results may not be optimal due to an inadequate volume of blood received in culture bottles   Culture   Final    NO GROWTH 3 DAYS Performed at [facility], [address] [place], [place] [address]    Report Status PENDING  Incomplete  MRSA Next Gen by PCR, Nasal     Status: None   Collection Time: 07/03/24 11:31 PM   Specimen: Nasal Mucosa; Nasal Swab  Result Value Ref Range Status   MRSA by PCR Next Gen NOT DETECTED NOT DETECTED Final    Comment: (NOTE) The GeneXpert MRSA Assay (FDA approved for NASAL specimens only), is one component of a comprehensive MRSA colonization surveillance program. It is  not intended to diagnose MRSA infection nor to guide or monitor treatment for MRSA infections. Test performance is not FDA approved in patients less than 64 years old. Performed at [facility], [address] [place], [place] [address]   Blood culture (routine x 2)     Status: None (Preliminary result)   Collection Time: 07/03/24 11:49 PM   Specimen: BLOOD LEFT ARM  Result Value Ref Range Status   Specimen Description BLOOD LEFT ARM  Final   Special Requests   Final    BOTTLES DRAWN AEROBIC AND ANAEROBIC Blood Culture adequate volume   Culture   Final    NO GROWTH 2 DAYS Performed at [facility], [address] [place], [place] [address]    Report Status PENDING  Incomplete         Radiology Studies: No results found.     LOS: 3 days   Time spent= 42 mins  Deliliah Room, MD [facility]  If 7PM-7AM, please contact night-coverage  07/06/2024, 10:57 AM

## 2024-07-06 NOTE — Progress Notes (Signed)
 Mobility Specialist Progress Note;   07/06/24 0852  Mobility  Activity Stood at bedside  Level of Assistance Minimal assist, patient does 75% or more  Assistive Device None  Activity Response Tolerated fair  Mobility Referral Yes  Mobility visit 1 Mobility  Mobility Specialist Start Time (ACUTE ONLY) [id number]  Mobility Specialist Stop Time (ACUTE ONLY) 0908  Mobility Specialist Time Calculation (min) (ACUTE ONLY) 16 min   Pt agreeable to mobility. Required MinA for bed mobility and to stand from bedside. Upon standing, pt c/o feeling dizzy BP taken (161/79 (100)). Dizziness never recovered so further mobility was deferred at this time. Pt returned back to supine and left with all needs met, alarm on. RN notified.   Lauraine Erm Mobility Specialist Please contact via SecureChat or [employer] [phone]

## 2024-07-06 NOTE — Inpatient Diabetes Management (Addendum)
 Inpatient Diabetes Program Recommendations  AACE/ADA: New Consensus Statement on Inpatient Glycemic Control (2015)  Target Ranges:  Prepandial:   less than 140 mg/dL      Peak postprandial:   less than 180 mg/dL (1-2 hours)      Critically ill patients:  140 - 180 mg/dL    Latest Reference Range & Units 07/03/24 16:19  Glucose 70 - 99 mg/dL 548 (H)  (H): Data is abnormally high  Latest Reference Range & Units 01/14/24 06:22 07/06/24 02:46  Hemoglobin A1C 4.8 - 5.6 % 6.0 (H) 11.5 (H)  283 mg/dl  (H): Data is abnormally high  Latest Reference Range & Units 07/04/24 23:25 07/05/24 03:41 07/05/24 07:58 07/05/24 12:06 07/05/24 16:23 07/05/24 20:15  Glucose-Capillary 70 - 99 mg/dL 716 (H)  5 units Novolog   250 (H)  3 units Novolog   272 (H)  5 units Novolog   314 (H)  7 units Novolog   258 (H)  7 units Novolog   298 (H)  3 units Novolog    (H): Data is abnormally high  Latest Reference Range & Units 07/05/24 23:46 07/06/24 04:04 07/06/24 07:09  Glucose-Capillary 70 - 99 mg/dL 675 (H) 669 (H) 709 (H)  11 units Novolog    (H): Data is abnormally high   Admit with: Acute emphysematous cystitis likely in the setting of recent Klebsiella UTI, POA   History: DM, Dementia  Home DM Meds: Metformin  750 mg daily  Current Orders: Novolog  Resistant Correction Scale/ SSI (0-20 units) TID AC + HS     Metformin  750 mg daily     MD- Please consider:  1. Start Semglee  5 units daily (0.1 units/kg)  2. Start Novolog  Meal Coverage: Novolog  3 units TID with meals HOLD if pt NPO HOLD if pt eats <50% meals  Will pt need Insulin  for home?  Her Current A1c is now 11.5%    Addendum 1pm--Met w/ pt and son.  Pt lives with son and son provides all care as pt has dementia.  Son told me they stopped insulin  in January since pt's A1c was down to 6% and pt was having issues with HYPO at home (per PCP instructions).  Son continued to give Metformin  750 mg daily as ordered.  He told me he threw  pt's CBG meter away as he didn't realize he needed to keep checking her CBGs.  Pt currently sees MD at [facility].  Discussed with son that since pt's A1c has risen dramatically to 11.5% since Jan, that the MD would like for pt to restart insulin  at home.  Son stated he was OK with this and knows how to administer insulin  pen injections.  I verbally reviewed with son insulin  pen use, storage of insulin , importance of rotation of injection sites, etc.  Told son I will contact OP Pharmacy to check best covered insulin  based on pt's insurance.  Will request CBG meter Rx at time of d/c as well.    --Will follow patient during hospitalization--  Adina Rudolpho Arrow RN, MSN, CDCES Diabetes Coordinator Inpatient Glycemic Control Team Team Pager: [phone] (8a-5p)

## 2024-07-07 LAB — URINE CULTURE: Culture: >=100000 — AB

## 2024-07-07 LAB — GLUCOSE, CAPILLARY
Glucose-Capillary: 295 mg/dL — ABNORMAL HIGH (ref 70–99)
Glucose-Capillary: 348 mg/dL — ABNORMAL HIGH (ref 70–99)
Glucose-Capillary: 162 mg/dL — ABNORMAL HIGH (ref 70–99)

## 2024-07-07 MED ORDER — CEFDINIR 300 MG PO CAPS: 300.0000 mg | ORAL_CAPSULE | Freq: Two times a day (BID) | ORAL | 0 refills | Status: DC

## 2024-07-07 MED ORDER — INSULIN ASPART 100 UNIT/ML IJ SOLN
5.0000 [IU] | Freq: Three times a day (TID) | INTRAMUSCULAR | 0 refills | Status: DC
Start: 2024-07-07 — End: 2024-07-07

## 2024-07-07 MED ORDER — LANCET DEVICE MISC: 1.0000 | Freq: Three times a day (TID) | 0 refills | Status: AC

## 2024-07-07 MED ORDER — BLOOD GLUCOSE TEST VI STRP: 1.0000 | ORAL_STRIP | Freq: Three times a day (TID) | 0 refills | Status: AC

## 2024-07-07 MED ORDER — INSULIN GLARGINE-YFGN 100 UNIT/ML ~~LOC~~ SOLN: 14.0000 [IU] | Freq: Every day | SUBCUTANEOUS | 0 refills | Status: DC

## 2024-07-07 MED ORDER — INSULIN GLARGINE-YFGN 100 UNIT/ML ~~LOC~~ SOLN: 14.0000 [IU] | Freq: Every day | SUBCUTANEOUS | Status: DC

## 2024-07-07 MED ORDER — BLOOD GLUCOSE MONITORING SUPPL DEVI
1.0000 | Freq: Three times a day (TID) | 0 refills | Status: AC
Start: 2024-07-07 — End: ?

## 2024-07-07 MED ORDER — CEFUROXIME AXETIL 500 MG PO TABS: 500.0000 mg | ORAL_TABLET | Freq: Two times a day (BID) | ORAL | 0 refills | Status: DC

## 2024-07-07 MED ORDER — PEN NEEDLES 31G X 5 MM MISC: 1.0000 | Freq: Three times a day (TID) | 0 refills | Status: AC

## 2024-07-07 MED ORDER — INSULIN ASPART 100 UNIT/ML FLEXPEN: 5.0000 [IU] | PEN_INJECTOR | Freq: Three times a day (TID) | SUBCUTANEOUS | 0 refills | Status: AC

## 2024-07-07 MED ORDER — INSULIN ASPART 100 UNIT/ML IJ SOLN
5.0000 [IU] | Freq: Three times a day (TID) | INTRAMUSCULAR | Status: DC
  Administered 2024-07-07: 5 [IU] via SUBCUTANEOUS

## 2024-07-07 MED ORDER — INSULIN ASPART 100 UNIT/ML IJ SOLN: 0.0000 [IU] | Freq: Three times a day (TID) | INTRAMUSCULAR | 0 refills | Status: DC

## 2024-07-07 MED ORDER — INSULIN GLARGINE 100 UNIT/ML SOLOSTAR PEN: 16.0000 [IU] | PEN_INJECTOR | Freq: Every day | SUBCUTANEOUS | 0 refills | Status: AC

## 2024-07-07 MED ORDER — INSULIN STARTER KIT- PEN NEEDLES (ENGLISH): 1.0000 | Freq: Once | 0 refills | Status: DC

## 2024-07-07 MED ORDER — LANCETS MISC. MISC: 1.0000 | Freq: Three times a day (TID) | 0 refills | Status: AC

## 2024-07-07 NOTE — Plan of Care (Signed)

## 2024-07-07 NOTE — Discharge Summary (Addendum)
 Physician Discharge Summary   Patient: Jacqueline Ball MRN: [id number] DOB: 1941/05/22  Admit date:     07/03/2024  Discharge date: 07/07/24  Discharge Physician: Deliliah Room   PCP: Haze Kingfisher, MD   Recommendations at discharge:    Follow up with your PCP in one week Continue taking meds as prescribed Keep Foley catheter in place Out patient follow up with urology in a week. Check blood glucose levels at least three times a day, once before breakfast, once at lunchtime and once at dinner time  Discharge Diagnoses: Principal Problem:   Emphysematous cystitis Active Problems:   Essential hypertension   Late onset Alzheimer's disease without behavioral disturbance (HCC)   AKI (acute kidney injury) (HCC)   Hyponatremia    Hospital Course:  Acute emphysematous cystitis likely in the setting of recent Klebsiella UTI, POA: Patient is afebrile and there is no evidence of leukocytosis now.  Received intravenous ceftriaxone  in the hospital along with flagyl .  Urology on board.  No urological intervention needs to be done in the hospital.  Foley's catheter in place.  Patient will need outpatient CT cystogram to rule out a fistula before removal of foley catheter. Follow-up blood cultures-NGTD. Discussed case with Ole NP Urology and plan is to keep foley in place on discharge to help with the healing process of cystitis. Dced on 7 days of Oral cefuroxime .   Pneumoperitoneum: Likely related to cystitis. Surgical team on board. No surgical intervention needed.   Acute kidney injury, POA: Resolved with intravenous fluids.   Hypokalemia: repleted and resolved   Essential hypertension: Daily BP check at home   Type 2 diabetes mellitus with hyperglycemia,uncontrolled, POA:  Discharged on metformin  ,SSI, long acting insulin  16 units at bedtime and premeal scheduled aspart 5 units.  Advised to check glucose levels at least thrice daily Glucometer and other supplies  ordered F/u HbA1c-11.5% Discussed with diabetes care coordinator. PCP to adjust insulin  regimen based on blood glucose levels   Longstanding persistent atrial fibrillation, POA: Rate controlled.  Holding Eliquis  in the setting of hematuria.  May be resumed once hematuria completely resolves   Alzheimer's dementia, POA: No acute issues.  Continue with Aricept  and Namenda    Chronic neuropathic pain, POA: Continue with Lyrica    Disposition: Home with family      Consultants: Urology, Surgery Procedures performed: None  Disposition: Home Diet recommendation:  Cardiac and Carb modified diet DISCHARGE MEDICATION: Allergies as of 07/07/2024       Reactions   Metoprolol  Nausea And Vomiting        Medication List     STOP taking these medications    diphenhydramine-acetaminophen  25-500 MG Tabs tablet Commonly known as: TYLENOL  PM       TAKE these medications    acetaminophen  500 MG tablet Commonly known as: TYLENOL  Take 1,000 mg by mouth every 6 (six) hours as needed for pain.   atorvastatin  20 MG tablet Commonly known as: LIPITOR Take 20 mg by mouth daily.   Blood Glucose Monitoring Suppl Devi 1 each by Does not apply route in the morning, at noon, and at bedtime. May substitute to any manufacturer covered by patient's insurance.   BLOOD GLUCOSE TEST STRIPS Strp 1 each by In Vitro route in the morning, at noon, and at bedtime. May substitute to any manufacturer covered by patient's insurance.   cefUROXime  500 MG tablet Commonly known as: CEFTIN  Take 1 tablet (500 mg total) by mouth 2 (two) times daily with a meal for 11 days.  donepezil  5 MG tablet Commonly known as: ARICEPT  Take 10 mg by mouth at bedtime.   Eliquis  2.5 MG Tabs tablet Generic drug: apixaban  Take 2.5 mg by mouth 2 (two) times daily.   insulin  aspart 100 UNIT/ML FlexPen Commonly known as: NOVOLOG  Inject 5-16 Units into the skin 3 (three) times daily with meals. If eating and Blood Glucose  (BG) 80 or higher inject 5 units. If not eating, correction dose only. BG <150= 5 unit; BG 150-200= 6 unit; BG 201-250= 8 unit; BG 251-300= 10 unit; BG 301-350= 12 unit; BG 351-400= 14 unit; BG >400= 16 unit and Call Primary Care.   insulin  glargine 100 UNIT/ML Solostar Pen Commonly known as: LANTUS  Inject 16 Units into the skin daily. May substitute as needed per insurance.   Lancet Device Misc 1 each by Does not apply route in the morning, at noon, and at bedtime. May substitute to any manufacturer covered by patient's insurance.   Lancets Misc. Misc 1 each by Does not apply route in the morning, at noon, and at bedtime. May substitute to any manufacturer covered by patient's insurance.   memantine  10 MG tablet Commonly known as: NAMENDA  Take 1 tablet (10 mg at night) for 2 weeks, then increase to 1 tablet (10 mg) twice a day What changed:  how much to take how to take this when to take this additional instructions   METFORMIN  HCL PO Take 750 mg by mouth daily.   Pen Needles 31G X 5 MM Misc 1 each by Does not apply route 3 (three) times daily. May dispense any manufacturer covered by patient's insurance.   pregabalin  100 MG capsule Commonly known as: LYRICA  Take 100 mg by mouth every evening.        Follow-up Information     Paliwal, Himanshu, MD. Call in 1 week(s).   Specialty: Family Medicine Contact information: [address] [place] [address] [phone]         Delia Ole ORN, NP. Call in 1 week(s).   Specialty: Urology Contact information: [address] [place] [address] [place] [address] [phone]                Discharge Exam: Fredricka Weights   07/03/24 1614 07/03/24 2227  Weight: 54.4 kg 54.4 kg   Constitutional: NAD, calm, comfortable Eyes: PERRL, lids and conjunctivae normal ENMT: Mucous membranes are moist. Posterior pharynx clear of any exudate or lesions.Normal dentition.  Neck: normal, supple, no masses, no  thyromegaly Respiratory: clear to auscultation bilaterally, no wheezing, no crackles. Normal respiratory effort. No accessory muscle use.  Cardiovascular: Regular rate and rhythm, no murmurs / rubs / gallops. No extremity edema. 2+ pedal pulses. No carotid bruits.  Abdomen: no tenderness, no masses palpated. No hepatosplenomegaly. Bowel sounds positive.  Musculoskeletal: no clubbing / cyanosis. No joint deformity upper and lower extremities. Good ROM, no contractures. Normal muscle tone.  Skin: no rashes, lesions, ulcers. No induration Neurologic: CN 2-12 grossly intact. Sensation intact, DTR normal. Strength 5/5 x all 4 extremities.  Psychiatric: Normal judgment and insight. Alert and oriented x 3. Normal mood.    Condition at discharge: good  The results of significant diagnostics from this hospitalization (including imaging, microbiology, ancillary and laboratory) are listed below for reference.   Imaging Studies: CT ABDOMEN PELVIS W CONTRAST Result Date: 07/03/2024 CLINICAL DATA:  Two day history of dysuria, urinary frequency, and hematuria. Urinary tract infection EXAM: CT ABDOMEN AND PELVIS WITH CONTRAST TECHNIQUE: Multidetector CT imaging of the abdomen and  pelvis was performed using the standard protocol following bolus administration of intravenous contrast. RADIATION DOSE REDUCTION: This exam was performed according to the departmental dose-optimization program which includes automated exposure control, adjustment of the mA and/or kV according to patient size and/or use of iterative reconstruction technique. CONTRAST:  75mL OMNIPAQUE  IOHEXOL  350 MG/ML SOLN COMPARISON:  CT abdomen and pelvis dated 06/02/2024 FINDINGS: Lower chest: No focal consolidation or pulmonary nodule in the lung bases. No pleural effusion or pneumothorax demonstrated. Partially imaged heart size is normal. Hepatobiliary: Scattered subcentimeter hypodensities, too small to characterize but likely cysts. No intra or  extrahepatic biliary ductal dilation. Normal gallbladder. Pancreas: No focal lesions or main ductal dilation. Spleen: Normal in size without focal abnormality. Adrenals/Urinary Tract: No adrenal nodules. No suspicious renal mass, calculi or hydronephrosis. Unchanged multifocal left renal cysts, many of which are mildly hyperattenuating and likely hemorrhagic/proteinaceous. Multiple additional subcentimeter hypodensities, too small to characterize but also likely cysts. Extensive, circumferential submucosal emphysema. Circumferential mural thickening. Stomach/Bowel: Normal appearance of the stomach. No evidence of bowel wall thickening, distention, or inflammatory changes. Colonic diverticulosis without acute diverticulitis. Normal appendix. Vascular/Lymphatic: Aortic atherosclerosis. No enlarged abdominal or pelvic lymph nodes. Reproductive: No adnexal masses. Other: Small volume free air dissecting through the peritoneal fat surrounding the bladder. A few of the gaseous foci are closely associated with loops of small bowel in the lower abdomen. Musculoskeletal: No acute or abnormal lytic or blastic osseous lesions. Multilevel degenerative changes of the partially imaged thoracic and lumbar spine. IMPRESSION: 1. 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 A few of the gaseous foci are closely associated with loops of small bowel in the lower abdomen and may be intraperitoneal. 2. Colonic diverticulosis without acute diverticulitis. 3.  Aortic Atherosclerosis (ICD10-I70.0). Critical Value/emergent results were called by telephone at the time of interpretation on 07/03/2024 at 7:24 pm to provider Carlo cornish, who verbally acknowledged these results. Electronically Signed   By: Limin  Xu M.D.   On: 07/03/2024 19:32    Microbiology: Results for orders placed or performed during the hospital encounter of 07/03/24  Urine Culture     Status:  Abnormal   Collection Time: 07/03/24  4:05 PM   Specimen: Urine, Random  Result Value Ref Range Status   Specimen Description URINE, RANDOM  Final   Special Requests   Final    NONE Reflexed from [id number] Performed at [facility], [address], [place], [place] [address]    Culture >=100,000 COLONIES/mL KLEBSIELLA PNEUMONIAE (A)  Final   Report Status 07/07/2024 FINAL  Final   Organism ID, Bacteria KLEBSIELLA PNEUMONIAE (A)  Final      Susceptibility   Klebsiella pneumoniae - MIC*    AMPICILLIN  RESISTANT Resistant     CEFAZOLIN <=4 SENSITIVE Sensitive     CEFEPIME  <=0.12 SENSITIVE Sensitive     CEFTRIAXONE  <=0.25 SENSITIVE Sensitive     CIPROFLOXACIN <=0.25 SENSITIVE Sensitive     GENTAMICIN <=1 SENSITIVE Sensitive     IMIPENEM <=0.25 SENSITIVE Sensitive     NITROFURANTOIN <=16 SENSITIVE Sensitive     TRIMETH /SULFA  <=20 SENSITIVE Sensitive     AMPICILLIN /SULBACTAM <=2 SENSITIVE Sensitive     PIP/TAZO <=4 SENSITIVE Sensitive ug/mL    * >=100,000 COLONIES/mL KLEBSIELLA PNEUMONIAE  Blood culture (routine x 2)     Status: None (Preliminary result)   Collection Time: 07/03/24  5:50 PM   Specimen: BLOOD  Result Value Ref Range Status   Specimen Description  BLOOD SITE NOT SPECIFIED  Final   Special Requests   Final    BOTTLES DRAWN AEROBIC AND ANAEROBIC Blood Culture results may not be optimal due to an inadequate volume of blood received in culture bottles   Culture   Final    NO GROWTH 4 DAYS Performed at [facility], [address] [place], [place] [address]    Report Status PENDING  Incomplete  MRSA Next Gen by PCR, Nasal     Status: None   Collection Time: 07/03/24 11:31 PM   Specimen: Nasal Mucosa; Nasal Swab  Result Value Ref Range Status   MRSA by PCR Next Gen NOT DETECTED NOT DETECTED Final    Comment: (NOTE) The GeneXpert MRSA Assay (FDA approved for NASAL specimens only), is one component of a comprehensive MRSA colonization surveillance program. It is  not intended to diagnose MRSA infection nor to guide or monitor treatment for MRSA infections. Test performance is not FDA approved in patients less than 25 years old. Performed at [facility], [address] [place], [place] [address]   Blood culture (routine x 2)     Status: None (Preliminary result)   Collection Time: 07/03/24 11:49 PM   Specimen: BLOOD LEFT ARM  Result Value Ref Range Status   Specimen Description BLOOD LEFT ARM  Final   Special Requests   Final    BOTTLES DRAWN AEROBIC AND ANAEROBIC Blood Culture adequate volume   Culture   Final    NO GROWTH 3 DAYS Performed at [facility], [address], [place], [place] [address]    Report Status PENDING  Incomplete    Labs: CBC: Recent Labs  Lab 07/03/24 1619 07/04/24 0209 07/05/24 0256 07/06/24 0246  WBC 11.7* 10.1 8.1 7.6  NEUTROABS 9.8*  --   --  4.8  HGB 12.5 11.9* 10.1* 10.2*  HCT 37.8 34.4* 30.2* 30.3*  MCV 96.2 91.2 93.2 93.5  PLT 273 242 213 205   Basic Metabolic Panel: Recent Labs  Lab 07/03/24 1619 07/04/24 0209 07/05/24 0256 07/06/24 0246  NA 134* 138 135 137  K 4.0 3.0* 3.5 4.0  CL 101 107 105 105  CO2 19* 23 22 23   GLUCOSE 451* 167* 260* 368*  BUN 19 15 19 12   CREATININE 1.28* 0.74 0.90 0.84  CALCIUM  9.5 9.2 8.6* 8.6*   Liver Function Tests: Recent Labs  Lab 07/03/24 1619  AST 29  ALT 32  ALKPHOS 129*  BILITOT 0.8  PROT 7.0  ALBUMIN 3.8   CBG: Recent Labs  Lab 07/06/24 1624 07/06/24 1650 07/06/24 2120 07/07/24 0613 07/07/24 0806  GLUCAP 64* 100* 285* 295* 348*    Discharge time spent: 43 minutes.  Signed: Deliliah Room, MD [facility] 07/07/2024

## 2024-07-07 NOTE — TOC Transition Note (Signed)
 Transition of Care [facility]) - Discharge Note   Patient Details  Name: Jacqueline Ball MRN: [id number] Date of Birth: 12/08/1941  Transition of Care [facility]) CM/SW Contact:  Roxie KANDICE Stain, RN Phone Number: 07/07/2024, 10:33 AM   Clinical Narrative:    [name] is stable to discharge home. Follow up apt on [facility]. No TOC needs at this time.   Final next level of care: Home/Self Care Barriers to Discharge: Barriers Resolved   Patient Goals and CMS Choice Patient states their goals for this hospitalization and ongoing recovery are:: return home          Discharge Placement                 home      Discharge Plan and Services Additional resources added to the After Visit Summary for                                       Social Drivers of Health (SDOH) Interventions SDOH Screenings   Food Insecurity: No Food Insecurity (07/04/2024)  Housing: Unknown (07/04/2024)  Transportation Needs: No Transportation Needs (07/04/2024)  Utilities: Not At Risk (07/04/2024)  Social Connections: Unknown (07/04/2024)  Tobacco Use: Low Risk  (07/04/2024)     Readmission Risk Interventions    07/07/2024   10:33 AM  Readmission Risk Prevention Plan  Transportation Screening Complete  PCP or Specialist Appt within 5-7 Days Complete  Home Care Screening Complete  Medication Review (RN CM) Complete

## 2024-07-07 NOTE — Inpatient Diabetes Management (Signed)
 Inpatient Diabetes Program Recommendations  AACE/ADA: New Consensus Statement on Inpatient Glycemic Control   Target Ranges:  Prepandial:   less than 140 mg/dL      Peak postprandial:   less than 180 mg/dL (1-2 hours)      Critically ill patients:  140 - 180 mg/dL    Latest Reference Range & Units 07/06/24 07:09 07/06/24 11:03 07/06/24 16:24 07/06/24 16:50 07/06/24 21:20 07/07/24 06:13 07/07/24 08:06  Glucose-Capillary 70 - 99 mg/dL 709 (H)  Novolog  11 units 304 (H)  Novolog  18 units  Semglee  12 units 64 (L) 100 (H) 285 (H)  Novolog  3 units 295 (H) 348 (H)  Novolog  18 units  Semglee  12 units   Review of Glycemic Control  Diabetes history: DM2 Outpatient Diabetes medications: Metformin  750 mg daily Current orders for Inpatient glycemic control: Semglee  12 units daily, Novolog  3 units TID with meals, Novolog  0-20 units TID with meals, Novolog  0-5 units at bedtime, Metformin  XR 750 mg daily  Inpatient Diabetes Program Recommendations:    Insulin : Please consider increasing Semglee  to 14 units daily, decrease Novolog  correction to 0-15 units TID with meals, and increase meal coverage to Novolog  5 units TID with meals.  Discharge Recommendations: Long acting recommendations: Insulin  Glargine (LANTUS ) Solostar Pen Dose TBD by MD at time of discharge  Short acting recommendations:  Meal coverage ONLY Insulin  aspart (NOVOLOG ) FlexPen  Dose TBD by MD at time of discharge   Supply/Referral recommendations: Glucometer Test strips Lancet device Lancets Pen needles - standard   Use Adult Diabetes Insulin  Treatment Post Discharge order set.   Thanks, Earnie Gainer, RN, MSN, CDCES Diabetes Coordinator Inpatient Diabetes Program [phone] (Team Pager from 8am to 5pm)

## 2024-07-08 LAB — CULTURE, BLOOD (ROUTINE X 2): Culture: NO GROWTH

## 2024-07-09 ENCOUNTER — Other Ambulatory Visit: Payer: Self-pay

## 2024-07-09 ENCOUNTER — Emergency Department (HOSPITAL_COMMUNITY)

## 2024-07-09 ENCOUNTER — Inpatient Hospital Stay (HOSPITAL_COMMUNITY)
Admission: EM | Admit: 2024-07-09 | Discharge: 2024-07-15 | DRG: 315 | Disposition: A | Discharge status: Skilled Nursing Facility | Attending: Internal Medicine | Admitting: Internal Medicine

## 2024-07-09 ENCOUNTER — Inpatient Hospital Stay (HOSPITAL_COMMUNITY)

## 2024-07-09 ENCOUNTER — Encounter (HOSPITAL_COMMUNITY): Payer: Self-pay

## 2024-07-09 DIAGNOSIS — F419 Anxiety disorder, unspecified: Secondary | ICD-10-CM | POA: Diagnosis not present

## 2024-07-09 DIAGNOSIS — Z1152 Encounter for screening for COVID-19: Secondary | ICD-10-CM

## 2024-07-09 DIAGNOSIS — N179 Acute kidney failure, unspecified: Secondary | ICD-10-CM | POA: Diagnosis not present

## 2024-07-09 DIAGNOSIS — E785 Hyperlipidemia, unspecified: Secondary | ICD-10-CM | POA: Diagnosis not present

## 2024-07-09 DIAGNOSIS — M25562 Pain in left knee: Secondary | ICD-10-CM | POA: Diagnosis present

## 2024-07-09 DIAGNOSIS — K810 Acute cholecystitis: Secondary | ICD-10-CM | POA: Diagnosis present

## 2024-07-09 DIAGNOSIS — M62838 Other muscle spasm: Secondary | ICD-10-CM

## 2024-07-09 DIAGNOSIS — R4182 Altered mental status, unspecified: Secondary | ICD-10-CM | POA: Diagnosis not present

## 2024-07-09 DIAGNOSIS — Z7901 Long term (current) use of anticoagulants: Secondary | ICD-10-CM

## 2024-07-09 DIAGNOSIS — Z833 Family history of diabetes mellitus: Secondary | ICD-10-CM | POA: Diagnosis not present

## 2024-07-09 DIAGNOSIS — N281 Cyst of kidney, acquired: Secondary | ICD-10-CM | POA: Diagnosis not present

## 2024-07-09 DIAGNOSIS — Z794 Long term (current) use of insulin: Secondary | ICD-10-CM | POA: Diagnosis not present

## 2024-07-09 DIAGNOSIS — E119 Type 2 diabetes mellitus without complications: Secondary | ICD-10-CM | POA: Diagnosis present

## 2024-07-09 DIAGNOSIS — I959 Hypotension, unspecified: Principal | ICD-10-CM | POA: Diagnosis present

## 2024-07-09 DIAGNOSIS — N3281 Overactive bladder: Secondary | ICD-10-CM | POA: Diagnosis not present

## 2024-07-09 DIAGNOSIS — E871 Hypo-osmolality and hyponatremia: Secondary | ICD-10-CM | POA: Diagnosis not present

## 2024-07-09 DIAGNOSIS — I6529 Occlusion and stenosis of unspecified carotid artery: Secondary | ICD-10-CM | POA: Diagnosis not present

## 2024-07-09 DIAGNOSIS — E876 Hypokalemia: Secondary | ICD-10-CM | POA: Diagnosis not present

## 2024-07-09 DIAGNOSIS — G309 Alzheimer's disease, unspecified: Secondary | ICD-10-CM | POA: Diagnosis not present

## 2024-07-09 DIAGNOSIS — N138 Other obstructive and reflux uropathy: Secondary | ICD-10-CM | POA: Diagnosis not present

## 2024-07-09 DIAGNOSIS — A419 Sepsis, unspecified organism: Secondary | ICD-10-CM | POA: Diagnosis not present

## 2024-07-09 DIAGNOSIS — I10 Essential (primary) hypertension: Secondary | ICD-10-CM | POA: Diagnosis not present

## 2024-07-09 DIAGNOSIS — R231 Pallor: Secondary | ICD-10-CM | POA: Diagnosis not present

## 2024-07-09 DIAGNOSIS — I482 Chronic atrial fibrillation, unspecified: Secondary | ICD-10-CM | POA: Diagnosis not present

## 2024-07-09 DIAGNOSIS — M542 Cervicalgia: Secondary | ICD-10-CM | POA: Diagnosis present

## 2024-07-09 DIAGNOSIS — K219 Gastro-esophageal reflux disease without esophagitis: Secondary | ICD-10-CM | POA: Diagnosis not present

## 2024-07-09 DIAGNOSIS — D72829 Elevated white blood cell count, unspecified: Secondary | ICD-10-CM | POA: Diagnosis not present

## 2024-07-09 DIAGNOSIS — Z7401 Bed confinement status: Secondary | ICD-10-CM | POA: Diagnosis not present

## 2024-07-09 DIAGNOSIS — R55 Syncope and collapse: Secondary | ICD-10-CM | POA: Diagnosis not present

## 2024-07-09 DIAGNOSIS — E1165 Type 2 diabetes mellitus with hyperglycemia: Secondary | ICD-10-CM | POA: Diagnosis not present

## 2024-07-09 DIAGNOSIS — R932 Abnormal findings on diagnostic imaging of liver and biliary tract: Secondary | ICD-10-CM | POA: Diagnosis not present

## 2024-07-09 DIAGNOSIS — M545 Low back pain, unspecified: Secondary | ICD-10-CM | POA: Diagnosis not present

## 2024-07-09 DIAGNOSIS — R278 Other lack of coordination: Secondary | ICD-10-CM | POA: Diagnosis not present

## 2024-07-09 DIAGNOSIS — G8929 Other chronic pain: Secondary | ICD-10-CM | POA: Diagnosis present

## 2024-07-09 DIAGNOSIS — N3080 Other cystitis without hematuria: Secondary | ICD-10-CM | POA: Diagnosis present

## 2024-07-09 DIAGNOSIS — Z79899 Other long term (current) drug therapy: Secondary | ICD-10-CM

## 2024-07-09 DIAGNOSIS — N390 Urinary tract infection, site not specified: Secondary | ICD-10-CM | POA: Diagnosis not present

## 2024-07-09 DIAGNOSIS — Z90710 Acquired absence of both cervix and uterus: Secondary | ICD-10-CM | POA: Diagnosis not present

## 2024-07-09 DIAGNOSIS — R9089 Other abnormal findings on diagnostic imaging of central nervous system: Secondary | ICD-10-CM | POA: Diagnosis not present

## 2024-07-09 DIAGNOSIS — G319 Degenerative disease of nervous system, unspecified: Secondary | ICD-10-CM | POA: Diagnosis not present

## 2024-07-09 DIAGNOSIS — M50222 Other cervical disc displacement at C5-C6 level: Secondary | ICD-10-CM | POA: Diagnosis not present

## 2024-07-09 DIAGNOSIS — R1312 Dysphagia, oropharyngeal phase: Secondary | ICD-10-CM | POA: Diagnosis not present

## 2024-07-09 DIAGNOSIS — R42 Dizziness and giddiness: Secondary | ICD-10-CM | POA: Diagnosis not present

## 2024-07-09 DIAGNOSIS — K5730 Diverticulosis of large intestine without perforation or abscess without bleeding: Secondary | ICD-10-CM | POA: Diagnosis not present

## 2024-07-09 DIAGNOSIS — Z743 Need for continuous supervision: Secondary | ICD-10-CM | POA: Diagnosis not present

## 2024-07-09 DIAGNOSIS — I4819 Other persistent atrial fibrillation: Secondary | ICD-10-CM | POA: Diagnosis present

## 2024-07-09 DIAGNOSIS — B961 Klebsiella pneumoniae [K. pneumoniae] as the cause of diseases classified elsewhere: Secondary | ICD-10-CM | POA: Diagnosis not present

## 2024-07-09 DIAGNOSIS — G4486 Cervicogenic headache: Secondary | ICD-10-CM | POA: Diagnosis present

## 2024-07-09 DIAGNOSIS — E869 Volume depletion, unspecified: Secondary | ICD-10-CM | POA: Diagnosis present

## 2024-07-09 DIAGNOSIS — R10819 Abdominal tenderness, unspecified site: Secondary | ICD-10-CM | POA: Diagnosis not present

## 2024-07-09 DIAGNOSIS — R0902 Hypoxemia: Secondary | ICD-10-CM | POA: Diagnosis not present

## 2024-07-09 DIAGNOSIS — I6622 Occlusion and stenosis of left posterior cerebral artery: Secondary | ICD-10-CM | POA: Diagnosis not present

## 2024-07-09 DIAGNOSIS — D649 Anemia, unspecified: Secondary | ICD-10-CM | POA: Diagnosis present

## 2024-07-09 DIAGNOSIS — R2689 Other abnormalities of gait and mobility: Secondary | ICD-10-CM | POA: Diagnosis not present

## 2024-07-09 DIAGNOSIS — M1990 Unspecified osteoarthritis, unspecified site: Secondary | ICD-10-CM | POA: Diagnosis not present

## 2024-07-09 DIAGNOSIS — I6782 Cerebral ischemia: Secondary | ICD-10-CM | POA: Diagnosis not present

## 2024-07-09 DIAGNOSIS — Z888 Allergy status to other drugs, medicaments and biological substances status: Secondary | ICD-10-CM

## 2024-07-09 DIAGNOSIS — M6281 Muscle weakness (generalized): Secondary | ICD-10-CM | POA: Diagnosis not present

## 2024-07-09 DIAGNOSIS — M47812 Spondylosis without myelopathy or radiculopathy, cervical region: Secondary | ICD-10-CM | POA: Diagnosis not present

## 2024-07-09 DIAGNOSIS — G301 Alzheimer's disease with late onset: Secondary | ICD-10-CM | POA: Diagnosis not present

## 2024-07-09 DIAGNOSIS — E0940 Drug or chemical induced diabetes mellitus with neurological complications with diabetic neuropathy, unspecified: Secondary | ICD-10-CM | POA: Diagnosis not present

## 2024-07-09 DIAGNOSIS — M4312 Spondylolisthesis, cervical region: Secondary | ICD-10-CM | POA: Diagnosis not present

## 2024-07-09 DIAGNOSIS — R9082 White matter disease, unspecified: Secondary | ICD-10-CM | POA: Diagnosis not present

## 2024-07-09 DIAGNOSIS — F0280 Dementia in other diseases classified elsewhere without behavioral disturbance: Secondary | ICD-10-CM | POA: Diagnosis not present

## 2024-07-09 DIAGNOSIS — E162 Hypoglycemia, unspecified: Secondary | ICD-10-CM | POA: Diagnosis not present

## 2024-07-09 DIAGNOSIS — R41841 Cognitive communication deficit: Secondary | ICD-10-CM | POA: Diagnosis not present

## 2024-07-09 LAB — PROTIME-INR
INR: 1.1 (ref 0.8–1.2)
Prothrombin Time: 14.7 s (ref 11.4–15.2)

## 2024-07-09 LAB — RESP PANEL BY RT-PCR (RSV, FLU A&B, COVID)  RVPGX2
Influenza A by PCR: NEGATIVE
Influenza B by PCR: NEGATIVE
Resp Syncytial Virus by PCR: NEGATIVE
SARS Coronavirus 2 by RT PCR: NEGATIVE

## 2024-07-09 LAB — URINALYSIS, ROUTINE W REFLEX MICROSCOPIC
Bilirubin Urine: NEGATIVE
Glucose, UA: NEGATIVE mg/dL
Ketones, ur: NEGATIVE mg/dL
Nitrite: NEGATIVE
Protein, ur: 30 mg/dL — AB
RBC / HPF: >50 RBC/hpf (ref 0–5)
Specific Gravity, Urine: 1.016 (ref 1.005–1.030)
WBC, UA: >50 WBC/hpf (ref 0–5)
pH: 5 (ref 5.0–8.0)

## 2024-07-09 LAB — CBC
HCT: 38.4 % (ref 36.0–46.0)
Hemoglobin: 11.9 g/dL — ABNORMAL LOW (ref 12.0–15.0)
MCH: 31.7 pg (ref 26.0–34.0)
MCHC: 31 g/dL (ref 30.0–36.0)
MCV: 102.4 fL — ABNORMAL HIGH (ref 80.0–100.0)
Platelets: 271 K/uL (ref 150–400)
RBC: 3.75 MIL/uL — ABNORMAL LOW (ref 3.87–5.11)
RDW: 13.2 % (ref 11.5–15.5)
WBC: 14.8 K/uL — ABNORMAL HIGH (ref 4.0–10.5)
nRBC: 0 % (ref 0.0–0.2)

## 2024-07-09 LAB — CBG MONITORING, ED: Glucose-Capillary: 117 mg/dL — ABNORMAL HIGH (ref 70–99)

## 2024-07-09 LAB — COMPREHENSIVE METABOLIC PANEL WITH GFR
ALT: 68 U/L — ABNORMAL HIGH (ref 0–44)
AST: 47 U/L — ABNORMAL HIGH (ref 15–41)
Albumin: 3.3 g/dL — ABNORMAL LOW (ref 3.5–5.0)
Alkaline Phosphatase: 100 U/L (ref 38–126)
Anion gap: 14 (ref 5–15)
BUN: 17 mg/dL (ref 8–23)
CO2: 22 mmol/L (ref 22–32)
Calcium: 9.6 mg/dL (ref 8.9–10.3)
Chloride: 103 mmol/L (ref 98–111)
Creatinine, Ser: 1.01 mg/dL — ABNORMAL HIGH (ref 0.44–1.00)
GFR, Estimated: 56 mL/min — ABNORMAL LOW (ref >60)
Glucose, Bld: 133 mg/dL — ABNORMAL HIGH (ref 70–99)
Potassium: 3.6 mmol/L (ref 3.5–5.1)
Sodium: 139 mmol/L (ref 135–145)
Total Bilirubin: 0.6 mg/dL (ref 0.0–1.2)
Total Protein: 6.7 g/dL (ref 6.5–8.1)

## 2024-07-09 LAB — CULTURE, BLOOD (ROUTINE X 2)
Culture: NO GROWTH
Special Requests: ADEQUATE

## 2024-07-09 LAB — LIPASE, BLOOD: Lipase: 42 U/L (ref 11–51)

## 2024-07-09 LAB — AMMONIA: Ammonia: 16 umol/L (ref 9–35)

## 2024-07-09 LAB — GLUCOSE, CAPILLARY: Glucose-Capillary: 164 mg/dL — ABNORMAL HIGH (ref 70–99)

## 2024-07-09 LAB — TROPONIN I (HIGH SENSITIVITY)
Troponin I (High Sensitivity): 7 ng/L (ref <18)
Troponin I (High Sensitivity): 5 ng/L (ref <18)

## 2024-07-09 LAB — TSH: TSH: 6.675 u[IU]/mL — ABNORMAL HIGH (ref 0.350–4.500)

## 2024-07-09 LAB — I-STAT CG4 LACTIC ACID, ED
Lactic Acid, Venous: 1.3 mmol/L (ref 0.5–1.9)
Lactic Acid, Venous: 1.2 mmol/L (ref 0.5–1.9)

## 2024-07-09 LAB — FOLATE: Folate: 11.4 ng/mL (ref >5.9)

## 2024-07-09 LAB — GAMMA GT: GGT: 27 U/L (ref 7–50)

## 2024-07-09 MED ORDER — SODIUM CHLORIDE 0.9 % IV SOLN
2.0000 g | Freq: Once | INTRAVENOUS | Status: AC
  Administered 2024-07-09: 2 g via INTRAVENOUS
  Filled 2024-07-09: qty 12.5

## 2024-07-09 MED ORDER — VANCOMYCIN HCL 750 MG/150ML IV SOLN
750.0000 mg | INTRAVENOUS | Status: DC
  Administered 2024-07-10 – 2024-07-12 (×3): 750 mg via INTRAVENOUS
  Filled 2024-07-09 (×3): qty 150

## 2024-07-09 MED ORDER — SODIUM CHLORIDE 0.9 % IV SOLN
2.0000 g | Freq: Two times a day (BID) | INTRAVENOUS | Status: DC
  Administered 2024-07-10 – 2024-07-13 (×7): 2 g via INTRAVENOUS
  Filled 2024-07-09 (×7): qty 12.5

## 2024-07-09 MED ORDER — VANCOMYCIN HCL 1250 MG/250ML IV SOLN
1250.0000 mg | Freq: Once | INTRAVENOUS | Status: AC
  Administered 2024-07-09: 1250 mg via INTRAVENOUS
  Filled 2024-07-09 (×2): qty 250

## 2024-07-09 MED ORDER — SODIUM CHLORIDE 0.9 % IV SOLN
1.0000 g | Freq: Once | INTRAVENOUS | Status: AC
  Administered 2024-07-09: 1 g via INTRAVENOUS
  Filled 2024-07-09: qty 10

## 2024-07-09 MED ORDER — IOHEXOL 350 MG/ML SOLN
75.0000 mL | Freq: Once | INTRAVENOUS | Status: AC | PRN
  Administered 2024-07-09: 75 mL via INTRAVENOUS

## 2024-07-09 MED ORDER — ACETAMINOPHEN 325 MG PO TABS
650.0000 mg | ORAL_TABLET | Freq: Four times a day (QID) | ORAL | Status: DC | PRN
  Administered 2024-07-09 – 2024-07-14 (×13): 650 mg via ORAL
  Filled 2024-07-09 (×12): qty 2

## 2024-07-09 MED ORDER — ACETAMINOPHEN 650 MG RE SUPP: 650.0000 mg | Freq: Four times a day (QID) | RECTAL | Status: DC | PRN

## 2024-07-09 MED ORDER — LACTATED RINGERS IV SOLN: INTRAVENOUS | Status: AC

## 2024-07-09 MED ORDER — SODIUM CHLORIDE 0.9 % IV BOLUS
500.0000 mL | Freq: Once | INTRAVENOUS | Status: AC
  Administered 2024-07-09: 500 mL via INTRAVENOUS

## 2024-07-09 MED ORDER — ONDANSETRON HCL 4 MG PO TABS
4.0000 mg | ORAL_TABLET | Freq: Four times a day (QID) | ORAL | Status: DC | PRN
  Administered 2024-07-09: 4 mg via ORAL
  Filled 2024-07-09: qty 1

## 2024-07-09 MED ORDER — ONDANSETRON HCL 4 MG/2ML IJ SOLN: 4.0000 mg | Freq: Four times a day (QID) | INTRAMUSCULAR | Status: DC | PRN

## 2024-07-09 NOTE — H&P (Signed)
 History and Physical    Patient: Jacqueline Ball FMW:[id number] DOB: 05-Feb-1941 DOA: 07/09/2024 DOS: the patient was seen and examined on 07/09/2024 . PCP: Haze Kingfisher, MD  Patient coming from: Home Chief complaint: Chief Complaint  Patient presents with   Loss of Consciousness   Hypotension   HPI:  Jacqueline Ball is a 83 y.o. female with past medical history  of  paroxysmal A-fib on Eliquis , hypertension, hyperlipidemia, type 2 diabetes last A1c of 11.5, dementia, anxiety presenting with complaints of dysuria, urinary frequency/urgency, recently admitted on 07/03/2024 and discharged on 07/07/2024 for emphysematous cystitis in the setting of Klebsiella UTI on cefuroxime  500 mg twice daily, indwelling Foley, urology consulted with plans for antibiotic completion and repeat imaging to follow resolution presents today 2 days after discharge for syncopal episode patient was unarousable, on arrival blood pressure 80/40, reports of nausea and vomiting this morning.  At bedside she is awake oriented and following commands. Reports headache and neck pain.  ED Course:  Vital signs in the ED were notable for the following:  Vitals:   07/09/24 1300 07/09/24 1500 07/09/24 1600 07/09/24 1745  BP: 124/75 (!) 124/57 122/70 109/65  Pulse: 88 82 77 73  Temp:      Resp: 19 17 17 15   Height:      Weight:      SpO2: 100% 100% 100% 97%  TempSrc:      BMI (Calculated):      >>ED evaluation thus far shows: CMP shows creatinine of 1.01 glucose 133 AST of 47 ALT of 68. Ammonia of 16. Troponin of 7. / EKG today is sinus rhythm 61 PR 179 PACs present nonspecific T wave abnormalities seen in lateral leads low voltage in lead III QTc of 453 Lactic of 1.3. Abnormal CBC showing leukocytosis of 14.8 hemoglobin of 11.9 MCV of 102.4. A1c of 11.5 on 07/06/2024. TSH of 6.675. Respiratory panel is negative for flu RSV and COVID. Abnormal urinalysis today with cloudy urine large hemoglobin moderate  leukocytes more than 50 WBCs and more than 50 RBCs calcium  oxalate crystals hyaline casts present. Blood and urine cultures collected in the ED. Ct abd shows: Small hypodensities are again seen scattered throughout the liver, compatible with cysts. The gallbladder is unremarkable. There is no biliary ductal dilatation. 1. 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 2. Multiple left renal cysts with trace perinephric fluid. 3. Mild sigmoid diverticulosis.   >>While in the ED patient received the following: Medications  cefTRIAXone  (ROCEPHIN ) 1 g in sodium chloride  0.9 % 100 mL IVPB (has no administration in time range)  sodium chloride  0.9 % bolus 500 mL (0 mLs Intravenous Stopped 07/09/24 1249)  iohexol  (OMNIPAQUE ) 350 MG/ML injection 75 mL (75 mLs Intravenous Contrast Given 07/09/24 1219)   Review of Systems  Musculoskeletal:  Positive for back pain and neck pain.  Neurological:  Positive for loss of consciousness, weakness and headaches. Negative for speech change.       Syncope and collapse.    Past Medical History:  Diagnosis Date   Anxiety    Atrial fibrillation (HCC)    Diabetes mellitus    Hyperlipidemia    Hypertension    Memory loss    Mood disorder [facility])    Physical exam, annual 10/22/06   Renal cyst    Past Surgical History:  Procedure Laterality Date   ABDOMINAL HYSTERECTOMY      reports that she has never smoked. She has never used smokeless tobacco. She  reports current alcohol use of about 1.0 standard drink of alcohol per week. She reports that she does not use drugs. Allergies  Allergen Reactions   Metoprolol  Nausea And Vomiting   Family History  Problem Relation Age of Onset   Diabetes Sister    Prior to Admission medications   Medication Sig Start Date End Date Taking? Authorizing Provider  acetaminophen  (TYLENOL ) 500 MG tablet Take 1,000 mg by mouth every 6 (six) hours as needed for pain.    [provider]   apixaban  (ELIQUIS ) 2.5 MG TABS tablet Take 2.5 mg by mouth 2 (two) times daily.    [provider]  atorvastatin  (LIPITOR) 20 MG tablet Take 20 mg by mouth daily.    [provider]  Blood Glucose Monitoring Suppl DEVI 1 each by Does not apply route in the morning, at noon, and at bedtime. May substitute to any manufacturer covered by patient's insurance. 07/07/24   Rashid, Farhan, MD  cefUROXime  (CEFTIN ) 500 MG tablet Take 1 tablet (500 mg total) by mouth 2 (two) times daily with a meal for 11 days. 07/07/24 07/18/24  Rashid, Farhan, MD  donepezil  (ARICEPT ) 5 MG tablet Take 10 mg by mouth at bedtime. 01/02/24   [provider]  Glucose Blood (BLOOD GLUCOSE TEST STRIPS) STRP 1 each by In Vitro route in the morning, at noon, and at bedtime. May substitute to any manufacturer covered by patient's insurance. 07/07/24 08/06/24  Rashid, Farhan, MD  insulin  aspart (NOVOLOG ) 100 UNIT/ML FlexPen Inject 5-16 Units into the skin 3 (three) times daily with meals. If eating and Blood Glucose (BG) 80 or higher inject 5 units. If not eating, correction dose only. BG <150= 5 unit; BG 150-200= 6 unit; BG 201-250= 8 unit; BG 251-300= 10 unit; BG 301-350= 12 unit; BG 351-400= 14 unit; BG >400= 16 unit and Call Primary Care. 07/07/24   Rashid, Farhan, MD  insulin  glargine (LANTUS ) 100 UNIT/ML Solostar Pen Inject 16 Units into the skin daily. May substitute as needed per insurance. 07/07/24   Rashid, Farhan, MD  Insulin  Pen Needle (PEN NEEDLES) 31G X 5 MM MISC 1 each by Does not apply route 3 (three) times daily. May dispense any manufacturer covered by patient's insurance. 07/07/24   Rashid, Farhan, MD  Lancet Device MISC 1 each by Does not apply route in the morning, at noon, and at bedtime. May substitute to any manufacturer covered by patient's insurance. 07/07/24 08/06/24  Dino Antu, MD  Lancets Misc. MISC 1 each by Does not apply route in the morning, at noon, and at bedtime. May substitute to any  manufacturer covered by patient's insurance. 07/07/24 08/06/24  Rashid, Farhan, MD  memantine  (NAMENDA ) 10 MG tablet Take 1 tablet (10 mg at night) for 2 weeks, then increase to 1 tablet (10 mg) twice a day Patient taking differently: Take 10 mg by mouth 2 (two) times daily. 04/22/24   Wertman, Sara E, PA-C  METFORMIN  HCL PO Take 750 mg by mouth daily.    [provider]  pregabalin  (LYRICA ) 100 MG capsule Take 100 mg by mouth every evening. 03/31/24   [provider]  Vitals:   07/09/24 1300 07/09/24 1500 07/09/24 1600 07/09/24 1745  BP: 124/75 (!) 124/57 122/70 109/65  Pulse: 88 82 77 73  Resp: 19 17 17 15   Temp:      TempSrc:      SpO2: 100% 100% 100% 97%  Weight:      Height:       Physical Exam Vitals reviewed.  Constitutional:      General: She is not in acute distress.    Appearance: She is not ill-appearing.  HENT:     Head: Normocephalic and atraumatic.  Eyes:     Extraocular Movements: Extraocular movements intact.     Pupils: Pupils are equal, round, and reactive to light.     Comments: Pupils 1mm bl.   Cardiovascular:     Rate and Rhythm: Normal rate and regular rhythm.     Heart sounds: Normal heart sounds.  Pulmonary:     Effort: Pulmonary effort is normal.     Breath sounds: Normal breath sounds.  Abdominal:     General: There is no distension.     Palpations: Abdomen is soft.     Tenderness: There is no abdominal tenderness.  Musculoskeletal:     Cervical back: Pain with movement present.     Right lower leg: No edema.     Left lower leg: No edema.  Neurological:     General: No focal deficit present.     Mental Status: She is alert and oriented to person, place, and time.     Cranial Nerves: Cranial nerves 2-12 are intact. No dysarthria or facial asymmetry.     Motor: Weakness present. No tremor or abnormal muscle tone.  Psychiatric:        Attention and  Perception: Attention normal.        Mood and Affect: Mood normal.        Speech: Speech normal.        Behavior: Behavior normal. Behavior is cooperative.     Labs on Admission: I have personally reviewed following labs and imaging studies CBC: Recent Labs  Lab 07/03/24 1619 07/04/24 0209 07/05/24 0256 07/06/24 0246 07/09/24 1049  WBC 11.7* 10.1 8.1 7.6 14.8*  NEUTROABS 9.8*  --   --  4.8  --   HGB 12.5 11.9* 10.1* 10.2* 11.9*  HCT 37.8 34.4* 30.2* 30.3* 38.4  MCV 96.2 91.2 93.2 93.5 102.4*  PLT 273 242 213 205 271   Basic Metabolic Panel: Recent Labs  Lab 07/03/24 1619 07/04/24 0209 07/05/24 0256 07/06/24 0246 07/09/24 1049  NA 134* 138 135 137 139  K 4.0 3.0* 3.5 4.0 3.6  CL 101 107 105 105 103  CO2 19* 23 22 23 22   GLUCOSE 451* 167* 260* 368* 133*  BUN 19 15 19 12 17   CREATININE 1.28* 0.74 0.90 0.84 1.01*  CALCIUM  9.5 9.2 8.6* 8.6* 9.6   GFR: Estimated Creatinine Clearance: 36.6 mL/min (A) (by C-G formula based on SCr of 1.01 mg/dL (H)). Liver Function Tests: Recent Labs  Lab 07/03/24 1619 07/09/24 1049  AST 29 47*  ALT 32 68*  ALKPHOS 129* 100  BILITOT 0.8 0.6  PROT 7.0 6.7  ALBUMIN 3.8 3.3*   Recent Labs  Lab 07/03/24 1619 07/09/24 1049  LIPASE 60* 42   Recent Labs  Lab 07/09/24 1050  AMMONIA 16   Coagulation Profile: No results for input(s): INR, PROTIME in the last 168 hours. Cardiac Enzymes: No results for input(s): CKTOTAL, CKMB, CKMBINDEX, TROPONINI in the last  168 hours. BNP (last 3 results) No results for input(s): PROBNP in the last 8760 hours. HbA1C: No results for input(s): HGBA1C in the last 72 hours. CBG: Recent Labs  Lab 07/06/24 2120 07/07/24 0613 07/07/24 0806 07/07/24 1102 07/09/24 1024  GLUCAP 285* 295* 348* 162* 117*   Lipid Profile: No results for input(s): CHOL, HDL, LDLCALC, TRIG, CHOLHDL, LDLDIRECT in the last 72 hours. Thyroid  Function Tests: Recent Labs    07/09/24 1050   TSH 6.675*   Anemia Panel: Recent Labs    07/09/24 1251  FOLATE 11.4   Urine analysis:    Component Value Date/Time   COLORURINE YELLOW 07/09/2024 1045   APPEARANCEUR CLOUDY (A) 07/09/2024 1045   LABSPEC 1.016 07/09/2024 1045   PHURINE 5.0 07/09/2024 1045   GLUCOSEU NEGATIVE 07/09/2024 1045   HGBUR LARGE (A) 07/09/2024 1045   BILIRUBINUR NEGATIVE 07/09/2024 1045   BILIRUBINUR negative 07/16/2023 1939   KETONESUR NEGATIVE 07/09/2024 1045   PROTEINUR 30 (A) 07/09/2024 1045   UROBILINOGEN 0.2 07/16/2023 1939   UROBILINOGEN 0.2 04/02/2020 1041   NITRITE NEGATIVE 07/09/2024 1045   LEUKOCYTESUR MODERATE (A) 07/09/2024 1045   Radiological Exams on Admission: CT CERVICAL SPINE WO CONTRAST Result Date: 07/09/2024 CLINICAL DATA:  neck pain not sure if we can see c spine on her ct head or if we can do it as no charge. thanks EXAM: CT CERVICAL SPINE WITHOUT CONTRAST TECHNIQUE: Multidetector CT imaging of the cervical spine was performed without intravenous contrast. Multiplanar CT image reconstructions were also generated. RADIATION DOSE REDUCTION: This exam was performed according to the departmental dose-optimization program which includes automated exposure control, adjustment of the mA and/or kV according to patient size and/or use of iterative reconstruction technique. COMPARISON:  CT C-spine 12/28/2022. FINDINGS: Alignment: Reversal of the normal cervical lordosis centered at the C3-C5 levels likely due to positioning and degenerative changes. Grade 1 anterolisthesis of C2 on C3. Mild retrolisthesis of C5 on C6 and C6 on C7. Skull base and vertebrae: Slight motion artifact. Multilevel moderate degenerative changes spine. No associated severe osseous neural foraminal or central canal stenosis. No acute fracture. No aggressive appearing focal osseous lesion or focal pathologic process. Soft tissues and spinal canal: No prevertebral fluid or swelling. No visible canal hematoma. Upper chest:  Unremarkable. Other: None. IMPRESSION: No acute displaced fracture or traumatic listhesis of the cervical spine. Slightly limited by motion artifact. Electronically Signed   By: Morgane  Naveau M.D.   On: 07/09/2024 18:04   CT ABDOMEN PELVIS W CONTRAST Result Date: 07/09/2024 CLINICAL DATA:  Recent mission for UTI and also some abnormal free air in abdomen previously. Abdominal tenderness hypotension and syncope. EXAM: CT ABDOMEN AND PELVIS WITH CONTRAST TECHNIQUE: Multidetector CT imaging of the abdomen and pelvis was performed using the standard protocol following bolus administration of intravenous contrast. RADIATION DOSE REDUCTION: This exam was performed according to the departmental dose-optimization program which includes automated exposure control, adjustment of the mA and/or kV according to patient size and/or use of iterative reconstruction technique. CONTRAST:  75mL OMNIPAQUE  IOHEXOL  350 MG/ML SOLN COMPARISON:  CT of the abdomen and pelvis dated July 03, 2024. FINDINGS: Lower chest: Mild dependent atelectasis within the lower lobes bilaterally. The nondependent lungs are clear. Hepatobiliary: Small hypodensities are again seen scattered throughout the liver, compatible with cysts. The gallbladder is unremarkable. There is no biliary ductal dilatation. Pancreas: Normal. Spleen: Normal. Adrenals/Urinary Tract: There is mild left renal cortical atrophy and there are few renal cysts present. Trace left perinephric fluid. The  ureters are normal in caliber. A Foley catheter is present within the collapsed urinary bladder. 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 There is a small volume of air present within the bladder lumen anteriorly. There are few bubbles of air within the surrounding perivesicular fat. The adrenal glands remain unremarkable. Stomach/Bowel: There are few scattered colonic diverticula present. There is no evidence of diverticulitis. The stomach, small bowel and  appendix are unremarkable. Vascular/Lymphatic: Moderate calcific atheromatous disease within the abdominal aorta. The mesenteric and renal arteries are widely patent. The inferior vena cava is unremarkable. There is no lymphadenopathy. Reproductive: Status post hysterectomy. No adnexal masses. Other: None. Musculoskeletal: No osseous lesions. IMPRESSION: 1. 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 2. Multiple left renal cysts with trace perinephric fluid. 3. Mild sigmoid diverticulosis. Electronically Signed   By: Evalene Coho M.D.   On: 07/09/2024 12:41   CT Head Wo Contrast Result Date: 07/09/2024 CLINICAL DATA:  Mental status change, persistent or worsening Hypotension and syncope this morning. Recent mission for UTI and abnormal abdominal CT. Distally. EXAM: CT HEAD WITHOUT CONTRAST TECHNIQUE: Contiguous axial images were obtained from the base of the skull through the vertex without intravenous contrast. RADIATION DOSE REDUCTION: This exam was performed according to the departmental dose-optimization program which includes automated exposure control, adjustment of the mA and/or kV according to patient size and/or use of iterative reconstruction technique. COMPARISON:  CT the head dated March 15, 2024. FINDINGS: Brain: Age-related atrophy and mild periventricular white matter disease. No evidence of hemorrhage, mass, acute cortical infarct or hydrocephalus. Vascular: Mild vascular calcifications Skull: Intact and unremarkable. Sinuses/Orbits: The visualized paranasal sinuses and mastoid air cells are clear. Patient is status post bilateral lens replacement. Other: None. IMPRESSION: Age-related atrophy and mild periventricular white matter disease. No apparent acute process. Electronically Signed   By: Evalene Coho M.D.   On: 07/09/2024 12:30   DG Chest Portable 1 View Result Date: 07/09/2024 CLINICAL DATA:  Syncope, hypertension. EXAM: PORTABLE CHEST 1 VIEW  COMPARISON:  March 15, 2024. FINDINGS: The heart size and mediastinal contours are within normal limits. Both lungs are clear. The visualized skeletal structures are unremarkable. IMPRESSION: No active disease. Electronically Signed   By: Lynwood Landy Raddle M.D.   On: 07/09/2024 11:29   Data Reviewed: Relevant notes from primary care and specialist visits, past discharge summaries as available in EHR, including Care Everywhere . Prior diagnostic testing as pertinent to current admission diagnoses, Updated medications and problem lists for reconciliation .ED course, including vitals, labs, imaging, treatment and response to treatment,Triage notes, nursing and pharmacy notes and ED provider's notes.Notable results as noted in HPI.Discussed case with EDMD/ ED APP/ or Specialty MD on call and as needed.  Assessment & Plan  >> Syncope and collapse: Differentials include sepsis and hypotension, dehydration and hypotension, medication related and hypertension.  Initial head CT is  negative for any acute findings and shows age-related atrophy and mild periventricular white matter disease with no apparent acute process. Fall precautions.monitoring heart rate and rhythm.Head ct is negative. Ct spine is negative for any acute fracture or finding does shows chronic  DDD.We will obtain 2 d echo and CTA head and neck.Appreciate neurology consult and management. Vitals:   07/09/24 1045 07/09/24 1115 07/09/24 1130 07/09/24 1245  BP: (!) 147/77 133/81 (!) 143/77 137/71   07/09/24 1300 07/09/24 1500 07/09/24 1600 07/09/24 1745  BP: 124/75 (!) 124/57 122/70 109/65     >> Headache/ Neck  pain: Patient at bedside complains of severe headache in the back of her head and neck and having difficulty flexing her neck  when I reach to touch her neck she is not able to flex and is in pain.  Will discuss with neurology and proceed with MRI of the brain noncontrast and a C-spine evaluation although there is no report of trauma  patient is on low-dose Eliquis .  Also with her history of cyst and hepatic cyst, evaluation of aneurysms with angiogram of the brain is scheduled for AM after d/w  with neurology and ordered , less likely [facility] is also possible with her syncope and following headache and neck pain.    >> Sepsis/acute emphysematous cystitis secondary to Klebsiella UTI: Pt does meet sepsis criteria with RR/ WBC count and abnormal urinalysis c/w uti.  The emphysematous cystitis is almost resolved.  Cont with LR at 75 x 1 day. Follow C/S . Change abx for broad spectrum coverage with vancomycin  and cefepime .   [START ON 07/10/2024] ceFEPime  (MAXIPIME ) IV     lactated ringers  40 mL/hr at 07/09/24 1433   [START ON 07/10/2024] vancomycin       >> Pneumoperitoneum: Resolution of pneumoperitoneum.   >>Transaminitis: Mild suspect from hypotension.  Cont with gentl IVF hydration overnight.     Latest Ref Rng & Units 07/09/2024   10:49 AM 07/03/2024    4:19 PM 05/05/2024    6:28 PM  Hepatic Function  Total Protein 6.5 - 8.1 g/dL 6.7  7.0  6.8   Albumin 3.5 - 5.0 g/dL 3.3  3.8  3.7   AST 15 - 41 U/L 47  29  19   ALT 0 - 44 U/L 68  32  18   Alk Phosphatase 38 - 126 U/L 100  129  87   Total Bilirubin 0.0 - 1.2 mg/dL 0.6  0.8  0.7      >> Uncontrolled diabetes mellitus type 2: Glycemic protocol.swallow eval.  Last A1c of 11.5: Avoid glucosuric meds.  Long acting insulin  and will consider Januvia and actos or per d/c MD.    >> Alzheimer's dementia: Will do a bedside swallow and continue patient on Aricept .    >> Persistent atrial fibrillation: Currently in sinus rhythm, Eliquis  ordered but will currently hold until  neuro evaluation and then resume as deemed appropriate. Pt took her last dose of eliquis  and we will start heparin  for unit her neuro imaging is resulted tomorrow.     >>Anemia: Mild and stable anemia since 2015 suspect anemia of chronic disease.  Will defer to PCP to follow and  manage.   DVT prophylaxis:  SCDs Consults:  Neuro  Advance Care Planning:    Code Status: Full Code   Family Communication:  Son Disposition Plan:  Home Severity of Illness: The appropriate patient status for this patient is INPATIENT. Inpatient status is judged to be reasonable and necessary in order to provide the required intensity of service to ensure the patient's safety. The patient's presenting symptoms, physical exam findings, and initial radiographic and laboratory data in the context of their chronic comorbidities is felt to place them at high risk for further clinical deterioration. Furthermore, it is not anticipated that the patient will be medically stable for discharge from the hospital within 2 midnights of admission.   * I certify that at the point of admission it is my clinical judgment that the patient will require inpatient hospital care spanning beyond 2 midnights from the point of admission due to  high intensity of service, high risk for further deterioration and high frequency of surveillance required.*  Unresulted Labs (From admission, onward)     Start     Ordered   07/10/24 0500  Comprehensive metabolic panel  Tomorrow morning,   R        07/09/24 1344   07/10/24 0500  CBC  Tomorrow morning,   R        07/09/24 1344   07/09/24 1837  Gamma GT  Once,   R        07/09/24 1836   07/09/24 1836  Protime-INR  Add-on,   AD        07/09/24 1835   07/09/24 1433  T4, free  Add-on,   AD        07/09/24 1433   07/09/24 1433  Vitamin B12  Add-on,   AD        07/09/24 1433   07/09/24 1410  CK  Add-on,   AD        07/09/24 1409   07/09/24 1344  Culture, Urine (Do not remove urinary catheter, catheter placed by urology or difficult to place)  (Urine Culture)  Once,   R       Question:  Indication  Answer:  Altered mental status (if no other cause identified)   07/09/24 1344   07/09/24 1340  C-reactive protein  Add-on,   AD        07/09/24 1339            Meds  ordered this encounter  Medications   sodium chloride  0.9 % bolus 500 mL   iohexol  (OMNIPAQUE ) 350 MG/ML injection 75 mL   cefTRIAXone  (ROCEPHIN ) 1 g in sodium chloride  0.9 % 100 mL IVPB    Antibiotic Indication::   UTI   OR Linked Order Group    acetaminophen  (TYLENOL ) tablet 650 mg    acetaminophen  (TYLENOL ) suppository 650 mg   OR Linked Order Group    ondansetron  (ZOFRAN ) tablet 4 mg    ondansetron  (ZOFRAN ) injection 4 mg   lactated ringers  infusion   ceFEPIme  (MAXIPIME ) 2 g in sodium chloride  0.9 % 100 mL IVPB    Antibiotic Indication::   Other Indication (list below)    Other Indication::   emphysematous cystitis / PNA   vancomycin  (VANCOREADY) IVPB 1250 mg/250 mL    Indication::   Other Indication (list below)    Other Indication::   emphysematous cystitis   ceFEPIme  (MAXIPIME ) 2 g in sodium chloride  0.9 % 100 mL IVPB    Antibiotic Indication::   Other Indication (list below)   vancomycin  (VANCOREADY) IVPB 750 mg/150 mL    Indication::   Other Indication (list below)     Orders Placed This Encounter  Procedures   Blood culture (routine x 2)   Resp panel by RT-PCR (RSV, Flu A&B, Covid) Anterior Nasal Swab   Culture, Urine (Do not remove urinary catheter, catheter placed by urology or difficult to place)   DG Chest Portable 1 View   CT ABDOMEN PELVIS W CONTRAST   CT Head Wo Contrast   CT CERVICAL SPINE WO CONTRAST   CT ANGIO HEAD W OR WO CONTRAST   CT ANGIO NECK W OR WO CONTRAST   Comprehensive metabolic panel   CBC   Urinalysis, Routine w reflex microscopic -Urine, Clean Catch   Lipase, blood   TSH   Ammonia   C-reactive protein   Comprehensive metabolic panel   CBC   CK  T4, free   Vitamin B12   Folate   Protime-INR   Gamma GT   Diet Carb Modified Fluid consistency: Nectar Thick; Room service appropriate? Yes with Assist   Document Height and Actual Weight   Cardiac Monitoring Continuous x 48 hours Indications for use: Syncope of unknown etiology   Vital  signs   Notify physician (specify)   Mobility Protocol: No Restrictions RN to initiate protocols based on patient's level of care   Refer to Sidebar Report Refer to ICU, Med-Surg, Progressive, and Step-Down Mobility Protocol Sidebars   Initiate Adult Central Line Maintenance and Catheter Protocol for patients with central line (CVC, PICC, Port, Hemodialysis, Trialysis)   Daily weights   Intake and Output   Initiate CHG Protocol   Do not place and if present remove PureWick   Initiate Oral Care Protocol   Initiate Carrier Fluid Protocol   RN may order General Admission PRN Orders utilizing General Admission PRN medications (through manage orders) for the following patient needs: allergy symptoms (Claritin), cold sores (Carmex), cough (Robitussin DM), eye irritation (Liquifilm Tears), hemorrhoids (Tucks), indigestion (Maalox), minor skin irritation (Hydrocortisone Cream), muscle pain Lucienne Gay), nose irritation (saline nasal spray) and sore throat (Chloraseptic spray).   Neuro checks   Swallow screen   Full code   Consult to hospitalist   ceFEPIme  (MAXIPIME ) per pharmacy consult            vancomycin  per pharmacy consult   Pulse oximetry check with vital signs   Oxygen therapy Mode or (Route): Nasal cannula; Liters Per Minute: 2; Keep O2 saturation between: greater than 92 %   CBG monitoring, ED   CBG monitoring, ED   I-Stat CG4 Lactic Acid   ED EKG   EKG 12-Lead   ECHOCARDIOGRAM COMPLETE BUBBLE STUDY   Admit to Inpatient (patient's expected length of stay will be greater than 2 midnights or inpatient only procedure)   Aspiration precautions   Fall precautions    Author: Mario LULLA Blanch, MD 12 pm- 8 pm. [facility]. 07/09/2024 6:51 PM Please note for any communication after hours contact [facility] Assigned provider on call on Amion.

## 2024-07-09 NOTE — ED Notes (Signed)
 Patient transported to CT

## 2024-07-09 NOTE — Progress Notes (Signed)
 Pharmacy Antibiotic Note  Jacqueline Ball is a 83 y.o. female admitted on 07/09/2024 with UTI.  Pharmacy has been consulted for vancomycin  and cefepime  dosing.   Pt has recurrent UTIs and was recently hospitalized here with emphysematous cystitis and received ceftriaxone  and flagyl  x 4 days, with urine culture growing amp-R klebsiella pneumoniae. Was discharged 7/22 with foley catheter and cefuroxime  to finish total of 14-day course. Now presents following syncopal episode with hypotension now resolved with fluids, new leukocytosis, and new headache/neck pain.   Per provider, covering for hospital-acquired infections, emphysematous cystitis, and CAUTI. Provider mentioned she is exploring possible meningitis, but does not want meningitis ampicillin /acyclovir at this time   Plan: - received ceftriaxone  1g IV x1 - give vancomycin  1250mg  IV x1 as LD - start vancomycin  750 Q24h, est AUC 554. Targeting higher AUC and referenced nomogram due to thought of possible meningitis.  - start cefepime  2g Q12h - monitor Scr, WBC, and s/sx of improvement - evaluate opportunities to de-escalate antimicrobials as indicated   Height: 5' 5 (165.1 cm) Weight: 54 kg (119 lb 0.8 oz) IBW/kg (Calculated) : 57  Temp (24hrs), Avg:98.7 F (37.1 C), Min:98.7 F (37.1 C), Max:98.7 F (37.1 C)  Recent Labs  Lab 07/03/24 1619 07/03/24 1951 07/03/24 2349 07/04/24 0209 07/05/24 0256 07/06/24 0246 07/09/24 1049 07/09/24 1113 07/09/24 1302  WBC 11.7*  --   --  10.1 8.1 7.6 14.8*  --   --   CREATININE 1.28*  --   --  0.74 0.90 0.84 1.01*  --   --   LATICACIDVEN  --  4.0* 1.3  --   --   --   --  1.3 1.2    Estimated Creatinine Clearance: 36.6 mL/min (A) (by C-G formula based on SCr of 1.01 mg/dL (H)).    Allergies  Allergen Reactions   Metoprolol  Nausea And Vomiting    Antimicrobials this admission: Ceftriaxone  7/23 x1 Cefepime , vancomycin  7/23 >>  Dose adjustments this  admission: N/A  Microbiology results: 7/23 BCx: in process 7/23 UCx: in process 7/18 Ucx:  Amp-R K. Pneumoniae    Thank you for allowing pharmacy to be a part of this patient's care.  Belvie Macintosh, PharmD Candidate  07/09/2024 2:19 PM

## 2024-07-09 NOTE — ED Triage Notes (Addendum)
 Pt BIB [facility] from home.Family reported syncopal episode in bed and unable to rouse. On arrival 80/40 with pt reported weakness. Reporting GI upset since this am. D/C yesterday from hospital for UTI, foley currently in place. Hx of dementia but can answer some questions. Lt  AC 20g w 150 NS given en route.  [facility] VS BP 122/76 65 HR  98 O2 CBG 134

## 2024-07-09 NOTE — Consult Note (Addendum)
 NEUROLOGY CONSULT NOTE   Date of service: July 09, 2024 Patient Name: Jacqueline Ball MRN:  [id number] DOB:  03-Jan-1941 Chief Complaint: syncope, headache, and neck pain Requesting Provider: Tobie Mario GAILS, MD  History of Present Illness  Jacqueline Ball is a 83 y.o. female with hx of paroxysmal A-fib, HTN, T2DM, dementia, and anxiety who was admitted from 07/03/2024 - 07/07/2024 [facility] for acute emphysematous cystitis thought to be caused by recent Klebsiella UTI.  Today she is presenting after she could not be woken up this morning; her blood pressure was found to be 80/40 and she had concurrent headache and neck pain.  During her recent admission she was treated with ceftriaxone  and discharged on cefuroxime  with a planned total antibiotic duration of 2 weeks.  Blood cultures from that admission showed no growth.  Otherwise her Eliquis  was held in the setting of hematuria with plans to resume this once her hematuria had resolved.  She was discharged with an indwelling Foley that was still in place upon ED evaluation.  On ED evaluation she was started on treatment for persistent UTI with UA showing hemoglobin, leukocytes, and bacteria.  CT of the abdomen and pelvis showed resolution of pneumoperitoneum seen during recent admission.  CT head without any acute abnormalities and no evidence of pneumonia on chest x-ray.  On my evaluation the patient is complaining of primarily right occipital/cervical pain and left temporal pain but otherwise denies photophobia, change in vision, focal weakness, change in sensation, or nausea.  She was discharged 2 days ago and has been at home with her son who she usually lives with.  He is in the room to provide history as well.  Denies any recent falls, she has not been able to ambulate like she was able to before her recent hospitalization.  She has been taking cefuroxime  and cefdinir  and it does not appear that she stopped her Eliquis  on  discharge.  On my exam she is having some left posterior knee pain with movement but had not noticed this before then.  Her head/neck pain does not bother her when she does not move her head or neck and the pain worsens with palpation as well as movement.    ROS  Comprehensive ROS performed and pertinent positives documented in HPI   Past History   Past Medical History:  Diagnosis Date   Anxiety    Atrial fibrillation (HCC)    Diabetes mellitus    Hyperlipidemia    Hypertension    Memory loss    Mood disorder [facility])    Physical exam, annual 10/22/06   Renal cyst     Past Surgical History:  Procedure Laterality Date   ABDOMINAL HYSTERECTOMY      Family History: Family History  Problem Relation Age of Onset   Diabetes Sister     Social History  reports that she has never smoked. She has never used smokeless tobacco. She reports current alcohol use of about 1.0 standard drink of alcohol per week. She reports that she does not use drugs.  Allergies  Allergen Reactions   Metoprolol  Nausea And Vomiting    Medications   Current Facility-Administered Medications:    acetaminophen  (TYLENOL ) tablet 650 mg, 650 mg, Oral, Q6H PRN **OR** acetaminophen  (TYLENOL ) suppository 650 mg, 650 mg, Rectal, Q6H PRN, Tobie, Ekta V, MD   ceFEPIme  (MAXIPIME ) 2 g in sodium chloride  0.9 % 100 mL IVPB, 2 g, Intravenous, Once, Tobie Mario GAILS, MD   lactated ringers   infusion, , Intravenous, Continuous, Patel, Mario GAILS, MD   ondansetron  (ZOFRAN ) tablet 4 mg, 4 mg, Oral, Q6H PRN **OR** ondansetron  (ZOFRAN ) injection 4 mg, 4 mg, Intravenous, Q6H PRN, Tobie, Mario GAILS, MD   vancomycin  (VANCOREADY) IVPB 1250 mg/250 mL, 1,250 mg, Intravenous, Once, Tobie Mario GAILS, MD  Current Outpatient Medications:    acetaminophen  (TYLENOL ) 500 MG tablet, Take 1,000 mg by mouth every 6 (six) hours as needed for pain., Disp: , Rfl:    apixaban  (ELIQUIS ) 2.5 MG TABS tablet, Take 2.5 mg by mouth 2 (two) times daily., Disp: , Rfl:     atorvastatin  (LIPITOR) 20 MG tablet, Take 20 mg by mouth daily., Disp: , Rfl:    Blood Glucose Monitoring Suppl DEVI, 1 each by Does not apply route in the morning, at noon, and at bedtime. May substitute to any manufacturer covered by patient's insurance., Disp: 1 each, Rfl: 0   cefUROXime  (CEFTIN ) 500 MG tablet, Take 1 tablet (500 mg total) by mouth 2 (two) times daily with a meal for 11 days., Disp: 22 tablet, Rfl: 0   donepezil  (ARICEPT ) 5 MG tablet, Take 10 mg by mouth at bedtime., Disp: , Rfl:    Glucose Blood (BLOOD GLUCOSE TEST STRIPS) STRP, 1 each by In Vitro route in the morning, at noon, and at bedtime. May substitute to any manufacturer covered by patient's insurance., Disp: 100 strip, Rfl: 0   insulin  aspart (NOVOLOG ) 100 UNIT/ML FlexPen, Inject 5-16 Units into the skin 3 (three) times daily with meals. If eating and Blood Glucose (BG) 80 or higher inject 5 units. If not eating, correction dose only. BG <150= 5 unit; BG 150-200= 6 unit; BG 201-250= 8 unit; BG 251-300= 10 unit; BG 301-350= 12 unit; BG 351-400= 14 unit; BG >400= 16 unit and Call Primary Care., Disp: 15 mL, Rfl: 0   insulin  glargine (LANTUS ) 100 UNIT/ML Solostar Pen, Inject 16 Units into the skin daily. May substitute as needed per insurance., Disp: 15 mL, Rfl: 0   Insulin  Pen Needle (PEN NEEDLES) 31G X 5 MM MISC, 1 each by Does not apply route 3 (three) times daily. May dispense any manufacturer covered by patient's insurance., Disp: 100 each, Rfl: 0   Lancet Device MISC, 1 each by Does not apply route in the morning, at noon, and at bedtime. May substitute to any manufacturer covered by patient's insurance., Disp: 1 each, Rfl: 0   Lancets Misc. MISC, 1 each by Does not apply route in the morning, at noon, and at bedtime. May substitute to any manufacturer covered by patient's insurance., Disp: 100 each, Rfl: 0   memantine  (NAMENDA ) 10 MG tablet, Take 1 tablet (10 mg at night) for 2 weeks, then increase to 1 tablet (10 mg)  twice a day (Patient taking differently: Take 10 mg by mouth 2 (two) times daily.), Disp: 180 tablet, Rfl: 3   METFORMIN  HCL PO, Take 750 mg by mouth daily., Disp: , Rfl:    pregabalin  (LYRICA ) 100 MG capsule, Take 100 mg by mouth every evening., Disp: , Rfl:   Vitals   Vitals:   07/09/24 1115 07/09/24 1130 07/09/24 1245 07/09/24 1300  BP: 133/81 (!) 143/77 137/71 124/75  Pulse:    88  Resp: 18 17 (!) 23 19  Temp:      TempSrc:      SpO2:    100%  Weight:      Height:        Body mass index is 19.81 kg/m.  Physical Exam    Physical Exam Gen: A&O to person and place but not time or situation, NAD HEENT: Atraumatic, normocephalic; oropharynx clear, tongue without atrophy or fasciculations.  Pain extending down into the mid neck with neck flexion and rotation bidirectionally.  Decreased active range of motion in the neck due to pain, but neck is supple when passively rotated and flexed by examiner. Tenderness to palpation in the upper cervical paraspinal musculature and occipital area primarily on the right.  Also with bilateral parietal tenderness to palpation. No tenderness to palpation of the frontalis and temporalis muscles. Negative Brudzinski and Kernig's signs. Resp:  normal work of breathing CV:  extremities appear well-perfused. Extrem: Nml bulk; no cyanosis, clubbing, or edema.  Negative Kernig sign  Neuro: *MS: A&O to person and place but not time or situation. Follows multi-step commands, sometimes needs repeating  *Speech: no dysarthria or aphasia, able to name and repeat. *CN:    I: Deferred   II: Pupils constricted but reactive and equal, VFF by confrontation, optic discs not visualized 2/2 pupillary constriction   III,IV,VI: EOMI w/o nystagmus, no ptosis   V: Sensation intact from V1 to V3 to LT   VII: Eyelid closure was full.  Smile symmetric.   VIII: Hearing intact to voice   IX,X: Voice normal, palate elevates symmetrically    XI: trap 5/5 bilat, SCM 4/5  bilaterally due to pain XII: Tongue protrudes midline, no atrophy or fasciculations  *Motor:   Normal bulk.  No tremor, rigidity or bradykinesia. No pronator drift. Left hip flexion limited by pain in her left thigh/knee   Strength: Dlt Bic Tri WE WrF FgS Gr HF KnF KnE PlF DoF    Left 5 5 5 5 5 5 5  4+ 5 5 5 5     Right 5 5 5 5 5 5 5 5 5 5 5 5    *Sensory: Intact to light touch, pinprick, temperature vibration throughout. Symmetric. Propioception intact bilat.  No double-simultaneous extinction.  *Coordination:  Finger-to-nose, heel-to-shin, rapid alternating motions were intact. *Reflexes:  2+ and symmetric in bilateral upper extremities. 0 bilateral patellar and achilles reflexes. toes down-going bilat *Gait: Deferred  Labs/Imaging/Neurodiagnostic studies   CBC:  Recent Labs  Lab 07/13/2024 1619 07/04/24 0209 07/06/24 0246 07/09/24 1049  WBC 11.7*   < > 7.6 14.8*  NEUTROABS 9.8*  --  4.8  --   HGB 12.5   < > 10.2* 11.9*  HCT 37.8   < > 30.3* 38.4  MCV 96.2   < > 93.5 102.4*  PLT 273   < > 205 271   < > = values in this interval not displayed.   Basic Metabolic Panel:  Lab Results  Component Value Date   NA 139 07/09/2024   K 3.6 07/09/2024   CO2 22 07/09/2024   GLUCOSE 133 (H) 07/09/2024   BUN 17 07/09/2024   CREATININE 1.01 (H) 07/09/2024   CALCIUM  9.6 07/09/2024   GFRNONAA 56 (L) 07/09/2024   GFRAA >60 06/06/2020   Lipid Panel:  Lab Results  Component Value Date   LDLCALC 53 11/23/2014   HgbA1c:  Lab Results  Component Value Date   HGBA1C 11.5 (H) 07/06/2024   Urine Drug Screen:     Component Value Date/Time   LABOPIA NONE DETECTED 03/15/2024 1914   COCAINSCRNUR NONE DETECTED 03/15/2024 1914   LABBENZ NONE DETECTED 03/15/2024 1914   AMPHETMU NONE DETECTED 03/15/2024 1914   THCU NONE DETECTED 03/15/2024 1914   LABBARB NONE DETECTED 03/15/2024  1914    CT Head without contrast(Personally reviewed): Age-related atrophy and mild periventricular white matter  disease. No apparent acute process.  ASSESSMENT  Ranika Mcniel is an 83 y.o. female with hx of paroxysmal A-fib (on Eliquis ), HTN, T2DM, dementia, and anxiety who was admitted from 07/03/2024 - 07/07/2024 [facility] for acute emphysematous cystitis thought to be caused by recent Klebsiella UTI presenting with signs of systemic infection and concern for meningitis with head and neck pain.  Patient was treated with a third-generation cephalosporin from 07/03/2024 - 07/07/2024 and discharged on a second generation cephalosporin but also took a third-generation cephalosporin at home 07/08/2024-07/09/2024.  Today she is re-presenting after she could not be woken up this morning; her blood pressure was found to be 80/40 and she had concurrent headache and neck pain.  - Exam reveals TTP of posterior neck muscles bilaterally in conjunction with pain on volitional and passive head flexion and rotation, but no meningismus. Posterior neck muscles and parietal skull are TTP as well. Mentation is essentially normal and she has no fever.  - CT of the head without contrast on admission showed no acute processes.  - CT of cervical spine: No acute displaced fracture or traumatic listhesis of the cervical spine. Slightly limited by motion artifact.  - Impression: She appears near baseline mental status per son in her room.  Her exam is not consistent with meningitis but rather musculoskeletal pain more likely.  Negative Kernig and Brudzinski sign. She is also having left knee pain and possibly left leg weakness most prominent with hip flexion but this seems more pain limited on reexamination. Presentation is very atypical for meningitis  although she does have signs of systemic infection and should continue to get broad-spectrum antibiotics. No indication for LP from a risk/benefit standpoint. A small SAH not detectable by CT is possible, but felt to be unlikely. Cervicogenic headache due to muscle spasm is felt to  be the most likely etiology.   RECOMMENDATIONS  - Continue broad-spectrum antibiotics - MRI of the brain with and without contrast - CTA of head and neck. Likely to be low-yield, but will be useful to rule out posterior circulation dissection or intracranial aneurysm.   - Hold off on restarting Eliquis  until CTA is obtained to rule out aneurysm.  - BP management with IVF and encourage hydration.  ______________________________________________________________________    Bonney Fairy Pool, DO Internal Medicine Resident, PGY-3 2:12 PM 07/09/2024  I have seen and examined the patient. I have formulated the assessment and recommendations. 83 year old female with recent UTI, re-presenting after she could not be woken up this AM in the context of hypotension. Neurology called to evaluate for headache and neck pain. Overall exam findings are not consistent with meningitis. Cervicogenic headache due to muscle spasm is felt to be the most likely etiology. Recommendations as above.  Electronically signed: Dr. Florenda Watt

## 2024-07-09 NOTE — ED Notes (Signed)
 Neurology MD at bedside

## 2024-07-09 NOTE — ED Provider Notes (Signed)
 [place] EMERGENCY DEPARTMENT AT [facility] Provider Note   CSN: [id number] Arrival date & time: 07/09/24  1014     Patient presents with: Loss of Consciousness and Hypotension   Jacqueline Ball is a 83 y.o. female.   The history is provided by the patient and medical records. No language interpreter was used.  Loss of Consciousness Episode history:  Single Timing:  Unable to specify Progression:  Partially resolved Chronicity:  New Relieved by:  Nothing Worsened by:  Nothing Ineffective treatments:  None tried Associated symptoms: confusion, malaise/fatigue and nausea   Associated symptoms: no chest pain, no difficulty breathing, no fever, no headaches, no palpitations, no shortness of breath and no vomiting        Prior to Admission medications   Medication Sig Start Date End Date Taking? Authorizing Provider  acetaminophen  (TYLENOL ) 500 MG tablet Take 1,000 mg by mouth every 6 (six) hours as needed for pain.    [provider]  apixaban  (ELIQUIS ) 2.5 MG TABS tablet Take 2.5 mg by mouth 2 (two) times daily.    [provider]  atorvastatin  (LIPITOR) 20 MG tablet Take 20 mg by mouth daily.    [provider]  Blood Glucose Monitoring Suppl DEVI 1 each by Does not apply route in the morning, at noon, and at bedtime. May substitute to any manufacturer covered by patient's insurance. 07/07/24   Rashid, Farhan, MD  cefUROXime  (CEFTIN ) 500 MG tablet Take 1 tablet (500 mg total) by mouth 2 (two) times daily with a meal for 11 days. 07/07/24 07/18/24  Rashid, Farhan, MD  donepezil  (ARICEPT ) 5 MG tablet Take 10 mg by mouth at bedtime. 01/02/24   [provider]  Glucose Blood (BLOOD GLUCOSE TEST STRIPS) STRP 1 each by In Vitro route in the morning, at noon, and at bedtime. May substitute to any manufacturer covered by patient's insurance. 07/07/24 08/06/24  Rashid, Farhan, MD  insulin  aspart (NOVOLOG ) 100 UNIT/ML FlexPen Inject 5-16 Units  into the skin 3 (three) times daily with meals. If eating and Blood Glucose (BG) 80 or higher inject 5 units. If not eating, correction dose only. BG <150= 5 unit; BG 150-200= 6 unit; BG 201-250= 8 unit; BG 251-300= 10 unit; BG 301-350= 12 unit; BG 351-400= 14 unit; BG >400= 16 unit and Call Primary Care. 07/07/24   Rashid, Farhan, MD  insulin  glargine (LANTUS ) 100 UNIT/ML Solostar Pen Inject 16 Units into the skin daily. May substitute as needed per insurance. 07/07/24   Rashid, Farhan, MD  Insulin  Pen Needle (PEN NEEDLES) 31G X 5 MM MISC 1 each by Does not apply route 3 (three) times daily. May dispense any manufacturer covered by patient's insurance. 07/07/24   Rashid, Farhan, MD  Lancet Device MISC 1 each by Does not apply route in the morning, at noon, and at bedtime. May substitute to any manufacturer covered by patient's insurance. 07/07/24 08/06/24  Dino Antu, MD  Lancets Misc. MISC 1 each by Does not apply route in the morning, at noon, and at bedtime. May substitute to any manufacturer covered by patient's insurance. 07/07/24 08/06/24  Rashid, Farhan, MD  memantine  (NAMENDA ) 10 MG tablet Take 1 tablet (10 mg at night) for 2 weeks, then increase to 1 tablet (10 mg) twice a day Patient taking differently: Take 10 mg by mouth 2 (two) times daily. 04/22/24   Wertman, Sara E, PA-C  METFORMIN  HCL PO Take 750 mg by mouth daily.    [provider]  pregabalin  (LYRICA ) 100 MG capsule Take 100 mg by mouth every evening. 03/31/24   [provider]    Allergies: Metoprolol     Review of Systems  Constitutional:  Positive for fatigue and malaise/fatigue. Negative for chills and fever.  HENT:  Negative for congestion.   Respiratory:  Positive for cough. Negative for chest tightness, shortness of breath and wheezing.   Cardiovascular:  Positive for syncope. Negative for chest pain, palpitations and leg swelling.  Gastrointestinal:  Positive for abdominal pain, diarrhea and nausea. Negative  for constipation and vomiting.  Genitourinary:  Negative for dysuria, flank pain and frequency.       Foley present  Musculoskeletal:  Negative for back pain, neck pain and neck stiffness.  Skin:  Negative for rash and wound.  Neurological:  Positive for syncope and light-headedness. Negative for numbness and headaches.  Psychiatric/Behavioral:  Positive for confusion.   All other systems reviewed and are negative.   Updated Vital Signs Ht 5' 5 (1.651 m)   Wt 54 kg   BMI 19.81 kg/m   Physical Exam Vitals and nursing note reviewed.  Constitutional:      General: She is not in acute distress.    Appearance: She is well-developed. She is not ill-appearing or diaphoretic.  HENT:     Head: Normocephalic and atraumatic.     Nose: No congestion or rhinorrhea.     Mouth/Throat:     Mouth: Mucous membranes are dry.  Eyes:     Extraocular Movements: Extraocular movements intact.     Conjunctiva/sclera: Conjunctivae normal.     Pupils: Pupils are equal, round, and reactive to light.  Cardiovascular:     Rate and Rhythm: Normal rate and regular rhythm.     Heart sounds: No murmur heard. Pulmonary:     Effort: Pulmonary effort is normal. No respiratory distress.     Breath sounds: Rhonchi present. No wheezing or rales.  Chest:     Chest wall: No tenderness.  Abdominal:     Palpations: Abdomen is soft.     Tenderness: There is abdominal tenderness. There is no guarding or rebound.  Musculoskeletal:        General: Tenderness present. No swelling.     Cervical back: Neck supple.  Skin:    General: Skin is warm and dry.     Capillary Refill: Capillary refill takes less than 2 seconds.     Findings: No erythema or rash.  Neurological:     Mental Status: She is alert.     Sensory: No sensory deficit.     Motor: No weakness.  Psychiatric:        Mood and Affect: Mood normal.     (all labs ordered are listed, but only abnormal results are displayed) Labs Reviewed   COMPREHENSIVE METABOLIC PANEL WITH GFR - Abnormal; Notable for the following components:      Result Value   Glucose, Bld 133 (*)    Creatinine, Ser 1.01 (*)    Albumin 3.3 (*)    AST 47 (*)    ALT 68 (*)    GFR, Estimated 56 (*)    All other components within normal limits  CBC - Abnormal; Notable for the following components:   WBC 14.8 (*)    RBC 3.75 (*)    Hemoglobin 11.9 (*)    MCV 102.4 (*)    All other components within normal limits  URINALYSIS, ROUTINE W REFLEX MICROSCOPIC - Abnormal; Notable for the following components:  APPearance CLOUDY (*)    Hgb urine dipstick LARGE (*)    Protein, ur 30 (*)    Leukocytes,Ua MODERATE (*)    Bacteria, UA RARE (*)    Non Squamous Epithelial 0-5 (*)    All other components within normal limits  TSH - Abnormal; Notable for the following components:   TSH 6.675 (*)    All other components within normal limits  CBG MONITORING, ED - Abnormal; Notable for the following components:   Glucose-Capillary 117 (*)    All other components within normal limits  CULTURE, BLOOD (ROUTINE X 2)  CULTURE, BLOOD (ROUTINE X 2)  RESP PANEL BY RT-PCR (RSV, FLU A&B, COVID)  [id number]  URINE CULTURE  LIPASE, BLOOD  AMMONIA  FOLATE  C-REACTIVE PROTEIN  CK  T4, FREE  VITAMIN B12  CBG MONITORING, ED  I-STAT CG4 LACTIC ACID, ED  I-STAT CG4 LACTIC ACID, ED  TROPONIN I (HIGH SENSITIVITY)  TROPONIN I (HIGH SENSITIVITY)    EKG: EKG Interpretation Date/Time:  Thursday July 09 2024 10:36:16 EDT Ventricular Rate:  61 PR Interval:  179 QRS Duration:  80 QT Interval:  449 QTC Calculation: 453 R Axis:   28  Text Interpretation: Sinus rhythm Atrial premature complex Nonspecific T abnormalities, lateral leads when compared to prior, slower raet No STEMI Confirmed by Ginger Barefoot ([id number]) on 07/09/2024 11:30:51 AM  Radiology: CT ABDOMEN PELVIS W CONTRAST Result Date: 07/09/2024 CLINICAL DATA:  Recent mission for UTI and also some abnormal free air in  abdomen previously. Abdominal tenderness hypotension and syncope. EXAM: CT ABDOMEN AND PELVIS WITH CONTRAST TECHNIQUE: Multidetector CT imaging of the abdomen and pelvis was performed using the standard protocol following bolus administration of intravenous contrast. RADIATION DOSE REDUCTION: This exam was performed according to the departmental dose-optimization program which includes automated exposure control, adjustment of the mA and/or kV according to patient size and/or use of iterative reconstruction technique. CONTRAST:  75mL OMNIPAQUE  IOHEXOL  350 MG/ML SOLN COMPARISON:  CT of the abdomen and pelvis dated July 03, 2024. FINDINGS: Lower chest: Mild dependent atelectasis within the lower lobes bilaterally. The nondependent lungs are clear. Hepatobiliary: Small hypodensities are again seen scattered throughout the liver, compatible with cysts. The gallbladder is unremarkable. There is no biliary ductal dilatation. Pancreas: Normal. Spleen: Normal. Adrenals/Urinary Tract: There is mild left renal cortical atrophy and there are few renal cysts present. Trace left perinephric fluid. The ureters are normal in caliber. A Foley catheter is present within the collapsed urinary bladder. 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 There is a small volume of air present within the bladder lumen anteriorly. There are few bubbles of air within the surrounding perivesicular fat. The adrenal glands remain unremarkable. Stomach/Bowel: There are few scattered colonic diverticula present. There is no evidence of diverticulitis. The stomach, small bowel and appendix are unremarkable. Vascular/Lymphatic: Moderate calcific atheromatous disease within the abdominal aorta. The mesenteric and renal arteries are widely patent. The inferior vena cava is unremarkable. There is no lymphadenopathy. Reproductive: Status post hysterectomy. No adnexal masses. Other: None. Musculoskeletal: No osseous lesions.  IMPRESSION: 1. 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 2. Multiple left renal cysts with trace perinephric fluid. 3. Mild sigmoid diverticulosis. Electronically Signed   By: Evalene Coho M.D.   On: 07/09/2024 12:41   CT Head Wo Contrast Result Date: 07/09/2024 CLINICAL DATA:  Mental status change, persistent or worsening Hypotension and syncope this morning. Recent mission for UTI and abnormal abdominal CT. Distally.  EXAM: CT HEAD WITHOUT CONTRAST TECHNIQUE: Contiguous axial images were obtained from the base of the skull through the vertex without intravenous contrast. RADIATION DOSE REDUCTION: This exam was performed according to the departmental dose-optimization program which includes automated exposure control, adjustment of the mA and/or kV according to patient size and/or use of iterative reconstruction technique. COMPARISON:  CT the head dated March 15, 2024. FINDINGS: Brain: Age-related atrophy and mild periventricular white matter disease. No evidence of hemorrhage, mass, acute cortical infarct or hydrocephalus. Vascular: Mild vascular calcifications Skull: Intact and unremarkable. Sinuses/Orbits: The visualized paranasal sinuses and mastoid air cells are clear. Patient is status post bilateral lens replacement. Other: None. IMPRESSION: Age-related atrophy and mild periventricular white matter disease. No apparent acute process. Electronically Signed   By: Evalene Coho M.D.   On: 07/09/2024 12:30   DG Chest Portable 1 View Result Date: 07/09/2024 CLINICAL DATA:  Syncope, hypertension. EXAM: PORTABLE CHEST 1 VIEW COMPARISON:  March 15, 2024. FINDINGS: The heart size and mediastinal contours are within normal limits. Both lungs are clear. The visualized skeletal structures are unremarkable. IMPRESSION: No active disease. Electronically Signed   By: Lynwood Landy Raddle M.D.   On: 07/09/2024 11:29     Procedures   CRITICAL CARE Performed by: Lonni PARAS  Spiros Greenfeld Total critical care time: 35 minutes Critical care time was exclusive of separately billable procedures and treating other patients. Critical care was necessary to treat or prevent imminent or life-threatening deterioration. Critical care was time spent personally by me on the following activities: development of treatment plan with patient and/or surrogate as well as nursing, discussions with consultants, evaluation of patient's response to treatment, examination of patient, obtaining history from patient or surrogate, ordering and performing treatments and interventions, ordering and review of laboratory studies, ordering and review of radiographic studies, pulse oximetry and re-evaluation of patient's condition.   Medications Ordered in the ED  acetaminophen  (TYLENOL ) tablet 650 mg (650 mg Oral Given 07/09/24 1427)    Or  acetaminophen  (TYLENOL ) suppository 650 mg ( Rectal See Alternative 07/09/24 1427)  ondansetron  (ZOFRAN ) tablet 4 mg (4 mg Oral Given 07/09/24 1427)    Or  ondansetron  (ZOFRAN ) injection 4 mg ( Intravenous See Alternative 07/09/24 1427)  lactated ringers  infusion ( Intravenous New Bag/Given 07/09/24 1433)  vancomycin  (VANCOREADY) IVPB 1250 mg/250 mL (1,250 mg Intravenous New Bag/Given 07/09/24 1452)  ceFEPIme  (MAXIPIME ) 2 g in sodium chloride  0.9 % 100 mL IVPB (has no administration in time range)  vancomycin  (VANCOREADY) IVPB 750 mg/150 mL (has no administration in time range)  sodium chloride  0.9 % bolus 500 mL (0 mLs Intravenous Stopped 07/09/24 1249)  iohexol  (OMNIPAQUE ) 350 MG/ML injection 75 mL (75 mLs Intravenous Contrast Given 07/09/24 1219)  cefTRIAXone  (ROCEPHIN ) 1 g in sodium chloride  0.9 % 100 mL IVPB (0 g Intravenous Stopped 07/09/24 1400)  ceFEPIme  (MAXIPIME ) 2 g in sodium chloride  0.9 % 100 mL IVPB (0 g Intravenous Stopped 07/09/24 1445)                                    Medical Decision Making Amount and/or Complexity of Data Reviewed Labs:  ordered. Radiology: ordered.  Risk Prescription drug management. Decision regarding hospitalization.    Jacqueline Ball is a 83 y.o. female with a past medical history significant for Alzheimer's dementia, hypertension, hyperlipidemia, diabetes, anxiety, atrial fibrillation on Eliquis  therapy, and recent discharge from the hospital yesterday for urinary  tract infection and altered mental status who presents with hypotension and syncope and altered mental status.  According to EMS report from family, patient could not be rouses morning and blood pressure was found to be 80/40.  She was given some fluids and it has started to improve.  She reportedly upset stomach with some nausea and was having some abdominal discomfort.  During her recent mission she had CT that showed some free air that was felt to be from emphysematous urinary tract infection.  She still has a Foley catheter in place.  CBG was found to be 134 with EMS and she was brought in for evaluation.  She is currently denying any chest pain but does have a dry cough.  She has not any shortness of breath.  She does not member being in the hospital and does not know why she is here now.  On initial exam, abdomen is tender.  I did hear bowel sounds.  Lungs had some coarseness.  She has a slight murmur.  She has intact pulses and has intact strength and sensation in extremities.  Legs are not critically edematous.  Pupils are symmetric and reactive there is no evidence of head trauma.  Head and neck are nontender on my exam.  Given the patient's hypotension altered mental status and syncope anticipate she will likely need readmission.  Will get repeat CT Abdo pelvis given the abdominal tenderness on exam today and there is report of free air recently.  Will also get head CT given the altered mental status and syncope and will get a chest x-ray with her cough.  Will get workup with labs and workup for persistent infection.  Anticipate  readmission given the hypotension and altered mental status and syncope today.  1:10 PM Workup continues to return.  Patient does have new leukocytosis compared to last time.  Metabolic panel shows kidney function is normal and mild elevation in LFT.  Urinalysis does show hemoglobin, leukocytes, and bacteria.  Given the abdominal pain, chills, and syncope and hypotension, will treat for persistent UTI.  Troponin normal, will trend.  TSH slightly elevated but lactic acid is normal.  CT abdomen pelvis showed near resolution of the abnormal free air.  No other surgical problem seen.  CT head did not show acute intracranial abnormality chest x-ray did not show pneumonia.  Given the hypotension and altered mental status and what appears to be persistent infection of the urine, will admit for IV antibiotics.      Final diagnoses:  Hypotension, unspecified hypotension type  Syncope, unspecified syncope type   Clinical Impression: 1. Hypotension, unspecified hypotension type   2. Syncope, unspecified syncope type     Disposition: Admit  This note was prepared with assistance of Dragon voice recognition software. Occasional wrong-word or sound-a-like substitutions may have occurred due to the inherent limitations of voice recognition software.     Danikah Budzik, Lonni PARAS, MD 07/09/24 1556

## 2024-07-10 ENCOUNTER — Inpatient Hospital Stay (HOSPITAL_COMMUNITY)

## 2024-07-10 DIAGNOSIS — G4486 Cervicogenic headache: Secondary | ICD-10-CM | POA: Diagnosis not present

## 2024-07-10 DIAGNOSIS — R55 Syncope and collapse: Secondary | ICD-10-CM

## 2024-07-10 DIAGNOSIS — M62838 Other muscle spasm: Secondary | ICD-10-CM | POA: Diagnosis not present

## 2024-07-10 LAB — COMPREHENSIVE METABOLIC PANEL WITH GFR
ALT: 50 U/L — ABNORMAL HIGH (ref 0–44)
AST: 30 U/L (ref 15–41)
Albumin: 2.9 g/dL — ABNORMAL LOW (ref 3.5–5.0)
Alkaline Phosphatase: 83 U/L (ref 38–126)
Anion gap: 12 (ref 5–15)
BUN: 17 mg/dL (ref 8–23)
CO2: 23 mmol/L (ref 22–32)
Calcium: 9.3 mg/dL (ref 8.9–10.3)
Chloride: 100 mmol/L (ref 98–111)
Creatinine, Ser: 0.97 mg/dL (ref 0.44–1.00)
GFR, Estimated: 58 mL/min — ABNORMAL LOW (ref >60)
Glucose, Bld: 269 mg/dL — ABNORMAL HIGH (ref 70–99)
Potassium: 4.5 mmol/L (ref 3.5–5.1)
Sodium: 135 mmol/L (ref 135–145)
Total Bilirubin: 0.5 mg/dL (ref 0.0–1.2)
Total Protein: 6 g/dL — ABNORMAL LOW (ref 6.5–8.1)

## 2024-07-10 LAB — C-REACTIVE PROTEIN: CRP: 4.4 mg/dL — ABNORMAL HIGH (ref <1.0)

## 2024-07-10 LAB — CBC
HCT: 31.8 % — ABNORMAL LOW (ref 36.0–46.0)
Hemoglobin: 10.5 g/dL — ABNORMAL LOW (ref 12.0–15.0)
MCH: 31.7 pg (ref 26.0–34.0)
MCHC: 33 g/dL (ref 30.0–36.0)
MCV: 96.1 fL (ref 80.0–100.0)
Platelets: 269 K/uL (ref 150–400)
RBC: 3.31 MIL/uL — ABNORMAL LOW (ref 3.87–5.11)
RDW: 13.2 % (ref 11.5–15.5)
WBC: 10.2 K/uL (ref 4.0–10.5)
nRBC: 0 % (ref 0.0–0.2)

## 2024-07-10 LAB — CK: Total CK: 46 U/L (ref 38–234)

## 2024-07-10 LAB — ECHOCARDIOGRAM COMPLETE
Area-P 1/2: 3.85 cm2
S' Lateral: 2.5 cm

## 2024-07-10 LAB — GLUCOSE, CAPILLARY
Glucose-Capillary: 226 mg/dL — ABNORMAL HIGH (ref 70–99)
Glucose-Capillary: 365 mg/dL — ABNORMAL HIGH (ref 70–99)
Glucose-Capillary: >600 mg/dL (ref 70–99)
Glucose-Capillary: 302 mg/dL — ABNORMAL HIGH (ref 70–99)

## 2024-07-10 LAB — T4, FREE: Free T4: 0.7 ng/dL (ref 0.61–1.12)

## 2024-07-10 LAB — VITAMIN B12: Vitamin B-12: 522 pg/mL (ref 180–914)

## 2024-07-10 MED ORDER — GADOBUTROL 1 MMOL/ML IV SOLN
5.5000 mL | Freq: Once | INTRAVENOUS | Status: AC | PRN
  Administered 2024-07-10: 5.5 mL via INTRAVENOUS

## 2024-07-10 MED ORDER — INSULIN ASPART 100 UNIT/ML IJ SOLN
0.0000 [IU] | Freq: Three times a day (TID) | INTRAMUSCULAR | Status: DC
  Administered 2024-07-10: 9 [IU] via SUBCUTANEOUS
  Administered 2024-07-11: 5 [IU] via SUBCUTANEOUS
  Administered 2024-07-11: 2 [IU] via SUBCUTANEOUS
  Administered 2024-07-11: 5 [IU] via SUBCUTANEOUS
  Administered 2024-07-12: 7 [IU] via SUBCUTANEOUS
  Administered 2024-07-12: 2 [IU] via SUBCUTANEOUS
  Administered 2024-07-12: 3 [IU] via SUBCUTANEOUS
  Administered 2024-07-13: 2 [IU] via SUBCUTANEOUS
  Administered 2024-07-13 – 2024-07-14 (×3): 5 [IU] via SUBCUTANEOUS
  Administered 2024-07-14: 2 [IU] via SUBCUTANEOUS
  Administered 2024-07-14: 5 [IU] via SUBCUTANEOUS
  Administered 2024-07-15: 1 [IU] via SUBCUTANEOUS
  Administered 2024-07-15: 5 [IU] via SUBCUTANEOUS

## 2024-07-10 MED ORDER — IOHEXOL 350 MG/ML SOLN
75.0000 mL | Freq: Once | INTRAVENOUS | Status: AC | PRN
  Administered 2024-07-10: 75 mL via INTRAVENOUS

## 2024-07-10 MED ORDER — CHLORHEXIDINE GLUCONATE CLOTH 2 % EX PADS
6.0000 | MEDICATED_PAD | Freq: Every day | CUTANEOUS | Status: DC
  Administered 2024-07-10 – 2024-07-15 (×6): 6 via TOPICAL

## 2024-07-10 MED ORDER — INSULIN ASPART 100 UNIT/ML IJ SOLN
0.0000 [IU] | Freq: Every day | INTRAMUSCULAR | Status: DC
  Administered 2024-07-10: 4 [IU] via SUBCUTANEOUS
  Administered 2024-07-12 – 2024-07-14 (×2): 2 [IU] via SUBCUTANEOUS

## 2024-07-10 MED ORDER — SODIUM CHLORIDE 0.9 % IV SOLN: INTRAVENOUS | Status: DC

## 2024-07-10 NOTE — Progress Notes (Signed)
 PROGRESS NOTE    Jacqueline Ball  FMW:[id number] DOB: [birth year] DOA: 07/09/2024 PCP: Haze Kingfisher, MD  Outpatient Specialists:     Brief Narrative:  Patient is an 83 year old female past medical history significant for paroxysmal atrial fibrillation on Eliquis , hyperlipidemia, hypertension, type 2 diabetes mellitus with last A1c of 11.5%, dementia and anxiety.  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  On presentation, blood pressure was 80/40 mmHg, associated nausea vomiting and syncope.  Blood pressure has improved.  Leukocytosis has resolved.  Patient is volume depleted.  Assessment & Plan:   Principal Problem:   Syncope and collapse   Syncope and collapse: -Likely secondary to volume depletion, hypotension and likely UTI. - Continue to monitor blood pressure closely. - Negative CT. - No further syncope reported. - Hypotension has resolved.  UTI/sepsis/acute emphysematous cholecystitis secondary to Klebsiella: -Patient is on cefepime  and vancomycin . - Follow cultures.  Hypotension: - Resolved.  Volume depletion: - IV normal saline. - Continue to monitor closely. - Echo result noted.  Type 2 diabetes mellitus: - Last A1c of 11.5%. - Uncontrolled. - Continue to optimize.  Alzheimer's dementia: - No behavioral problems.  Persistent atrial fibrillation: -Eliquis  is currently on hold.  Anemia: - Hemoglobin of 10.5 g/dL. - MCV of 96.1. - Likely multifactorial. - Check iron and B12 level.   DVT prophylaxis: SCD. Code Status: Full code. Family Communication:  Disposition Plan: Inpatient.   Consultants:  Neurology.  Procedures:  None.  Antimicrobials:  IV vancomycin . IV cefepime .   Subjective: No significant history from patient.  Objective: Vitals:   07/10/24 0500 07/10/24 0729 07/10/24 1107 07/10/24 1504  BP:  (!)  154/76 (!) 140/88 (!) 141/75  Pulse:  75 82 81  Resp:  20 20 20   Temp:  98.9 F (37.2 C) 98.9 F (37.2 C) 98 F (36.7 C)  TempSrc:  Oral Oral Axillary  SpO2:   100% 98%  Weight: 54.1 kg     Height:        Intake/Output Summary (Last 24 hours) at 07/10/2024 1604 Last data filed at 07/10/2024 1010 Gross per 24 hour  Intake 370 ml  Output 3200 ml  Net -2830 ml   Filed Weights   07/09/24 1020 07/10/24 0500  Weight: 54 kg 54.1 kg    Examination:  General exam: Appears calm and comfortable.  Dry buccal mucosa.  Patient is pale. Respiratory system: Clear to auscultation.  Cardiovascular system: S1 & S2  Gastrointestinal system: Abdomen is soft and nontender. Central nervous system: Awake and alert.  Extremities: No leg edema.  Data Reviewed: I have personally reviewed following labs and imaging studies  CBC: Recent Labs  Lab 07/03/24 1619 07/04/24 0209 07/05/24 0256 07/06/24 0246 07/09/24 1049 07/10/24 0308  WBC 11.7* 10.1 8.1 7.6 14.8* 10.2  NEUTROABS 9.8*  --   --  4.8  --   --   HGB 12.5 11.9* 10.1* 10.2* 11.9* 10.5*  HCT 37.8 34.4* 30.2* 30.3* 38.4 31.8*  MCV 96.2 91.2 93.2 93.5 102.4* 96.1  PLT 273 242 213 205 271 269   Basic Metabolic Panel: Recent Labs  Lab 07/04/24 0209 07/05/24 0256 07/06/24 0246 07/09/24 1049 07/10/24 0308  NA 138 135 137 139 135  K 3.0* 3.5 4.0 3.6 4.5  CL 107 105 105 103 100  CO2 23 22 23 22 23   GLUCOSE 167* 260* 368* 133* 269*  BUN 15 19 12 17  17  CREATININE 0.74 0.90 0.84 1.01* 0.97  CALCIUM  9.2 8.6* 8.6* 9.6 9.3   GFR: Estimated Creatinine Clearance: 38.2 mL/min (by C-G formula based on SCr of 0.97 mg/dL). Liver Function Tests: Recent Labs  Lab 07/03/24 1619 07/09/24 1049 07/10/24 0308  AST 29 47* 30  ALT 32 68* 50*  ALKPHOS 129* 100 83  BILITOT 0.8 0.6 0.5  PROT 7.0 6.7 6.0*  ALBUMIN 3.8 3.3* 2.9*   Recent Labs  Lab 07/03/24 1619 07/09/24 1049  LIPASE 60* 42   Recent Labs  Lab 07/09/24 1050  AMMONIA  16   Coagulation Profile: Recent Labs  Lab 07/09/24 2322  INR 1.1   Cardiac Enzymes: Recent Labs  Lab 07/09/24 2322  CKTOTAL 46   BNP (last 3 results) No results for input(s): PROBNP in the last 8760 hours. HbA1C: No results for input(s): HGBA1C in the last 72 hours. CBG: Recent Labs  Lab 07/07/24 0806 07/07/24 1102 07/09/24 1024 07/09/24 2131 07/10/24 0709  GLUCAP 348* 162* 117* 164* 226*   Lipid Profile: No results for input(s): CHOL, HDL, LDLCALC, TRIG, CHOLHDL, LDLDIRECT in the last 72 hours. Thyroid  Function Tests: Recent Labs    07/09/24 1050 07/09/24 2322  TSH 6.675*  --   FREET4  --  0.70   Anemia Panel: Recent Labs    07/09/24 1251 07/09/24 2322  VITAMINB12  --  522  FOLATE 11.4  --    Urine analysis:    Component Value Date/Time   COLORURINE YELLOW 07/09/2024 1045   APPEARANCEUR CLOUDY (A) 07/09/2024 1045   LABSPEC 1.016 07/09/2024 1045   PHURINE 5.0 07/09/2024 1045   GLUCOSEU NEGATIVE 07/09/2024 1045   HGBUR LARGE (A) 07/09/2024 1045   BILIRUBINUR NEGATIVE 07/09/2024 1045   BILIRUBINUR negative 07/16/2023 1939   KETONESUR NEGATIVE 07/09/2024 1045   PROTEINUR 30 (A) 07/09/2024 1045   UROBILINOGEN 0.2 07/16/2023 1939   UROBILINOGEN 0.2 04/02/2020 1041   NITRITE NEGATIVE 07/09/2024 1045   LEUKOCYTESUR MODERATE (A) 07/09/2024 1045   Sepsis Labs: @LABRCNTIP (procalcitonin:4,lacticidven:4)  ) Recent Results (from the past 240 hours)  Urine Culture     Status: Abnormal   Collection Time: 07/03/24  4:05 PM   Specimen: Urine, Random  Result Value Ref Range Status   Specimen Description URINE, RANDOM  Final   Special Requests   Final    NONE Reflexed from [id number] Performed at [facility], [address], [place], [place] [address]    Culture >=100,000 COLONIES/mL KLEBSIELLA PNEUMONIAE (A)  Final   Report Status 07/07/2024 FINAL  Final   Organism ID, Bacteria KLEBSIELLA PNEUMONIAE (A)  Final      Susceptibility    Klebsiella pneumoniae - MIC*    AMPICILLIN  RESISTANT Resistant     CEFAZOLIN <=4 SENSITIVE Sensitive     CEFEPIME  <=0.12 SENSITIVE Sensitive     CEFTRIAXONE  <=0.25 SENSITIVE Sensitive     CIPROFLOXACIN <=0.25 SENSITIVE Sensitive     GENTAMICIN <=1 SENSITIVE Sensitive     IMIPENEM <=0.25 SENSITIVE Sensitive     NITROFURANTOIN <=16 SENSITIVE Sensitive     TRIMETH /SULFA  <=20 SENSITIVE Sensitive     AMPICILLIN /SULBACTAM <=2 SENSITIVE Sensitive     PIP/TAZO <=4 SENSITIVE Sensitive ug/mL    * >=100,000 COLONIES/mL KLEBSIELLA PNEUMONIAE  Blood culture (routine x 2)     Status: None   Collection Time: 07/03/24  5:50 PM   Specimen: BLOOD  Result Value Ref Range Status   Specimen Description BLOOD SITE NOT SPECIFIED  Final   Special Requests  Final    BOTTLES DRAWN AEROBIC AND ANAEROBIC Blood Culture results may not be optimal due to an inadequate volume of blood received in culture bottles   Culture   Final    NO GROWTH 5 DAYS Performed at [facility], [address], [place], [place] [address]    Report Status 07/08/2024 FINAL  Final  MRSA Next Gen by PCR, Nasal     Status: None   Collection Time: 07/03/24 11:31 PM   Specimen: Nasal Mucosa; Nasal Swab  Result Value Ref Range Status   MRSA by PCR Next Gen NOT DETECTED NOT DETECTED Final    Comment: (NOTE) The GeneXpert MRSA Assay (FDA approved for NASAL specimens only), is one component of a comprehensive MRSA colonization surveillance program. It is not intended to diagnose MRSA infection nor to guide or monitor treatment for MRSA infections. Test performance is not FDA approved in patients less than 67 years old. Performed at [facility], [address] [place], [place] [address]   Blood culture (routine x 2)     Status: None   Collection Time: 07/03/24 11:49 PM   Specimen: BLOOD LEFT ARM  Result Value Ref Range Status   Specimen Description BLOOD LEFT ARM  Final   Special Requests   Final    BOTTLES DRAWN  AEROBIC AND ANAEROBIC Blood Culture adequate volume   Culture   Final    NO GROWTH 5 DAYS Performed at [facility], [address], [place], [place] [address]    Report Status 07/09/2024 FINAL  Final  Blood culture (routine x 2)     Status: None (Preliminary result)   Collection Time: 07/09/24 10:40 AM   Specimen: BLOOD LEFT HAND  Result Value Ref Range Status   Specimen Description BLOOD LEFT HAND  Final   Special Requests   Final    BOTTLES DRAWN AEROBIC ONLY Blood Culture results may not be optimal due to an inadequate volume of blood received in culture bottles   Culture   Final    NO GROWTH < 24 HOURS Performed at [facility], [address], [place], [place] [address]    Report Status PENDING  Incomplete  Resp panel by RT-PCR (RSV, Flu A&B, Covid) Anterior Nasal Swab     Status: None   Collection Time: 07/09/24 10:42 AM   Specimen: Anterior Nasal Swab  Result Value Ref Range Status   SARS Coronavirus 2 by RT PCR NEGATIVE NEGATIVE Final   Influenza A by PCR NEGATIVE NEGATIVE Final   Influenza B by PCR NEGATIVE NEGATIVE Final    Comment: (NOTE) The Xpert Xpress SARS-CoV-2/FLU/RSV plus assay is intended as an aid in the diagnosis of influenza from Nasopharyngeal swab specimens and should not be used as a sole basis for treatment. Nasal washings and aspirates are unacceptable for Xpert Xpress SARS-CoV-2/FLU/RSV testing.  Fact Sheet for Patients: BloggerCourse.com  Fact Sheet for Healthcare Providers: SeriousBroker.it  This test is not yet approved or cleared by the [place]  FDA and has been authorized for detection and/or diagnosis of SARS-CoV-2 by FDA under an Emergency Use Authorization (EUA). This EUA will remain in effect (meaning this test can be used) for the duration of the COVID-19 declaration under Section 564(b)(1) of the Act, 21 U.S.C. section 360bbb-3(b)(1), unless the authorization is  terminated or revoked.     Resp Syncytial Virus by PCR NEGATIVE NEGATIVE Final    Comment: (NOTE) Fact Sheet for Patients: BloggerCourse.com  Fact Sheet  for Healthcare Providers: SeriousBroker.it  This test is not yet approved or cleared by the [place]  FDA and has been authorized for detection and/or diagnosis of SARS-CoV-2 by FDA under an Emergency Use Authorization (EUA). This EUA will remain in effect (meaning this test can be used) for the duration of the COVID-19 declaration under Section 564(b)(1) of the Act, 21 U.S.C. section 360bbb-3(b)(1), unless the authorization is terminated or revoked.  Performed at [facility], [address] [place], [place] [address]   Blood culture (routine x 2)     Status: None (Preliminary result)   Collection Time: 07/09/24 10:45 AM   Specimen: BLOOD LEFT HAND  Result Value Ref Range Status   Specimen Description BLOOD LEFT HAND  Final   Special Requests   Final    BOTTLES DRAWN AEROBIC ONLY Blood Culture results may not be optimal due to an inadequate volume of blood received in culture bottles   Culture   Final    NO GROWTH < 24 HOURS Performed at [facility], [address], [place], [place] [address]    Report Status PENDING  Incomplete         Radiology Studies: MR BRAIN W WO CONTRAST Result Date: 07/10/2024 CLINICAL DATA:  Possible meningitis or subtle SAH EXAM: MRI HEAD WITHOUT AND WITH CONTRAST TECHNIQUE: Multiplanar, multiecho pulse sequences of the brain and surrounding structures were obtained without and with intravenous contrast. CONTRAST:  5.96mL GADAVIST GADOBUTROL 1 MMOL/ML IV SOLN COMPARISON:  CT the head dated July 09, 2024. FINDINGS: Brain: There is no restricted diffusion to indicate acute or recent infarction. There is age-related atrophy and mild-to-moderate periventricular white matter disease. There is no evidence of hemorrhage, mass, cortical  infarct or hydrocephalus. There is no abnormal parenchymal or meningeal disease. Vascular: Normal flow voids. Skull and upper cervical spine: Normal marrow signal. No osseous lesions. Sinuses/Orbits: Clear paranasal sinuses. Status post bilateral lens replacement. Other: None. IMPRESSION: 1. Age-related atrophy and mild to moderate cerebral white matter disease. No apparent acute process. Electronically Signed   By: Evalene Coho M.D.   On: 07/10/2024 15:19   ECHOCARDIOGRAM COMPLETE Result Date: 07/10/2024    ECHOCARDIOGRAM REPORT   Patient Name:   Prezley Qadir Date of Exam: 07/10/2024 Medical Rec #:  [id number]            Height:       65.0 in Accession #:    [id number]           Weight:       119.3 lb Date of Birth:  11/21/1941            BSA:          1.588 m Patient Age:    83 years             BP:           154/76 mmHg Patient Gender: F                    HR:           86 bpm. Exam Location:  Inpatient Procedure: 2D Echo, Color Doppler and Cardiac Doppler (Both Spectral and Color            Flow Doppler were utilized during procedure). Indications:    Syncope R55  History:        Patient has no prior history of Echocardiogram examinations.  Sonographer:    Tinnie Gosling RDCS Referring Phys:  EKTA V PATEL IMPRESSIONS  1. Left ventricular ejection fraction, by estimation, is 60 to 65%. The left ventricle has normal function. The left ventricle has no regional wall motion abnormalities. There is mild asymmetric left ventricular hypertrophy of the basal-septal segment. Left ventricular diastolic parameters were normal.  2. Right ventricular systolic function is normal. The right ventricular size is normal.  3. The mitral valve is normal in structure. No evidence of mitral valve regurgitation. No evidence of mitral stenosis.  4. The aortic valve is tricuspid. Aortic valve regurgitation is not visualized. No aortic stenosis is present.  5. The inferior vena cava is normal in size with greater than 50%  respiratory variability, suggesting right atrial pressure of 3 mmHg. FINDINGS  Left Ventricle: Left ventricular ejection fraction, by estimation, is 60 to 65%. The left ventricle has normal function. The left ventricle has no regional wall motion abnormalities. The left ventricular internal cavity size was normal in size. There is  mild asymmetric left ventricular hypertrophy of the basal-septal segment. Left ventricular diastolic parameters were normal. Right Ventricle: The right ventricular size is normal. No increase in right ventricular wall thickness. Right ventricular systolic function is normal. Left Atrium: Left atrial size was normal in size. Right Atrium: Right atrial size was normal in size. Pericardium: There is no evidence of pericardial effusion. Mitral Valve: The mitral valve is normal in structure. No evidence of mitral valve regurgitation. No evidence of mitral valve stenosis. Tricuspid Valve: The tricuspid valve is normal in structure. Tricuspid valve regurgitation is not demonstrated. No evidence of tricuspid stenosis. Aortic Valve: The aortic valve is tricuspid. Aortic valve regurgitation is not visualized. No aortic stenosis is present. Pulmonic Valve: The pulmonic valve was normal in structure. Pulmonic valve regurgitation is not visualized. No evidence of pulmonic stenosis. Aorta: The aortic root is normal in size and structure. Venous: The inferior vena cava is normal in size with greater than 50% respiratory variability, suggesting right atrial pressure of 3 mmHg. IAS/Shunts: No atrial level shunt detected by color flow Doppler.  LEFT VENTRICLE PLAX 2D LVIDd:         3.50 cm   Diastology LVIDs:         2.50 cm   LV e' medial:    10.20 cm/s LV PW:         0.90 cm   LV E/e' medial:  7.5 LV IVS:        1.00 cm   LV e' lateral:   8.92 cm/s LVOT diam:     1.70 cm   LV E/e' lateral: 8.6 LV SV:         49 LV SV Index:   31 LVOT Area:     2.27 cm  RIGHT VENTRICLE             IVC RV S prime:      13.20 cm/s  IVC diam: 1.40 cm TAPSE (M-mode): 1.6 cm LEFT ATRIUM           Index LA diam:      3.00 cm 1.89 cm/m LA Vol (A4C): 36.8 ml 23.17 ml/m  AORTIC VALVE LVOT Vmax:   116.00 cm/s LVOT Vmean:  74.700 cm/s LVOT VTI:    0.216 m  AORTA Ao Root diam: 2.90 cm Ao Asc diam:  3.20 cm MITRAL VALVE MV Area (PHT): 3.85 cm     SHUNTS MV Decel Time: 197 msec     Systemic VTI:  0.22 m MV E velocity: 76.50 cm/s  Systemic Diam: 1.70 cm MV A velocity: 108.00 cm/s MV E/A ratio:  0.71 Morene Brownie Electronically signed by Morene Brownie Signature Date/Time: 07/10/2024/12:10:59 PM    Final    CT ANGIO HEAD NECK W WO CM Result Date: 07/10/2024 CLINICAL DATA:  Headache, new onset.  Syncope/presyncope. EXAM: CT ANGIOGRAPHY HEAD AND NECK WITH AND WITHOUT CONTRAST TECHNIQUE: Multidetector CT imaging of the head and neck was performed using the standard protocol during bolus administration of intravenous contrast. Multiplanar CT image reconstructions and MIPs were obtained to evaluate the vascular anatomy. Carotid stenosis measurements (when applicable) are obtained utilizing NASCET criteria, using the distal internal carotid diameter as the denominator. RADIATION DOSE REDUCTION: This exam was performed according to the departmental dose-optimization program which includes automated exposure control, adjustment of the mA and/or kV according to patient size and/or use of iterative reconstruction technique. CONTRAST:  75mL OMNIPAQUE  IOHEXOL  350 MG/ML SOLN COMPARISON:  CT head 07/09/2024. FINDINGS: CT HEAD FINDINGS Brain: No acute intracranial hemorrhage. No CT evidence of acute infarct. Nonspecific hypoattenuation in the periventricular and subcortical white matter favored to reflect chronic microvascular ischemic changes. Mild parenchymal volume loss. No edema, mass effect, or midline shift. The basilar cisterns are patent. Ventricles: The ventricles are normal. Vascular: Atherosclerotic calcifications of the carotid siphons.  No hyperdense vessel. Skull: No acute or aggressive finding. Orbits: Orbits are symmetric. Sinuses: The visualized paranasal sinuses are clear. Other: Mastoid air cells are clear. CTA NECK FINDINGS Aortic arch: Common origin of the brachiocephalic and left common carotid arteries. Imaged portion shows no evidence of aneurysm or dissection. No significant stenosis of the major arch vessel origins. Pulmonary arteries: As permitted by contrast timing, there are no filling defects in the visualized pulmonary arteries. Subclavian arteries: The subclavian arteries are patent bilaterally. Right carotid system: No evidence of dissection, stenosis (50% or greater), or occlusion. Minimal atherosclerosis at the carotid bifurcation. Left carotid system: No evidence of dissection, stenosis (50% or greater), or occlusion. Vertebral arteries: Codominant. No evidence of dissection, stenosis (50% or greater), or occlusion. Tortuosity of the bilateral V1 and V2 segments. Skeleton: No acute findings. Degenerative changes in the cervical spine. Intervertebral disc space narrowing and degenerative endplate osteophytes most pronounced at C3-4. Dental caries. Chronic appearing deformity of the C7 and T1 spinous processes. Other neck: The visualized airway is patent. No cervical lymphadenopathy. Upper chest: Visualized lung apices are clear. Review of the MIP images confirms the above findings CTA HEAD FINDINGS ANTERIOR CIRCULATION: The intracranial ICAs are patent bilaterally. Mild scattered atherosclerosis in the carotid siphons. No significant stenosis, proximal occlusion, aneurysm, or vascular malformation. MCAs: The middle cerebral arteries are patent bilaterally. ACAs: The anterior cerebral arteries are patent bilaterally. POSTERIOR CIRCULATION: No significant stenosis, proximal occlusion, aneurysm, or vascular malformation. PCAs: Patent bilaterally. Irregularity of the right P2 segment with focal moderate stenosis. There is  additional mild irregularity and mild narrowing of the P2 segment of the left PCA. Pcomm: The posterior communicating arteries are visualized bilaterally. SCAs: The superior cerebellar arteries are patent bilaterally. Basilar artery: Patent AICAs: Not well visualized. PICAs: Patent Vertebral arteries: The intracranial vertebral arteries are patent. Venous sinuses: As permitted by contrast timing, patent. Anatomic variants: None Review of the MIP images confirms the above findings IMPRESSION: No large vessel occlusion. Irregularity and focal moderate stenosis of the P2 segment right PCA. Additional mild stenosis of the P2 segment of the left PCA. Scattered atherosclerosis as above. No high-grade stenosis of the arteries in the neck. No CT evidence of acute intracranial  abnormality. Chronic microvascular ischemic changes and mild parenchymal volume loss. Electronically Signed   By: Donnice Mania M.D.   On: 07/10/2024 11:11   CT CERVICAL SPINE WO CONTRAST Result Date: 07/09/2024 CLINICAL DATA:  neck pain not sure if we can see c spine on her ct head or if we can do it as no charge. thanks EXAM: CT CERVICAL SPINE WITHOUT CONTRAST TECHNIQUE: Multidetector CT imaging of the cervical spine was performed without intravenous contrast. Multiplanar CT image reconstructions were also generated. RADIATION DOSE REDUCTION: This exam was performed according to the departmental dose-optimization program which includes automated exposure control, adjustment of the mA and/or kV according to patient size and/or use of iterative reconstruction technique. COMPARISON:  CT C-spine 12/28/2022. FINDINGS: Alignment: Reversal of the normal cervical lordosis centered at the C3-C5 levels likely due to positioning and degenerative changes. Grade 1 anterolisthesis of C2 on C3. Mild retrolisthesis of C5 on C6 and C6 on C7. Skull base and vertebrae: Slight motion artifact. Multilevel moderate degenerative changes spine. No associated severe  osseous neural foraminal or central canal stenosis. No acute fracture. No aggressive appearing focal osseous lesion or focal pathologic process. Soft tissues and spinal canal: No prevertebral fluid or swelling. No visible canal hematoma. Upper chest: Unremarkable. Other: None. IMPRESSION: No acute displaced fracture or traumatic listhesis of the cervical spine. Slightly limited by motion artifact. Electronically Signed   By: Morgane  Naveau M.D.   On: 07/09/2024 18:04   CT ABDOMEN PELVIS W CONTRAST Result Date: 07/09/2024 CLINICAL DATA:  Recent mission for UTI and also some abnormal free air in abdomen previously. Abdominal tenderness hypotension and syncope. EXAM: CT ABDOMEN AND PELVIS WITH CONTRAST TECHNIQUE: Multidetector CT imaging of the abdomen and pelvis was performed using the standard protocol following bolus administration of intravenous contrast. RADIATION DOSE REDUCTION: This exam was performed according to the departmental dose-optimization program which includes automated exposure control, adjustment of the mA and/or kV according to patient size and/or use of iterative reconstruction technique. CONTRAST:  75mL OMNIPAQUE  IOHEXOL  350 MG/ML SOLN COMPARISON:  CT of the abdomen and pelvis dated July 03, 2024. FINDINGS: Lower chest: Mild dependent atelectasis within the lower lobes bilaterally. The nondependent lungs are clear. Hepatobiliary: Small hypodensities are again seen scattered throughout the liver, compatible with cysts. The gallbladder is unremarkable. There is no biliary ductal dilatation. Pancreas: Normal. Spleen: Normal. Adrenals/Urinary Tract: There is mild left renal cortical atrophy and there are few renal cysts present. Trace left perinephric fluid. The ureters are normal in caliber. A Foley catheter is present within the collapsed urinary bladder. 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 There is a small volume of air present within the bladder lumen  anteriorly. There are few bubbles of air within the surrounding perivesicular fat. The adrenal glands remain unremarkable. Stomach/Bowel: There are few scattered colonic diverticula present. There is no evidence of diverticulitis. The stomach, small bowel and appendix are unremarkable. Vascular/Lymphatic: Moderate calcific atheromatous disease within the abdominal aorta. The mesenteric and renal arteries are widely patent. The inferior vena cava is unremarkable. There is no lymphadenopathy. Reproductive: Status post hysterectomy. No adnexal masses. Other: None. Musculoskeletal: No osseous lesions. IMPRESSION: 1. 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 2. Multiple left renal cysts with trace perinephric fluid. 3. Mild sigmoid diverticulosis. Electronically Signed   By: Evalene Coho M.D.   On: 07/09/2024 12:41   CT Head Wo Contrast Result Date: 07/09/2024 CLINICAL DATA:  Mental status change, persistent or worsening  Hypotension and syncope this morning. Recent mission for UTI and abnormal abdominal CT. Distally. EXAM: CT HEAD WITHOUT CONTRAST TECHNIQUE: Contiguous axial images were obtained from the base of the skull through the vertex without intravenous contrast. RADIATION DOSE REDUCTION: This exam was performed according to the departmental dose-optimization program which includes automated exposure control, adjustment of the mA and/or kV according to patient size and/or use of iterative reconstruction technique. COMPARISON:  CT the head dated March 15, 2024. FINDINGS: Brain: Age-related atrophy and mild periventricular white matter disease. No evidence of hemorrhage, mass, acute cortical infarct or hydrocephalus. Vascular: Mild vascular calcifications Skull: Intact and unremarkable. Sinuses/Orbits: The visualized paranasal sinuses and mastoid air cells are clear. Patient is status post bilateral lens replacement. Other: None. IMPRESSION: Age-related atrophy and mild  periventricular white matter disease. No apparent acute process. Electronically Signed   By: Evalene Coho M.D.   On: 07/09/2024 12:30   DG Chest Portable 1 View Result Date: 07/09/2024 CLINICAL DATA:  Syncope, hypertension. EXAM: PORTABLE CHEST 1 VIEW COMPARISON:  March 15, 2024. FINDINGS: The heart size and mediastinal contours are within normal limits. Both lungs are clear. The visualized skeletal structures are unremarkable. IMPRESSION: No active disease. Electronically Signed   By: Lynwood Landy Raddle M.D.   On: 07/09/2024 11:29        Scheduled Meds:  Chlorhexidine  Gluconate Cloth  6 each Topical Daily   insulin  aspart  0-5 Units Subcutaneous QHS   insulin  aspart  0-9 Units Subcutaneous TID WC   Continuous Infusions:  ceFEPime  (MAXIPIME ) IV 2 g (07/10/24 0626)   vancomycin  750 mg (07/10/24 0934)     LOS: 1 day    Time spent: 55 minutes    Leatrice Chapel, MD  [facility] Pager #: [phone] 7PM-7AM contact night coverage as above

## 2024-07-10 NOTE — Progress Notes (Signed)
  Echocardiogram 2D Echocardiogram has been performed.  Tinnie FORBES Gosling RDCS 07/10/2024, 11:05 AM

## 2024-07-10 NOTE — Plan of Care (Signed)

## 2024-07-10 NOTE — TOC Initial Note (Signed)
 Transition of Care [facility]) - Initial/Assessment Note    Patient Details  Name: Jacqueline Ball MRN: [id number] Date of Birth: [birth year]  Transition of Care [facility]) CM/SW Contact:    Andrez JULIANNA George, RN Phone Number: 07/10/2024, 3:22 PM  Clinical Narrative:                  Pt lives at home with her son. She states son is with her all the time.  She denies DME at home.  Son manages her medications and provides needed transportation. IP Care Management following.  Expected Discharge Plan: Home/Self Care Barriers to Discharge: Continued Medical Work up   Patient Goals and CMS Choice            Expected Discharge Plan and Services   Discharge Planning Services: CM Consult   Living arrangements for the past 2 months: Single Family Home                                      Prior Living Arrangements/Services Living arrangements for the past 2 months: Single Family Home Lives with:: Adult Children Patient language and need for interpreter reviewed:: Yes Do you feel safe going back to the place where you live?: Yes        Care giver support system in place?: Yes (comment)   Criminal Activity/Legal Involvement Pertinent to Current Situation/Hospitalization: No - Comment as needed  Activities of Daily Living   ADL Screening (condition at time of admission) Independently performs ADLs?: Yes (appropriate for developmental age) Is the patient deaf or have difficulty hearing?: No Does the patient have difficulty seeing, even when wearing glasses/contacts?: No Does the patient have difficulty concentrating, remembering, or making decisions?: No  Permission Sought/Granted                  Emotional Assessment Appearance:: Appears stated age Attitude/Demeanor/Rapport: Engaged Affect (typically observed): Accepting Orientation: : Oriented to Self, Oriented to Place   Psych Involvement: No (comment)  Admission diagnosis:  Syncope and collapse  [R55] Hypotension, unspecified hypotension type [I95.9] Syncope, unspecified syncope type [R55] Patient Active Problem List   Diagnosis Date Noted   Syncope and collapse 07/09/2024   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   AKI (acute kidney injury) (HCC) 07/03/2024   Hyponatremia 07/03/2024   Diarrhea 01/13/2024   Dizziness and giddiness 06/16/2023   Hypokalemia 05/29/2023   UTI (urinary tract infection) 05/29/2023   Malnutrition of moderate degree 05/29/2023   Hypoglycemia 05/28/2023   Shortness of breath 05/28/2023   Acute midline low back pain without sciatica 11/29/2019   GERD (gastroesophageal reflux disease) 05/12/2019   Arthritis 05/12/2019   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   Hyperlipidemia 05/12/2019   Overactive bladder 05/12/2019   Chronic anticoagulation 03/18/2018   Late onset Alzheimer's disease without behavioral disturbance (HCC) 01/21/2017   Chronic atrial fibrillation (HCC) 11/02/2015   Essential hypertension 11/02/2015   Type 2 diabetes mellitus without complication, without long-term current use of insulin  (HCC) 11/02/2015   Cognitive impairment 10/31/2015   Chest pain 11/22/2014   Muscle cramps 04/17/2012   Bilateral shoulder pain 03/09/2012   Essential hypertension 03/28/2011   Diabetes mellitus type 2, noninsulin dependent (HCC) 03/28/2011   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 [place] [address] Phone: [phone]  Fax: [phone]  [facility] [address]), [place] - [address] [place] ([place]) [place] [address] Phone: [phone] Fax: [phone]     Social Drivers of Health (SDOH) Social History: SDOH Screenings   Food Insecurity: No Food Insecurity (07/09/2024)  Housing: Low Risk  (07/09/2024)  Transportation Needs: No Transportation Needs (07/09/2024)  Utilities:  Not At Risk (07/09/2024)  Social Connections: Socially Isolated (07/09/2024)  Tobacco Use: Low Risk  (07/09/2024)   SDOH Interventions:     Readmission Risk Interventions    07/07/2024   10:33 AM  Readmission Risk Prevention Plan  Transportation Screening Complete  PCP or Specialist Appt within 5-7 Days Complete  Home Care Screening Complete  Medication Review (RN CM) Complete

## 2024-07-10 NOTE — Progress Notes (Signed)
 NEUROLOGY CONSULT FOLLOW UP NOTE   Date of service: July 10, 2024 Patient Name: Jacqueline Ball MRN:  [id number] DOB:  1941-11-19  Interval Hx/subjective  Continues to have right posterior cervical and occipital pain that is unchanged from yesterday.  Still worsened by palpation and neck movement.  No headache, photophobia, or new or worsening weakness.  She does still have left knee pain especially with movement of the knee but still does not know the onset and there are no known recent falls.  Vitals   Vitals:   07/09/24 2328 07/10/24 0313 07/10/24 0500 07/10/24 0729  BP: (!) 146/84 132/82  (!) 154/76  Pulse: 84 88  75  Resp: 12 18  20   Temp: 98.4 F (36.9 C) 99.7 F (37.6 C)  98.9 F (37.2 C)  TempSrc: Oral Oral  Oral  SpO2: 99% 98%    Weight:   54.1 kg   Height:         Body mass index is 19.85 kg/m.  Physical Exam   Physical Exam Gen: A&O x3 but not situation, NAD HEENT: Atraumatic, normocephalic; oropharynx clear, tongue without atrophy or fasciculations.  Pain extending down into the mid neck with neck flexion and rotation bidirectionally.  Decreased active range of motion in the neck due to pain, but neck is supple when passively rotated and flexed by examiner. Tenderness to palpation in the upper cervical paraspinal musculature and occipital area primarily on the right.  Also with bilateral parietal tenderness to palpation. No tenderness to palpation of the frontalis and temporalis muscles. Negative Brudzinski and Kernig's signs. Resp:  normal work of breathing CV:  extremities appear well-perfused. Extrem: Nml bulk; no cyanosis, clubbing, or edema.  Tenderness to palpation over the left knee primarily patellar area   Neuro: *MS:  A&O x3 but not situation. Follows multi-step commands, sometimes needs repeating  *Speech: no dysarthria or aphasia, able to name and repeat. *CN:    I: Deferred   II: PERRLA, VFF by confrontation, optic discs not visualized 2/2  pupillary constriction   III,IV,VI: EOMI w/o nystagmus, no ptosis   V: Sensation intact from V1 to V3 to LT   VII: Eyelid closure was full.  Smile symmetric.   VIII: Hearing intact to voice   IX,X: Voice normal, palate elevates symmetrically    XI: trap 5/5 bilat, SCM 4/5 bilaterally due to pain XII: Tongue protrudes midline, no atrophy or fasciculations  *Motor:   Normal bulk.  No tremor, rigidity or bradykinesia. No pronator drift. Left hip flexion limited by pain in her left thigh/knee    Strength: Dlt Bic Tri WE WrF FgS Gr HF KnF KnE PlF DoF    Left 5 5 5 5 5 5 5  4+ 5 5 5 5     Right 5 5 5 5 5 5 5 5 5 5 5 5     *Sensory: Intact to light touch, pinprick, temperature vibration throughout. Symmetric. Propioception intact bilat.  No double-simultaneous extinction.  *Coordination:  Finger-to-nose intact. *Reflexes:  2+ and symmetric in bilateral upper extremities. 0 bilateral patellar and achilles reflexes. toes down-going bilat *Gait: Deferred  Medications  Current Facility-Administered Medications:    acetaminophen  (TYLENOL ) tablet 650 mg, 650 mg, Oral, Q6H PRN, 650 mg at 07/09/24 1427 **OR** acetaminophen  (TYLENOL ) suppository 650 mg, 650 mg, Rectal, Q6H PRN, Tobie Mario GAILS, MD   ceFEPIme  (MAXIPIME ) 2 g in sodium chloride  0.9 % 100 mL IVPB, 2 g, Intravenous, Q12H, Tobie Mario GAILS, MD, Last Rate: 200 mL/hr at 07/10/24  9373, 2 g at 07/10/24 9373   Chlorhexidine  Gluconate Cloth 2 % PADS 6 each, 6 each, Topical, Daily, Tobie Mario GAILS, MD   lactated ringers  infusion, , Intravenous, Continuous, Tobie Mario GAILS, MD, Last Rate: 75 mL/hr at 07/10/24 0626, New Bag at 07/10/24 9373   ondansetron  (ZOFRAN ) tablet 4 mg, 4 mg, Oral, Q6H PRN, 4 mg at 07/09/24 1427 **OR** ondansetron  (ZOFRAN ) injection 4 mg, 4 mg, Intravenous, Q6H PRN, Tobie Mario GAILS, MD   vancomycin  (VANCOREADY) IVPB 750 mg/150 mL, 750 mg, Intravenous, Q24H, Tobie Mario GAILS, MD  Labs and Diagnostic Imaging   CBC:  Recent Labs  Lab  07/03/24 1619 07/04/24 0209 07/06/24 0246 07/09/24 1049 07/10/24 0308  WBC 11.7*   < > 7.6 14.8* 10.2  NEUTROABS 9.8*  --  4.8  --   --   HGB 12.5   < > 10.2* 11.9* 10.5*  HCT 37.8   < > 30.3* 38.4 31.8*  MCV 96.2   < > 93.5 102.4* 96.1  PLT 273   < > 205 271 269   < > = values in this interval not displayed.    Basic Metabolic Panel:  Lab Results  Component Value Date   NA 135 07/10/2024   K 4.5 07/10/2024   CO2 23 07/10/2024   GLUCOSE 269 (H) 07/10/2024   BUN 17 07/10/2024   CREATININE 0.97 07/10/2024   CALCIUM  9.3 07/10/2024   GFRNONAA 58 (L) 07/10/2024   GFRAA >60 06/06/2020   Lipid Panel:  Lab Results  Component Value Date   LDLCALC 53 11/23/2014   HgbA1c:  Lab Results  Component Value Date   HGBA1C 11.5 (H) 07/06/2024   Urine Drug Screen:     Component Value Date/Time   LABOPIA NONE DETECTED 03/15/2024 1914   COCAINSCRNUR NONE DETECTED 03/15/2024 1914   LABBENZ NONE DETECTED 03/15/2024 1914   AMPHETMU NONE DETECTED 03/15/2024 1914   THCU NONE DETECTED 03/15/2024 1914   LABBARB NONE DETECTED 03/15/2024 1914    Alcohol Level     Component Value Date/Time   ETH <10 03/15/2024 1728   INR  Lab Results  Component Value Date   INR 1.1 07/09/2024   APTT No results found for: APTT AED levels: No results found for: PHENYTOIN, ZONISAMIDE, LAMOTRIGINE, LEVETIRACETA  CT Head without contrast (Personally reviewed): Age-related atrophy and mild periventricular white matter disease. No apparent acute process.  CT angio Head and Neck with contrast: No large vessel occlusion. Irregularity and focal moderate stenosis of the P2 segment right PCA. Additional mild stenosis of the P2 segment of the left PCA. Scattered atherosclerosis as above. No high-grade stenosis of the arteries in the neck. No CT evidence of acute intracranial abnormality. Chronic microvascular ischemic changes and mild parenchymal volume loss.  MRI Brain w/wo contrast (Personally  reviewed): Age-related atrophy and mild to moderate cerebral white matter disease. No apparent acute process.  Assessment  Jacqueline Ball is an 83 y.o. female with hx of paroxysmal A-fib (on Eliquis ), HTN, T2DM, dementia, and anxiety who was admitted from 07/03/2024 - 07/07/2024 [facility] for acute emphysematous cystitis thought to be caused by recent Klebsiella UTI presenting with signs of systemic infection and concern for meningitis with head and neck pain.  Patient was treated with a third-generation cephalosporin from 07/03/2024 - 07/07/2024 and discharged on a second generation cephalosporin but also took a third-generation cephalosporin at home 07/08/2024-07/09/2024.  She re-presented yesterday (Thursday) after she could not be woken up in the morning; her blood  pressure was found to be 80/40 and she had concurrent headache and neck pain.  - Exam essentially unchanged from yesterday reveals TTP of posterior neck muscles bilaterally in conjunction with pain on volitional and passive head flexion and rotation, but no meningismus. Posterior neck muscles and parietal skull are TTP as well. Mentation is essentially normal and slightly improved from yesterday and she continues without fever - CT of the head without contrast on admission showed no acute processes.  - CT of cervical spine: No acute displaced fracture or traumatic listhesis of the cervical spine. Slightly limited by motion artifact.  - Impression:  - Today she is at her baseline mental status with continued cervicalgia.  Her exam is not consistent with meningitis but rather musculoskeletal pain more likely.  Persistently negative Kernig and Brudzinski sign. She is also having left knee pain without known onset which is causing limitation of strength testing in the left leg. Presentation is very atypical for meningitis and she has shown improvement subjectively and objectively after resuming broad-spectrum antibiotics with cefepime   and vancomycin  yesterday. No indication for LP from a risk/benefit standpoint. A small SAH not detectable by CT was initially thought to be possible, but has essentially been ruled out with MRI brain and there is no aneurysm on CTA.  - Cervicogenic headache due to muscle spasm is felt to be the most likely etiology for her presentation.    RECOMMENDATIONS  - Continue broad-spectrum antibiotics - Can restart Eliquis  - BP management with IVF and encourage hydration.  - Management of her cervicogenic headache and cervicalgia with pain medications, stretching exercises, massage, adequate rest, adequate hydration, hot/cold packs and possibly also muscle relaxants.  - Neurohospitalist service will sign off. Please call if there are additional questions.  ______________________________________________________________________   Bonney Fairy Pool, DO Internal Medicine Resident, PGY-3 9:13 AM 07/10/2024  Electronically signed: Dr. Najmo Pardue

## 2024-07-10 NOTE — Inpatient Diabetes Management (Addendum)
 Inpatient Diabetes Program Recommendations  AACE/ADA: New Consensus Statement on Inpatient Glycemic Control  Target Ranges:  Prepandial:   less than 140 mg/dL      Peak postprandial:   less than 180 mg/dL (1-2 hours)      Critically ill patients:  140 - 180 mg/dL    Latest Reference Range & Units 07/09/24 10:24 07/09/24 21:31 07/10/24 07:09  Glucose-Capillary 70 - 99 mg/dL 882 (H) 835 (H) 773 (H)    Review of Glycemic Control  Diabetes history: DM2 Outpatient Diabetes medications: Lantus  16 units daily, Novolog  5-16 units TID with meals, Metformin  XR 750 mg daily Current orders for Inpatient glycemic control: None  Inpatient Diabetes Program Recommendations:    Insulin : Please consider ordering CBGs AC&HS, Novolog  0-9 units TID with meals and Novolog  0-5 units at bedtime. If CBGs are consistently over 180 mg/dl with Novolog  correction, may want to consider ordering low dose Semglee .  NOTE: Patient admitted with syncope/collapse, headache/neck pain, sepsis, and UTI.  Patient was recently inpatient 07/03/24-07/07/24 and inpatient diabetes coordinator talked with patient and her son on 07/06/24 during that admission.    Thanks, Earnie Gainer, RN, MSN, CDCES Diabetes Coordinator Inpatient Diabetes Program [phone] (Team Pager from 8am to 5pm)

## 2024-07-11 DIAGNOSIS — R55 Syncope and collapse: Secondary | ICD-10-CM | POA: Diagnosis not present

## 2024-07-11 LAB — CBC WITH DIFFERENTIAL/PLATELET
Abs Immature Granulocytes: 0.05 K/uL (ref 0.00–0.07)
Basophils Absolute: 0 K/uL (ref 0.0–0.1)
Basophils Relative: 0 %
Eosinophils Absolute: 0 K/uL (ref 0.0–0.5)
Eosinophils Relative: 0 %
HCT: 34.5 % — ABNORMAL LOW (ref 36.0–46.0)
Hemoglobin: 11.5 g/dL — ABNORMAL LOW (ref 12.0–15.0)
Immature Granulocytes: 0 %
Lymphocytes Relative: 11 %
Lymphs Abs: 1.5 K/uL (ref 0.7–4.0)
MCH: 31.7 pg (ref 26.0–34.0)
MCHC: 33.3 g/dL (ref 30.0–36.0)
MCV: 95 fL (ref 80.0–100.0)
Monocytes Absolute: 1.7 K/uL — ABNORMAL HIGH (ref 0.1–1.0)
Monocytes Relative: 13 %
Neutro Abs: 9.8 K/uL — ABNORMAL HIGH (ref 1.7–7.7)
Neutrophils Relative %: 76 %
Platelets: 265 K/uL (ref 150–400)
RBC: 3.63 MIL/uL — ABNORMAL LOW (ref 3.87–5.11)
RDW: 12.8 % (ref 11.5–15.5)
WBC: 13 K/uL — ABNORMAL HIGH (ref 4.0–10.5)
nRBC: 0 % (ref 0.0–0.2)

## 2024-07-11 LAB — CULTURE, OB URINE: Culture: NO GROWTH

## 2024-07-11 LAB — GLUCOSE, CAPILLARY
Glucose-Capillary: 183 mg/dL — ABNORMAL HIGH (ref 70–99)
Glucose-Capillary: 257 mg/dL — ABNORMAL HIGH (ref 70–99)
Glucose-Capillary: 251 mg/dL — ABNORMAL HIGH (ref 70–99)
Glucose-Capillary: 179 mg/dL — ABNORMAL HIGH (ref 70–99)

## 2024-07-11 LAB — RENAL FUNCTION PANEL
Albumin: 3 g/dL — ABNORMAL LOW (ref 3.5–5.0)
Anion gap: 14 (ref 5–15)
BUN: 9 mg/dL (ref 8–23)
CO2: 22 mmol/L (ref 22–32)
Calcium: 9.3 mg/dL (ref 8.9–10.3)
Chloride: 100 mmol/L (ref 98–111)
Creatinine, Ser: 0.75 mg/dL (ref 0.44–1.00)
GFR, Estimated: >60 mL/min (ref >60)
Glucose, Bld: 165 mg/dL — ABNORMAL HIGH (ref 70–99)
Phosphorus: 2.7 mg/dL (ref 2.5–4.6)
Potassium: 3.6 mmol/L (ref 3.5–5.1)
Sodium: 136 mmol/L (ref 135–145)

## 2024-07-11 LAB — MAGNESIUM: Magnesium: 1.7 mg/dL (ref 1.7–2.4)

## 2024-07-11 MED ORDER — APIXABAN 2.5 MG PO TABS
2.5000 mg | ORAL_TABLET | Freq: Two times a day (BID) | ORAL | Status: DC
  Administered 2024-07-11 – 2024-07-15 (×8): 2.5 mg via ORAL
  Filled 2024-07-11 (×8): qty 1

## 2024-07-11 NOTE — Plan of Care (Signed)
   Problem: Education: Goal: Knowledge of General Education information will improve Description: Including pain rating scale, medication(s)/side effects and non-pharmacologic comfort measures Outcome: Not Progressing   Problem: Health Behavior/Discharge Planning: Goal: Ability to manage health-related needs will improve Outcome: Not Progressing

## 2024-07-11 NOTE — Progress Notes (Signed)
 PROGRESS NOTE    Reneta Niehaus  FMW:[id number] DOB: Aug 13, 1941 DOA: 07/09/2024 PCP: Haze Kingfisher, MD  Outpatient Specialists:     Brief Narrative:  Patient is an 83 year old female past medical history significant for paroxysmal atrial fibrillation on Eliquis , hyperlipidemia, hypertension, type 2 diabetes mellitus with last A1c of 11.5%, dementia and anxiety.  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  On presentation, blood pressure was 80/40 mmHg, associated nausea vomiting and syncope.  Blood pressure has improved.  Leukocytosis has resolved.  Patient is volume depleted.  07/11/2024: Patient seen.  Patient looks much better today.  Will continue antibiotics.  Assessment & Plan:   Principal Problem:   Syncope and collapse   Syncope and collapse: -Likely secondary to volume depletion, hypotension and likely UTI. - Continue to monitor blood pressure closely. - Negative CT. - No further syncope reported. - Hypotension has resolved. 07/11/2024: PT OT input.  UTI/sepsis/acute emphysematous cholecystitis secondary to Klebsiella: -Patient is on cefepime  and vancomycin . - Follow cultures. - De-escalate antibiotics as deemed necessary.  Hypotension: - Resolved.  Volume depletion: - IV normal saline. - Continue to monitor closely. - Echo result noted. 07/11/2024: Resolving.  Type 2 diabetes mellitus: - Last A1c of 11.5%. - Uncontrolled. - Continue to optimize.  Alzheimer's dementia: - No behavioral problems.  Persistent atrial fibrillation: - Restart Eliquis .    Anemia: - Hemoglobin of 10.5 g/dL. - MCV of 96.1. - Likely multifactorial. - Check iron and B12 level.   DVT prophylaxis: SCD. Code Status: Full code. Family Communication:  Disposition Plan: Inpatient.   Consultants:  Neurology.  Procedures:  None.  Antimicrobials:  IV  vancomycin . IV cefepime .   Subjective: No significant history from patient.  Objective: Vitals:   07/11/24 0500 07/11/24 0743 07/11/24 1135 07/11/24 1529  BP:  136/85 135/80 132/89  Pulse:  95 92 90  Resp:  16 20 16   Temp:  98.9 F (37.2 C) 99.7 F (37.6 C) 98.4 F (36.9 C)  TempSrc:  Oral Oral Oral  SpO2:  99% 98% 99%  Weight: 55.5 kg     Height:        Intake/Output Summary (Last 24 hours) at 07/11/2024 1632 Last data filed at 07/11/2024 1554 Gross per 24 hour  Intake 2300 ml  Output 3300 ml  Net -1000 ml   Filed Weights   07/09/24 1020 07/10/24 0500 07/11/24 0500  Weight: 54 kg 54.1 kg 55.5 kg    Examination:  General exam: Appears calm and comfortable.  Dry buccal mucosa.  Patient is pale. Respiratory system: Clear to auscultation.  Cardiovascular system: S1 & S2  Gastrointestinal system: Abdomen is soft and nontender. Central nervous system: Awake and alert.  Extremities: No leg edema.  Data Reviewed: I have personally reviewed following labs and imaging studies  CBC: Recent Labs  Lab 07/05/24 0256 07/06/24 0246 07/09/24 1049 07/10/24 0308 07/11/24 0311  WBC 8.1 7.6 14.8* 10.2 13.0*  NEUTROABS  --  4.8  --   --  9.8*  HGB 10.1* 10.2* 11.9* 10.5* 11.5*  HCT 30.2* 30.3* 38.4 31.8* 34.5*  MCV 93.2 93.5 102.4* 96.1 95.0  PLT 213 205 271 269 265   Basic Metabolic Panel: Recent Labs  Lab 07/05/24 0256 07/06/24 0246 07/09/24 1049 07/10/24 0308 07/11/24 0311  NA 135 137 139 135 136  K 3.5 4.0 3.6 4.5 3.6  CL 105 105 103 100 100  CO2 22 23 22  23 22  GLUCOSE 260* 368* 133* 269* 165*  BUN 19 12 17 17 9   CREATININE 0.90 0.84 1.01* 0.97 0.75  CALCIUM  8.6* 8.6* 9.6 9.3 9.3  MG  --   --   --   --  1.7  PHOS  --   --   --   --  2.7   GFR: Estimated Creatinine Clearance: 47.5 mL/min (by C-G formula based on SCr of 0.75 mg/dL). Liver Function Tests: Recent Labs  Lab 07/09/24 1049 07/10/24 0308 07/11/24 0311  AST 47* 30  --   ALT 68* 50*  --    ALKPHOS 100 83  --   BILITOT 0.6 0.5  --   PROT 6.7 6.0*  --   ALBUMIN 3.3* 2.9* 3.0*   Recent Labs  Lab 07/09/24 1049  LIPASE 42   Recent Labs  Lab 07/09/24 1050  AMMONIA 16   Coagulation Profile: Recent Labs  Lab 07/09/24 2322  INR 1.1   Cardiac Enzymes: Recent Labs  Lab 07/09/24 2322  CKTOTAL 46   BNP (last 3 results) No results for input(s): PROBNP in the last 8760 hours. HbA1C: No results for input(s): HGBA1C in the last 72 hours. CBG: Recent Labs  Lab 07/10/24 1746 07/10/24 1751 07/10/24 2057 07/11/24 0628 07/11/24 1135  GLUCAP >600* 365* 302* 183* 257*   Lipid Profile: No results for input(s): CHOL, HDL, LDLCALC, TRIG, CHOLHDL, LDLDIRECT in the last 72 hours. Thyroid  Function Tests: Recent Labs    07/09/24 1050 07/09/24 2322  TSH 6.675*  --   FREET4  --  0.70   Anemia Panel: Recent Labs    07/09/24 1251 07/09/24 2322  VITAMINB12  --  522  FOLATE 11.4  --    Urine analysis:    Component Value Date/Time   COLORURINE YELLOW 07/09/2024 1045   APPEARANCEUR CLOUDY (A) 07/09/2024 1045   LABSPEC 1.016 07/09/2024 1045   PHURINE 5.0 07/09/2024 1045   GLUCOSEU NEGATIVE 07/09/2024 1045   HGBUR LARGE (A) 07/09/2024 1045   BILIRUBINUR NEGATIVE 07/09/2024 1045   BILIRUBINUR negative 07/16/2023 1939   KETONESUR NEGATIVE 07/09/2024 1045   PROTEINUR 30 (A) 07/09/2024 1045   UROBILINOGEN 0.2 07/16/2023 1939   UROBILINOGEN 0.2 04/02/2020 1041   NITRITE NEGATIVE 07/09/2024 1045   LEUKOCYTESUR MODERATE (A) 07/09/2024 1045   Sepsis Labs: @LABRCNTIP (procalcitonin:4,lacticidven:4)  ) Recent Results (from the past 240 hours)  Urine Culture     Status: Abnormal   Collection Time: 07/03/24  4:05 PM   Specimen: Urine, Random  Result Value Ref Range Status   Specimen Description URINE, RANDOM  Final   Special Requests   Final    NONE Reflexed from [id number] Performed at [facility], [address] [place], [place] [address]     Culture >=100,000 COLONIES/mL KLEBSIELLA PNEUMONIAE (A)  Final   Report Status 07/07/2024 FINAL  Final   Organism ID, Bacteria KLEBSIELLA PNEUMONIAE (A)  Final      Susceptibility   Klebsiella pneumoniae - MIC*    AMPICILLIN  RESISTANT Resistant     CEFAZOLIN <=4 SENSITIVE Sensitive     CEFEPIME  <=0.12 SENSITIVE Sensitive     CEFTRIAXONE  <=0.25 SENSITIVE Sensitive     CIPROFLOXACIN <=0.25 SENSITIVE Sensitive     GENTAMICIN <=1 SENSITIVE Sensitive     IMIPENEM <=0.25 SENSITIVE Sensitive     NITROFURANTOIN <=16 SENSITIVE Sensitive     TRIMETH /SULFA  <=20 SENSITIVE Sensitive     AMPICILLIN /SULBACTAM <=2 SENSITIVE Sensitive     PIP/TAZO <=4 SENSITIVE Sensitive ug/mL    * >=  100,000 COLONIES/mL KLEBSIELLA PNEUMONIAE  Blood culture (routine x 2)     Status: None   Collection Time: 07/03/24  5:50 PM   Specimen: BLOOD  Result Value Ref Range Status   Specimen Description BLOOD SITE NOT SPECIFIED  Final   Special Requests   Final    BOTTLES DRAWN AEROBIC AND ANAEROBIC Blood Culture results may not be optimal due to an inadequate volume of blood received in culture bottles   Culture   Final    NO GROWTH 5 DAYS Performed at [facility], [address], [place], [place] [address]    Report Status 07/08/2024 FINAL  Final  MRSA Next Gen by PCR, Nasal     Status: None   Collection Time: 07/03/24 11:31 PM   Specimen: Nasal Mucosa; Nasal Swab  Result Value Ref Range Status   MRSA by PCR Next Gen NOT DETECTED NOT DETECTED Final    Comment: (NOTE) The GeneXpert MRSA Assay (FDA approved for NASAL specimens only), is one component of a comprehensive MRSA colonization surveillance program. It is not intended to diagnose MRSA infection nor to guide or monitor treatment for MRSA infections. Test performance is not FDA approved in patients less than 63 years old. Performed at [facility], [address] [place], [place] [address]   Blood culture (routine x 2)     Status: None    Collection Time: 07/03/24 11:49 PM   Specimen: BLOOD LEFT ARM  Result Value Ref Range Status   Specimen Description BLOOD LEFT ARM  Final   Special Requests   Final    BOTTLES DRAWN AEROBIC AND ANAEROBIC Blood Culture adequate volume   Culture   Final    NO GROWTH 5 DAYS Performed at [facility], [address] [place], [place] [address]    Report Status 07/09/2024 FINAL  Final  Blood culture (routine x 2)     Status: None (Preliminary result)   Collection Time: 07/09/24 10:40 AM   Specimen: BLOOD LEFT HAND  Result Value Ref Range Status   Specimen Description BLOOD LEFT HAND  Final   Special Requests   Final    BOTTLES DRAWN AEROBIC ONLY Blood Culture results may not be optimal due to an inadequate volume of blood received in culture bottles   Culture   Final    NO GROWTH < 24 HOURS Performed at [facility], [address] [place], [place] [address]    Report Status PENDING  Incomplete  Resp panel by RT-PCR (RSV, Flu A&B, Covid) Anterior Nasal Swab     Status: None   Collection Time: 07/09/24 10:42 AM   Specimen: Anterior Nasal Swab  Result Value Ref Range Status   SARS Coronavirus 2 by RT PCR NEGATIVE NEGATIVE Final   Influenza A by PCR NEGATIVE NEGATIVE Final   Influenza B by PCR NEGATIVE NEGATIVE Final    Comment: (NOTE) The Xpert Xpress SARS-CoV-2/FLU/RSV plus assay is intended as an aid in the diagnosis of influenza from Nasopharyngeal swab specimens and should not be used as a sole basis for treatment. Nasal washings and aspirates are unacceptable for Xpert Xpress SARS-CoV-2/FLU/RSV testing.  Fact Sheet for Patients: BloggerCourse.com  Fact Sheet for Healthcare Providers: SeriousBroker.it  This test is not yet approved or cleared by the [place]  FDA and has been authorized for detection and/or diagnosis of SARS-CoV-2 by FDA under an Emergency Use Authorization (EUA). This EUA will remain in  effect (meaning this test can be  used) for the duration of the COVID-19 declaration under Section 564(b)(1) of the Act, 21 U.S.C. section 360bbb-3(b)(1), unless the authorization is terminated or revoked.     Resp Syncytial Virus by PCR NEGATIVE NEGATIVE Final    Comment: (NOTE) Fact Sheet for Patients: BloggerCourse.com  Fact Sheet for Healthcare Providers: SeriousBroker.it  This test is not yet approved or cleared by the [place]  FDA and has been authorized for detection and/or diagnosis of SARS-CoV-2 by FDA under an Emergency Use Authorization (EUA). This EUA will remain in effect (meaning this test can be used) for the duration of the COVID-19 declaration under Section 564(b)(1) of the Act, 21 U.S.C. section 360bbb-3(b)(1), unless the authorization is terminated or revoked.  Performed at [facility], [address], [place], [place] [address]   Blood culture (routine x 2)     Status: None (Preliminary result)   Collection Time: 07/09/24 10:45 AM   Specimen: BLOOD LEFT HAND  Result Value Ref Range Status   Specimen Description BLOOD LEFT HAND  Final   Special Requests   Final    BOTTLES DRAWN AEROBIC ONLY Blood Culture results may not be optimal due to an inadequate volume of blood received in culture bottles   Culture   Final    NO GROWTH < 24 HOURS Performed at [facility], [address] [place], [place] [address]    Report Status PENDING  Incomplete         Radiology Studies: MR BRAIN W WO CONTRAST Result Date: 07/10/2024 CLINICAL DATA:  Possible meningitis or subtle SAH EXAM: MRI HEAD WITHOUT AND WITH CONTRAST TECHNIQUE: Multiplanar, multiecho pulse sequences of the brain and surrounding structures were obtained without and with intravenous contrast. CONTRAST:  5.5mL GADAVIST  GADOBUTROL  1 MMOL/ML IV SOLN COMPARISON:  CT the head dated July 09, 2024. FINDINGS: Brain: There is no restricted  diffusion to indicate acute or recent infarction. There is age-related atrophy and mild-to-moderate periventricular white matter disease. There is no evidence of hemorrhage, mass, cortical infarct or hydrocephalus. There is no abnormal parenchymal or meningeal disease. Vascular: Normal flow voids. Skull and upper cervical spine: Normal marrow signal. No osseous lesions. Sinuses/Orbits: Clear paranasal sinuses. Status post bilateral lens replacement. Other: None. IMPRESSION: 1. Age-related atrophy and mild to moderate cerebral white matter disease. No apparent acute process. Electronically Signed   By: Evalene Coho M.D.   On: 07/10/2024 15:19   ECHOCARDIOGRAM COMPLETE Result Date: 07/10/2024    ECHOCARDIOGRAM REPORT   Patient Name:   Teyah Rossy Date of Exam: 07/10/2024 Medical Rec #:  [id number]            Height:       65.0 in Accession #:    [id number]           Weight:       119.3 lb Date of Birth:  [birth year]            BSA:          1.588 m Patient Age:    83 years             BP:           154/76 mmHg Patient Gender: F                    HR:           86 bpm. Exam Location:  Inpatient Procedure: 2D Echo, Color Doppler and Cardiac Doppler (Both Spectral and Color  Flow Doppler were utilized during procedure). Indications:    Syncope R55  History:        Patient has no prior history of Echocardiogram examinations.  Sonographer:    Tinnie Gosling RDCS Referring Phys: MARIO GAILS PATEL IMPRESSIONS  1. Left ventricular ejection fraction, by estimation, is 60 to 65%. The left ventricle has normal function. The left ventricle has no regional wall motion abnormalities. There is mild asymmetric left ventricular hypertrophy of the basal-septal segment. Left ventricular diastolic parameters were normal.  2. Right ventricular systolic function is normal. The right ventricular size is normal.  3. The mitral valve is normal in structure. No evidence of mitral valve regurgitation. No evidence of mitral  stenosis.  4. The aortic valve is tricuspid. Aortic valve regurgitation is not visualized. No aortic stenosis is present.  5. The inferior vena cava is normal in size with greater than 50% respiratory variability, suggesting right atrial pressure of 3 mmHg. FINDINGS  Left Ventricle: Left ventricular ejection fraction, by estimation, is 60 to 65%. The left ventricle has normal function. The left ventricle has no regional wall motion abnormalities. The left ventricular internal cavity size was normal in size. There is  mild asymmetric left ventricular hypertrophy of the basal-septal segment. Left ventricular diastolic parameters were normal. Right Ventricle: The right ventricular size is normal. No increase in right ventricular wall thickness. Right ventricular systolic function is normal. Left Atrium: Left atrial size was normal in size. Right Atrium: Right atrial size was normal in size. Pericardium: There is no evidence of pericardial effusion. Mitral Valve: The mitral valve is normal in structure. No evidence of mitral valve regurgitation. No evidence of mitral valve stenosis. Tricuspid Valve: The tricuspid valve is normal in structure. Tricuspid valve regurgitation is not demonstrated. No evidence of tricuspid stenosis. Aortic Valve: The aortic valve is tricuspid. Aortic valve regurgitation is not visualized. No aortic stenosis is present. Pulmonic Valve: The pulmonic valve was normal in structure. Pulmonic valve regurgitation is not visualized. No evidence of pulmonic stenosis. Aorta: The aortic root is normal in size and structure. Venous: The inferior vena cava is normal in size with greater than 50% respiratory variability, suggesting right atrial pressure of 3 mmHg. IAS/Shunts: No atrial level shunt detected by color flow Doppler.  LEFT VENTRICLE PLAX 2D LVIDd:         3.50 cm   Diastology LVIDs:         2.50 cm   LV e' medial:    10.20 cm/s LV PW:         0.90 cm   LV E/e' medial:  7.5 LV IVS:        1.00  cm   LV e' lateral:   8.92 cm/s LVOT diam:     1.70 cm   LV E/e' lateral: 8.6 LV SV:         49 LV SV Index:   31 LVOT Area:     2.27 cm  RIGHT VENTRICLE             IVC RV S prime:     13.20 cm/s  IVC diam: 1.40 cm TAPSE (M-mode): 1.6 cm LEFT ATRIUM           Index LA diam:      3.00 cm 1.89 cm/m LA Vol (A4C): 36.8 ml 23.17 ml/m  AORTIC VALVE LVOT Vmax:   116.00 cm/s LVOT Vmean:  74.700 cm/s LVOT VTI:    0.216 m  AORTA Ao Root diam: 2.90 cm  Ao Asc diam:  3.20 cm MITRAL VALVE MV Area (PHT): 3.85 cm     SHUNTS MV Decel Time: 197 msec     Systemic VTI:  0.22 m MV E velocity: 76.50 cm/s   Systemic Diam: 1.70 cm MV A velocity: 108.00 cm/s MV E/A ratio:  0.71 Morene Brownie Electronically signed by Morene Brownie Signature Date/Time: 07/10/2024/12:10:59 PM    Final    CT ANGIO HEAD NECK W WO CM Result Date: 07/10/2024 CLINICAL DATA:  Headache, new onset.  Syncope/presyncope. EXAM: CT ANGIOGRAPHY HEAD AND NECK WITH AND WITHOUT CONTRAST TECHNIQUE: Multidetector CT imaging of the head and neck was performed using the standard protocol during bolus administration of intravenous contrast. Multiplanar CT image reconstructions and MIPs were obtained to evaluate the vascular anatomy. Carotid stenosis measurements (when applicable) are obtained utilizing NASCET criteria, using the distal internal carotid diameter as the denominator. RADIATION DOSE REDUCTION: This exam was performed according to the departmental dose-optimization program which includes automated exposure control, adjustment of the mA and/or kV according to patient size and/or use of iterative reconstruction technique. CONTRAST:  75mL OMNIPAQUE  IOHEXOL  350 MG/ML SOLN COMPARISON:  CT head 07/09/2024. FINDINGS: CT HEAD FINDINGS Brain: No acute intracranial hemorrhage. No CT evidence of acute infarct. Nonspecific hypoattenuation in the periventricular and subcortical white matter favored to reflect chronic microvascular ischemic changes. Mild parenchymal  volume loss. No edema, mass effect, or midline shift. The basilar cisterns are patent. Ventricles: The ventricles are normal. Vascular: Atherosclerotic calcifications of the carotid siphons. No hyperdense vessel. Skull: No acute or aggressive finding. Orbits: Orbits are symmetric. Sinuses: The visualized paranasal sinuses are clear. Other: Mastoid air cells are clear. CTA NECK FINDINGS Aortic arch: Common origin of the brachiocephalic and left common carotid arteries. Imaged portion shows no evidence of aneurysm or dissection. No significant stenosis of the major arch vessel origins. Pulmonary arteries: As permitted by contrast timing, there are no filling defects in the visualized pulmonary arteries. Subclavian arteries: The subclavian arteries are patent bilaterally. Right carotid system: No evidence of dissection, stenosis (50% or greater), or occlusion. Minimal atherosclerosis at the carotid bifurcation. Left carotid system: No evidence of dissection, stenosis (50% or greater), or occlusion. Vertebral arteries: Codominant. No evidence of dissection, stenosis (50% or greater), or occlusion. Tortuosity of the bilateral V1 and V2 segments. Skeleton: No acute findings. Degenerative changes in the cervical spine. Intervertebral disc space narrowing and degenerative endplate osteophytes most pronounced at C3-4. Dental caries. Chronic appearing deformity of the C7 and T1 spinous processes. Other neck: The visualized airway is patent. No cervical lymphadenopathy. Upper chest: Visualized lung apices are clear. Review of the MIP images confirms the above findings CTA HEAD FINDINGS ANTERIOR CIRCULATION: The intracranial ICAs are patent bilaterally. Mild scattered atherosclerosis in the carotid siphons. No significant stenosis, proximal occlusion, aneurysm, or vascular malformation. MCAs: The middle cerebral arteries are patent bilaterally. ACAs: The anterior cerebral arteries are patent bilaterally. POSTERIOR CIRCULATION:  No significant stenosis, proximal occlusion, aneurysm, or vascular malformation. PCAs: Patent bilaterally. Irregularity of the right P2 segment with focal moderate stenosis. There is additional mild irregularity and mild narrowing of the P2 segment of the left PCA. Pcomm: The posterior communicating arteries are visualized bilaterally. SCAs: The superior cerebellar arteries are patent bilaterally. Basilar artery: Patent AICAs: Not well visualized. PICAs: Patent Vertebral arteries: The intracranial vertebral arteries are patent. Venous sinuses: As permitted by contrast timing, patent. Anatomic variants: None Review of the MIP images confirms the above findings IMPRESSION: No large vessel occlusion. Irregularity and  focal moderate stenosis of the P2 segment right PCA. Additional mild stenosis of the P2 segment of the left PCA. Scattered atherosclerosis as above. No high-grade stenosis of the arteries in the neck. No CT evidence of acute intracranial abnormality. Chronic microvascular ischemic changes and mild parenchymal volume loss. Electronically Signed   By: Donnice Mania M.D.   On: 07/10/2024 11:11   CT CERVICAL SPINE WO CONTRAST Result Date: 07/09/2024 CLINICAL DATA:  neck pain not sure if we can see c spine on her ct head or if we can do it as no charge. thanks EXAM: CT CERVICAL SPINE WITHOUT CONTRAST TECHNIQUE: Multidetector CT imaging of the cervical spine was performed without intravenous contrast. Multiplanar CT image reconstructions were also generated. RADIATION DOSE REDUCTION: This exam was performed according to the departmental dose-optimization program which includes automated exposure control, adjustment of the mA and/or kV according to patient size and/or use of iterative reconstruction technique. COMPARISON:  CT C-spine 12/28/2022. FINDINGS: Alignment: Reversal of the normal cervical lordosis centered at the C3-C5 levels likely due to positioning and degenerative changes. Grade 1 anterolisthesis  of C2 on C3. Mild retrolisthesis of C5 on C6 and C6 on C7. Skull base and vertebrae: Slight motion artifact. Multilevel moderate degenerative changes spine. No associated severe osseous neural foraminal or central canal stenosis. No acute fracture. No aggressive appearing focal osseous lesion or focal pathologic process. Soft tissues and spinal canal: No prevertebral fluid or swelling. No visible canal hematoma. Upper chest: Unremarkable. Other: None. IMPRESSION: No acute displaced fracture or traumatic listhesis of the cervical spine. Slightly limited by motion artifact. Electronically Signed   By: Morgane  Naveau M.D.   On: 07/09/2024 18:04        Scheduled Meds:  Chlorhexidine  Gluconate Cloth  6 each Topical Daily   insulin  aspart  0-5 Units Subcutaneous QHS   insulin  aspart  0-9 Units Subcutaneous TID WC   Continuous Infusions:  sodium chloride  75 mL/hr at 07/11/24 9297   ceFEPime  (MAXIPIME ) IV 2 g (07/11/24 0902)   vancomycin  750 mg (07/11/24 1110)     LOS: 2 days    Time spent: 35 minutes    Leatrice Chapel, MD  [facility] Pager #: [phone] 7PM-7AM contact night coverage as above

## 2024-07-12 LAB — GLUCOSE, CAPILLARY
Glucose-Capillary: 189 mg/dL — ABNORMAL HIGH (ref 70–99)
Glucose-Capillary: 311 mg/dL — ABNORMAL HIGH (ref 70–99)
Glucose-Capillary: 241 mg/dL — ABNORMAL HIGH (ref 70–99)
Glucose-Capillary: 242 mg/dL — ABNORMAL HIGH (ref 70–99)

## 2024-07-12 MED ORDER — DOCUSATE SODIUM 100 MG PO CAPS
100.0000 mg | ORAL_CAPSULE | Freq: Two times a day (BID) | ORAL | Status: DC
  Administered 2024-07-12 – 2024-07-15 (×6): 100 mg via ORAL
  Filled 2024-07-12 (×6): qty 1

## 2024-07-12 NOTE — Progress Notes (Signed)
 PROGRESS NOTE    Jacqueline Ball  FMW:[id number] DOB: Jan 07, 1941 DOA: 07/09/2024 PCP: Haze Kingfisher, MD  Outpatient Specialists:     Brief Narrative:  Patient is an 83 year old female past medical history significant for paroxysmal atrial fibrillation on Eliquis , hyperlipidemia, hypertension, type 2 diabetes mellitus with last A1c of 11.5%, dementia and anxiety.  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  On presentation, blood pressure was 80/40 mmHg, associated nausea vomiting and syncope.  Blood pressure has improved.  Leukocytosis has resolved.  Patient is volume depleted.  07/12/2024: Patient seen.  Patient looks much better today.  Will discontinue vancomycin .  Temperature 101.2 F noted around 12 midnight.    Assessment & Plan:   Principal Problem:   Syncope and collapse   Syncope and collapse: -Likely secondary to volume depletion, hypotension and likely UTI. - Continue to monitor blood pressure closely. - Negative CT. - No further syncope reported. - Hypotension has resolved. [id number]: PT OT input.  UTI/sepsis/acute emphysematous cholecystitis secondary to Klebsiella: -Patient is on cefepime .  Will discontinue vancomycin .  - Negative cultures to date.  Hypotension: - Resolved.  Volume depletion: - Improved significantly. - IV normal saline. - Continue to monitor closely. - Echo result noted.  Type 2 diabetes mellitus: - Last A1c of 11.5%. - Uncontrolled. - Continue to optimize.  Alzheimer's dementia: - No behavioral problems.  Persistent atrial fibrillation: - Restart Eliquis .    Anemia: - Hemoglobin of 10.5 g/dL. - MCV of 96.1. - Likely multifactorial. - Check iron and B12 level.   DVT prophylaxis: SCD. Code Status: Full code. Family Communication:  Disposition Plan: Inpatient.   Consultants:  Neurology.  Procedures:   None.  Antimicrobials:  IV vancomycin . IV cefepime .   Subjective: No significant history from patient.  Objective: Vitals:   07/12/24 0454 07/12/24 0735 07/12/24 1159 07/12/24 1533  BP:  (!) 117/58 110/73 136/80  Pulse:  81 85 80  Resp:  16 18   Temp:  97.9 F (36.6 C) 98.2 F (36.8 C) 98.6 F (37 C)  TempSrc:  Oral Oral Oral  SpO2:  96% 99% 98%  Weight: 54.7 kg     Height:        Intake/Output Summary (Last 24 hours) at 07/12/2024 1855 Last data filed at 07/12/2024 1800 Gross per 24 hour  Intake 3266.97 ml  Output 1650 ml  Net 1616.97 ml   Filed Weights   07/10/24 0500 07/11/24 0500 07/12/24 0454  Weight: 54.1 kg 55.5 kg 54.7 kg    Examination:  General exam: Appears calm and comfortable.  Dry buccal mucosa.  Patient is pale. Respiratory system: Clear to auscultation.  Cardiovascular system: S1 & S2  Gastrointestinal system: Abdomen is soft and nontender. Central nervous system: Awake and alert.  Extremities: No leg edema.  Data Reviewed: I have personally reviewed following labs and imaging studies  CBC: Recent Labs  Lab 07/06/24 0246 07/09/24 1049 07/10/24 0308 07/11/24 0311  WBC 7.6 14.8* 10.2 13.0*  NEUTROABS 4.8  --   --  9.8*  HGB 10.2* 11.9* 10.5* 11.5*  HCT 30.3* 38.4 31.8* 34.5*  MCV 93.5 102.4* 96.1 95.0  PLT 205 271 269 265   Basic Metabolic Panel: Recent Labs  Lab 07/06/24 0246 07/09/24 1049 07/10/24 0308 07/11/24 0311  NA 137 139 135 136  K 4.0 3.6 4.5 3.6  CL 105 103 100 100  CO2 23 22 23 22   GLUCOSE 368*  133* 269* 165*  BUN 12 17 17 9   CREATININE 0.84 1.01* 0.97 0.75  CALCIUM  8.6* 9.6 9.3 9.3  MG  --   --   --  1.7  PHOS  --   --   --  2.7   GFR: Estimated Creatinine Clearance: 46.8 mL/min (by C-G formula based on SCr of 0.75 mg/dL). Liver Function Tests: Recent Labs  Lab 07/09/24 1049 07/10/24 0308 07/11/24 0311  AST 47* 30  --   ALT 68* 50*  --   ALKPHOS 100 83  --   BILITOT 0.6 0.5  --   PROT 6.7 6.0*  --    ALBUMIN 3.3* 2.9* 3.0*   Recent Labs  Lab 07/09/24 1049  LIPASE 42   Recent Labs  Lab 07/09/24 1050  AMMONIA 16   Coagulation Profile: Recent Labs  Lab 07/09/24 2322  INR 1.1   Cardiac Enzymes: Recent Labs  Lab 07/09/24 2322  CKTOTAL 46   BNP (last 3 results) No results for input(s): PROBNP in the last 8760 hours. HbA1C: No results for input(s): HGBA1C in the last 72 hours. CBG: Recent Labs  Lab 07/11/24 1650 07/11/24 2103 07/12/24 0609 07/12/24 1234 07/12/24 1653  GLUCAP 251* 179* 189* 311* 241*   Lipid Profile: No results for input(s): CHOL, HDL, LDLCALC, TRIG, CHOLHDL, LDLDIRECT in the last 72 hours. Thyroid  Function Tests: Recent Labs    07/09/24 2322  FREET4 0.70   Anemia Panel: Recent Labs    07/09/24 2322  VITAMINB12 522   Urine analysis:    Component Value Date/Time   COLORURINE YELLOW 07/09/2024 1045   APPEARANCEUR CLOUDY (A) 07/09/2024 1045   LABSPEC 1.016 07/09/2024 1045   PHURINE 5.0 07/09/2024 1045   GLUCOSEU NEGATIVE 07/09/2024 1045   HGBUR LARGE (A) 07/09/2024 1045   BILIRUBINUR NEGATIVE 07/09/2024 1045   BILIRUBINUR negative 07/16/2023 1939   KETONESUR NEGATIVE 07/09/2024 1045   PROTEINUR 30 (A) 07/09/2024 1045   UROBILINOGEN 0.2 07/16/2023 1939   UROBILINOGEN 0.2 04/02/2020 1041   NITRITE NEGATIVE 07/09/2024 1045   LEUKOCYTESUR MODERATE (A) 07/09/2024 1045   Sepsis Labs: @LABRCNTIP (procalcitonin:4,lacticidven:4)  ) Recent Results (from the past 240 hours)  Urine Culture     Status: Abnormal   Collection Time: 07/03/24  4:05 PM   Specimen: Urine, Random  Result Value Ref Range Status   Specimen Description URINE, RANDOM  Final   Special Requests   Final    NONE Reflexed from [id number] Performed at [facility] Lab, [address], [place], [place] [address]    Culture >=100,000 COLONIES/mL KLEBSIELLA PNEUMONIAE (A)  Final   Report Status 07/07/2024 FINAL  Final   Organism ID, Bacteria KLEBSIELLA  PNEUMONIAE (A)  Final      Susceptibility   Klebsiella pneumoniae - MIC*    AMPICILLIN  RESISTANT Resistant     CEFAZOLIN <=4 SENSITIVE Sensitive     CEFEPIME  <=0.12 SENSITIVE Sensitive     CEFTRIAXONE  <=0.25 SENSITIVE Sensitive     CIPROFLOXACIN <=0.25 SENSITIVE Sensitive     GENTAMICIN <=1 SENSITIVE Sensitive     IMIPENEM <=0.25 SENSITIVE Sensitive     NITROFURANTOIN <=16 SENSITIVE Sensitive     TRIMETH /SULFA  <=20 SENSITIVE Sensitive     AMPICILLIN /SULBACTAM <=2 SENSITIVE Sensitive     PIP/TAZO <=4 SENSITIVE Sensitive ug/mL    * >=100,000 COLONIES/mL KLEBSIELLA PNEUMONIAE  Blood culture (routine x 2)     Status: None   Collection Time: 07/03/24  5:50 PM   Specimen: BLOOD  Result Value Ref Range  Status   Specimen Description BLOOD SITE NOT SPECIFIED  Final   Special Requests   Final    BOTTLES DRAWN AEROBIC AND ANAEROBIC Blood Culture results may not be optimal due to an inadequate volume of blood received in culture bottles   Culture   Final    NO GROWTH 5 DAYS Performed at [facility] Lab, [address] [place], [place] [address]    Report Status 07/08/2024 FINAL  Final  MRSA Next Gen by PCR, Nasal     Status: None   Collection Time: 07/03/24 11:31 PM   Specimen: Nasal Mucosa; Nasal Swab  Result Value Ref Range Status   MRSA by PCR Next Gen NOT DETECTED NOT DETECTED Final    Comment: (NOTE) The GeneXpert MRSA Assay (FDA approved for NASAL specimens only), is one component of a comprehensive MRSA colonization surveillance program. It is not intended to diagnose MRSA infection nor to guide or monitor treatment for MRSA infections. Test performance is not FDA approved in patients less than 64 years old. Performed at [facility], [address] [place], [place] [address]   Blood culture (routine x 2)     Status: None   Collection Time: 07/03/24 11:49 PM   Specimen: BLOOD LEFT ARM  Result Value Ref Range Status   Specimen Description BLOOD LEFT ARM  Final    Special Requests   Final    BOTTLES DRAWN AEROBIC AND ANAEROBIC Blood Culture adequate volume   Culture   Final    NO GROWTH 5 DAYS Performed at [facility], [address], [place], [place] [address]    Report Status 07/09/2024 FINAL  Final  Blood culture (routine x 2)     Status: None (Preliminary result)   Collection Time: 07/09/24 10:40 AM   Specimen: BLOOD LEFT HAND  Result Value Ref Range Status   Specimen Description BLOOD LEFT HAND  Final   Special Requests   Final    BOTTLES DRAWN AEROBIC ONLY Blood Culture results may not be optimal due to an inadequate volume of blood received in culture bottles   Culture   Final    NO GROWTH 3 DAYS Performed at [facility], [address] [place], [place] [address]    Report Status PENDING  Incomplete  Resp panel by RT-PCR (RSV, Flu A&B, Covid) Anterior Nasal Swab     Status: None   Collection Time: 07/09/24 10:42 AM   Specimen: Anterior Nasal Swab  Result Value Ref Range Status   SARS Coronavirus 2 by RT PCR NEGATIVE NEGATIVE Final   Influenza A by PCR NEGATIVE NEGATIVE Final   Influenza B by PCR NEGATIVE NEGATIVE Final    Comment: (NOTE) The Xpert Xpress SARS-CoV-2/FLU/RSV plus assay is intended as an aid in the diagnosis of influenza from Nasopharyngeal swab specimens and should not be used as a sole basis for treatment. Nasal washings and aspirates are unacceptable for Xpert Xpress SARS-CoV-2/FLU/RSV testing.  Fact Sheet for Patients: BloggerCourse.com  Fact Sheet for Healthcare Providers: SeriousBroker.it  This test is not yet approved or cleared by the [place]  FDA and has been authorized for detection and/or diagnosis of SARS-CoV-2 by FDA under an Emergency Use Authorization (EUA). This EUA will remain in effect (meaning this test can be used) for the duration of the COVID-19 declaration under Section 564(b)(1) of the Act, 21 U.S.C. section  360bbb-3(b)(1), unless the authorization is terminated or revoked.     Resp Syncytial Virus by PCR  NEGATIVE NEGATIVE Final    Comment: (NOTE) Fact Sheet for Patients: BloggerCourse.com  Fact Sheet for Healthcare Providers: SeriousBroker.it  This test is not yet approved or cleared by the [place]  FDA and has been authorized for detection and/or diagnosis of SARS-CoV-2 by FDA under an Emergency Use Authorization (EUA). This EUA will remain in effect (meaning this test can be used) for the duration of the COVID-19 declaration under Section 564(b)(1) of the Act, 21 U.S.C. section 360bbb-3(b)(1), unless the authorization is terminated or revoked.  Performed at [facility] Lab, [address] [place], [place] [address]   Blood culture (routine x 2)     Status: None (Preliminary result)   Collection Time: 07/09/24 10:45 AM   Specimen: BLOOD LEFT HAND  Result Value Ref Range Status   Specimen Description BLOOD LEFT HAND  Final   Special Requests   Final    BOTTLES DRAWN AEROBIC ONLY Blood Culture results may not be optimal due to an inadequate volume of blood received in culture bottles   Culture   Final    NO GROWTH 3 DAYS Performed at [facility] Lab, [address], [place], [place] [address]    Report Status PENDING  Incomplete  Culture, OB Urine     Status: None   Collection Time: 07/10/24  6:10 PM   Specimen: Urine, Random  Result Value Ref Range Status   Specimen Description URINE, RANDOM  Final   Special Requests NONE  Final   Culture   Final    NO GROWTH NO GROUP B STREP (S.AGALACTIAE) ISOLATED Performed at [facility] Lab, [address] [place], [place] [address]    Report Status 07/11/2024 FINAL  Final         Radiology Studies: No results found.       Scheduled Meds:  apixaban   2.5 mg Oral BID   Chlorhexidine  Gluconate Cloth  6 each Topical Daily   insulin  aspart  0-5 Units  Subcutaneous QHS   insulin  aspart  0-9 Units Subcutaneous TID WC   Continuous Infusions:  sodium chloride  75 mL/hr at 07/12/24 1412   ceFEPime  (MAXIPIME ) IV 2 g (07/12/24 0852)     LOS: 3 days    Time spent: 35 minutes    Leatrice Chapel, MD  [facility] Pager #: [phone] 7PM-7AM contact night coverage as above

## 2024-07-12 NOTE — Evaluation (Signed)
 Occupational Therapy Evaluation Patient Details Name: Jacqueline Ball MRN: [id number] DOB: 1941-09-03 Today's Date: 07/12/2024   History of Present Illness   Pt is an 83 y.o. female who presented 07/09/24 with syncope episode and nausea and vomiting. Pt recently admitted on 07/03/2024 and discharged on 07/07/2024 for emphysematous cystitis in the setting of Klebsiella UTI. Syncope suspected to be due to volume depletion, hypotension and likely UTI. PMH: dementia, paroxysmal afib, HLD, HTN, DM2, anxiety     Clinical Impressions Pt admitted for above, PTA pt lived with family and had assist with her ADLs/iADLs. She is a questionable historian at this time so setup and PLOF obtained from prior charts. Pt currently presenting with impaired ability to process and follow commands, c/o generalized pain but denies any falls. Pt needing max A for bed mobility and grossly max A to total A +2 for ADLs with exception of some UB washing CGA. OT to continue following pt acutely to address listed deficits and help progress pt as able. Patient will benefit from continued inpatient follow up therapy, <3 hours/day      If plan is discharge home, recommend the following:   A lot of help with walking and/or transfers;Two people to help with walking and/or transfers;A lot of help with bathing/dressing/bathroom;Two people to help with bathing/dressing/bathroom;Assistance with cooking/housework;Direct supervision/assist for financial management;Supervision due to cognitive status;Help with stairs or ramp for entrance;Direct supervision/assist for medications management     Functional Status Assessment   Patient has had a recent decline in their functional status and demonstrates the ability to make significant improvements in function in a reasonable and predictable amount of time.     Equipment Recommendations   Audiological scientist for [employer]          Precautions/Restrictions   Precautions Precautions: Fall Recall of Precautions/Restrictions: Impaired Restrictions [employer] Bearing Restrictions Per Provider Order: No     Mobility Bed Mobility Overal bed mobility: Needs Assistance Bed Mobility: Supine to Sit, Sit to Supine     Supine to sit: Used rails, Max assist, HOB elevated Sit to supine: Max assist, Used rails   General bed mobility comments: cue for pt to assist by holding bed rails, needing max A for BLE management and rising trunk. Pt with strong grip onto OT shirt during transition.    Transfers                   General transfer comment: Unable, attempted to perform some STS but pt noted to not be attempting efforts of standing at all. Likely to need +2 assist      Balance Overall balance assessment: Needs assistance Sitting-balance support: Feet supported, No upper extremity supported Sitting balance-Leahy Scale: Fair Sitting balance - Comments: intermittent min A to CGA Postural control: Posterior lean                                 ADL either performed or assessed with clinical judgement   ADL Overall ADL's : Needs assistance/impaired Eating/Feeding: Bed level;Set up   Grooming: Sitting;Wash/dry face;Contact guard assist Grooming Details (indicate cue type and reason): no cues needed for initiation Upper Body Bathing: Sitting;Contact guard assist   Lower Body Bathing: Moderate assistance;Sitting/lateral leans   Upper Body Dressing : Sitting;Moderate assistance   Lower Body Dressing: Maximal assistance;Sitting/lateral leans   Toilet Transfer: +2 for physical assistance;Total assistance;+2 for safety/equipment Toilet Transfer Details (indicate cue  type and reason): unable to transfer today, anticipate total A+2                 Vision   Vision Assessment?: No apparent visual deficits Eye Alignment: Within Functional Limits Ocular Range of Motion: Within Functional  Limits Alignment/Gaze Preference: Within Defined Limits Tracking/Visual Pursuits: Able to track stimulus in all quads without difficulty     Perception Perception: Not tested       Praxis Praxis: Impaired Praxis Impairment Details: Initiation     Pertinent Vitals/Pain Pain Assessment Pain Assessment: Faces Faces Pain Scale: Hurts little more Pain Location: generalized with mobility Pain Descriptors / Indicators: Guarding, Aching, Sore, Grimacing Pain Intervention(s): Monitored during session, Limited activity within patient's tolerance, Repositioned     Extremity/Trunk Assessment Upper Extremity Assessment Upper Extremity Assessment: Generalized weakness   Lower Extremity Assessment Lower Extremity Assessment: Defer to PT evaluation       Communication Communication Communication: No apparent difficulties   Cognition Arousal: Alert Behavior During Therapy: Flat affect Cognition: History of cognitive impairments   Orientation impairments: Time, Situation (unsure of why she was admitted, thought the year was 2027)         OT - Cognition Comments: hx of dementia.                 Following commands: Impaired Following commands impaired: Follows one step commands inconsistently     Cueing  General Comments   Cueing Techniques: Gestural cues;Verbal cues  VSS. Pt c/o dizziness with sitting. Supine BP 127/72, Sit 126/98, Sit >68mins 131/72   Exercises     Shoulder Instructions      Home Living Family/patient expects to be discharged to:: Private residence Living Arrangements: Children Available Help at Discharge: Family;Available 24 hours/day Type of Home: House Home Access: Stairs to enter [employer] of Steps: 5   Home Layout: Multi-level;Bed/bath upstairs Alternate Level Stairs-Number of Steps: flight   Bathroom Shower/Tub: Producer, television/film/video: Standard Bathroom Accessibility: Yes   Home Equipment: Public affairs consultant (2 wheels);Cane - single point   Additional Comments: pt not a reliable historian, information obtained from past charts.      Prior Functioning/Environment Prior Level of Function : Independent/Modified Independent;Patient poor historian/Family not available             Mobility Comments: mod I with RW ADLs Comments: Pt reports family assist with ADLs/iADLs, prior notes indicate ind with ADls    OT Problem List: Impaired balance (sitting and/or standing);Decreased cognition;Decreased strength;Pain   OT Treatment/Interventions: Self-care/ADL training;Patient/family education;Therapeutic exercise;Balance training;Therapeutic activities;DME and/or AE instruction      OT Goals(Current goals can be found in the care plan section)   Acute Rehab OT Goals Patient Stated Goal: none stated OT Goal Formulation: Patient unable to participate in goal setting Time For Goal Achievement: 07/26/24 Potential to Achieve Goals: Fair ADL Goals Pt Will Perform Upper Body Dressing: with set-up;sitting Pt Will Perform Lower Body Dressing: sitting/lateral leans;with min assist Additional ADL Goal #1: Pt will complete bed mobility with min A as a precursor to seated ADLs Additional ADL Goal #2: Pt will complete an OOB asssessment with therapy in prep for transfers   OT Frequency:  Min 2X/week    Co-evaluation              AM-PAC OT 6 Clicks Daily Activity     Outcome Measure Help from another person eating meals?: A Little Help from another person taking care of personal grooming?: A  Little Help from another person toileting, which includes using toliet, bedpan, or urinal?: Total Help from another person bathing (including washing, rinsing, drying)?: A Lot Help from another person to put on and taking off regular upper body clothing?: A Lot Help from another person to put on and taking off regular lower body clothing?: A Lot 6 Click Score: 13   End of Session Equipment  Utilized During Treatment: Gait belt;Rolling walker (2 wheels) Nurse Communication: Mobility status  Activity Tolerance: Patient tolerated treatment well Patient left: in bed;with call bell/phone within reach;with bed alarm set  OT Visit Diagnosis: Unsteadiness on feet (R26.81);Other abnormalities of gait and mobility (R26.89);Muscle weakness (generalized) (M62.81);Pain Pain - part of body:  (generalized)                Time: 9179-9153 OT Time Calculation (min): 26 min Charges:  OT General Charges $OT Visit: 1 Visit OT Evaluation $OT Eval Moderate Complexity: 1 Mod OT Treatments $Therapeutic Activity: 8-22 mins  07/12/2024  AB, OTR/L  Acute Rehabilitation Services  Office: [phone]   Curtistine JONETTA Das 07/12/2024, 10:53 AM

## 2024-07-12 NOTE — Progress Notes (Signed)
 PROGRESS NOTE    Jacqueline Ball  FMW:[id number] DOB: 27-Oct-1941 DOA: 07/09/2024 PCP: Haze Kingfisher, MD  Outpatient Specialists:     Brief Narrative:  Patient is an 83 year old female past medical history significant for paroxysmal atrial fibrillation on Eliquis , hyperlipidemia, hypertension, type 2 diabetes mellitus with last A1c of 11.5%, dementia and anxiety.  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  On presentation, blood pressure was 80/40 mmHg, associated nausea vomiting and syncope.  Blood pressure has improved.  Leukocytosis has resolved.  Patient is volume depleted.  07/12/2024: Patient seen.  Patient looks much better today.  Will discontinue vancomycin .  Temperature 101.2 F noted around 12 midnight.    Assessment & Plan:   Principal Problem:   Syncope and collapse   Syncope and collapse: -Likely secondary to volume depletion, hypotension and likely UTI. - Continue to monitor blood pressure closely. - Negative CT. - No further syncope reported. - Hypotension has resolved. [id number]: PT OT input.  UTI/sepsis/acute emphysematous cholecystitis secondary to Klebsiella: -Patient is on cefepime .  Will discontinue vancomycin .  - Negative cultures to date.  Hypotension: - Resolved.  Volume depletion: - Improved significantly. - IV normal saline. - Continue to monitor closely. - Echo result noted.  Type 2 diabetes mellitus: - Last A1c of 11.5%. - Uncontrolled. - Continue to optimize.  Alzheimer's dementia: - No behavioral problems.  Persistent atrial fibrillation: - Restart Eliquis .    Anemia: - Hemoglobin of 10.5 g/dL. - MCV of 96.1. - Likely multifactorial. - Check iron and B12 level.   DVT prophylaxis: SCD. Code Status: Full code. Family Communication:  Disposition Plan: Inpatient.   Consultants:  Neurology.  Procedures:   None.  Antimicrobials:  IV vancomycin . IV cefepime .   Subjective: No significant history from patient.  Objective: Vitals:   07/12/24 0454 07/12/24 0735 07/12/24 1159 07/12/24 1533  BP:  (!) 117/58 110/73 136/80  Pulse:  81 85 80  Resp:  16 18   Temp:  97.9 F (36.6 C) 98.2 F (36.8 C) 98.6 F (37 C)  TempSrc:  Oral Oral Oral  SpO2:  96% 99% 98%  Weight: 54.7 kg     Height:        Intake/Output Summary (Last 24 hours) at 07/12/2024 1852 Last data filed at 07/12/2024 1800 Gross per 24 hour  Intake 3266.97 ml  Output 1650 ml  Net 1616.97 ml   Filed Weights   07/10/24 0500 07/11/24 0500 07/12/24 0454  Weight: 54.1 kg 55.5 kg 54.7 kg    Examination:  General exam: Appears calm and comfortable.  Dry buccal mucosa.  Patient is pale. Respiratory system: Clear to auscultation.  Cardiovascular system: S1 & S2  Gastrointestinal system: Abdomen is soft and nontender. Central nervous system: Awake and alert.  Extremities: No leg edema.  Data Reviewed: I have personally reviewed following labs and imaging studies  CBC: Recent Labs  Lab 07/06/24 0246 07/09/24 1049 07/10/24 0308 07/11/24 0311  WBC 7.6 14.8* 10.2 13.0*  NEUTROABS 4.8  --   --  9.8*  HGB 10.2* 11.9* 10.5* 11.5*  HCT 30.3* 38.4 31.8* 34.5*  MCV 93.5 102.4* 96.1 95.0  PLT 205 271 269 265   Basic Metabolic Panel: Recent Labs  Lab 07/06/24 0246 07/09/24 1049 07/10/24 0308 07/11/24 0311  NA 137 139 135 136  K 4.0 3.6 4.5 3.6  CL 105 103 100 100  CO2 23 22 23 22   GLUCOSE 368*  133* 269* 165*  BUN 12 17 17 9   CREATININE 0.84 1.01* 0.97 0.75  CALCIUM  8.6* 9.6 9.3 9.3  MG  --   --   --  1.7  PHOS  --   --   --  2.7   GFR: Estimated Creatinine Clearance: 46.8 mL/min (by C-G formula based on SCr of 0.75 mg/dL). Liver Function Tests: Recent Labs  Lab 07/09/24 1049 07/10/24 0308 07/11/24 0311  AST 47* 30  --   ALT 68* 50*  --   ALKPHOS 100 83  --   BILITOT 0.6 0.5  --   PROT 6.7 6.0*  --    ALBUMIN 3.3* 2.9* 3.0*   Recent Labs  Lab 07/09/24 1049  LIPASE 42   Recent Labs  Lab 07/09/24 1050  AMMONIA 16   Coagulation Profile: Recent Labs  Lab 07/09/24 2322  INR 1.1   Cardiac Enzymes: Recent Labs  Lab 07/09/24 2322  CKTOTAL 46   BNP (last 3 results) No results for input(s): PROBNP in the last 8760 hours. HbA1C: No results for input(s): HGBA1C in the last 72 hours. CBG: Recent Labs  Lab 07/11/24 1650 07/11/24 2103 07/12/24 0609 07/12/24 1234 07/12/24 1653  GLUCAP 251* 179* 189* 311* 241*   Lipid Profile: No results for input(s): CHOL, HDL, LDLCALC, TRIG, CHOLHDL, LDLDIRECT in the last 72 hours. Thyroid  Function Tests: Recent Labs    07/09/24 2322  FREET4 0.70   Anemia Panel: Recent Labs    07/09/24 2322  VITAMINB12 522   Urine analysis:    Component Value Date/Time   COLORURINE YELLOW 07/09/2024 1045   APPEARANCEUR CLOUDY (A) 07/09/2024 1045   LABSPEC 1.016 07/09/2024 1045   PHURINE 5.0 07/09/2024 1045   GLUCOSEU NEGATIVE 07/09/2024 1045   HGBUR LARGE (A) 07/09/2024 1045   BILIRUBINUR NEGATIVE 07/09/2024 1045   BILIRUBINUR negative 07/16/2023 1939   KETONESUR NEGATIVE 07/09/2024 1045   PROTEINUR 30 (A) 07/09/2024 1045   UROBILINOGEN 0.2 07/16/2023 1939   UROBILINOGEN 0.2 04/02/2020 1041   NITRITE NEGATIVE 07/09/2024 1045   LEUKOCYTESUR MODERATE (A) 07/09/2024 1045   Sepsis Labs: @LABRCNTIP (procalcitonin:4,lacticidven:4)  ) Recent Results (from the past 240 hours)  Urine Culture     Status: Abnormal   Collection Time: 07/03/24  4:05 PM   Specimen: Urine, Random  Result Value Ref Range Status   Specimen Description URINE, RANDOM  Final   Special Requests   Final    NONE Reflexed from [id number] Performed at [facility], [address], [place], [place] [address]    Culture >=100,000 COLONIES/mL KLEBSIELLA PNEUMONIAE (A)  Final   Report Status 07/07/2024 FINAL  Final   Organism ID, Bacteria KLEBSIELLA  PNEUMONIAE (A)  Final      Susceptibility   Klebsiella pneumoniae - MIC*    AMPICILLIN  RESISTANT Resistant     CEFAZOLIN <=4 SENSITIVE Sensitive     CEFEPIME  <=0.12 SENSITIVE Sensitive     CEFTRIAXONE  <=0.25 SENSITIVE Sensitive     CIPROFLOXACIN <=0.25 SENSITIVE Sensitive     GENTAMICIN <=1 SENSITIVE Sensitive     IMIPENEM <=0.25 SENSITIVE Sensitive     NITROFURANTOIN <=16 SENSITIVE Sensitive     TRIMETH /SULFA  <=20 SENSITIVE Sensitive     AMPICILLIN /SULBACTAM <=2 SENSITIVE Sensitive     PIP/TAZO <=4 SENSITIVE Sensitive ug/mL    * >=100,000 COLONIES/mL KLEBSIELLA PNEUMONIAE  Blood culture (routine x 2)     Status: None   Collection Time: 07/03/24  5:50 PM   Specimen: BLOOD  Result Value Ref Range  Status   Specimen Description BLOOD SITE NOT SPECIFIED  Final   Special Requests   Final    BOTTLES DRAWN AEROBIC AND ANAEROBIC Blood Culture results may not be optimal due to an inadequate volume of blood received in culture bottles   Culture   Final    NO GROWTH 5 DAYS Performed at [facility], [address] [place], [place] [address]    Report Status 07/08/2024 FINAL  Final  MRSA Next Gen by PCR, Nasal     Status: None   Collection Time: 07/03/24 11:31 PM   Specimen: Nasal Mucosa; Nasal Swab  Result Value Ref Range Status   MRSA by PCR Next Gen NOT DETECTED NOT DETECTED Final    Comment: (NOTE) The GeneXpert MRSA Assay (FDA approved for NASAL specimens only), is one component of a comprehensive MRSA colonization surveillance program. It is not intended to diagnose MRSA infection nor to guide or monitor treatment for MRSA infections. Test performance is not FDA approved in patients less than 74 years old. Performed at [facility], [address] [place], [place] [address]   Blood culture (routine x 2)     Status: None   Collection Time: 07/03/24 11:49 PM   Specimen: BLOOD LEFT ARM  Result Value Ref Range Status   Specimen Description BLOOD LEFT ARM  Final    Special Requests   Final    BOTTLES DRAWN AEROBIC AND ANAEROBIC Blood Culture adequate volume   Culture   Final    NO GROWTH 5 DAYS Performed at [facility], [address], [place], [place] [address]    Report Status 07/09/2024 FINAL  Final  Blood culture (routine x 2)     Status: None (Preliminary result)   Collection Time: 07/09/24 10:40 AM   Specimen: BLOOD LEFT HAND  Result Value Ref Range Status   Specimen Description BLOOD LEFT HAND  Final   Special Requests   Final    BOTTLES DRAWN AEROBIC ONLY Blood Culture results may not be optimal due to an inadequate volume of blood received in culture bottles   Culture   Final    NO GROWTH 3 DAYS Performed at [facility], [address], [place], [place] [address]    Report Status PENDING  Incomplete  Resp panel by RT-PCR (RSV, Flu A&B, Covid) Anterior Nasal Swab     Status: None   Collection Time: 07/09/24 10:42 AM   Specimen: Anterior Nasal Swab  Result Value Ref Range Status   SARS Coronavirus 2 by RT PCR NEGATIVE NEGATIVE Final   Influenza A by PCR NEGATIVE NEGATIVE Final   Influenza B by PCR NEGATIVE NEGATIVE Final    Comment: (NOTE) The Xpert Xpress SARS-CoV-2/FLU/RSV plus assay is intended as an aid in the diagnosis of influenza from Nasopharyngeal swab specimens and should not be used as a sole basis for treatment. Nasal washings and aspirates are unacceptable for Xpert Xpress SARS-CoV-2/FLU/RSV testing.  Fact Sheet for Patients: BloggerCourse.com  Fact Sheet for Healthcare Providers: SeriousBroker.it  This test is not yet approved or cleared by the [place]  FDA and has been authorized for detection and/or diagnosis of SARS-CoV-2 by FDA under an Emergency Use Authorization (EUA). This EUA will remain in effect (meaning this test can be used) for the duration of the COVID-19 declaration under Section 564(b)(1) of the Act, 21 U.S.C. section  360bbb-3(b)(1), unless the authorization is terminated or revoked.     Resp Syncytial Virus by PCR  NEGATIVE NEGATIVE Final    Comment: (NOTE) Fact Sheet for Patients: BloggerCourse.com  Fact Sheet for Healthcare Providers: SeriousBroker.it  This test is not yet approved or cleared by the [place]  FDA and has been authorized for detection and/or diagnosis of SARS-CoV-2 by FDA under an Emergency Use Authorization (EUA). This EUA will remain in effect (meaning this test can be used) for the duration of the COVID-19 declaration under Section 564(b)(1) of the Act, 21 U.S.C. section 360bbb-3(b)(1), unless the authorization is terminated or revoked.  Performed at [facility], [address] [place], [place] [address]   Blood culture (routine x 2)     Status: None (Preliminary result)   Collection Time: 07/09/24 10:45 AM   Specimen: BLOOD LEFT HAND  Result Value Ref Range Status   Specimen Description BLOOD LEFT HAND  Final   Special Requests   Final    BOTTLES DRAWN AEROBIC ONLY Blood Culture results may not be optimal due to an inadequate volume of blood received in culture bottles   Culture   Final    NO GROWTH 3 DAYS Performed at [facility], [address] [place], [place] [address]    Report Status PENDING  Incomplete  Culture, OB Urine     Status: None   Collection Time: 07/10/24  6:10 PM   Specimen: Urine, Random  Result Value Ref Range Status   Specimen Description URINE, RANDOM  Final   Special Requests NONE  Final   Culture   Final    NO GROWTH NO GROUP B STREP (S.AGALACTIAE) ISOLATED Performed at [facility] Lab, [address], [place], [place] [address]    Report Status 07/11/2024 FINAL  Final         Radiology Studies: No results found.       Scheduled Meds:  apixaban   2.5 mg Oral BID   Chlorhexidine  Gluconate Cloth  6 each Topical Daily   insulin  aspart  0-5 Units  Subcutaneous QHS   insulin  aspart  0-9 Units Subcutaneous TID WC   Continuous Infusions:  sodium chloride  75 mL/hr at 07/12/24 1412   ceFEPime  (MAXIPIME ) IV 2 g (07/12/24 0852)     LOS: 3 days    Time spent: 35 minutes    Leatrice Chapel, MD  [facility] Pager #: [phone] 7PM-7AM contact night coverage as above

## 2024-07-12 NOTE — Evaluation (Signed)
 Physical Therapy Evaluation Patient Details Name: Jacqueline Ball MRN: [id number] DOB: 1941/11/10 Today's Date: 07/12/2024  History of Present Illness  Pt is an 83 y.o. female who presented 07/09/24 with syncope episode and nausea and vomiting. Pt recently admitted on 07/03/2024 and discharged on 07/07/2024 for emphysematous cystitis in the setting of Klebsiella UTI. Syncope suspected to be due to volume depletion, hypotension and likely UTI. PMH: dementia, paroxysmal afib, HLD, HTN, DM2, anxiety   Clinical Impression  Pt presents with condition above and deficits mentioned below, see PT Problem List. Per son report: PTA, she was independent without DME, living with her son in a 2-level house with 4 STE. She can stay on the main level if needed. Her son can provide 24/7 care at d/c. Currently, the pt displays deficits in cognition (baseline dementia), generalized strength (L leg mildly weaker than R), balance, power, and endurance. She is limited by back and neck pain this date. She is currently requiring maxA for bed mobility and sit <> stand transfers. She was unable to take any steps this date. She is at high risk for falls and injury. At this time, she could benefit from short-term inpatient rehab, < 3 hours/day, prior to d/c home to maximize her return to baseline and decrease caregiver burden. If her pain and activity tolerance improve to a point she could tolerate 3 hours of therapy per day, then it may be beneficial to consider more intensive inpatient rehab options as she was very independent PTA. Will continue to follow acutely.      If plan is discharge home, recommend the following: Two people to help with walking and/or transfers;Two people to help with bathing/dressing/bathroom;Assistance with cooking/housework;Direct supervision/assist for medications management;Direct supervision/assist for financial management;Assist for transportation;Help with stairs or ramp for entrance;Supervision  due to cognitive status   Can travel by private vehicle   No    Equipment Recommendations BSC/3in1;Wheelchair (measurements PT);Wheelchair cushion (measurements PT);Hospital bed;Hoyer lift (pending progress)  Recommendations for Other Services       Functional Status Assessment Patient has had a recent decline in their functional status and demonstrates the ability to make significant improvements in function in a reasonable and predictable amount of time.     Precautions / Restrictions Precautions Precautions: Fall Recall of Precautions/Restrictions: Impaired Precaution/Restrictions Comments: watch BP Restrictions Weight Bearing Restrictions Per Provider Order: No      Mobility  Bed Mobility Overal bed mobility: Needs Assistance Bed Mobility: Supine to Sit, Sit to Supine     Supine to sit: Max assist, HOB elevated, Used rails Sit to supine: Max assist   General bed mobility comments: Pt needed step-by-step cuing to sequence bed mobility. Pt tried to initiate sliding legs to R EOB when verbally cued, but ultimately needed assistance to complete. Pt able to grab bed rail with R UE and therapist with L to pull up, but demonstrated poor initiation and stiffness with trunk flexion, needing maxA to sit up R EOB. Pt able to lean laterally to R elbow but needed maxA to lift legs and direct trunk to midline of bed with return to supine.    Transfers Overall transfer level: Needs assistance Equipment used: 1 person hand held assist, Rolling walker (2 wheels) Transfers: Sit to/from Stand Sit to Stand: Max assist           General transfer comment: Attempted the first rep with RW but poor initiation noted. Thus, altered approach to HHA with PT providing anterior support with bil knee block and  using bed pad as sling to extend hips. Pt pulled with arms but displayed poor initiation through legs, needing maxA to come to a full stand x3 reps from EOB. Pt still maintained a flexed  posture upon standing up though, demonstrating poor extension of hips and knees    Ambulation/Gait             Pre-gait activities: Lateral weight shifting in standing with bil knees blocked, bil HHA, and maxA for balance General Gait Details: unable despite cues and attempts and Armed forces training and education officer     Tilt Bed    Modified Rankin (Stroke Patients Only) Modified Rankin (Stroke Patients Only) Pre-Morbid Rankin Score: Moderate disability Modified Rankin: Severe disability     Balance Overall balance assessment: Needs assistance Sitting-balance support: Feet supported, Bilateral upper extremity supported, No upper extremity supported Sitting balance-Leahy Scale: Fair Sitting balance - Comments: Progressed from needing UE support and minA for balance to CGA with hands in lap after cuing pt to correct her posterior lean Postural control: Posterior lean Standing balance support: Bilateral upper extremity supported, During functional activity, Reliant on assistive device for balance Standing balance-Leahy Scale: Poor Standing balance comment: MaxA to stand statically with bil knees blocked and bil HHA, pt maintains a flexed posture at hips and knees                             Pertinent Vitals/Pain Pain Assessment Pain Assessment: Faces Faces Pain Scale: Hurts even more Pain Location: back and neck Pain Descriptors / Indicators: Discomfort, Grimacing, Guarding, Moaning Pain Intervention(s): Monitored during session, Limited activity within patient's tolerance, Repositioned, Heat applied, Patient requesting pain meds-RN notified    Home Living Family/patient expects to be discharged to:: Private residence Living Arrangements: Children (son) Available Help at Discharge: Family;Available 24 hours/day Type of Home: House Home Access: Stairs to enter Entrance Stairs-Rails: Right;Left Entrance Stairs-Number of Steps: 4 Alternate Level  Stairs-Number of Steps: flight Home Layout: Two level;Able to live on main level with bedroom/bathroom Home Equipment: Rolling Walker (2 wheels);Cane - single point      Prior Function Prior Level of Function : Needs assist             Mobility Comments: Independent without AD ADLs Comments: Son assists with meds and son does the cooking, cleaning, and driving.     Extremity/Trunk Assessment   Upper Extremity Assessment Upper Extremity Assessment: Defer to OT evaluation    Lower Extremity Assessment Lower Extremity Assessment: RLE deficits/detail;LLE deficits/detail;Generalized weakness RLE Deficits / Details: MMT scores of 3- knee extension, 3+ ankle dorsiflexion LLE Deficits / Details: MMT scores of 2+ knee extension, 3- ankle dorsiflexion    Cervical / Trunk Assessment Cervical / Trunk Assessment: Kyphotic  Communication   Communication Communication: No apparent difficulties    Cognition Arousal: Alert Behavior During Therapy: Flat affect   PT - Cognitive impairments: History of cognitive impairments, Initiation, Sequencing, Problem solving, Attention                       PT - Cognition Comments: Pt has dementia. Pt displayed poor initiation, problem-solving, and sequencing with mobility and needed repeated cues to remain on task. Pt needed step-by-step multi-modal cues to sequence mobility Following commands: Impaired Following commands impaired: Follows one step commands inconsistently, Follows one step commands with increased time     Cueing  Cueing Techniques: Verbal cues, Tactile cues, Gestural cues     General Comments General comments (skin integrity, edema, etc.): Orthostatics - 136/80 (95) & 79 bpm supine, 132/98 (104) & 89 bpm sitting, 124/80 (95) & 83 bpm sitting after standing    Exercises General Exercises - Lower Extremity Ankle Circles/Pumps: AROM, Both, Other reps (comment), Supine (x8 reps) Heel Slides: AAROM, Left, 5 reps, Supine    Assessment/Plan    PT Assessment Patient needs continued PT services  PT Problem List Decreased strength;Decreased balance;Decreased activity tolerance;Decreased mobility;Decreased range of motion;Decreased cognition;Pain       PT Treatment Interventions DME instruction;Gait training;Stair training;Functional mobility training;Therapeutic activities;Therapeutic exercise;Balance training;Neuromuscular re-education;Patient/family education;Cognitive remediation;Wheelchair mobility training    PT Goals (Current goals can be found in the Care Plan section)  Acute Rehab PT Goals Patient Stated Goal: to improve PT Goal Formulation: With patient/family Time For Goal Achievement: 07/26/24 Potential to Achieve Goals: Good    Frequency Min 2X/week     Co-evaluation               AM-PAC PT 6 Clicks Mobility  Outcome Measure Help needed turning from your back to your side while in a flat bed without using bedrails?: A Lot Help needed moving from lying on your back to sitting on the side of a flat bed without using bedrails?: A Lot Help needed moving to and from a bed to a chair (including a wheelchair)?: A Lot Help needed standing up from a chair using your arms (e.g., wheelchair or bedside chair)?: A Lot Help needed to walk in hospital room?: Total Help needed climbing 3-5 steps with a railing? : Total 6 Click Score: 10    End of Session Equipment Utilized During Treatment: Gait belt Activity Tolerance: Patient limited by pain Patient left: in bed;with call bell/phone within reach;with bed alarm set;with family/visitor present Nurse Communication: Mobility status;Patient requests pain meds PT Visit Diagnosis: Unsteadiness on feet (R26.81);Other abnormalities of gait and mobility (R26.89);Muscle weakness (generalized) (M62.81);Difficulty in walking, not elsewhere classified (R26.2);Pain Pain - Right/Left:  (back and neck) Pain - part of body:  (back and neck)    Time:  8467-8391 PT Time Calculation (min) (ACUTE ONLY): 36 min   Charges:   PT Evaluation $PT Eval Moderate Complexity: 1 Mod PT Treatments $Therapeutic Activity: 8-22 mins PT General Charges $$ ACUTE PT VISIT: 1 Visit         Theo Ferretti, PT, DPT Acute Rehabilitation Services  Office: [phone]   Theo CHRISTELLA Ferretti 07/12/2024, 4:31 PM

## 2024-07-13 LAB — GLUCOSE, CAPILLARY
Glucose-Capillary: 187 mg/dL — ABNORMAL HIGH (ref 70–99)
Glucose-Capillary: 265 mg/dL — ABNORMAL HIGH (ref 70–99)
Glucose-Capillary: 288 mg/dL — ABNORMAL HIGH (ref 70–99)
Glucose-Capillary: 172 mg/dL — ABNORMAL HIGH (ref 70–99)

## 2024-07-13 MED ORDER — SODIUM CHLORIDE 0.9 % IV SOLN
2.0000 g | INTRAVENOUS | Status: DC
  Administered 2024-07-13 – 2024-07-14 (×2): 2 g via INTRAVENOUS
  Filled 2024-07-13 (×2): qty 20

## 2024-07-13 NOTE — Care Management Important Message (Signed)
 Important Message  Patient Details  Name: Jacqueline Ball MRN: [id number] Date of Birth: [birth year]   Important Message Given:        Glade Cuff 07/13/2024, 3:46 PM

## 2024-07-13 NOTE — Progress Notes (Signed)
 PROGRESS NOTE    Jacqueline Ball  FMW:[id number] DOB: 01-29-1941 DOA: 07/09/2024 PCP: Haze Kingfisher, MD  Outpatient Specialists:     Brief Narrative:  Patient is an 83 year old female past medical history significant for paroxysmal atrial fibrillation on Eliquis , hyperlipidemia, hypertension, type 2 diabetes mellitus with last A1c of 11.5%, dementia and anxiety.  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  On presentation, blood pressure was 80/40 mmHg, with associated nausea vomiting and syncope.  Blood pressure has improved.  Leukocytosis has resolved.  Patient seen volume depleted.  Urine and blood cultures have been negative.  Elevated CRP (4.4).  CT head was nonrevealing.  MRI head was nonrevealing.  CT abdomen and pelvis revealed resolution of emphysematous UTI, multiple left renal cysts with trace perinephric fluid and sigmoid diverticulosis.  CT angiography head and neck with and without contrast did not reveal any acute process.  07/13/2024: Patient seen alongside care.  Patient's son updated over the phone.  Altered mentation has resolved.  Patient has improved significantly.  No documented fever in the last 24 hours.  Blood pressure is stable.  P.o. intake is improving.  Loose stools reported.  IV cefepime  and vancomycin  has been narrowed to IV Rocephin .  Completed IV antibiotics.  Pursue disposition (SNF).    Assessment & Plan:   Principal Problem:   Syncope and collapse   Syncope and collapse: -Likely secondary to volume depletion, hypotension and likely UTI. - Continue to monitor blood pressure closely. - Negative CT. - No further syncope reported. - Hypotension has resolved. [id number]: PT OT input.  UTI/sepsis/acute emphysematous cholecystitis secondary to Klebsiella: -Patient was on cefepime  and vancomycin  initially.  Both antibiotics have been  discontinued. - Cultures have not grown any organisms. - Negative chest x-ray. - Patient is now on on IV Rocephin .  Complete course of antibiotics. -Pursue disposition (SNF)  Hypotension: - Resolved.  Volume depletion: - Improved significantly. - IV normal saline has been discontinued. - Continue to monitor closely. - Echo result noted.  Type 2 diabetes mellitus: - Last A1c of 11.5%. - Uncontrolled. - Continue to optimize.  Alzheimer's dementia: - No behavioral problems.  Persistent atrial fibrillation: - Restart Eliquis .    Anemia: - Hemoglobin of 10.5 g/dL. - MCV of 96.1. - Likely multifactorial. - Check iron and B12 level.   DVT prophylaxis: SCD. Code Status: Full code. Family Communication:  Disposition Plan: Inpatient.   Consultants:  Neurology.  Procedures:  None.  Antimicrobials:  IV vancomycin  discontinued. IV cefepime  discontinued. Started IV Rocephin  today, Monday, 07/13/2024   Subjective: Loose stools x 1 episode reported.  Objective: Vitals:   07/13/24 0500 07/13/24 0731 07/13/24 1117 07/13/24 1552  BP:  132/88 124/71 (!) 150/87  Pulse:  91 90 68  Resp:  16 16 19   Temp:  98.7 F (37.1 C) 98.3 F (36.8 C) 98.5 F (36.9 C)  TempSrc:  Oral Oral Oral  SpO2:  98% 98% 96%  Weight: 55 kg     Height:        Intake/Output Summary (Last 24 hours) at 07/13/2024 1802 Last data filed at 07/13/2024 0011 Gross per 24 hour  Intake 240 ml  Output 900 ml  Net -660 ml   Filed Weights   07/11/24 0500 07/12/24 0454 07/13/24 0500  Weight: 55.5 kg 54.7 kg 55 kg    Examination:  General exam: Appears calm and comfortable.  Awake and alert. Respiratory system: Clear  to auscultation.  Cardiovascular system: S1 & S2  Gastrointestinal system: Abdomen is soft and nontender. Central nervous system: Awake and alert.  Extremities: No leg edema.  Data Reviewed: I have personally reviewed following labs and imaging studies  CBC: Recent Labs  Lab  07/09/24 1049 07/10/24 0308 07/11/24 0311  WBC 14.8* 10.2 13.0*  NEUTROABS  --   --  9.8*  HGB 11.9* 10.5* 11.5*  HCT 38.4 31.8* 34.5*  MCV 102.4* 96.1 95.0  PLT 271 269 265   Basic Metabolic Panel: Recent Labs  Lab 07/09/24 1049 07/10/24 0308 07/11/24 0311  NA 139 135 136  K 3.6 4.5 3.6  CL 103 100 100  CO2 22 23 22   GLUCOSE 133* 269* 165*  BUN 17 17 9   CREATININE 1.01* 0.97 0.75  CALCIUM  9.6 9.3 9.3  MG  --   --  1.7  PHOS  --   --  2.7   GFR: Estimated Creatinine Clearance: 47.1 mL/min (by C-G formula based on SCr of 0.75 mg/dL). Liver Function Tests: Recent Labs  Lab 07/09/24 1049 07/10/24 0308 07/11/24 0311  AST 47* 30  --   ALT 68* 50*  --   ALKPHOS 100 83  --   BILITOT 0.6 0.5  --   PROT 6.7 6.0*  --   ALBUMIN 3.3* 2.9* 3.0*   Recent Labs  Lab 07/09/24 1049  LIPASE 42   Recent Labs  Lab 07/09/24 1050  AMMONIA 16   Coagulation Profile: Recent Labs  Lab 07/09/24 2322  INR 1.1   Cardiac Enzymes: Recent Labs  Lab 07/09/24 2322  CKTOTAL 46   BNP (last 3 results) No results for input(s): PROBNP in the last 8760 hours. HbA1C: No results for input(s): HGBA1C in the last 72 hours. CBG: Recent Labs  Lab 07/12/24 1653 07/12/24 2106 07/13/24 0614 07/13/24 1118 07/13/24 1552  GLUCAP 241* 242* 187* 265* 288*   Lipid Profile: No results for input(s): CHOL, HDL, LDLCALC, TRIG, CHOLHDL, LDLDIRECT in the last 72 hours. Thyroid  Function Tests: No results for input(s): TSH, T4TOTAL, FREET4, T3FREE, THYROIDAB in the last 72 hours.  Anemia Panel: No results for input(s): VITAMINB12, FOLATE, FERRITIN, TIBC, IRON, RETICCTPCT in the last 72 hours.  Urine analysis:    Component Value Date/Time   COLORURINE YELLOW 07/09/2024 1045   APPEARANCEUR CLOUDY (A) 07/09/2024 1045   LABSPEC 1.016 07/09/2024 1045   PHURINE 5.0 07/09/2024 1045   GLUCOSEU NEGATIVE 07/09/2024 1045   HGBUR LARGE (A) 07/09/2024 1045    BILIRUBINUR NEGATIVE 07/09/2024 1045   BILIRUBINUR negative 07/16/2023 1939   KETONESUR NEGATIVE 07/09/2024 1045   PROTEINUR 30 (A) 07/09/2024 1045   UROBILINOGEN 0.2 07/16/2023 1939   UROBILINOGEN 0.2 04/02/2020 1041   NITRITE NEGATIVE 07/09/2024 1045   LEUKOCYTESUR MODERATE (A) 07/09/2024 1045   Sepsis Labs: @LABRCNTIP (procalcitonin:4,lacticidven:4)  ) Recent Results (from the past 240 hours)  MRSA Next Gen by PCR, Nasal     Status: None   Collection Time: 07/03/24 11:31 PM   Specimen: Nasal Mucosa; Nasal Swab  Result Value Ref Range Status   MRSA by PCR Next Gen NOT DETECTED NOT DETECTED Final    Comment: (NOTE) The GeneXpert MRSA Assay (FDA approved for NASAL specimens only), is one component of a comprehensive MRSA colonization surveillance program. It is not intended to diagnose MRSA infection nor to guide or monitor treatment for MRSA infections. Test performance is not FDA approved in patients less than 31 years old. Performed at [facility], [address] [place], [place] [address]   Blood culture (routine x 2)     Status: None   Collection Time: 07/03/24 11:49 PM   Specimen: BLOOD LEFT ARM  Result Value Ref Range Status   Specimen Description BLOOD LEFT ARM  Final   Special Requests   Final    BOTTLES DRAWN AEROBIC AND ANAEROBIC Blood Culture adequate volume   Culture   Final    NO GROWTH 5 DAYS Performed at [facility], [address] [place], [place] [address]    Report Status 07/09/2024 FINAL  Final  Blood culture (routine x 2)     Status: None (Preliminary result)   Collection Time: 07/09/24 10:40 AM   Specimen: BLOOD LEFT HAND  Result Value Ref Range Status   Specimen Description BLOOD LEFT HAND  Final   Special Requests   Final    BOTTLES DRAWN AEROBIC ONLY Blood Culture results may not be optimal due to an inadequate volume of blood received in culture bottles   Culture   Final    NO GROWTH 4 DAYS Performed at [facility],  [address], [place], [place] [address]    Report Status PENDING  Incomplete  Resp panel by RT-PCR (RSV, Flu A&B, Covid) Anterior Nasal Swab     Status: None   Collection Time: 07/09/24 10:42 AM   Specimen: Anterior Nasal Swab  Result Value Ref Range Status   SARS Coronavirus 2 by RT PCR NEGATIVE NEGATIVE Final   Influenza A by PCR NEGATIVE NEGATIVE Final   Influenza B by PCR NEGATIVE NEGATIVE Final    Comment: (NOTE) The Xpert Xpress SARS-CoV-2/FLU/RSV plus assay is intended as an aid in the diagnosis of influenza from Nasopharyngeal swab specimens and should not be used as a sole basis for treatment. Nasal washings and aspirates are unacceptable for Xpert Xpress SARS-CoV-2/FLU/RSV testing.  Fact Sheet for Patients: BloggerCourse.com  Fact Sheet for Healthcare Providers: SeriousBroker.it  This test is not yet approved or cleared by the [place]  FDA and has been authorized for detection and/or diagnosis of SARS-CoV-2 by FDA under an Emergency Use Authorization (EUA). This EUA will remain in effect (meaning this test can be used) for the duration of the COVID-19 declaration under Section 564(b)(1) of the Act, 21 U.S.C. section 360bbb-3(b)(1), unless the authorization is terminated or revoked.     Resp Syncytial Virus by PCR NEGATIVE NEGATIVE Final    Comment: (NOTE) Fact Sheet for Patients: BloggerCourse.com  Fact Sheet for Healthcare Providers: SeriousBroker.it  This test is not yet approved or cleared by the [place]  FDA and has been authorized for detection and/or diagnosis of SARS-CoV-2 by FDA under an Emergency Use Authorization (EUA). This EUA will remain in effect (meaning this test can be used) for the duration of the COVID-19 declaration under Section 564(b)(1) of the Act, 21 U.S.C. section 360bbb-3(b)(1), unless the authorization is terminated  or revoked.  Performed at [facility], [address] [place], [place] [address]   Blood culture (routine x 2)     Status: None (Preliminary result)   Collection Time: 07/09/24 10:45 AM   Specimen: BLOOD LEFT HAND  Result Value Ref Range Status   Specimen Description BLOOD LEFT HAND  Final   Special Requests   Final    BOTTLES DRAWN AEROBIC ONLY Blood Culture results may not be optimal due to an inadequate volume of blood received in culture bottles   Culture   Final    NO  GROWTH 4 DAYS Performed at [facility], [address] [place], [place] [address]    Report Status PENDING  Incomplete  Culture, OB Urine     Status: None   Collection Time: 07/10/24  6:10 PM   Specimen: Urine, Random  Result Value Ref Range Status   Specimen Description URINE, RANDOM  Final   Special Requests NONE  Final   Culture   Final    NO GROWTH NO GROUP B STREP (S.AGALACTIAE) ISOLATED Performed at [facility], [address] [place], [place] [address]    Report Status 07/11/2024 FINAL  Final         Radiology Studies: No results found.       Scheduled Meds:  apixaban   2.5 mg Oral BID   Chlorhexidine  Gluconate Cloth  6 each Topical Daily   docusate sodium   100 mg Oral BID   insulin  aspart  0-5 Units Subcutaneous QHS   insulin  aspart  0-9 Units Subcutaneous TID WC   Continuous Infusions:  sodium chloride  75 mL/hr at 07/13/24 0200   cefTRIAXone  (ROCEPHIN )  IV       LOS: 4 days    Time spent: 35 minutes    Leatrice Chapel, MD  [facility] Pager #: [phone] 7PM-7AM contact night coverage as above

## 2024-07-13 NOTE — TOC Progression Note (Addendum)
 Transition of Care [facility]) - Progression Note    Patient Details  Name: Jacqueline Ball MRN: [id number] Date of Birth: [birth year]  [facility] CM/SW Contact  Jacqueline Ball, Jacqueline Ball Phone Number: 07/13/2024, 11:12 AM  Clinical Narrative:   CSW spoke with patient's son, Jacqueline Ball, to discuss recommendation for SNF. Son would be interested in CIR if PT thinks that patient is an appropriate candidate upon seeing patient again today, but also in agreement to SNF as the patient would need rehab. CSW explained process of sending out referrals and will provide CMS choice list for review, son in agreement. CSW completed referral, will provide choice once bed offers received. CSW to follow.  UPDATE: CSW spoke with patient's son and provided bed offers. CSW to follow.    Expected Discharge Plan: Skilled Nursing Facility Barriers to Discharge: Continued Medical Work up, English as a second language teacher               Expected Discharge Plan and Services   Discharge Planning Services: CM Consult   Living arrangements for the past 2 months: Single Family Home                                       Social Drivers of Health (SDOH) Interventions SDOH Screenings   Food Insecurity: No Food Insecurity (07/09/2024)  Housing: Low Risk  (07/09/2024)  Transportation Needs: No Transportation Needs (07/09/2024)  Utilities: Not At Risk (07/09/2024)  Social Connections: Socially Isolated (07/09/2024)  Tobacco Use: Low Risk  (07/09/2024)    Readmission Risk Interventions    07/07/2024   10:33 AM  Readmission Risk Prevention Plan  Transportation Screening Complete  PCP or Specialist Appt within 5-7 Days Complete  Home Care Screening Complete  Medication Review (RN CM) Complete

## 2024-07-13 NOTE — Inpatient Diabetes Management (Signed)
 Inpatient Diabetes Program Recommendations  AACE/ADA: New Consensus Statement on Inpatient Glycemic Control (2015)  Target Ranges:  Prepandial:   less than 140 mg/dL      Peak postprandial:   less than 180 mg/dL (1-2 hours)      Critically ill patients:  140 - 180 mg/dL   Lab Results  Component Value Date   GLUCAP 187 (H) 07/13/2024   HGBA1C 11.5 (H) 07/06/2024    Review of Glycemic Control  Latest Reference Range & Units 07/11/24 16:50 07/11/24 21:03 07/12/24 06:09 07/12/24 12:34 07/12/24 16:53 07/12/24 21:06 07/13/24 06:14  Glucose-Capillary 70 - 99 mg/dL 748 (H) 820 (H) 810 (H) 311 (H) 241 (H) 242 (H) 187 (H)  (H): Data is abnormally high Diabetes history: DM2 Outpatient Diabetes medications: Metformin  750 mg daily Current orders for Inpatient glycemic control: Novolog  0-9 units TID with meals, Novolog  0-5 units at bedtime   Inpatient Diabetes Program Recommendations:    Consider adding Semglee  6 units every day.   Thanks, Tinnie Minus, MSN, RNC-OB Diabetes Coordinator [phone] (8a-5p)

## 2024-07-13 NOTE — NC FL2 (Signed)
 [place]  MEDICAID FL2 LEVEL OF CARE FORM     IDENTIFICATION  Patient Name: Jacqueline Ball Birthdate: [birth year] Sex: female Admission Date (Current Location): 07/09/2024  [facility] and [place] Number:  Producer, television/film/video and Address:  [facility], 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   AKI (acute kidney injury) (HCC) 07/03/2024   Hyponatremia 07/03/2024   Diarrhea 01/13/2024   Dizziness and giddiness 06/16/2023   Hypokalemia 05/29/2023   UTI (urinary tract infection) 05/29/2023   Malnutrition of moderate degree 05/29/2023   Hypoglycemia 05/28/2023   Shortness of breath 05/28/2023   Acute midline low back pain without sciatica 11/29/2019   GERD (gastroesophageal reflux disease) 05/12/2019   Arthritis 05/12/2019   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   Hyperlipidemia 05/12/2019   Overactive bladder 05/12/2019   Chronic anticoagulation 03/18/2018   Late onset Alzheimer's disease without behavioral disturbance (HCC) 01/21/2017   Chronic atrial fibrillation (HCC) 11/02/2015   Essential hypertension 11/02/2015   Type 2 diabetes mellitus without complication, without long-term current use of insulin  (HCC) 11/02/2015   Cognitive impairment 10/31/2015   Chest pain 11/22/2014   Muscle cramps 04/17/2012   Bilateral shoulder pain 03/09/2012   Essential hypertension 03/28/2011   Diabetes mellitus type 2, noninsulin dependent (HCC) 03/28/2011   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    Orientation RESPIRATION BLADDER Height & Weight     Self, Time, Place  Normal Incontinent Weight: 121 lb 4.1 oz (55 kg) Height:  5' 5 (165.1 cm)  BEHAVIORAL SYMPTOMS/MOOD NEUROLOGICAL BOWEL NUTRITION STATUS      Incontinent Diet (carb modified)  AMBULATORY STATUS COMMUNICATION OF NEEDS Skin   Extensive Assist Verbally Normal                       Personal Care Assistance Level of Assistance  Bathing, Feeding, Dressing Bathing Assistance: Maximum assistance Feeding assistance: Limited assistance Dressing Assistance: Maximum assistance     Functional Limitations Info  Sight Sight Info: Impaired        SPECIAL CARE FACTORS FREQUENCY  PT (By licensed PT), OT (By licensed OT)     PT Frequency: 5x/wk OT Frequency: 5x/wk            Contractures Contractures Info: Not present    Additional Factors Info  Code Status, Allergies, Insulin  Sliding Scale Code Status Info: Full Allergies Info: Metoprolol    Insulin  Sliding Scale Info: see DC summary       Current Medications (07/13/2024):  This is the current hospital active medication list Current Facility-Administered Medications  Medication Dose Route Frequency Provider Last Rate Last Admin   0.9 %  sodium chloride  infusion   Intravenous Continuous Rosario Leatrice I, MD 75 mL/hr at 07/13/24 0200 New Bag at 07/13/24 0200   acetaminophen  (TYLENOL ) tablet 650 mg  650 mg Oral Q6H PRN Patel, Ekta V, MD   650 mg at 07/13/24 0847   Or   acetaminophen  (TYLENOL ) suppository 650 mg  650  mg Rectal Q6H PRN Patel, Ekta V, MD       apixaban  (ELIQUIS ) tablet 2.5 mg  2.5 mg Oral BID Ogbata, Sylvester I, MD   2.5 mg at 07/13/24 0849   ceFEPIme  (MAXIPIME ) 2 g in sodium chloride  0.9 % 100 mL IVPB  2 g Intravenous Q12H Tobie Modest V, MD 200 mL/hr at 07/13/24 0854 2 g at 07/13/24 0854   Chlorhexidine  Gluconate Cloth 2 % PADS 6 each  6 each Topical Daily Tobie Modest GAILS, MD   6 each at 07/12/24 9147   docusate  sodium (COLACE) capsule 100 mg  100 mg Oral BID Ogbata, Sylvester I, MD   100 mg at 07/13/24 0849   insulin  aspart (novoLOG ) injection 0-5 Units  0-5 Units Subcutaneous QHS Rosario Eland I, MD   2 Units at 07/12/24 2124   insulin  aspart (novoLOG ) injection 0-9 Units  0-9 Units Subcutaneous TID WC Rosario Eland I, MD   2 Units at 07/13/24 9364   ondansetron  (ZOFRAN ) tablet 4 mg  4 mg Oral Q6H PRN Patel, Ekta V, MD   4 mg at 07/09/24 1427   Or   ondansetron  (ZOFRAN ) injection 4 mg  4 mg Intravenous Q6H PRN Patel, Ekta V, MD         Discharge Medications: Please see discharge summary for a list of discharge medications.  Relevant Imaging Results:  Relevant Lab Results:   Additional Information SS#: [id number]  Almarie CHRISTELLA Goodie, LCSW

## 2024-07-13 NOTE — Plan of Care (Signed)
   Problem: Clinical Measurements: Goal: Ability to maintain clinical measurements within normal limits will improve Outcome: Progressing   Problem: Elimination: Goal: Will not experience complications related to bowel motility Outcome: Progressing

## 2024-07-13 NOTE — Progress Notes (Signed)
 Physical Therapy Treatment Patient Details Name: Jacqueline Ball MRN: [id number] DOB: 09/21/1941 Today's Date: 07/13/2024   History of Present Illness Pt is an 83 y.o. female who presented 07/09/24 with syncope episode and nausea and vomiting. Pt recently admitted on 07/03/2024 and discharged on 07/07/2024 for emphysematous cystitis in the setting of Klebsiella UTI. Syncope suspected to be due to volume depletion, hypotension and likely UTI. PMH: dementia, paroxysmal afib, HLD, HTN, DM2, anxiety    PT Comments  Pt agreeable to participate, but not making substantial progress towards physical therapy goals. Pt requiring up to two person maximal assist for bed mobility and stand pivot transfers. Pt with poor coordination and utilization of RW (does not use at baseline), but also cannot achieve upright posture without external support. Patient will benefit from continued inpatient follow up therapy, <3 hours/day to address deficits, maximize functional mobility and decrease caregiver burden.    If plan is discharge home, recommend the following: Two people to help with walking and/or transfers;Two people to help with bathing/dressing/bathroom;Assistance with cooking/housework;Direct supervision/assist for medications management;Direct supervision/assist for financial management;Assist for transportation;Help with stairs or ramp for entrance;Supervision due to cognitive status   Can travel by private vehicle     No  Equipment Recommendations  BSC/3in1;Wheelchair (measurements PT);Wheelchair cushion (measurements PT);Hospital bed;Hoyer lift (pending progress)    Recommendations for Other Services       Precautions / Restrictions Precautions Precautions: Fall Recall of Precautions/Restrictions: Impaired Restrictions Weight Bearing Restrictions Per Provider Order: No     Mobility  Bed Mobility Overal bed mobility: Needs Assistance Bed Mobility: Supine to Sit, Sit to Supine,  Rolling Rolling: Max assist   Supine to sit: Max assist, HOB elevated, Used rails     General bed mobility comments: Increased time to initiate movement, very guarded and stiff. Rolling to R/L for peri care. Pt able to initiate bringing BLE's to edge of bed, assist to fully execute and handheld guidance to elevate trunk to upright    Transfers Overall transfer level: Needs assistance Equipment used: 1 person hand held assist, Rolling walker (2 wheels) Transfers: Sit to/from Stand Sit to Stand: Max assist, +2 physical assistance           General transfer comment: Pt pulling up on RW with anterior foot block, pivoting to L with poor coordination of walker (lifting up rather than down). Performed an additional 2 stands from chair, one with HHA, where pt was only able to partially stand and another with RW with max cues for keeping feet underneath her and upright posture    Ambulation/Gait               General Gait Details: unable   Stairs             Wheelchair Mobility     Tilt Bed    Modified Rankin (Stroke Patients Only) Modified Rankin (Stroke Patients Only) Pre-Morbid Rankin Score: Moderate disability Modified Rankin: Severe disability     Balance Overall balance assessment: Needs assistance Sitting-balance support: Feet supported, Bilateral upper extremity supported, No upper extremity supported Sitting balance-Leahy Scale: Fair Sitting balance - Comments: Progressed from needing UE support and minA for balance to CGA with hands in lap after cuing pt to correct her posterior lean Postural control: Posterior lean Standing balance support: Bilateral upper extremity supported, During functional activity, Reliant on assistive device for balance Standing balance-Leahy Scale: Poor Standing balance comment: MaxA to stand statically with bil knees blocked and bil HHA, pt maintains a flexed  posture at hips and knees                             Communication Communication Communication: No apparent difficulties  Cognition Arousal: Alert Behavior During Therapy: Flat affect   PT - Cognitive impairments: History of cognitive impairments, Initiation, Sequencing, Problem solving, Attention                       PT - Cognition Comments: Pt has dementia. Pt displayed poor initiation, problem-solving, and sequencing with mobility and needed repeated cues to remain on task. Pt needed step-by-step multi-modal cues to sequence mobility Following commands: Impaired Following commands impaired: Follows one step commands inconsistently, Follows one step commands with increased time    Cueing Cueing Techniques: Verbal cues, Tactile cues, Gestural cues  Exercises General Exercises - Lower Extremity Long Arc Quad: Both, 5 reps, Seated Hip Flexion/Marching: Both, 5 reps, Seated    General Comments        Pertinent Vitals/Pain Pain Assessment Pain Assessment: Faces Faces Pain Scale: Hurts whole lot Pain Location: back, neck, catheter site Pain Descriptors / Indicators: Discomfort, Grimacing, Guarding, Moaning Pain Intervention(s): Monitored during session, Limited activity within patient's tolerance, Repositioned    Home Living                          Prior Function            PT Goals (current goals can now be found in the care plan section) Acute Rehab PT Goals Patient Stated Goal: to improve PT Goal Formulation: With patient/family Time For Goal Achievement: 07/26/24 Potential to Achieve Goals: Fair Progress towards PT goals: Not progressing toward goals - comment    Frequency    Min 2X/week      PT Plan      Co-evaluation              AM-PAC PT 6 Clicks Mobility   Outcome Measure  Help needed turning from your back to your side while in a flat bed without using bedrails?: A Lot Help needed moving from lying on your back to sitting on the side of a flat bed without using bedrails?:  A Lot Help needed moving to and from a bed to a chair (including a wheelchair)?: A Lot Help needed standing up from a chair using your arms (e.g., wheelchair or bedside chair)?: A Lot Help needed to walk in hospital room?: Total Help needed climbing 3-5 steps with a railing? : Total 6 Click Score: 10    End of Session Equipment Utilized During Treatment: Gait belt Activity Tolerance: Patient limited by pain Patient left: with call bell/phone within reach;in chair;with chair alarm set Nurse Communication: Mobility status;Patient requests pain meds PT Visit Diagnosis: Unsteadiness on feet (R26.81);Other abnormalities of gait and mobility (R26.89);Muscle weakness (generalized) (M62.81);Difficulty in walking, not elsewhere classified (R26.2);Pain Pain - Right/Left:  (back and neck) Pain - part of body:  (back and neck)     Time: 1203-1230 PT Time Calculation (min) (ACUTE ONLY): 27 min  Charges:    $Therapeutic Activity: 23-37 mins PT General Charges $$ ACUTE PT VISIT: 1 Visit                     Aleck Ball, PT, DPT Acute Rehabilitation Services Office [phone]    Jacqueline Ball 07/13/2024, 1:32 PM

## 2024-07-13 NOTE — Progress Notes (Signed)
 Ok to optimize cefepime  to ceftriaxone  to complete 7d per Dr Rosario.  Sergio Batch, PharmD, BCIDP, AAHIVP, CPP Infectious Disease Pharmacist 07/13/2024 12:46 PM

## 2024-07-14 ENCOUNTER — Inpatient Hospital Stay (HOSPITAL_COMMUNITY)

## 2024-07-14 DIAGNOSIS — R55 Syncope and collapse: Secondary | ICD-10-CM | POA: Diagnosis not present

## 2024-07-14 LAB — CULTURE, BLOOD (ROUTINE X 2)
Culture: NO GROWTH
Culture: NO GROWTH

## 2024-07-14 LAB — GLUCOSE, CAPILLARY
Glucose-Capillary: 182 mg/dL — ABNORMAL HIGH (ref 70–99)
Glucose-Capillary: 280 mg/dL — ABNORMAL HIGH (ref 70–99)
Glucose-Capillary: 287 mg/dL — ABNORMAL HIGH (ref 70–99)
Glucose-Capillary: 201 mg/dL — ABNORMAL HIGH (ref 70–99)

## 2024-07-14 LAB — BASIC METABOLIC PANEL WITH GFR
Anion gap: 9 (ref 5–15)
BUN: 8 mg/dL (ref 8–23)
CO2: 21 mmol/L — ABNORMAL LOW (ref 22–32)
Calcium: 8.5 mg/dL — ABNORMAL LOW (ref 8.9–10.3)
Chloride: 105 mmol/L (ref 98–111)
Creatinine, Ser: 0.74 mg/dL (ref 0.44–1.00)
GFR, Estimated: >60 mL/min (ref >60)
Glucose, Bld: 166 mg/dL — ABNORMAL HIGH (ref 70–99)
Potassium: 3.2 mmol/L — ABNORMAL LOW (ref 3.5–5.1)
Sodium: 135 mmol/L (ref 135–145)

## 2024-07-14 LAB — CBC
HCT: 25.6 % — ABNORMAL LOW (ref 36.0–46.0)
Hemoglobin: 8.5 g/dL — ABNORMAL LOW (ref 12.0–15.0)
MCH: 31.7 pg (ref 26.0–34.0)
MCHC: 33.2 g/dL (ref 30.0–36.0)
MCV: 95.5 fL (ref 80.0–100.0)
Platelets: 341 K/uL (ref 150–400)
RBC: 2.68 MIL/uL — ABNORMAL LOW (ref 3.87–5.11)
RDW: 13.1 % (ref 11.5–15.5)
WBC: 11.5 K/uL — ABNORMAL HIGH (ref 4.0–10.5)
nRBC: 0 % (ref 0.0–0.2)

## 2024-07-14 MED ORDER — IOHEXOL 350 MG/ML SOLN
50.0000 mL | Freq: Once | INTRAVENOUS | Status: AC | PRN
  Administered 2024-07-14: 50 mL via INTRAVENOUS

## 2024-07-14 MED ORDER — INSULIN GLARGINE-YFGN 100 UNIT/ML ~~LOC~~ SOLN
5.0000 [IU] | Freq: Every day | SUBCUTANEOUS | Status: DC
  Administered 2024-07-14 – 2024-07-15 (×2): 5 [IU] via SUBCUTANEOUS
  Filled 2024-07-14 (×2): qty 0.05

## 2024-07-14 MED ORDER — TRAMADOL HCL 50 MG PO TABS
50.0000 mg | ORAL_TABLET | Freq: Four times a day (QID) | ORAL | Status: DC | PRN
  Administered 2024-07-14: 50 mg via ORAL
  Filled 2024-07-14: qty 1

## 2024-07-14 MED ORDER — CYCLOBENZAPRINE HCL 10 MG PO TABS
5.0000 mg | ORAL_TABLET | Freq: Three times a day (TID) | ORAL | Status: DC | PRN
  Administered 2024-07-14 (×2): 5 mg via ORAL
  Filled 2024-07-14 (×2): qty 1

## 2024-07-14 MED ORDER — PREGABALIN 100 MG PO CAPS
100.0000 mg | ORAL_CAPSULE | Freq: Every evening | ORAL | Status: DC
Start: 2024-07-14 — End: 2024-07-15
  Administered 2024-07-14: 100 mg via ORAL
  Filled 2024-07-14: qty 1

## 2024-07-14 MED ORDER — ATORVASTATIN CALCIUM 10 MG PO TABS
20.0000 mg | ORAL_TABLET | Freq: Every day | ORAL | Status: DC
Start: 2024-07-14 — End: 2024-07-15
  Administered 2024-07-14 – 2024-07-15 (×2): 20 mg via ORAL
  Filled 2024-07-14 (×2): qty 2

## 2024-07-14 MED ORDER — POTASSIUM CHLORIDE CRYS ER 20 MEQ PO TBCR
40.0000 meq | EXTENDED_RELEASE_TABLET | Freq: Two times a day (BID) | ORAL | Status: AC
  Administered 2024-07-14 (×2): 40 meq via ORAL
  Filled 2024-07-14 (×2): qty 2

## 2024-07-14 MED ORDER — DONEPEZIL HCL 10 MG PO TABS
10.0000 mg | ORAL_TABLET | Freq: Every day | ORAL | Status: DC
Start: 2024-07-14 — End: 2024-07-15
  Administered 2024-07-14: 10 mg via ORAL
  Filled 2024-07-14: qty 1

## 2024-07-14 NOTE — Inpatient Diabetes Management (Signed)
 Inpatient Diabetes Program Recommendations  AACE/ADA: New Consensus Statement on Inpatient Glycemic Control  Target Ranges:  Prepandial:   less than 140 mg/dL      Peak postprandial:   less than 180 mg/dL (1-2 hours)      Critically ill patients:  140 - 180 mg/dL    Latest Reference Range & Units 07/13/24 06:14 07/13/24 11:18 07/13/24 15:52 07/13/24 21:18 07/14/24 06:23  Glucose-Capillary 70 - 99 mg/dL 812 (H) 734 (H) 711 (H) 172 (H) 182 (H)   Review of Glycemic Control  Diabetes history: DM2 Outpatient Diabetes medications: Lantus  16 units daily, Novolog  5-16 units TID with meals, Metformin  XR 750 mg daily Current orders for Inpatient glycemic control: Novolog  0-9 units TID with meals, Novolog  0-5 units QHS  Inpatient Diabetes Program Recommendations:    Insulin : May want to consider ordering Semglee  5 units daily.  Thanks, Earnie Gainer, RN, MSN, CDCES Diabetes Coordinator Inpatient Diabetes Program [phone] (Team Pager from 8am to 5pm)

## 2024-07-14 NOTE — Care Management Important Message (Signed)
 Important Message  Patient Details  Name: Jacqueline Ball MRN: [id number] Date of Birth: 1941/09/28   Important Message Given:  Yes - Medicare IM     Claretta Deed 07/14/2024, 10:16 AM

## 2024-07-14 NOTE — TOC Progression Note (Signed)
 Transition of Care [facility]) - Progression Note    Patient Details  Name: Ena Demary MRN: [id number] Date of Birth: 01-25-1941  [facility] CM/SW Contact  Almarie CHRISTELLA Goodie, [place] Phone Number: 07/14/2024, 2:45 PM  Clinical Narrative:   CSW spoke with patient's son, Aliene, to discuss SNF choice. Son chose [facility]. Son asking about patient's outpatient urology appointment and transportation, whether he'd need to reschedule. CSW sent information to MD to ask if patient's foley would be addressed inpatient, and also asked [facility] about transportation. [facility] can provide transport if needed. CSW contacted CMA to initiate insurance authorization request, awaiting approval. CSW to follow.    Expected Discharge Plan: Skilled Nursing Facility Barriers to Discharge: Continued Medical Work up, English as a second language teacher               Expected Discharge Plan and Services   Discharge Planning Services: CM Consult   Living arrangements for the past 2 months: Single Family Home                                       Social Drivers of Health (SDOH) Interventions SDOH Screenings   Food Insecurity: No Food Insecurity (07/09/2024)  Housing: Low Risk  (07/09/2024)  Transportation Needs: No Transportation Needs (07/09/2024)  Utilities: Not At Risk (07/09/2024)  Social Connections: Socially Isolated (07/09/2024)  Tobacco Use: Low Risk  (07/09/2024)    Readmission Risk Interventions    07/07/2024   10:33 AM  Readmission Risk Prevention Plan  Transportation Screening Complete  PCP or Specialist Appt within 5-7 Days Complete  Home Care Screening Complete  Medication Review (RN CM) Complete

## 2024-07-14 NOTE — Plan of Care (Signed)

## 2024-07-14 NOTE — Plan of Care (Signed)

## 2024-07-14 NOTE — Hospital Course (Addendum)
 83 y.o. 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  Discharged 2 days PTA on cefuroxime  (urine culture with Klebsiella sensitive to cephalosporins), with foley.  Two days after discharge, she passed out, was unarousable, and had hypotension and vomiting.  CT abdomen on admission showed improvement, no new findings.  Started on fluids and antibiotics broadened.  BP normalized.

## 2024-07-14 NOTE — Progress Notes (Signed)
 Physical Therapy Treatment Patient Details Name: Jacqueline Ball MRN: [id number] DOB: 1941/01/11 Today's Date: 07/14/2024   History of Present Illness Pt is an 83 y.o. female who presented 07/09/24 with syncope episode and nausea and vomiting. Pt recently admitted on 07/03/2024 and discharged on 07/07/2024 for emphysematous cystitis in the setting of Klebsiella UTI. Syncope suspected to be due to volume depletion, hypotension and likely UTI. PMH: dementia, paroxysmal afib, HLD, HTN, DM2, anxiety    PT Comments  Pt received in supine and agreeable to session. Pt demonstrates poor mobility tolerance due to back pain and fatigue. Pt able to stand to stedy with max A +2 and transfer to recliner. Pt demonstrates poor initiation of mobility tasks despite increased cues. Once fully upright, pt demonstrates improved standing balance and is able to participate in weight shifting and is able to slightly elevate R foot with increased assist for balance. Pt continues to benefit from PT services to progress toward functional mobility goals.    If plan is discharge home, recommend the following: Two people to help with walking and/or transfers;Two people to help with bathing/dressing/bathroom;Assistance with cooking/housework;Direct supervision/assist for medications management;Direct supervision/assist for financial management;Assist for transportation;Help with stairs or ramp for entrance;Supervision due to cognitive status   Can travel by private vehicle     No  Equipment Recommendations  BSC/3in1;Wheelchair (measurements PT);Wheelchair cushion (measurements PT);Hospital bed;Hoyer lift    Recommendations for Other Services       Precautions / Restrictions Precautions Precautions: Fall Recall of Precautions/Restrictions: Impaired Precaution/Restrictions Comments: watch BP Restrictions Weight Bearing Restrictions Per Provider Order: No     Mobility  Bed Mobility Overal bed mobility: Needs  Assistance Bed Mobility: Supine to Sit     Supine to sit: Mod assist, +2 for physical assistance, Used rails, HOB elevated     General bed mobility comments: Increased time and cues for sequencing. Assist for BLE advancement and trunk elevation. Pt unable to reach across to rail due to back pain    Transfers Overall transfer level: Needs assistance Equipment used: Ambulation equipment used Transfers: Sit to/from Stand, Bed to chair/wheelchair/BSC Sit to Stand: Max assist, +2 physical assistance           General transfer comment: Poor initiation despite increased cues. Use of stedy with max A +2 with bedpad to stand from EOB and stedy paddles. Pt able to weight shift with assist once upright and minimally elevate R foot, but not L foot Transfer via Lift Equipment: Stedy  Ambulation/Gait             Pre-gait activities: weight shifting in stedy with attempts to elevate B feet     Stairs             Wheelchair Mobility     Tilt Bed    Modified Rankin (Stroke Patients Only) Modified Rankin (Stroke Patients Only) Pre-Morbid Rankin Score: Moderate disability Modified Rankin: Severe disability     Balance Overall balance assessment: Needs assistance Sitting-balance support: Feet supported, Bilateral upper extremity supported, No upper extremity supported Sitting balance-Leahy Scale: Fair Sitting balance - Comments: sitting EOB with up to min A   Standing balance support: Bilateral upper extremity supported, During functional activity, Reliant on assistive device for balance Standing balance-Leahy Scale: Poor Standing balance comment: max A progressing to min A once fully upright. Reliant on BUE support with pt tending to lean down onto R forearm  Communication Communication Communication: No apparent difficulties  Cognition Arousal: Alert Behavior During Therapy: Flat affect   PT - Cognitive impairments: History of  cognitive impairments, Initiation, Sequencing, Problem solving, Attention                       PT - Cognition Comments: Pt requires dense cues for initiation and sequencing. Following commands: Impaired Following commands impaired: Follows one step commands inconsistently, Follows one step commands with increased time    Cueing Cueing Techniques: Verbal cues, Tactile cues, Gestural cues  Exercises      General Comments        Pertinent Vitals/Pain Pain Assessment Pain Assessment: Faces Faces Pain Scale: Hurts whole lot Pain Location: back Pain Descriptors / Indicators: Discomfort, Grimacing, Guarding, Moaning Pain Intervention(s): Limited activity within patient's tolerance, Monitored during session, Repositioned     PT Goals (current goals can now be found in the care plan section) Acute Rehab PT Goals Patient Stated Goal: to improve PT Goal Formulation: With patient/family Time For Goal Achievement: 07/26/24 Progress towards PT goals: Progressing toward goals    Frequency    Min 2X/week       AM-PAC PT 6 Clicks Mobility   Outcome Measure  Help needed turning from your back to your side while in a flat bed without using bedrails?: A Lot Help needed moving from lying on your back to sitting on the side of a flat bed without using bedrails?: A Lot Help needed moving to and from a bed to a chair (including a wheelchair)?: Total Help needed standing up from a chair using your arms (e.g., wheelchair or bedside chair)?: Total Help needed to walk in hospital room?: Total Help needed climbing 3-5 steps with a railing? : Total 6 Click Score: 8    End of Session Equipment Utilized During Treatment: Gait belt Activity Tolerance: Patient limited by pain Patient left: with call bell/phone within reach;in chair;with chair alarm set Nurse Communication: Mobility status;Patient requests pain meds PT Visit Diagnosis: Unsteadiness on feet (R26.81);Other  abnormalities of gait and mobility (R26.89);Muscle weakness (generalized) (M62.81);Difficulty in walking, not elsewhere classified (R26.2);Pain     Time: 8961-8942 PT Time Calculation (min) (ACUTE ONLY): 19 min  Charges:    $Therapeutic Activity: 8-22 mins PT General Charges $$ ACUTE PT VISIT: 1 Visit                     Jacqueline Ball, PTA Acute Rehabilitation Services Secure Chat Preferred  [phone]    Jacqueline Ball 07/14/2024, 1:06 PM

## 2024-07-14 NOTE — Progress Notes (Addendum)
 Progress Note   Patient: Jacqueline Ball FMW:[id number] DOB: September 02, 1941 DOA: 07/09/2024     5 DOS: the patient was seen and examined on 07/14/2024 at 11:16AM      Brief hospital course: 83 y.o. 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  Discharged 2 days PTA on cefuroxime  (urine culture with Klebsiella sensitive to cephalosporins), with foley.  Two days after discharge, she passed out, was unarousable, and had hypotension and vomiting.  CT abdomen on admission showed improvement, no new findings.  Started on fluids and antibiotics broadened.  BP normalized.       Assessment and Plan: Hypotension Syncope and collapse This was the patient's presenting complaint.  She was given fluids and her blood pressure normalized.  Suspect that her syncope was due to hypotension.  She has had no further syncope in the hospital.   Acute emphysematous cystitis Still febrile last night  but overall fever curve improving.  Urine culture from this admission no growth.  From last admission Klebsiella sens to cephalosporins.  CT on admission showed improvement.  Has an appointment on Thursday with Urology - Maintain foley - Continue Rocephin  - Would extend antibiotics at discharge  Low back pain Patient has chronic low back pain, her main complaint today is low back pain making it difficult to move in bed.  Able to transfer to the chair earlier, but doesn't want to move now.    White count better from admission, and CT on admission showed no new findings in the low back.  Given apixaban , and drop in Hgb, should rule out RP bleed. - Trial tramadol  - CT abdomen and pelvis  Dementia -Resume donepezil   Chronic atrial fibrillation Rate controlled -Continue Eliquis  for now  Diabetes Glucose elevated - Reesume glargine - Continue SS  corrections  Hyperlipidemia - Resume Lipitor  Anemia Hemoglobin trending down, suspect this is dilutional at this point given we have seen no clinical bleeding.    - Trend hemoglobin -CT abdomen as above       Subjective: Patient is demented and unable to provide independent history.  Her nurse reports that she is complaining of back pain all day, having difficulty sitting up or moving.  She also complains of leg pain with movement.  She did have a small fever yesterday.  No vomiting, no melena, no hematochezia.     Physical Exam: BP (!) 119/108 (BP Location: Left Arm)   Pulse 78   Temp 97.6 F (36.4 C) (Oral)   Resp 17   Ht 5' 5 (1.651 m)   Wt 50.3 kg   SpO2 100%   BMI 18.45 kg/m   Frail adult female, lying in bed, appears chronically ill, weak and tired RRR, no murmurs, no peripheral edema Respiratory rate normal, lungs clear without rales or wheezes She is guarding throughout, she has no rigidity, she has mild tenderness of nonfocally Attention diminished, memory seems impaired, she has generalized weakness, she resists movement due to pain, she is oriented to self and hospital only    Data Reviewed: Basic metabolic panel shows mild hypokalemia CBC shows hemoglobin down to 8.5 from 11.5     Family Communication: Son by phone, no answer, left VM    Disposition: Status is: Inpatient 83 yo F with dementia, lives at home, readmitted with emphysematous cystitis  Fever curve improving. If Hgb stable, no fever in next 24 hours  and cT shows no infectious complication or RP bleed, likely can discharge to [facility] tomorrow          Author: Lonni SHAUNNA Dalton, MD 07/14/2024 4:35 PM  For on call review [email or web address].

## 2024-07-15 DIAGNOSIS — N3080 Other cystitis without hematuria: Secondary | ICD-10-CM | POA: Diagnosis not present

## 2024-07-15 DIAGNOSIS — E119 Type 2 diabetes mellitus without complications: Secondary | ICD-10-CM | POA: Diagnosis not present

## 2024-07-15 DIAGNOSIS — E162 Hypoglycemia, unspecified: Secondary | ICD-10-CM | POA: Diagnosis not present

## 2024-07-15 DIAGNOSIS — N179 Acute kidney failure, unspecified: Secondary | ICD-10-CM | POA: Diagnosis not present

## 2024-07-15 DIAGNOSIS — R3914 Feeling of incomplete bladder emptying: Secondary | ICD-10-CM | POA: Diagnosis not present

## 2024-07-15 DIAGNOSIS — I10 Essential (primary) hypertension: Secondary | ICD-10-CM | POA: Diagnosis not present

## 2024-07-15 DIAGNOSIS — R41841 Cognitive communication deficit: Secondary | ICD-10-CM | POA: Diagnosis not present

## 2024-07-15 DIAGNOSIS — R2689 Other abnormalities of gait and mobility: Secondary | ICD-10-CM | POA: Diagnosis not present

## 2024-07-15 DIAGNOSIS — D649 Anemia, unspecified: Secondary | ICD-10-CM | POA: Diagnosis not present

## 2024-07-15 DIAGNOSIS — Z7901 Long term (current) use of anticoagulants: Secondary | ICD-10-CM | POA: Diagnosis not present

## 2024-07-15 DIAGNOSIS — R3121 Asymptomatic microscopic hematuria: Secondary | ICD-10-CM | POA: Diagnosis not present

## 2024-07-15 DIAGNOSIS — I482 Chronic atrial fibrillation, unspecified: Secondary | ICD-10-CM | POA: Diagnosis not present

## 2024-07-15 DIAGNOSIS — B961 Klebsiella pneumoniae [K. pneumoniae] as the cause of diseases classified elsewhere: Secondary | ICD-10-CM | POA: Diagnosis not present

## 2024-07-15 DIAGNOSIS — E876 Hypokalemia: Secondary | ICD-10-CM | POA: Diagnosis not present

## 2024-07-15 DIAGNOSIS — K5730 Diverticulosis of large intestine without perforation or abscess without bleeding: Secondary | ICD-10-CM | POA: Diagnosis not present

## 2024-07-15 DIAGNOSIS — E785 Hyperlipidemia, unspecified: Secondary | ICD-10-CM | POA: Diagnosis not present

## 2024-07-15 DIAGNOSIS — R1312 Dysphagia, oropharyngeal phase: Secondary | ICD-10-CM | POA: Diagnosis not present

## 2024-07-15 DIAGNOSIS — R278 Other lack of coordination: Secondary | ICD-10-CM | POA: Diagnosis not present

## 2024-07-15 DIAGNOSIS — R338 Other retention of urine: Secondary | ICD-10-CM | POA: Diagnosis not present

## 2024-07-15 DIAGNOSIS — E0940 Drug or chemical induced diabetes mellitus with neurological complications with diabetic neuropathy, unspecified: Secondary | ICD-10-CM | POA: Diagnosis not present

## 2024-07-15 DIAGNOSIS — M6281 Muscle weakness (generalized): Secondary | ICD-10-CM | POA: Diagnosis not present

## 2024-07-15 DIAGNOSIS — Z743 Need for continuous supervision: Secondary | ICD-10-CM | POA: Diagnosis not present

## 2024-07-15 DIAGNOSIS — N3281 Overactive bladder: Secondary | ICD-10-CM | POA: Diagnosis not present

## 2024-07-15 DIAGNOSIS — K219 Gastro-esophageal reflux disease without esophagitis: Secondary | ICD-10-CM | POA: Diagnosis not present

## 2024-07-15 DIAGNOSIS — F0280 Dementia in other diseases classified elsewhere without behavioral disturbance: Secondary | ICD-10-CM | POA: Diagnosis not present

## 2024-07-15 DIAGNOSIS — N138 Other obstructive and reflux uropathy: Secondary | ICD-10-CM | POA: Diagnosis not present

## 2024-07-15 DIAGNOSIS — E871 Hypo-osmolality and hyponatremia: Secondary | ICD-10-CM | POA: Diagnosis not present

## 2024-07-15 DIAGNOSIS — F419 Anxiety disorder, unspecified: Secondary | ICD-10-CM | POA: Diagnosis not present

## 2024-07-15 DIAGNOSIS — N281 Cyst of kidney, acquired: Secondary | ICD-10-CM | POA: Diagnosis not present

## 2024-07-15 DIAGNOSIS — A419 Sepsis, unspecified organism: Secondary | ICD-10-CM | POA: Diagnosis not present

## 2024-07-15 DIAGNOSIS — R42 Dizziness and giddiness: Secondary | ICD-10-CM | POA: Diagnosis not present

## 2024-07-15 DIAGNOSIS — Z7401 Bed confinement status: Secondary | ICD-10-CM | POA: Diagnosis not present

## 2024-07-15 DIAGNOSIS — N390 Urinary tract infection, site not specified: Secondary | ICD-10-CM | POA: Diagnosis not present

## 2024-07-15 DIAGNOSIS — R55 Syncope and collapse: Secondary | ICD-10-CM | POA: Diagnosis not present

## 2024-07-15 DIAGNOSIS — I700 Atherosclerosis of aorta: Secondary | ICD-10-CM | POA: Diagnosis not present

## 2024-07-15 DIAGNOSIS — M1990 Unspecified osteoarthritis, unspecified site: Secondary | ICD-10-CM | POA: Diagnosis not present

## 2024-07-15 DIAGNOSIS — G301 Alzheimer's disease with late onset: Secondary | ICD-10-CM | POA: Diagnosis not present

## 2024-07-15 DIAGNOSIS — R4182 Altered mental status, unspecified: Secondary | ICD-10-CM | POA: Diagnosis not present

## 2024-07-15 LAB — COMPREHENSIVE METABOLIC PANEL WITH GFR
ALT: 45 U/L — ABNORMAL HIGH (ref 0–44)
AST: 31 U/L (ref 15–41)
Albumin: 2.2 g/dL — ABNORMAL LOW (ref 3.5–5.0)
Alkaline Phosphatase: 89 U/L (ref 38–126)
Anion gap: 10 (ref 5–15)
BUN: 7 mg/dL — ABNORMAL LOW (ref 8–23)
CO2: 21 mmol/L — ABNORMAL LOW (ref 22–32)
Calcium: 9.1 mg/dL (ref 8.9–10.3)
Chloride: 106 mmol/L (ref 98–111)
Creatinine, Ser: 0.64 mg/dL (ref 0.44–1.00)
GFR, Estimated: >60 mL/min (ref >60)
Glucose, Bld: 170 mg/dL — ABNORMAL HIGH (ref 70–99)
Potassium: 3.9 mmol/L (ref 3.5–5.1)
Sodium: 137 mmol/L (ref 135–145)
Total Bilirubin: 0.5 mg/dL (ref 0.0–1.2)
Total Protein: 6.7 g/dL (ref 6.5–8.1)

## 2024-07-15 LAB — GLUCOSE, CAPILLARY
Glucose-Capillary: 148 mg/dL — ABNORMAL HIGH (ref 70–99)
Glucose-Capillary: 276 mg/dL — ABNORMAL HIGH (ref 70–99)

## 2024-07-15 LAB — CBC
HCT: 28.9 % — ABNORMAL LOW (ref 36.0–46.0)
Hemoglobin: 9.5 g/dL — ABNORMAL LOW (ref 12.0–15.0)
MCH: 31.4 pg (ref 26.0–34.0)
MCHC: 32.9 g/dL (ref 30.0–36.0)
MCV: 95.4 fL (ref 80.0–100.0)
Platelets: 411 K/uL — ABNORMAL HIGH (ref 150–400)
RBC: 3.03 MIL/uL — ABNORMAL LOW (ref 3.87–5.11)
RDW: 13.2 % (ref 11.5–15.5)
WBC: 9.8 K/uL (ref 4.0–10.5)
nRBC: 0 % (ref 0.0–0.2)

## 2024-07-15 MED ORDER — CEFUROXIME AXETIL 500 MG PO TABS: 500.0000 mg | ORAL_TABLET | Freq: Two times a day (BID) | ORAL | Status: AC

## 2024-07-15 MED ORDER — TRAMADOL HCL 50 MG PO TABS: 50.0000 mg | ORAL_TABLET | Freq: Four times a day (QID) | ORAL | 0 refills | Status: AC | PRN

## 2024-07-15 NOTE — Progress Notes (Signed)
Patient discharged to [facility], transported by [facility]

## 2024-07-15 NOTE — Discharge Summary (Signed)
 Physician Discharge Summary  Jacqueline Ball FMW:[id number] DOB: 11-20-1941 DOA: 07/09/2024  PCP: Haze Kingfisher, MD  Admit date: 07/09/2024 Discharge date: 07/15/2024  Admitted From: home Disposition:  SNF  Recommendations for Outpatient Follow-up:  Follow up with PCP in 1-2 weeks Follow up with Urology as scheduled next week Continue antibiotics until seen by Urology   Home Health: none Equipment/Devices: none  Discharge Condition: stable CODE STATUS: Full code Diet Orders (From admission, onward)     Start     Ordered   07/09/24 1824  Diet Carb Modified Fluid consistency: Nectar Thick; Room service appropriate? Yes with Assist  Diet effective now       Question Answer Comment  Diet-HS Snack? Nothing   Calorie Level Medium 1600-2000   Fluid consistency: Nectar Thick   Room service appropriate? Yes with Assist      07/09/24 1823            Brief Narrative / Interim history: 83 y.o. 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 Discharged 2 days PTA on cefuroxime  (urine culture with Klebsiella sensitive to cephalosporins), with foley.  Two days after discharge, she passed out, was unarousable, and had hypotension and vomiting. CT abdomen on admission showed improvement, no new findings.  Started on fluids and antibiotics broadened.  BP normalized.  Hospital Course / Discharge diagnoses: Principal problem Hypotension, syncope and collapse - This was the patient's presenting complaint.  She was given fluids and her blood pressure normalized.  Suspect that her syncope was due to hypotension.  She has had no further syncope in the hospital.   Active problems Acute emphysematous cystitis -curve improving.  CT scan of the abdomen and pelvis with improved cystitis.  She has follow-up with urology next week, spoke with Dr.  Keneth and he recommends to leave Foley in until he sees her as an outpatient. Urine culture from this admission no growth.  From last admission Klebsiella sens to cephalosporins.  CT on admission showed improvement.  Continue antibiotics on discharge up until she sees urology Low back pain - Chronic Dementia - Resume donepezil  Chronic atrial fibrillation - Rate controlled. Continue Eliquis  for now Diabetes - Glucose elevated, continue home regimen Hyperlipidemia - Resume Lipitor Anemia - Hemoglobin trending down, suspect this is dilutional at this point given we have seen no clinical bleeding.  Hemoglobin trend stable and improving  Sepsis ruled out   Discharge Instructions   Allergies as of 07/15/2024       Reactions   Metoprolol  Nausea And Vomiting        Medication List     STOP taking these medications    cefdinir  300 MG capsule Commonly known as: OMNICEF        TAKE these medications    acetaminophen  500 MG tablet Commonly known as: TYLENOL  Take 1,000 mg by mouth every 6 (six) hours as needed for pain.   atorvastatin  20 MG tablet Commonly known as: LIPITOR Take 20 mg by mouth daily.   Blood Glucose Monitoring Suppl Devi 1 each by Does not apply route in the morning, at noon, and at bedtime. May substitute to any manufacturer covered by patient's insurance.   BLOOD GLUCOSE TEST STRIPS Strp 1 each by In Vitro route in the morning, at noon, and at bedtime. May substitute to any manufacturer covered by patient's insurance.   cefUROXime  500 MG tablet Commonly known as:  CEFTIN  Take 1 tablet (500 mg total) by mouth 2 (two) times daily with a meal for 7 days.   donepezil  5 MG tablet Commonly known as: ARICEPT  Take 10 mg by mouth at bedtime.   Eliquis  2.5 MG Tabs tablet Generic drug: apixaban  Take 2.5 mg by mouth 2 (two) times daily.   insulin  aspart 100 UNIT/ML FlexPen Commonly known as: NOVOLOG  Inject 5-16 Units into the skin 3 (three) times daily with  meals. If eating and Blood Glucose (BG) 80 or higher inject 5 units. If not eating, correction dose only. BG <150= 5 unit; BG 150-200= 6 unit; BG 201-250= 8 unit; BG 251-300= 10 unit; BG 301-350= 12 unit; BG 351-400= 14 unit; BG >400= 16 unit and Call Primary Care.   insulin  glargine 100 UNIT/ML Solostar Pen Commonly known as: LANTUS  Inject 16 Units into the skin daily. May substitute as needed per insurance.   Lancet Device Misc 1 each by Does not apply route in the morning, at noon, and at bedtime. May substitute to any manufacturer covered by patient's insurance.   Lancets Misc. Misc 1 each by Does not apply route in the morning, at noon, and at bedtime. May substitute to any manufacturer covered by patient's insurance.   memantine  10 MG tablet Commonly known as: NAMENDA  Take 1 tablet (10 mg at night) for 2 weeks, then increase to 1 tablet (10 mg) twice a day   metFORMIN  750 MG 24 hr tablet Commonly known as: GLUCOPHAGE -XR Take 750 mg by mouth daily with breakfast.   Pen Needles 31G X 5 MM Misc 1 each by Does not apply route 3 (three) times daily. May dispense any manufacturer covered by patient's insurance.   pregabalin  100 MG capsule Commonly known as: LYRICA  Take 100 mg by mouth every evening.   traMADol  50 MG tablet Commonly known as: ULTRAM  Take 1 tablet (50 mg total) by mouth every 6 (six) hours as needed for moderate pain (pain score 4-6) or severe pain (pain score 7-10).        Contact information for after-discharge care     Destination     [facility] .   Service: Skilled Nursing Contact information: [address] [place]  [phone]                     Consultations: none  Procedures/Studies:  CT ABDOMEN PELVIS W CONTRAST Result Date: 07/14/2024 CLINICAL DATA:  Recurrent UTIs, history of emphysematous cystitis. EXAM: CT ABDOMEN AND PELVIS WITH CONTRAST TECHNIQUE: Multidetector CT imaging of the abdomen and  pelvis was performed using the standard protocol following bolus administration of intravenous contrast. RADIATION DOSE REDUCTION: This exam was performed according to the departmental dose-optimization program which includes automated exposure control, adjustment of the mA and/or kV according to patient size and/or use of iterative reconstruction technique. CONTRAST:  50mL OMNIPAQUE  IOHEXOL  350 MG/ML SOLN COMPARISON:  07/09/2024 FINDINGS: Lower chest: Mild right basilar atelectasis is noted increased when compared with the prior exam. Hepatobiliary: Scattered tiny hypodensities are noted throughout the liver likely representing small cysts but incompletely evaluated on this exam. The gallbladder is within normal limits. Pancreas: Unremarkable. No pancreatic ductal dilatation or surrounding inflammatory changes. Spleen: Normal in size without focal abnormality. Adrenals/Urinary Tract: Adrenal glands are within normal limits. Kidneys are well visualized bilaterally. No renal calculi or obstructive changes are seen. A few scattered cysts are again identified bilaterally stable from the prior study. No further follow-up is recommended. No obstructive changes are seen.  The bladder is decompressed by Foley catheter. No emphysematous changes are seen. Stomach/Bowel: Mild diverticular change of the colon is noted. The appendix is within normal limits. No findings to suggest appendicitis are seen. Stomach and small bowel are within normal limits. Vascular/Lymphatic: Aortic atherosclerosis. No enlarged abdominal or pelvic lymph nodes. Reproductive: Status post hysterectomy. No adnexal masses. Other: No abdominal wall hernia or abnormality. No abdominopelvic ascites. Musculoskeletal: No acute or significant osseous findings. IMPRESSION: Mild diverticular change without diverticulitis. Complete resolution of previously seen emphysematous cystitis. Stable renal cysts.  No follow-up is recommended. Minimal right basilar  atelectasis. Electronically Signed   By: Oneil Devonshire M.D.   On: 07/14/2024 20:47   MR BRAIN W WO CONTRAST Result Date: 07/10/2024 CLINICAL DATA:  Possible meningitis or subtle SAH EXAM: MRI HEAD WITHOUT AND WITH CONTRAST TECHNIQUE: Multiplanar, multiecho pulse sequences of the brain and surrounding structures were obtained without and with intravenous contrast. CONTRAST:  5.42mL GADAVIST  GADOBUTROL  1 MMOL/ML IV SOLN COMPARISON:  CT the head dated July 09, 2024. FINDINGS: Brain: There is no restricted diffusion to indicate acute or recent infarction. There is age-related atrophy and mild-to-moderate periventricular white matter disease. There is no evidence of hemorrhage, mass, cortical infarct or hydrocephalus. There is no abnormal parenchymal or meningeal disease. Vascular: Normal flow voids. Skull and upper cervical spine: Normal marrow signal. No osseous lesions. Sinuses/Orbits: Clear paranasal sinuses. Status post bilateral lens replacement. Other: None. IMPRESSION: 1. Age-related atrophy and mild to moderate cerebral white matter disease. No apparent acute process. Electronically Signed   By: Evalene Coho M.D.   On: 07/10/2024 15:19   ECHOCARDIOGRAM COMPLETE Result Date: 07/10/2024    ECHOCARDIOGRAM REPORT   Patient Name:   Indria Bishara Date of Exam: 07/10/2024 Medical Rec #:  [id number]            Height:       65.0 in Accession #:    [id number]           Weight:       119.3 lb Date of Birth:  12/21/1940            BSA:          1.588 m Patient Age:    83 years             BP:           154/76 mmHg Patient Gender: F                    HR:           86 bpm. Exam Location:  Inpatient Procedure: 2D Echo, Color Doppler and Cardiac Doppler (Both Spectral and Color            Flow Doppler were utilized during procedure). Indications:    Syncope R55  History:        Patient has no prior history of Echocardiogram examinations.  Sonographer:    Tinnie Gosling RDCS Referring Phys: MARIO GAILS PATEL  IMPRESSIONS  1. Left ventricular ejection fraction, by estimation, is 60 to 65%. The left ventricle has normal function. The left ventricle has no regional wall motion abnormalities. There is mild asymmetric left ventricular hypertrophy of the basal-septal segment. Left ventricular diastolic parameters were normal.  2. Right ventricular systolic function is normal. The right ventricular size is normal.  3. The mitral valve is normal in structure. No evidence of mitral valve regurgitation. No evidence of mitral stenosis.  4. The aortic valve is  tricuspid. Aortic valve regurgitation is not visualized. No aortic stenosis is present.  5. The inferior vena cava is normal in size with greater than 50% respiratory variability, suggesting right atrial pressure of 3 mmHg. FINDINGS  Left Ventricle: Left ventricular ejection fraction, by estimation, is 60 to 65%. The left ventricle has normal function. The left ventricle has no regional wall motion abnormalities. The left ventricular internal cavity size was normal in size. There is  mild asymmetric left ventricular hypertrophy of the basal-septal segment. Left ventricular diastolic parameters were normal. Right Ventricle: The right ventricular size is normal. No increase in right ventricular wall thickness. Right ventricular systolic function is normal. Left Atrium: Left atrial size was normal in size. Right Atrium: Right atrial size was normal in size. Pericardium: There is no evidence of pericardial effusion. Mitral Valve: The mitral valve is normal in structure. No evidence of mitral valve regurgitation. No evidence of mitral valve stenosis. Tricuspid Valve: The tricuspid valve is normal in structure. Tricuspid valve regurgitation is not demonstrated. No evidence of tricuspid stenosis. Aortic Valve: The aortic valve is tricuspid. Aortic valve regurgitation is not visualized. No aortic stenosis is present. Pulmonic Valve: The pulmonic valve was normal in structure. Pulmonic  valve regurgitation is not visualized. No evidence of pulmonic stenosis. Aorta: The aortic root is normal in size and structure. Venous: The inferior vena cava is normal in size with greater than 50% respiratory variability, suggesting right atrial pressure of 3 mmHg. IAS/Shunts: No atrial level shunt detected by color flow Doppler.  LEFT VENTRICLE PLAX 2D LVIDd:         3.50 cm   Diastology LVIDs:         2.50 cm   LV e' medial:    10.20 cm/s LV PW:         0.90 cm   LV E/e' medial:  7.5 LV IVS:        1.00 cm   LV e' lateral:   8.92 cm/s LVOT diam:     1.70 cm   LV E/e' lateral: 8.6 LV SV:         49 LV SV Index:   31 LVOT Area:     2.27 cm  RIGHT VENTRICLE             IVC RV S prime:     13.20 cm/s  IVC diam: 1.40 cm TAPSE (M-mode): 1.6 cm LEFT ATRIUM           Index LA diam:      3.00 cm 1.89 cm/m LA Vol (A4C): 36.8 ml 23.17 ml/m  AORTIC VALVE LVOT Vmax:   116.00 cm/s LVOT Vmean:  74.700 cm/s LVOT VTI:    0.216 m  AORTA Ao Root diam: 2.90 cm Ao Asc diam:  3.20 cm MITRAL VALVE MV Area (PHT): 3.85 cm     SHUNTS MV Decel Time: 197 msec     Systemic VTI:  0.22 m MV E velocity: 76.50 cm/s   Systemic Diam: 1.70 cm MV A velocity: 108.00 cm/s MV E/A ratio:  0.71 Morene Brownie Electronically signed by Morene Brownie Signature Date/Time: 07/10/2024/12:10:59 PM    Final    CT ANGIO HEAD NECK W WO CM Result Date: 07/10/2024 CLINICAL DATA:  Headache, new onset.  Syncope/presyncope. EXAM: CT ANGIOGRAPHY HEAD AND NECK WITH AND WITHOUT CONTRAST TECHNIQUE: Multidetector CT imaging of the head and neck was performed using the standard protocol during bolus administration of intravenous contrast. Multiplanar CT image reconstructions and MIPs were obtained  to evaluate the vascular anatomy. Carotid stenosis measurements (when applicable) are obtained utilizing NASCET criteria, using the distal internal carotid diameter as the denominator. RADIATION DOSE REDUCTION: This exam was performed according to the departmental  dose-optimization program which includes automated exposure control, adjustment of the mA and/or kV according to patient size and/or use of iterative reconstruction technique. CONTRAST:  75mL OMNIPAQUE  IOHEXOL  350 MG/ML SOLN COMPARISON:  CT head 07/09/2024. FINDINGS: CT HEAD FINDINGS Brain: No acute intracranial hemorrhage. No CT evidence of acute infarct. Nonspecific hypoattenuation in the periventricular and subcortical white matter favored to reflect chronic microvascular ischemic changes. Mild parenchymal volume loss. No edema, mass effect, or midline shift. The basilar cisterns are patent. Ventricles: The ventricles are normal. Vascular: Atherosclerotic calcifications of the carotid siphons. No hyperdense vessel. Skull: No acute or aggressive finding. Orbits: Orbits are symmetric. Sinuses: The visualized paranasal sinuses are clear. Other: Mastoid air cells are clear. CTA NECK FINDINGS Aortic arch: Common origin of the brachiocephalic and left common carotid arteries. Imaged portion shows no evidence of aneurysm or dissection. No significant stenosis of the major arch vessel origins. Pulmonary arteries: As permitted by contrast timing, there are no filling defects in the visualized pulmonary arteries. Subclavian arteries: The subclavian arteries are patent bilaterally. Right carotid system: No evidence of dissection, stenosis (50% or greater), or occlusion. Minimal atherosclerosis at the carotid bifurcation. Left carotid system: No evidence of dissection, stenosis (50% or greater), or occlusion. Vertebral arteries: Codominant. No evidence of dissection, stenosis (50% or greater), or occlusion. Tortuosity of the bilateral V1 and V2 segments. Skeleton: No acute findings. Degenerative changes in the cervical spine. Intervertebral disc space narrowing and degenerative endplate osteophytes most pronounced at C3-4. Dental caries. Chronic appearing deformity of the C7 and T1 spinous processes. Other neck: The  visualized airway is patent. No cervical lymphadenopathy. Upper chest: Visualized lung apices are clear. Review of the MIP images confirms the above findings CTA HEAD FINDINGS ANTERIOR CIRCULATION: The intracranial ICAs are patent bilaterally. Mild scattered atherosclerosis in the carotid siphons. No significant stenosis, proximal occlusion, aneurysm, or vascular malformation. MCAs: The middle cerebral arteries are patent bilaterally. ACAs: The anterior cerebral arteries are patent bilaterally. POSTERIOR CIRCULATION: No significant stenosis, proximal occlusion, aneurysm, or vascular malformation. PCAs: Patent bilaterally. Irregularity of the right P2 segment with focal moderate stenosis. There is additional mild irregularity and mild narrowing of the P2 segment of the left PCA. Pcomm: The posterior communicating arteries are visualized bilaterally. SCAs: The superior cerebellar arteries are patent bilaterally. Basilar artery: Patent AICAs: Not well visualized. PICAs: Patent Vertebral arteries: The intracranial vertebral arteries are patent. Venous sinuses: As permitted by contrast timing, patent. Anatomic variants: None Review of the MIP images confirms the above findings IMPRESSION: No large vessel occlusion. Irregularity and focal moderate stenosis of the P2 segment right PCA. Additional mild stenosis of the P2 segment of the left PCA. Scattered atherosclerosis as above. No high-grade stenosis of the arteries in the neck. No CT evidence of acute intracranial abnormality. Chronic microvascular ischemic changes and mild parenchymal volume loss. Electronically Signed   By: Donnice Mania M.D.   On: 07/10/2024 11:11   CT CERVICAL SPINE WO CONTRAST Result Date: 07/09/2024 CLINICAL DATA:  neck pain not sure if we can see c spine on her ct head or if we can do it as no charge. thanks EXAM: CT CERVICAL SPINE WITHOUT CONTRAST TECHNIQUE: Multidetector CT imaging of the cervical spine was performed without intravenous  contrast. Multiplanar CT image reconstructions were also generated. RADIATION  DOSE REDUCTION: This exam was performed according to the departmental dose-optimization program which includes automated exposure control, adjustment of the mA and/or kV according to patient size and/or use of iterative reconstruction technique. COMPARISON:  CT C-spine 12/28/2022. FINDINGS: Alignment: Reversal of the normal cervical lordosis centered at the C3-C5 levels likely due to positioning and degenerative changes. Grade 1 anterolisthesis of C2 on C3. Mild retrolisthesis of C5 on C6 and C6 on C7. Skull base and vertebrae: Slight motion artifact. Multilevel moderate degenerative changes spine. No associated severe osseous neural foraminal or central canal stenosis. No acute fracture. No aggressive appearing focal osseous lesion or focal pathologic process. Soft tissues and spinal canal: No prevertebral fluid or swelling. No visible canal hematoma. Upper chest: Unremarkable. Other: None. IMPRESSION: No acute displaced fracture or traumatic listhesis of the cervical spine. Slightly limited by motion artifact. Electronically Signed   By: Morgane  Naveau M.D.   On: 07/09/2024 18:04   CT ABDOMEN PELVIS W CONTRAST Result Date: 07/09/2024 CLINICAL DATA:  Recent mission for UTI and also some abnormal free air in abdomen previously. Abdominal tenderness hypotension and syncope. EXAM: CT ABDOMEN AND PELVIS WITH CONTRAST TECHNIQUE: Multidetector CT imaging of the abdomen and pelvis was performed using the standard protocol following bolus administration of intravenous contrast. RADIATION DOSE REDUCTION: This exam was performed according to the departmental dose-optimization program which includes automated exposure control, adjustment of the mA and/or kV according to patient size and/or use of iterative reconstruction technique. CONTRAST:  75mL OMNIPAQUE  IOHEXOL  350 MG/ML SOLN COMPARISON:  CT of the abdomen and pelvis dated July 03, 2024.  FINDINGS: Lower chest: Mild dependent atelectasis within the lower lobes bilaterally. The nondependent lungs are clear. Hepatobiliary: Small hypodensities are again seen scattered throughout the liver, compatible with cysts. The gallbladder is unremarkable. There is no biliary ductal dilatation. Pancreas: Normal. Spleen: Normal. Adrenals/Urinary Tract: There is mild left renal cortical atrophy and there are few renal cysts present. Trace left perinephric fluid. The ureters are normal in caliber. A Foley catheter is present within the collapsed urinary bladder. 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 There is a small volume of air present within the bladder lumen anteriorly. There are few bubbles of air within the surrounding perivesicular fat. The adrenal glands remain unremarkable. Stomach/Bowel: There are few scattered colonic diverticula present. There is no evidence of diverticulitis. The stomach, small bowel and appendix are unremarkable. Vascular/Lymphatic: Moderate calcific atheromatous disease within the abdominal aorta. The mesenteric and renal arteries are widely patent. The inferior vena cava is unremarkable. There is no lymphadenopathy. Reproductive: Status post hysterectomy. No adnexal masses. Other: None. Musculoskeletal: No osseous lesions. IMPRESSION: 1. 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 2. Multiple left renal cysts with trace perinephric fluid. 3. Mild sigmoid diverticulosis. Electronically Signed   By: Evalene Coho M.D.   On: 07/09/2024 12:41   CT Head Wo Contrast Result Date: 07/09/2024 CLINICAL DATA:  Mental status change, persistent or worsening Hypotension and syncope this morning. Recent mission for UTI and abnormal abdominal CT. Distally. EXAM: CT HEAD WITHOUT CONTRAST TECHNIQUE: Contiguous axial images were obtained from the base of the skull through the vertex without intravenous contrast. RADIATION DOSE REDUCTION:  This exam was performed according to the departmental dose-optimization program which includes automated exposure control, adjustment of the mA and/or kV according to patient size and/or use of iterative reconstruction technique. COMPARISON:  CT the head dated March 15, 2024. FINDINGS: Brain: Age-related atrophy and mild periventricular white  matter disease. No evidence of hemorrhage, mass, acute cortical infarct or hydrocephalus. Vascular: Mild vascular calcifications Skull: Intact and unremarkable. Sinuses/Orbits: The visualized paranasal sinuses and mastoid air cells are clear. Patient is status post bilateral lens replacement. Other: None. IMPRESSION: Age-related atrophy and mild periventricular white matter disease. No apparent acute process. Electronically Signed   By: Evalene Coho M.D.   On: 07/09/2024 12:30   DG Chest Portable 1 View Result Date: 07/09/2024 CLINICAL DATA:  Syncope, hypertension. EXAM: PORTABLE CHEST 1 VIEW COMPARISON:  March 15, 2024. FINDINGS: The heart size and mediastinal contours are within normal limits. Both lungs are clear. The visualized skeletal structures are unremarkable. IMPRESSION: No active disease. Electronically Signed   By: Lynwood Landy Raddle M.D.   On: 07/09/2024 11:29   CT ABDOMEN PELVIS W CONTRAST Result Date: 07/03/2024 CLINICAL DATA:  Two day history of dysuria, urinary frequency, and hematuria. Urinary tract infection EXAM: CT ABDOMEN AND PELVIS WITH CONTRAST TECHNIQUE: Multidetector CT imaging of the abdomen and pelvis was performed using the standard protocol following bolus administration of intravenous contrast. RADIATION DOSE REDUCTION: This exam was performed according to the departmental dose-optimization program which includes automated exposure control, adjustment of the mA and/or kV according to patient size and/or use of iterative reconstruction technique. CONTRAST:  75mL OMNIPAQUE  IOHEXOL  350 MG/ML SOLN COMPARISON:  CT abdomen and pelvis dated  06/02/2024 FINDINGS: Lower chest: No focal consolidation or pulmonary nodule in the lung bases. No pleural effusion or pneumothorax demonstrated. Partially imaged heart size is normal. Hepatobiliary: Scattered subcentimeter hypodensities, too small to characterize but likely cysts. No intra or extrahepatic biliary ductal dilation. Normal gallbladder. Pancreas: No focal lesions or main ductal dilation. Spleen: Normal in size without focal abnormality. Adrenals/Urinary Tract: No adrenal nodules. No suspicious renal mass, calculi or hydronephrosis. Unchanged multifocal left renal cysts, many of which are mildly hyperattenuating and likely hemorrhagic/proteinaceous. Multiple additional subcentimeter hypodensities, too small to characterize but also likely cysts. Extensive, circumferential submucosal emphysema. Circumferential mural thickening. Stomach/Bowel: Normal appearance of the stomach. No evidence of bowel wall thickening, distention, or inflammatory changes. Colonic diverticulosis without acute diverticulitis. Normal appendix. Vascular/Lymphatic: Aortic atherosclerosis. No enlarged abdominal or pelvic lymph nodes. Reproductive: No adnexal masses. Other: Small volume free air dissecting through the peritoneal fat surrounding the bladder. A few of the gaseous foci are closely associated with loops of small bowel in the lower abdomen. Musculoskeletal: No acute or abnormal lytic or blastic osseous lesions. Multilevel degenerative changes of the partially imaged thoracic and lumbar spine. IMPRESSION: 1. 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 A few of the gaseous foci are closely associated with loops of small bowel in the lower abdomen and may be intraperitoneal. 2. Colonic diverticulosis without acute diverticulitis. 3.  Aortic Atherosclerosis (ICD10-I70.0). Critical Value/emergent results were called by telephone at the time of  interpretation on 07/03/2024 at 7:24 pm to provider Carlo cornish, who verbally acknowledged these results. Electronically Signed   By: Limin  Xu M.D.   On: 07/03/2024 19:32     Subjective: - no chest pain, shortness of breath, no abdominal pain, nausea or vomiting.   Discharge Exam: BP 137/77 (BP Location: Right Arm)   Pulse 78   Temp (!) 97.4 F (36.3 C) (Oral)   Resp 16   Ht 5' 5 (1.651 m)   Wt 50.8 kg   SpO2 98%   BMI 18.64 kg/m   General: Pt is alert, awake, not in acute distress Cardiovascular: RRR,  S1/S2 +, no rubs, no gallops Respiratory: CTA bilaterally, no wheezing, no rhonchi Abdominal: Soft, NT, ND, bowel sounds + Extremities: no edema, no cyanosis    The results of significant diagnostics from this hospitalization (including imaging, microbiology, ancillary and laboratory) are listed below for reference.     Microbiology: Recent Results (from the past 240 hours)  Blood culture (routine x 2)     Status: None   Collection Time: 07/09/24 10:40 AM   Specimen: BLOOD LEFT HAND  Result Value Ref Range Status   Specimen Description BLOOD LEFT HAND  Final   Special Requests   Final    BOTTLES DRAWN AEROBIC ONLY Blood Culture results may not be optimal due to an inadequate volume of blood received in culture bottles   Culture   Final    NO GROWTH 5 DAYS Performed at [facility], [address], [place], [place] [address]    Report Status 07/14/2024 FINAL  Final  Resp panel by RT-PCR (RSV, Flu A&B, Covid) Anterior Nasal Swab     Status: None   Collection Time: 07/09/24 10:42 AM   Specimen: Anterior Nasal Swab  Result Value Ref Range Status   SARS Coronavirus 2 by RT PCR NEGATIVE NEGATIVE Final   Influenza A by PCR NEGATIVE NEGATIVE Final   Influenza B by PCR NEGATIVE NEGATIVE Final    Comment: (NOTE) The Xpert Xpress SARS-CoV-2/FLU/RSV plus assay is intended as an aid in the diagnosis of influenza from Nasopharyngeal swab specimens and should not be used  as a sole basis for treatment. Nasal washings and aspirates are unacceptable for Xpert Xpress SARS-CoV-2/FLU/RSV testing.  Fact Sheet for Patients: BloggerCourse.com  Fact Sheet for Healthcare Providers: SeriousBroker.it  This test is not yet approved or cleared by the [place]  FDA and has been authorized for detection and/or diagnosis of SARS-CoV-2 by FDA under an Emergency Use Authorization (EUA). This EUA will remain in effect (meaning this test can be used) for the duration of the COVID-19 declaration under Section 564(b)(1) of the Act, 21 U.S.C. section 360bbb-3(b)(1), unless the authorization is terminated or revoked.     Resp Syncytial Virus by PCR NEGATIVE NEGATIVE Final    Comment: (NOTE) Fact Sheet for Patients: BloggerCourse.com  Fact Sheet for Healthcare Providers: SeriousBroker.it  This test is not yet approved or cleared by the [place]  FDA and has been authorized for detection and/or diagnosis of SARS-CoV-2 by FDA under an Emergency Use Authorization (EUA). This EUA will remain in effect (meaning this test can be used) for the duration of the COVID-19 declaration under Section 564(b)(1) of the Act, 21 U.S.C. section 360bbb-3(b)(1), unless the authorization is terminated or revoked.  Performed at [facility], [address] [place], [place] [address]   Blood culture (routine x 2)     Status: None   Collection Time: 07/09/24 10:45 AM   Specimen: BLOOD LEFT HAND  Result Value Ref Range Status   Specimen Description BLOOD LEFT HAND  Final   Special Requests   Final    BOTTLES DRAWN AEROBIC ONLY Blood Culture results may not be optimal due to an inadequate volume of blood received in culture bottles   Culture   Final    NO GROWTH 5 DAYS Performed at [facility], [address], [place], [place] [address]    Report Status 07/14/2024 FINAL   Final  Culture, OB Urine     Status: None   Collection Time: 07/10/24  6:10 PM  Specimen: Urine, Random  Result Value Ref Range Status   Specimen Description URINE, RANDOM  Final   Special Requests NONE  Final   Culture   Final    NO GROWTH NO GROUP B STREP (S.AGALACTIAE) ISOLATED Performed at [facility], [address], [place], [place] [address]    Report Status 07/11/2024 FINAL  Final     Labs: Basic Metabolic Panel: Recent Labs  Lab 07/09/24 1049 07/10/24 0308 07/11/24 0311 07/14/24 0831 07/15/24 0418  NA 139 135 136 135 137  K 3.6 4.5 3.6 3.2* 3.9  CL 103 100 100 105 106  CO2 22 23 22  21* 21*  GLUCOSE 133* 269* 165* 166* 170*  BUN 17 17 9 8  7*  CREATININE 1.01* 0.97 0.75 0.74 0.64  CALCIUM  9.6 9.3 9.3 8.5* 9.1  MG  --   --  1.7  --   --   PHOS  --   --  2.7  --   --    Liver Function Tests: Recent Labs  Lab 07/09/24 1049 07/10/24 0308 07/11/24 0311 07/15/24 0418  AST 47* 30  --  31  ALT 68* 50*  --  45*  ALKPHOS 100 83  --  89  BILITOT 0.6 0.5  --  0.5  PROT 6.7 6.0*  --  6.7  ALBUMIN 3.3* 2.9* 3.0* 2.2*   CBC: Recent Labs  Lab 07/09/24 1049 07/10/24 0308 07/11/24 0311 07/14/24 0831 07/15/24 0418  WBC 14.8* 10.2 13.0* 11.5* 9.8  NEUTROABS  --   --  9.8*  --   --   HGB 11.9* 10.5* 11.5* 8.5* 9.5*  HCT 38.4 31.8* 34.5* 25.6* 28.9*  MCV 102.4* 96.1 95.0 95.5 95.4  PLT 271 269 265 341 411*   CBG: Recent Labs  Lab 07/14/24 1122 07/14/24 1558 07/14/24 2108 07/15/24 0618 07/15/24 1131  GLUCAP 280* 287* 201* 148* 276*   Hgb A1c No results for input(s): HGBA1C in the last 72 hours. Lipid Profile No results for input(s): CHOL, HDL, LDLCALC, TRIG, CHOLHDL, LDLDIRECT in the last 72 hours. Thyroid  function studies No results for input(s): TSH, T4TOTAL, T3FREE, THYROIDAB in the last 72 hours.  Invalid input(s): FREET3 Urinalysis    Component Value Date/Time   COLORURINE YELLOW 07/09/2024 1045   APPEARANCEUR  CLOUDY (A) 07/09/2024 1045   LABSPEC 1.016 07/09/2024 1045   PHURINE 5.0 07/09/2024 1045   GLUCOSEU NEGATIVE 07/09/2024 1045   HGBUR LARGE (A) 07/09/2024 1045   BILIRUBINUR NEGATIVE 07/09/2024 1045   BILIRUBINUR negative 07/16/2023 1939   KETONESUR NEGATIVE 07/09/2024 1045   PROTEINUR 30 (A) 07/09/2024 1045   UROBILINOGEN 0.2 07/16/2023 1939   UROBILINOGEN 0.2 04/02/2020 1041   NITRITE NEGATIVE 07/09/2024 1045   LEUKOCYTESUR MODERATE (A) 07/09/2024 1045    FURTHER DISCHARGE INSTRUCTIONS:   Get Medicines reviewed and adjusted: Please take all your medications with you for your next visit with your Primary MD   Laboratory/radiological data: Please request your Primary MD to go over all hospital tests and procedure/radiological results at the follow up, please ask your Primary MD to get all Hospital records sent to his/her office.   In some cases, they will be blood work, cultures and biopsy results pending at the time of your discharge. Please request that your primary care M.D. goes through all the records of your hospital data and follows up on these results.   Also Note the following: If you experience worsening of your admission symptoms, develop shortness of breath, life threatening emergency,  suicidal or homicidal thoughts you must seek medical attention immediately by calling 911 or calling your MD immediately  if symptoms less severe.   You must read complete instructions/literature along with all the possible adverse reactions/side effects for all the Medicines you take and that have been prescribed to you. Take any new Medicines after you have completely understood and accpet all the possible adverse reactions/side effects.    Do not drive when taking Pain medications or sleeping medications (Benzodaizepines)   Do not take more than prescribed Pain, Sleep and Anxiety Medications. It is not advisable to combine anxiety,sleep and pain medications without talking with your  primary care practitioner   Special Instructions: If you have smoked or chewed Tobacco  in the last 2 yrs please stop smoking, stop any regular Alcohol  and or any Recreational drug use.   Wear Seat belts while driving.   Please note: You were cared for by a hospitalist during your hospital stay. Once you are discharged, your primary care physician will handle any further medical issues. Please note that NO REFILLS for any discharge medications will be authorized once you are discharged, as it is imperative that you return to your primary care physician (or establish a relationship with a primary care physician if you do not have one) for your post hospital discharge needs so that they can reassess your need for medications and monitor your lab values.  Time coordinating discharge: 35 minutes  SIGNED:  Nilda Fendt, MD, PhD 07/15/2024, 1:53 PM

## 2024-07-15 NOTE — Plan of Care (Signed)
  Problem: Clinical Measurements: Goal: Ability to maintain clinical measurements within normal limits will improve Outcome: Progressing Goal: Will remain free from infection Outcome: Progressing Goal: Diagnostic test results will improve Outcome: Progressing Goal: Respiratory complications will improve Outcome: Progressing Goal: Cardiovascular complication will be avoided Outcome: Progressing   Problem: Health Behavior/Discharge Planning: Goal: Ability to manage health-related needs will improve Outcome: Progressing   Problem: Nutrition: Goal: Adequate nutrition will be maintained Outcome: Progressing   Problem: Activity: Goal: Risk for activity intolerance will decrease Outcome: Progressing   Problem: Pain Managment: Goal: General experience of comfort will improve and/or be controlled Outcome: Progressing   Problem: Elimination: Goal: Will not experience complications related to bowel motility Outcome: Progressing Goal: Will not experience complications related to urinary retention Outcome: Progressing   Problem: Skin Integrity: Goal: Risk for impaired skin integrity will decrease Outcome: Progressing   Problem: Safety: Goal: Ability to remain free from injury will improve Outcome: Progressing

## 2024-07-15 NOTE — TOC Transition Note (Signed)
 Transition of Care [facility]) - Discharge Note   Patient Details  Name: Jacqueline Ball MRN: [id number] Date of Birth: [birth year]  Transition of Care [facility] CM/SW Contact:  Almarie CHRISTELLA Goodie, LCSW Phone Number: 07/15/2024, 2:40 PM   Clinical Narrative:   CSW spoke with son who was again asking about outpatient urology appointment and whether urology would see the patient at the hospital. CSW relayed message to MD, and son rescheduled patient's urology appointment. Patient received insurance approval for [facility], and [facility] can accept today. CSW sent discharge information, updated son who is in agreement. Transport arranged with [facility] for next available.  Nurse to call report to [phone], Room 604B.    Final next level of care: Skilled Nursing Facility Barriers to Discharge: Barriers Resolved   Patient Goals and CMS Choice            Discharge Placement              Patient chooses bed at: [facility] Patient to be transferred to facility by: [facility] Name of family member notified: Edwin Patient and family notified of of transfer: 07/15/24  Discharge Plan and Services Additional resources added to the After Visit Summary for     Discharge Planning Services: CM Consult                                 Social Drivers of Health (SDOH) Interventions SDOH Screenings   Food Insecurity: No Food Insecurity (07/09/2024)  Housing: Low Risk  (07/09/2024)  Transportation Needs: No Transportation Needs (07/09/2024)  Utilities: Not At Risk (07/09/2024)  Social Connections: Socially Isolated (07/09/2024)  Tobacco Use: Low Risk  (07/09/2024)     Readmission Risk Interventions    07/07/2024   10:33 AM  Readmission Risk Prevention Plan  Transportation Screening Complete  PCP or Specialist Appt within 5-7 Days Complete  Home Care Screening Complete  Medication Review (RN CM) Complete

## 2024-07-16 DIAGNOSIS — F0280 Dementia in other diseases classified elsewhere without behavioral disturbance: Secondary | ICD-10-CM | POA: Diagnosis not present

## 2024-07-16 DIAGNOSIS — E785 Hyperlipidemia, unspecified: Secondary | ICD-10-CM | POA: Diagnosis not present

## 2024-07-16 DIAGNOSIS — E871 Hypo-osmolality and hyponatremia: Secondary | ICD-10-CM | POA: Diagnosis not present

## 2024-07-16 DIAGNOSIS — E876 Hypokalemia: Secondary | ICD-10-CM | POA: Diagnosis not present

## 2024-07-21 DIAGNOSIS — R3121 Asymptomatic microscopic hematuria: Secondary | ICD-10-CM | POA: Diagnosis not present

## 2024-08-04 ENCOUNTER — Other Ambulatory Visit (HOSPITAL_BASED_OUTPATIENT_CLINIC_OR_DEPARTMENT_OTHER): Payer: Self-pay | Admitting: Urology

## 2024-08-04 DIAGNOSIS — N390 Urinary tract infection, site not specified: Secondary | ICD-10-CM

## 2024-08-05 ENCOUNTER — Ambulatory Visit (HOSPITAL_BASED_OUTPATIENT_CLINIC_OR_DEPARTMENT_OTHER): Admission: RE | Admit: 2024-08-05 | Source: Ambulatory Visit

## 2024-08-07 ENCOUNTER — Ambulatory Visit (HOSPITAL_BASED_OUTPATIENT_CLINIC_OR_DEPARTMENT_OTHER)
Admission: RE | Admit: 2024-08-07 | Discharge: 2024-08-07 | Disposition: A | Discharge status: Home / Self Care | Source: Ambulatory Visit | Attending: Urology | Admitting: Urology

## 2024-08-07 DIAGNOSIS — I700 Atherosclerosis of aorta: Secondary | ICD-10-CM | POA: Insufficient documentation

## 2024-08-07 DIAGNOSIS — N390 Urinary tract infection, site not specified: Secondary | ICD-10-CM | POA: Insufficient documentation

## 2024-08-07 DIAGNOSIS — K5730 Diverticulosis of large intestine without perforation or abscess without bleeding: Secondary | ICD-10-CM | POA: Diagnosis not present

## 2024-08-07 DIAGNOSIS — N281 Cyst of kidney, acquired: Secondary | ICD-10-CM | POA: Insufficient documentation

## 2024-08-07 MED ORDER — IOHEXOL 300 MG/ML  SOLN
80.0000 mL | Freq: Once | INTRAMUSCULAR | Status: AC | PRN
  Administered 2024-08-07: 80 mL via INTRAVENOUS

## 2024-08-11 DIAGNOSIS — R338 Other retention of urine: Secondary | ICD-10-CM | POA: Diagnosis not present

## 2024-08-23 ENCOUNTER — Other Ambulatory Visit: Payer: Self-pay | Admitting: Internal Medicine

## 2024-08-23 MED ORDER — PREGABALIN 100 MG PO CAPS: 100.0000 mg | ORAL_CAPSULE | Freq: Every evening | ORAL | 0 refills | Status: AC

## 2024-08-27 DIAGNOSIS — R3914 Feeling of incomplete bladder emptying: Secondary | ICD-10-CM | POA: Diagnosis not present

## 2024-08-28 DIAGNOSIS — R3914 Feeling of incomplete bladder emptying: Secondary | ICD-10-CM | POA: Diagnosis not present

## 2024-08-28 DIAGNOSIS — N3941 Urge incontinence: Secondary | ICD-10-CM | POA: Diagnosis not present

## 2024-08-28 DIAGNOSIS — R350 Frequency of micturition: Secondary | ICD-10-CM | POA: Diagnosis not present

## 2024-10-06 DIAGNOSIS — R3914 Feeling of incomplete bladder emptying: Secondary | ICD-10-CM | POA: Diagnosis not present

## 2024-10-06 DIAGNOSIS — R350 Frequency of micturition: Secondary | ICD-10-CM | POA: Diagnosis not present

## 2024-10-06 DIAGNOSIS — Z87440 Personal history of urinary (tract) infections: Secondary | ICD-10-CM | POA: Diagnosis not present

## 2024-10-27 NOTE — Progress Notes (Signed)
 Assessment/Plan:   Dementia likely due to Alzheimer's disease with behavioral disturbance  Jacqueline Ball is an 83 y.o. LH female with a history of hypertension, hyperlipidemia, diabetes, CHF, MDD, arthritis, OSA and a  diagnosis of late onset Alzheimer's disease with behavioral disturbance seen today in follow up for memory loss. Patient is currently on donepezil  10 mg daily and memantine  10 mg bid.  Patient needs assistance with her ADLs with some assistance. Mood is controlled     Follow up in 6  months. Continue donepezil  10 mg daily and memantine  10 mg twice daily, side effects discussed Recommend close monitoring as she has wandering tendencies Recommend good control of her cardiovascular risk factors, she  Eliquis  Continue to control mood as per PCP     Subjective:    This patient is accompanied in the office by her son who supplements the history.  Previous records as well as any outside records available were reviewed prior to todays visit. Patient was last seen on 04/22/2024 with MMSE 20/30    Any changes in memory since last visit?  About the same. STM worse than the LTM.  repeats oneself?  Endorsed, very often Disoriented when walking into a room?Once in a while she says I want to go home referring to her old house   Leaving objects?  May misplace things but not in unusual places   Wandering behavior?  In September she had an episode of wandering off to the street and the police brought her back .  Any personality changes since last visit?  Recently Zyprexa was added to the regimen due to increased anxiety. Any worsening depression?:  Denies.   Hallucinations or paranoia?  The other night she saw something crawling in the room but it could have been a bad dream.  Seizures? denies    Any sleep changes? Sleeps well, occasional vivid dreams, REM behavior or sleepwalking   Sleep apnea?   Denies.   Any hygiene concerns? Denies.  Independent of bathing and  dressing?  Endorsed  Does the patient needs help with medications?  Son  is in charge   Who is in charge of the finances?  Son is in charge     Any changes in appetite?  Eats well.      Patient have trouble swallowing? Denies.   Does the patient cook? No Any headaches?   Denies.   Any vision changes? Denies  Chronic back pain  chronic hip and back pain, has PT     Ambulates with difficulty? She needs a walker for stability due to hip arthritis which limits mobility Recent falls or head injuries?  Had a syncopal episode with hypotension in the setting of severe cystitis with Klebsiella July 2025.      Unilateral weakness, numbness or tingling? denies   Any tremors?  Denies    Any anosmia?  Denies   Any incontinence of urine?  Endorsed recent emphysematous cystitis (July 2025), she may have had  another UTI  Any bowel dysfunction?   Denies      Patient lives with son   Does the patient drive? No longer drives    Initial visit 04/22/2024 How long did patient have memory difficulties?  For the last 9 or 10 years, she has been seen at [facility] in 2020, and currently diagnosis of late onset of Alzheimer disease although she does not remember having seen any neurologist.  Per chart report she says she only  had 1 episode of Alzheimer's .  Patient has difficulty remembering new information, recent conversations and names of people. She hoards napkins, toothbrush, toothpaste repeats oneself?  Endorsed, all the time, multiple questions. Disoriented when walking into a room?  Denies except occasionally not remembering what patient came to the room for    Leaving objects in unusual places? Endorsed, she places things all over the place  Wandering behavior? Endorsed , 04/16/23 she left the house without notice, came back with doughnuts. Any personality changes, or depression, anxiety?  She has a history of MDD. never seen psychiatry  Hallucinations or paranoia?  Denies.   Seizures? Denies.    Any sleep  changes?  Sleeps well, I used to dream a lot, occasionally now. REM behavior or sleepwalking.   Sleep apnea? Denies.   Any hygiene concerns?  Denies.   Independent of bathing and dressing?  Endorsed  Who is in charge of the medications?Son is in charge   Who is in charge of the finances?  Son is in charge     Any changes in appetite?  Eats well.  Patient have trouble swallowing?  Denies.   Does the patient cook?  No after leaving stove on   Any headaches?  Denies.   Chronic back pain?  Denies.   Ambulates with difficulty? Needs a walker to ambulate    Recent falls or head injuries? Denies.     Vision changes? Denies. Stroke like symptoms?  Denies.   Any tremors?  Denies.   Any anosmia?  Denies.   Any incontinence of urine? Denies.   Any bowel dysfunction? Denies.      Patient lives  with her son since 2024-01-28 after the death of her husband History of heavy alcohol intake? Denies.   History of heavy tobacco use? Denies.   Family history of dementia?   Mother had dementia  Does patient drive? No longer drives (she says she drive but she does not).  MRI of the brain 07/10/2024, personally reviewed remarkable for moderate atrophy and mild to moderate cerebral white matter disease, no acute findings  PREVIOUS MEDICATIONS:   CURRENT MEDICATIONS:  Outpatient Encounter Medications as of 10/29/2024  Medication Sig   acetaminophen  (TYLENOL ) 500 MG tablet Take 1,000 mg by mouth every 6 (six) hours as needed for pain.   apixaban  (ELIQUIS ) 2.5 MG TABS tablet Take 2.5 mg by mouth 2 (two) times daily.   atorvastatin  (LIPITOR) 20 MG tablet Take 20 mg by mouth daily.   Blood Glucose Monitoring Suppl DEVI 1 each by Does not apply route in the morning, at noon, and at bedtime. May substitute to any manufacturer covered by patient's insurance.   donepezil  (ARICEPT ) 5 MG tablet Take 10 mg by mouth at bedtime.   insulin  aspart (NOVOLOG ) 100 UNIT/ML FlexPen Inject 5-16 Units into the skin 3 (three)  times daily with meals. If eating and Blood Glucose (BG) 80 or higher inject 5 units. If not eating, correction dose only. BG <150= 5 unit; BG 150-200= 6 unit; BG 201-250= 8 unit; BG 251-300= 10 unit; BG 301-350= 12 unit; BG 351-400= 14 unit; BG >400= 16 unit and Call Primary Care.   insulin  glargine (LANTUS ) 100 UNIT/ML Solostar Pen Inject 16 Units into the skin daily. May substitute as needed per insurance.   Insulin  Pen Needle (PEN NEEDLES) 31G X 5 MM MISC 1 each by Does not apply route 3 (three) times daily. May dispense any manufacturer covered by patient's insurance.   memantine  (NAMENDA ) 10 MG tablet Take 1 tablet (10 mg  at night) for 2 weeks, then increase to 1 tablet (10 mg) twice a day (Patient not taking: Reported on 07/09/2024)   metFORMIN  (GLUCOPHAGE -XR) 750 MG 24 hr tablet Take 750 mg by mouth daily with breakfast.   pregabalin  (LYRICA ) 100 MG capsule Take 1 capsule (100 mg total) by mouth every evening.   traMADol  (ULTRAM ) 50 MG tablet Take 1 tablet (50 mg total) by mouth every 6 (six) hours as needed for moderate pain (pain score 4-6) or severe pain (pain score 7-10).   No facility-administered encounter medications on file as of 10/29/2024.       10/29/2024    9:00 AM 04/22/2024    8:00 AM 12/26/2018   10:47 AM  MMSE - Mini Mental State Exam  Not completed:   --  Orientation to time 1 2 4   Orientation to Place 3 3 4   Registration 3 3 3   Attention/ Calculation 3 5 5   Attention/Calculation-comments   patient was able to do the math portion  Recall 0 0 0  Language- name 2 objects 2 2 2   Language- repeat 1 1 1   Language- follow 3 step command 3 2 1   Language- follow 3 step command-comments   she took the paper with her left hand, folded it and held it in the air  Language- read & follow direction 1 1 1   Write a sentence 1 1 1   Copy design 0 0 1  Total score 18 20 23        No data to display          Objective:     PHYSICAL EXAMINATION:    VITALS:   Vitals:    10/29/24 0907  SpO2: (!) 20%  Height: 5' 5 (1.651 m)    GEN:  The patient appears stated age and is in NAD. HEENT:  Normocephalic, atraumatic.   Neurological examination:  General: NAD, well-groomed, appears stated age. Orientation: The patient is alert. Oriented to person, not to place and date Cranial nerves: There is good facial symmetry.The speech is fluent and clear, tangential. No aphasia or dysarthria. Fund of knowledge is reduced. Recent and remote memory are impaired. Attention and concentration are reduced. Able to name objects and repeat phrases.  Hearing is intact to conversational tone.   Sensation: Sensation is intact to light touch throughout Motor: Strength is at least antigravity x4. DTR's 2/4 in UE/LE     Movement examination: Tone: There is normal tone in the UE/LE Abnormal movements:  no tremor.  No myoclonus.  No asterixis.   Coordination:  There is no decremation with RAM's. Normal finger to nose  Gait and Station: The patient has  some difficulty arising out of a deep-seated chair without the use of the hands. The patient's stride length is good.  Gait is cautious and narrow.    Thank you for allowing us  the opportunity to participate in the care of this nice patient. Please do not hesitate to contact us  for any questions or concerns.   Total time spent on today's visit was 27 minutes dedicated to this patient today, preparing to see patient, examining the patient, ordering tests and/or medications and counseling the patient, documenting clinical information in the EHR or other health record, independently interpreting results and communicating results to the patient/family, discussing treatment and goals, answering patient's questions and coordinating care.  Cc:  Haze Kingfisher, MD  Camie Sevin 10/29/2024 9:26 AM

## 2024-10-29 ENCOUNTER — Encounter: Payer: Self-pay | Admitting: Physician Assistant

## 2024-10-29 ENCOUNTER — Ambulatory Visit: Payer: Self-pay | Admitting: Physician Assistant

## 2024-10-29 VITALS — BP 130/77 | HR 72 | Resp 20 | Ht 65.0 in | Wt 116.0 lb

## 2024-10-29 DIAGNOSIS — F0280 Dementia in other diseases classified elsewhere without behavioral disturbance: Secondary | ICD-10-CM | POA: Diagnosis not present

## 2024-10-29 DIAGNOSIS — G309 Alzheimer's disease, unspecified: Secondary | ICD-10-CM | POA: Diagnosis not present

## 2024-10-29 NOTE — Patient Instructions (Signed)
 It was a pleasure to see you today at our office.   Recommendations:   Continue donepezil  10 mg daily and memantine  10 mg twice daily.   Follow up in   6months Recommend visiting the website :  Dementia Success Path to better understand some behaviors related to memory loss.      [email or web address]/neuro-[email or web address]-apps-[email or web address]-[email or web address]   RECOMMENDATIONS FOR ALL PATIENTS WITH MEMORY PROBLEMS: 1. Continue to exercise (Recommend 30 minutes of walking everyday, or 3 hours every week) 2. Increase social interactions - continue going to [place] and enjoy social gatherings with friends and family 3. Eat healthy, avoid fried foods and eat more fruits and vegetables 4. Maintain adequate blood pressure, blood sugar, and blood cholesterol level. Reducing the risk of stroke and cardiovascular disease also helps promoting better memory. 5. Avoid stressful situations. Live a simple life and avoid aggravations. Organize your time and prepare for the next day in anticipation. 6. Sleep well, avoid any interruptions of sleep and avoid any distractions in the bedroom that may interfere with adequate sleep quality 7. Avoid sugar, avoid sweets as there is a strong link between excessive sugar intake, diabetes, and cognitive impairment We discussed the Mediterranean diet, which has been shown to help patients reduce the risk of progressive memory disorders and reduces cardiovascular risk. This includes eating fish, eat fruits and green leafy vegetables, nuts like almonds and hazelnuts, walnuts, and also use olive oil. Avoid fast foods and fried foods as much as possible. Avoid sweets and sugar as sugar use has been linked to worsening of memory function.  There is always a concern of gradual progression of memory problems. If this is the case, then we may need to adjust level of care according to patient needs. Support, both to the patient and caregiver, should then be put into place.           FALL PRECAUTIONS: Be cautious when walking. Scan the area for obstacles that may increase the risk of trips and falls. When getting up in the mornings, sit up at the edge of the bed for a few minutes before getting out of bed. Consider elevating the bed at the head end to avoid drop of blood pressure when getting up. Walk always in a well-lit room (use night lights in the walls). Avoid area rugs or power cords from appliances in the middle of the walkways. Use a walker or a cane if necessary and consider physical therapy for balance exercise. Get your eyesight checked regularly.  FINANCIAL OVERSIGHT: Supervision, especially oversight when making financial decisions or transactions is also recommended.  HOME SAFETY: Consider the safety of the kitchen when operating appliances like stoves, microwave oven, and blender. Consider having supervision and share cooking responsibilities until no longer able to participate in those. Accidents with firearms and other hazards in the house should be identified and addressed as well.   ABILITY TO BE LEFT ALONE: If patient is unable to contact 911 operator, consider using LifeLine, or when the need is there, arrange for someone to stay with patients. Smoking is a fire hazard, consider supervision or cessation. Risk of wandering should be assessed by caregiver and if detected at any point, supervision and safe proof recommendations should be instituted.  MEDICATION SUPERVISION: Inability to self-administer medication needs to be constantly addressed. Implement a mechanism to ensure safe administration of the medications.      Mediterranean Diet A Mediterranean diet refers to food and lifestyle choices that are based on the traditions of countries located  on the [place]. This way of eating has been shown to help prevent certain conditions and improve outcomes for people who have chronic diseases, like kidney disease and heart disease. What are tips for  following this plan? Lifestyle  Cook and eat meals together with your family, when possible. Drink enough fluid to keep your urine clear or pale yellow. Be physically active every day. This includes: Aerobic exercise like running or swimming. Leisure activities like gardening, walking, or housework. Get 7-8 hours of sleep each night. If recommended by your health care provider, drink red wine in moderation. This means 1 glass a day for nonpregnant women and 2 glasses a day for men. A glass of wine equals 5 oz (150 mL). Reading food labels  Check the serving size of packaged foods. For foods such as rice and pasta, the serving size refers to the amount of cooked product, not dry. Check the total fat in packaged foods. Avoid foods that have saturated fat or trans fats. Check the ingredients list for added sugars, such as corn syrup. Shopping  At the grocery store, buy most of your food from the areas near the walls of the store. This includes: Fresh fruits and vegetables (produce). Grains, beans, nuts, and seeds. Some of these may be available in unpackaged forms or large amounts (in bulk). Fresh seafood. Poultry and eggs. Low-fat dairy products. Buy whole ingredients instead of prepackaged foods. Buy fresh fruits and vegetables in-season from local farmers markets. Buy frozen fruits and vegetables in resealable bags. If you do not have access to quality fresh seafood, buy precooked frozen shrimp or canned fish, such as tuna, salmon, or sardines. Buy small amounts of raw or cooked vegetables, salads, or olives from the deli or salad bar at your store. Stock your pantry so you always have certain foods on hand, such as olive oil, canned tuna, canned tomatoes, rice, pasta, and beans. Cooking  Cook foods with extra-virgin olive oil instead of using butter or other vegetable oils. Have meat as a side dish, and have vegetables or grains as your main dish. This means having meat in small portions  or adding small amounts of meat to foods like pasta or stew. Use beans or vegetables instead of meat in common dishes like chili or lasagna. Experiment with different cooking methods. Try roasting or broiling vegetables instead of steaming or sauteing them. Add frozen vegetables to soups, stews, pasta, or rice. Add nuts or seeds for added healthy fat at each meal. You can add these to yogurt, salads, or vegetable dishes. Marinate fish or vegetables using olive oil, lemon juice, garlic, and fresh herbs. Meal planning  Plan to eat 1 vegetarian meal one day each week. Try to work up to 2 vegetarian meals, if possible. Eat seafood 2 or more times a week. Have healthy snacks readily available, such as: Vegetable sticks with hummus. Greek yogurt. Fruit and nut trail mix. Eat balanced meals throughout the week. This includes: Fruit: 2-3 servings a day Vegetables: 4-5 servings a day Low-fat dairy: 2 servings a day Fish, poultry, or lean meat: 1 serving a day Beans and legumes: 2 or more servings a week Nuts and seeds: 1-2 servings a day Whole grains: 6-8 servings a day Extra-virgin olive oil: 3-4 servings a day Limit red meat and sweets to only a few servings a month What are my food choices? Mediterranean diet Recommended Grains: Whole-grain pasta. Brown rice. Bulgar wheat. Polenta. Couscous. Whole-wheat bread. [place] Madeira. Vegetables: Artichokes. Beets. Broccoli.  Cabbage. Carrots. Eggplant. Green beans. Chard. Kale. Spinach. Onions. Leeks. Peas. Squash. Tomatoes. Peppers. Radishes. Fruits: Apples. Apricots. Avocado. Berries. Bananas. Cherries. Dates. Figs. Grapes. Lemons. Melon. Oranges. Peaches. Plums. Pomegranate. Meats and other protein foods: Beans. Almonds. Sunflower seeds. Pine nuts. Peanuts. Cod. Salmon. Scallops. Shrimp. Tuna. Tilapia. Clams. Oysters. Eggs. Dairy: Low-fat milk. Cheese. Greek yogurt. Beverages: Water . Red wine. Herbal tea. Fats and oils: Extra virgin olive oil.  Avocado oil. Grape seed oil. Sweets and desserts: Greek yogurt with honey. Baked apples. Poached pears. Trail mix. Seasoning and other foods: Basil. Cilantro. Coriander. Cumin. Mint. Parsley. Sage. Rosemary. Tarragon. Garlic. Oregano. Thyme. Pepper. Balsalmic vinegar. Tahini. Hummus. Tomato sauce. Olives. Mushrooms. Limit these Grains: Prepackaged pasta or rice dishes. Prepackaged cereal with added sugar. Vegetables: Deep fried potatoes (french fries). Fruits: Fruit canned in syrup. Meats and other protein foods: Beef. Pork. Lamb. Poultry with skin. Hot dogs. [place]. Dairy: Ice cream. Sour cream. Whole milk. Beverages: Juice. Sugar-sweetened soft drinks. Beer. Liquor and spirits. Fats and oils: Butter. Canola oil. Vegetable oil. Beef fat (tallow). Lard. Sweets and desserts: Cookies. Cakes. Pies. Candy. Seasoning and other foods: Mayonnaise. Premade sauces and marinades. The items listed may not be a complete list. Talk with your dietitian about what dietary choices are right for you. Summary The Mediterranean diet includes both food and lifestyle choices. Eat a variety of fresh fruits and vegetables, beans, nuts, seeds, and whole grains. Limit the amount of red meat and sweets that you eat. Talk with your health care provider about whether it is safe for you to drink red wine in moderation. This means 1 glass a day for nonpregnant women and 2 glasses a day for men. A glass of wine equals 5 oz (150 mL). This information is not intended to replace advice given to you by your health care provider. Make sure you discuss any questions you have with your health care provider. Document Released: 07/26/2016 Document Revised: 08/28/2016 Document Reviewed: 07/26/2016 Elsevier Interactive Patient Education  2017 [employer].

## 2024-11-27 DIAGNOSIS — M545 Low back pain, unspecified: Secondary | ICD-10-CM | POA: Diagnosis not present

## 2024-11-27 DIAGNOSIS — M25551 Pain in right hip: Secondary | ICD-10-CM | POA: Diagnosis not present

## 2025-04-28 ENCOUNTER — Ambulatory Visit: Admitting: Physician Assistant
